# Patient Record
Sex: Female | Born: 1955
Health system: Southern US, Community
[De-identification: ages and names within clinical notes are randomized; demographics above are authoritative.]

## PROBLEM LIST (undated history)

## (undated) DIAGNOSIS — C801 Malignant (primary) neoplasm, unspecified: Secondary | ICD-10-CM

## (undated) DIAGNOSIS — M75102 Unspecified rotator cuff tear or rupture of left shoulder, not specified as traumatic: Secondary | ICD-10-CM

## (undated) DIAGNOSIS — R112 Nausea with vomiting, unspecified: Secondary | ICD-10-CM

## (undated) DIAGNOSIS — Z808 Family history of malignant neoplasm of other organs or systems: Secondary | ICD-10-CM

## (undated) DIAGNOSIS — T4145XA Adverse effect of unspecified anesthetic, initial encounter: Secondary | ICD-10-CM

## (undated) DIAGNOSIS — R011 Cardiac murmur, unspecified: Secondary | ICD-10-CM

## (undated) DIAGNOSIS — G8929 Other chronic pain: Secondary | ICD-10-CM

## (undated) DIAGNOSIS — K219 Gastro-esophageal reflux disease without esophagitis: Secondary | ICD-10-CM

## (undated) DIAGNOSIS — R42 Dizziness and giddiness: Secondary | ICD-10-CM

## (undated) DIAGNOSIS — Z8489 Family history of other specified conditions: Secondary | ICD-10-CM

## (undated) DIAGNOSIS — Z8719 Personal history of other diseases of the digestive system: Secondary | ICD-10-CM

## (undated) DIAGNOSIS — E785 Hyperlipidemia, unspecified: Secondary | ICD-10-CM

## (undated) DIAGNOSIS — J302 Other seasonal allergic rhinitis: Secondary | ICD-10-CM

## (undated) DIAGNOSIS — Z800 Family history of malignant neoplasm of digestive organs: Secondary | ICD-10-CM

## (undated) DIAGNOSIS — Z9889 Other specified postprocedural states: Secondary | ICD-10-CM

## (undated) DIAGNOSIS — I10 Essential (primary) hypertension: Secondary | ICD-10-CM

## (undated) DIAGNOSIS — M549 Dorsalgia, unspecified: Secondary | ICD-10-CM

## (undated) DIAGNOSIS — M1990 Unspecified osteoarthritis, unspecified site: Secondary | ICD-10-CM

## (undated) DIAGNOSIS — Z1379 Encounter for other screening for genetic and chromosomal anomalies: Principal | ICD-10-CM

## (undated) DIAGNOSIS — T8859XA Other complications of anesthesia, initial encounter: Secondary | ICD-10-CM

## (undated) DIAGNOSIS — D0511 Intraductal carcinoma in situ of right breast: Principal | ICD-10-CM

## (undated) DIAGNOSIS — Z8041 Family history of malignant neoplasm of ovary: Secondary | ICD-10-CM

## (undated) HISTORY — DX: Gastro-esophageal reflux disease without esophagitis: K21.9

## (undated) HISTORY — DX: Unspecified rotator cuff tear or rupture of left shoulder, not specified as traumatic: M75.102

## (undated) HISTORY — DX: Family history of malignant neoplasm of digestive organs: Z80.0

## (undated) HISTORY — DX: Intraductal carcinoma in situ of right breast: D05.11

## (undated) HISTORY — DX: Encounter for other screening for genetic and chromosomal anomalies: Z13.79

## (undated) HISTORY — DX: Other specified postprocedural states: Z98.890

## (undated) HISTORY — PX: BACK SURGERY: SHX140

## (undated) HISTORY — PX: HERNIA REPAIR: SHX51

## (undated) HISTORY — PX: MELANOMA EXCISION: SHX5266

## (undated) HISTORY — PX: HAND SURGERY: SHX662

## (undated) HISTORY — DX: Family history of malignant neoplasm of other organs or systems: Z80.8

## (undated) HISTORY — PX: EYE SURGERY: SHX253

## (undated) HISTORY — DX: Family history of malignant neoplasm of ovary: Z80.41

## (undated) HISTORY — DX: Hyperlipidemia, unspecified: E78.5

## (undated) HISTORY — PX: ABDOMINAL HYSTERECTOMY: SHX81

## (undated) HISTORY — PX: FOOT SURGERY: SHX648

## (undated) HISTORY — PX: BREAST SURGERY: SHX581

## (undated) HISTORY — PX: COLONOSCOPY: SHX174

---

## 1984-09-24 HISTORY — PX: TUBAL LIGATION: SHX77

## 1998-12-26 ENCOUNTER — Other Ambulatory Visit: Admission: RE | Admit: 1998-12-26 | Discharge: 1998-12-26 | Payer: Self-pay | Admitting: Obstetrics and Gynecology

## 1999-07-03 ENCOUNTER — Encounter: Payer: Self-pay | Admitting: Family Medicine

## 1999-07-03 ENCOUNTER — Ambulatory Visit (HOSPITAL_COMMUNITY): Admission: RE | Admit: 1999-07-03 | Discharge: 1999-07-03 | Payer: Self-pay | Admitting: Family Medicine

## 1999-12-28 ENCOUNTER — Other Ambulatory Visit: Admission: RE | Admit: 1999-12-28 | Discharge: 1999-12-28 | Payer: Self-pay | Admitting: Obstetrics and Gynecology

## 2000-05-31 ENCOUNTER — Encounter: Admission: RE | Admit: 2000-05-31 | Discharge: 2000-05-31 | Payer: Self-pay | Admitting: Obstetrics and Gynecology

## 2000-05-31 ENCOUNTER — Encounter: Payer: Self-pay | Admitting: Obstetrics and Gynecology

## 2000-12-30 ENCOUNTER — Other Ambulatory Visit: Admission: RE | Admit: 2000-12-30 | Discharge: 2000-12-30 | Payer: Self-pay | Admitting: Obstetrics and Gynecology

## 2001-06-09 ENCOUNTER — Encounter: Payer: Self-pay | Admitting: Obstetrics and Gynecology

## 2001-06-09 ENCOUNTER — Encounter: Admission: RE | Admit: 2001-06-09 | Discharge: 2001-06-09 | Payer: Self-pay | Admitting: Obstetrics and Gynecology

## 2002-01-19 ENCOUNTER — Other Ambulatory Visit: Admission: RE | Admit: 2002-01-19 | Discharge: 2002-01-19 | Payer: Self-pay | Admitting: Obstetrics and Gynecology

## 2002-06-12 ENCOUNTER — Encounter: Admission: RE | Admit: 2002-06-12 | Discharge: 2002-06-12 | Payer: Self-pay | Admitting: Obstetrics and Gynecology

## 2002-06-12 ENCOUNTER — Encounter: Payer: Self-pay | Admitting: Obstetrics and Gynecology

## 2002-11-06 ENCOUNTER — Encounter: Admission: RE | Admit: 2002-11-06 | Discharge: 2002-11-06 | Payer: Self-pay | Admitting: Obstetrics and Gynecology

## 2002-11-06 ENCOUNTER — Encounter: Payer: Self-pay | Admitting: Obstetrics and Gynecology

## 2003-06-22 ENCOUNTER — Encounter: Admission: RE | Admit: 2003-06-22 | Discharge: 2003-06-22 | Payer: Self-pay | Admitting: Obstetrics and Gynecology

## 2003-06-22 ENCOUNTER — Encounter: Payer: Self-pay | Admitting: Obstetrics and Gynecology

## 2004-06-16 ENCOUNTER — Ambulatory Visit (HOSPITAL_COMMUNITY): Admission: RE | Admit: 2004-06-16 | Discharge: 2004-06-16 | Payer: Self-pay | Admitting: Gastroenterology

## 2006-06-11 ENCOUNTER — Other Ambulatory Visit: Admission: RE | Admit: 2006-06-11 | Discharge: 2006-06-11 | Payer: Self-pay | Admitting: Obstetrics & Gynecology

## 2007-06-17 HISTORY — PX: OTHER SURGICAL HISTORY: SHX169

## 2007-07-03 ENCOUNTER — Other Ambulatory Visit: Admission: RE | Admit: 2007-07-03 | Discharge: 2007-07-03 | Payer: Self-pay | Admitting: Obstetrics & Gynecology

## 2008-07-07 ENCOUNTER — Other Ambulatory Visit: Admission: RE | Admit: 2008-07-07 | Discharge: 2008-07-07 | Payer: Self-pay | Admitting: Obstetrics & Gynecology

## 2008-07-20 ENCOUNTER — Emergency Department (HOSPITAL_BASED_OUTPATIENT_CLINIC_OR_DEPARTMENT_OTHER): Admission: EM | Admit: 2008-07-20 | Discharge: 2008-07-20 | Payer: Self-pay | Admitting: Emergency Medicine

## 2009-07-08 ENCOUNTER — Encounter: Admission: RE | Admit: 2009-07-08 | Discharge: 2009-07-08 | Payer: Self-pay | Admitting: Family Medicine

## 2010-01-04 ENCOUNTER — Encounter: Admission: RE | Admit: 2010-01-04 | Discharge: 2010-01-04 | Payer: Self-pay | Admitting: Gastroenterology

## 2010-04-24 DIAGNOSIS — C801 Malignant (primary) neoplasm, unspecified: Secondary | ICD-10-CM

## 2010-04-24 HISTORY — DX: Malignant (primary) neoplasm, unspecified: C80.1

## 2011-02-09 NOTE — Op Note (Signed)
Melissa Mcclure, Melissa Mcclure                  ACCOUNT NO.:  [id number]   MEDICAL RECORD NO.:  [id number]          PATIENT TYPE:  AMB   LOCATION:  ENDO                         FACILITY:  [facility]   PHYSICIAN:  Petra Kuba, M.D.    DATE OF BIRTH:  August 11, 1956   DATE OF PROCEDURE:  06/16/2004  DATE OF DISCHARGE:                                 OPERATIVE REPORT   PROCEDURE:  Colonoscopy.   INDICATION:  Family history of colon cancer, due for colonic screening.  Consent was signed after risks, benefits, methods, options thoroughly  discussed in the office.   MEDICINES USED:  1.  Demerol 80.  2.  Versed 8.   DESCRIPTION OF PROCEDURE:  Rectal inspection was pertinent for small  external hemorrhoids.  Digital exam was negative.  Pediatric video  adjustable colonoscope was inserted and with lots of difficulty due to a  long, looping colon which required rolling her first on her back, then on  her right side, and back to her left side, with various abdominal pressures,  we were finally able to get to the cecum which was identified by the  appendiceal orifice and the ileocecal valve.  No abnormalities were seen on  insertion, but the prep was fairly poor.  We did advance a very short ways  into the terminal ileum with quick evaluation.  TI ws normal.  Scope was  slowly withdrawn.  Lots of washing and suctioning was done, but there was  some stool adherent to the wall, right greater than left which could not be  washed off but no obvious underlying mass, lesions, or polyps were seen as  we slowly withdrawn back to the rectum.  There was some stool on the left  side which could be washed in different positions but could not be  suctioned, but no abnormalities were seen.  Once back in the rectum,  anorectal pull-through and retroflexion confirmed some small hemorrhoids.  The scope was reinserted a short ways up the left side of the colon; air was  suctioned and the scope removed.  The patient tolerated the  procedure well.  There was no obvious immediate complication.   ENDOSCOPIC DIAGNOSES:  1.  Internal/external hemorrhoids.  2.  Long, looping colon.  3.  Left greater than right but no obvious lesions plan.   PLAN:  1.  Yearly rectals and guaiacs per either Dr. Dayton Scrape or gynecology.  2.  Happy to see back p.r.n..  3.  Repeat screening in 5 years using a more aggressive prep like mag      citrate and GoLYTELY.  Might even consider a virtual colonoscopy one      time at some point if the procedure is widely available and acceptable      by insurance.      MEM/MEDQ  D:  06/16/2004  T:  06/16/2004  Job:  [id number]   cc:   Meredith Staggers, M.D.  [address]  [place]  [place] [address]  Fax: [phone]

## 2012-08-11 ENCOUNTER — Other Ambulatory Visit: Payer: Self-pay | Admitting: Neurosurgery

## 2012-08-11 DIAGNOSIS — M48061 Spinal stenosis, lumbar region without neurogenic claudication: Secondary | ICD-10-CM

## 2012-08-11 DIAGNOSIS — M5126 Other intervertebral disc displacement, lumbar region: Secondary | ICD-10-CM

## 2012-08-13 ENCOUNTER — Other Ambulatory Visit: Payer: Self-pay | Admitting: Neurosurgery

## 2012-08-13 DIAGNOSIS — M810 Age-related osteoporosis without current pathological fracture: Secondary | ICD-10-CM

## 2012-08-16 ENCOUNTER — Ambulatory Visit
Admission: RE | Admit: 2012-08-16 | Discharge: 2012-08-16 | Disposition: A | Discharge status: Home / Self Care | Payer: BC Managed Care – PPO | Source: Ambulatory Visit | Attending: Neurosurgery | Admitting: Neurosurgery

## 2012-08-16 DIAGNOSIS — M48061 Spinal stenosis, lumbar region without neurogenic claudication: Secondary | ICD-10-CM

## 2012-08-16 DIAGNOSIS — M5126 Other intervertebral disc displacement, lumbar region: Secondary | ICD-10-CM

## 2012-08-20 ENCOUNTER — Ambulatory Visit
Admission: RE | Admit: 2012-08-20 | Discharge: 2012-08-20 | Disposition: A | Discharge status: Home / Self Care | Payer: BC Managed Care – PPO | Source: Ambulatory Visit | Attending: Neurosurgery | Admitting: Neurosurgery

## 2012-08-20 DIAGNOSIS — M810 Age-related osteoporosis without current pathological fracture: Secondary | ICD-10-CM

## 2012-09-22 ENCOUNTER — Other Ambulatory Visit: Payer: Self-pay | Admitting: Neurosurgery

## 2013-04-06 ENCOUNTER — Encounter (HOSPITAL_COMMUNITY): Payer: Self-pay | Admitting: Respiratory Therapy

## 2013-04-08 ENCOUNTER — Other Ambulatory Visit: Payer: Self-pay | Admitting: Neurosurgery

## 2013-04-09 ENCOUNTER — Encounter (HOSPITAL_COMMUNITY)
Admission: RE | Admit: 2013-04-09 | Discharge: 2013-04-09 | Disposition: A | Discharge status: Home / Self Care | Payer: BC Managed Care – PPO | Source: Ambulatory Visit | Attending: Neurosurgery | Admitting: Neurosurgery

## 2013-04-09 ENCOUNTER — Encounter (HOSPITAL_COMMUNITY): Payer: Self-pay

## 2013-04-09 ENCOUNTER — Encounter (HOSPITAL_COMMUNITY)
Admission: RE | Admit: 2013-04-09 | Discharge: 2013-04-09 | Disposition: A | Discharge status: Home / Self Care | Payer: BC Managed Care – PPO | Source: Ambulatory Visit | Attending: Anesthesiology | Admitting: Anesthesiology

## 2013-04-09 DIAGNOSIS — Z01818 Encounter for other preprocedural examination: Secondary | ICD-10-CM | POA: Insufficient documentation

## 2013-04-09 DIAGNOSIS — Z01812 Encounter for preprocedural laboratory examination: Secondary | ICD-10-CM | POA: Insufficient documentation

## 2013-04-09 DIAGNOSIS — Z01811 Encounter for preprocedural respiratory examination: Secondary | ICD-10-CM | POA: Insufficient documentation

## 2013-04-09 DIAGNOSIS — Z01810 Encounter for preprocedural cardiovascular examination: Secondary | ICD-10-CM | POA: Insufficient documentation

## 2013-04-09 HISTORY — DX: Essential (primary) hypertension: I10

## 2013-04-09 HISTORY — DX: Other complications of anesthesia, initial encounter: T88.59XA

## 2013-04-09 HISTORY — DX: Personal history of other diseases of the digestive system: Z87.19

## 2013-04-09 HISTORY — DX: Gastro-esophageal reflux disease without esophagitis: K21.9

## 2013-04-09 HISTORY — DX: Malignant (primary) neoplasm, unspecified: C80.1

## 2013-04-09 HISTORY — DX: Adverse effect of unspecified anesthetic, initial encounter: T41.45XA

## 2013-04-09 HISTORY — DX: Other specified postprocedural states: Z98.890

## 2013-04-09 HISTORY — DX: Nausea with vomiting, unspecified: R11.2

## 2013-04-09 HISTORY — DX: Cardiac murmur, unspecified: R01.1

## 2013-04-09 HISTORY — DX: Dizziness and giddiness: R42

## 2013-04-09 LAB — BASIC METABOLIC PANEL
BUN: 25 mg/dL — ABNORMAL HIGH (ref 6–23)
Chloride: 98 mEq/L (ref 96–112)
GFR calc Af Amer: 72 mL/min — ABNORMAL LOW (ref >90)
Potassium: 3.6 mEq/L (ref 3.5–5.1)
Sodium: 136 mEq/L (ref 135–145)

## 2013-04-09 LAB — SURGICAL PCR SCREEN: MRSA, PCR: NEGATIVE

## 2013-04-09 LAB — ABO/RH: ABO/RH(D): O POS

## 2013-04-09 LAB — CBC
HCT: 36.3 % (ref 36.0–46.0)
Hemoglobin: 12.8 g/dL (ref 12.0–15.0)
MCHC: 35.3 g/dL (ref 30.0–36.0)
MCV: 90.3 fL (ref 78.0–100.0)
RBC: 4.02 MIL/uL (ref 3.87–5.11)
RDW: 12.2 % (ref 11.5–15.5)
WBC: 6.2 10*3/uL (ref 4.0–10.5)

## 2013-04-09 LAB — TYPE AND SCREEN: Antibody Screen: NEGATIVE

## 2013-04-09 NOTE — Pre-Procedure Instructions (Addendum)
Melissa Mcclure  04/09/2013   Your procedure is scheduled on:  04/17/13  Report to [facility] at 530 AM.  Call this number if you have problems the morning of surgery: [phone]   Remember:   Do not eat food or drink liquids after midnight.   Take these medicines the morning of surgery with A SIP OF WATER:pepcid, /protonix, pain med, progesterone             STOP aspirin, meloxicam, vit e, astragalus   Do not wear jewelry, make-up or nail polish.  Do not wear lotions, powders, or perfumes. You may wear deodorant.  Do not shave 48 hours prior to surgery. Men may shave face and neck.  Do not bring valuables to the hospital.  [facility] is not responsible                   for any belongings or valuables.  Contacts, dentures or bridgework may not be worn into surgery.  Leave suitcase in the car. After surgery it may be brought to your room.  For patients admitted to the hospital, checkout time is 11:00 AM the day of  discharge.   Patients discharged the day of surgery will not be allowed to drive  home.  Name and phone number of your driver:   Special Instructions: Shower using CHG 2 nights before surgery and the night before surgery.  If you shower the day of surgery use CHG.  Use special wash - you have one bottle of CHG for all showers.  You should use approximately 1/3 of the bottle for each shower.   Please read over the following fact sheets that you were given: Pain Booklet, Coughing and Deep Breathing, Blood Transfusion Information, MRSA Information and Surgical Site Infection Prevention

## 2013-04-09 NOTE — Progress Notes (Signed)
Office called to release orders 

## 2013-04-13 NOTE — Progress Notes (Signed)
Pt called today asking if it was ok to keep her Vivelle Dot patch on the day of surgery. Told her that she could keep it on.

## 2013-04-16 MED ORDER — CEFAZOLIN SODIUM-DEXTROSE 2-3 GM-% IV SOLR
2.0000 g | INTRAVENOUS | Status: AC
  Administered 2013-04-17 (×2): 2 g via INTRAVENOUS
  Filled 2013-04-16: qty 50

## 2013-04-16 NOTE — H&P (Signed)
Melissa Mcclure  #[id number] DOB:  1955-12-31 11/26/2012:     Melissa Mcclure comes in today for recheck and to discuss her back problems and proposed surgery.  We recommended that she undergo anterolateral lumbar interbody fusion L2-3, L3-4, L4-5 with posterior interbody fusion L5-S1 with percutaneous pedicle screw fixation for scoliosis and severe right greater than left lower extremity pain.  She says she is hurting badly and wants to get relief of her pain.  She has Grade 1 spondylolisthesis at L5-S1 due to spondylolysis and there is spondylolisthesis L4-5 and L3-4 and mild retrolisthesis of L1 and L2 with scoliotic curvature.  I went over the treatment options with a detailed discussion of the proposed surgery as well as its attendant risks and benefits.  She wishes to proceed.  This is being schedule for 04/03/2013 based on personal need to wait until July.           [name]. [name], M.D./gde   [name]  #[id number]  DOB:  October 27, 1955  09/08/2012:  Melissa Mcclure comes in today to review her lumbar MRI and radiographs and also bone density that was acceptable and within normal range.  The lumbar MRI and radiographs were reviewed which show that she has significant scoliosis affecting L2-3, L3-4, L4-5 and L5-S1 levels.  I have recommended that she undergo a right-sided anterolateral decompression and fusion approach L2-3, L3-4 and L4-5 levels with L5-S1 decompression and fusion with percutaneous pedicle screw fixation for severe scoliosis and low back and right greater than left lower extremity pain.  She has significant foraminal stenosis at the L5-S1 level with marked degeneration at this level as well.  I believe this needs to be addressed at the same time.  She wishes to go ahead with surgery.  She will be fitted for an LSO brace and surgery is to be scheduled in February.  Risks and benefits were discussed in great detail and models were reviewed.  The patient understands and wishes to proceed.          [name]. [name],  M.D./sv     NEUROSURGICAL CONSULTATION   [name]   DOB:  [birth year] #[id number]    July 28, 2012   HISTORY:     Melissa Mcclure is a 57 year old woman with back and bilateral leg pain, right greater than left.  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  She says this has been going on for years, but has gotten worse recently and is now increasingly a problem.  She saw Dr. Shelle Iron who referred her to [name], and then went for a second opinion and [name] in 2008.  She has been taking Mobic 15 mg. every other day, Hydrocodone 7.5/325 one at night as needed, Tylenol 500 mg. two as needed.  She had epidural steroid injections twice this year which did not give her any relief.    REVIEW OF SYSTEMS:   A detailed Review of Systems sheet was reviewed with the patient.  Pertinent positives include under cardiovascular - she notes high blood pressure, under musculoskeletal - she notes back pain and leg pain.  All other systems are negative; this includes Constitutional symptoms, Eyes, Ears, nose, mouth, throat, Endocrine, Respiratory, Gastrointestinal, Genitourinary, Integumentary & Breast, Neurologic, Psychiatric, Hematologic/Lymphatic, Allergic/Immunologic.    PAST MEDICAL HISTORY:      Current Medical Conditions:    She has a history of hypertension.  Prior Operations and Hospitalizations:   She has a history of Stage IV melanoma treated at [facility] in 2008.      Medications and Allergies:  Medications are Meloxicam 15 mg. one every other day, Vivelle Dot 0.0375 mg. twice weekly, Pantoprazole 40 mg. qd, Metoprolol 50 mg. qd, Progesterone 100 mg. qd, Meclizine 25 mg. qd prn, Hydrocodone 7.5/325 po q6-8h prn, Vitamin B12 1000 mcg. qd, Calcium and Magnesium vitamin qd, Alive multivitamin qd, Astragalus 500 mg. qd, Zyrtec qd, Vitamin E 400 qd, Vitamin D3 5000 IU qd, Vitamin C 1000 with rose hips qd, and low  dose Aspirin 81 mg. qd.  DRUG ALLERGIES - SULFA which causes her to break out.      Height and Weight:     She is 5'4 " tall, 134 lbs.    SOCIAL HISTORY:    She denies tobacco or drug use.  She is a rare drinker of alcoholic beverages.    DIAGNOSTIC STUDIES:   She had plain radiographs in the office which show L5-S1 spondylolisthesis and severe scoliosis of the thoracolumbar spine.  Apex of the curvature at L3-4.    MRI had been performed in January of 2013 which shows Grade I spondylolisthesis of L5 on S1 due to bilateral spondylolysis. At L4-5 there is positioning of L4 on L5.  At L3-4 there is again retrolisthesis of L3 on L4 with small to moderate right lateral and posterolateral protrusion. At L2-3 there is retrolisthesis of L2 on L3. At L1-2 there is mild retrolisthesis of L1 on L2 without stenosis or nerve root displacement.    PHYSICAL EXAMINATION:      General Appearance:   On examination today, [name] is a pleasant and cooperative woman in no acute distress.      Blood Pressure, Pulse:     Blood pressure is 140/88.  Heart rate is 72 and regular.  Respiratory rate is 18.      HEENT - normocephalic, atraumatic.  The pupils are equal, round and reactive to light.  The extraocular muscles are intact.  Sclerae - white.  Conjunctiva - pink.  Oropharynx benign.  Uvula midline.     Neck - there are no masses, meningismus, deformities, tracheal deviation, jugular vein distention or carotid bruits.  There is normal cervical range of motion.  Spurlings' test is negative without reproducible radicular pain turning the patient's head to either side.  Lhermitte's sign is not present with axial compression.      Respiratory - there is normal respiratory effort with good intercostal function.  Lungs are clear to auscultation.  There are no rales, rhonchi or wheezes.      Cardiovascular - the heart has regular rate and rhythm to auscultation.  No murmurs are appreciated.  There is no extremity  edema, cyanosis or clubbing.  There are palpable pedal pulses.      Abdomen - soft, nontender, no hepatosplenomegaly appreciated or masses.  There are active bowel sounds.  No guarding or rebound.      Musculoskeletal Examination - she has a palpable dextroconvex scoliotic curvature in the thoracic spine. She has sciatic notch discomfort on the right. She is able to stand on her heels and toes, and able to bend to touch her toes.  Straight leg raising is positive for low back pain.    NEUROLOGICAL EXAMINATION: The patient is oriented to time, person and place and has good recall of both recent and remote memory with normal attention span and concentration.  The patient  speaks with clear and fluent speech and exhibits normal language function and appropriate fund of knowledge.      Cranial Nerve Examination - pupils are equal, round and reactive to light.  Extraocular movements are full.  Visual fields are full to confrontational testing.  Facial sensation and facial movement are symmetric and intact.  Hearing is intact to finger rub.  Palate is upgoing.  Shoulder shrug is symmetric.  Tongue protrudes in the midline.      Motor Examination - motor strength is 5/5 in the bilateral deltoids, biceps, triceps, handgrips, wrist extensors, interosseous.  In the lower extremities motor strength is 5/5 in hip flexion, extension, quadriceps, hamstrings, plantar flexion, dorsiflexion and extensor hallucis longus.      Sensory Examination - normal to light touch and pinprick sensation in the upper and lower extremities.     Deep Tendon Reflexes - 2 in the biceps, triceps, and brachioradialis, 2 in the knees, 2 in the ankles.  The great toes are downgoing to plantar stimulation.      Cerebellar Examination - normal coordination in upper and lower extremities and normal rapid alternating movements.  Romberg test is negative.    IMPRESSION AND RECOMMENDATIONS: Daielle Melcher is a 57 year old woman with multilevel  lumbar stenosis and spondylolisthesis with scoliosis. I have recommended we get a new MRI of the lumbar spine and scoliosis series, and also would recommend bone density study to see if the bones are strong enough to perform reconstructive surgery.  My concern is that [name] had recommended L5-S1 decompression and fusion; however, I think that some of her pain may relate to the L3-4 level.  There is significant scoliosis. I am concerned that a more reduced surgical intervention would not necessarily relieve her radicular pain. I will make further recommendations after the new MRI has been performed.   [facility]   [name]. [name], M.D.  JDS:aft

## 2013-04-17 ENCOUNTER — Inpatient Hospital Stay (HOSPITAL_COMMUNITY): Payer: BC Managed Care – PPO | Admitting: Anesthesiology

## 2013-04-17 ENCOUNTER — Inpatient Hospital Stay (HOSPITAL_COMMUNITY): Payer: BC Managed Care – PPO

## 2013-04-17 ENCOUNTER — Encounter (HOSPITAL_COMMUNITY)
Admission: RE | Disposition: A | Discharge status: Home Health | Payer: Self-pay | Source: Ambulatory Visit | Attending: Neurosurgery

## 2013-04-17 ENCOUNTER — Encounter (HOSPITAL_COMMUNITY): Payer: Self-pay | Admitting: Anesthesiology

## 2013-04-17 ENCOUNTER — Inpatient Hospital Stay (HOSPITAL_COMMUNITY)
Admission: RE | Admit: 2013-04-17 | Discharge: 2013-04-22 | DRG: 807 | Disposition: A | Discharge status: Home Health | Payer: BC Managed Care – PPO | Source: Ambulatory Visit | Attending: Neurosurgery | Admitting: Neurosurgery

## 2013-04-17 ENCOUNTER — Encounter (HOSPITAL_COMMUNITY): Payer: Self-pay | Admitting: Surgery

## 2013-04-17 DIAGNOSIS — I10 Essential (primary) hypertension: Secondary | ICD-10-CM | POA: Diagnosis present

## 2013-04-17 DIAGNOSIS — Q762 Congenital spondylolisthesis: Secondary | ICD-10-CM

## 2013-04-17 DIAGNOSIS — Z85820 Personal history of malignant melanoma of skin: Secondary | ICD-10-CM

## 2013-04-17 DIAGNOSIS — Z7982 Long term (current) use of aspirin: Secondary | ICD-10-CM

## 2013-04-17 DIAGNOSIS — Z79899 Other long term (current) drug therapy: Secondary | ICD-10-CM

## 2013-04-17 DIAGNOSIS — M4120 Other idiopathic scoliosis, site unspecified: Principal | ICD-10-CM | POA: Diagnosis present

## 2013-04-17 HISTORY — PX: ANTERIOR LAT LUMBAR FUSION: SHX1168

## 2013-04-17 HISTORY — PX: LUMBAR PERCUTANEOUS PEDICLE SCREW 3 LEVEL: SHX5562

## 2013-04-17 SURGERY — ANTERIOR LATERAL LUMBAR FUSION 3 LEVELS
Anesthesia: General | Laterality: Right | Wound class: Clean

## 2013-04-17 MED ORDER — KCL IN DEXTROSE-NACL 20-5-0.45 MEQ/L-%-% IV SOLN
INTRAVENOUS | Status: DC
  Administered 2013-04-17 – 2013-04-19 (×2): via INTRAVENOUS
  Filled 2013-04-17 (×11): qty 1000

## 2013-04-17 MED ORDER — VITAMIN B-12 1000 MCG PO TABS
1000.0000 ug | ORAL_TABLET | Freq: Every day | ORAL | Status: DC
  Administered 2013-04-18 – 2013-04-22 (×5): 1000 ug via ORAL
  Filled 2013-04-17 (×6): qty 1

## 2013-04-17 MED ORDER — HYDROMORPHONE HCL PF 1 MG/ML IJ SOLN
0.2500 mg | INTRAMUSCULAR | Status: DC | PRN
  Administered 2013-04-17: 1 mg via INTRAVENOUS
  Administered 2013-04-17: 0.5 mg via INTRAVENOUS

## 2013-04-17 MED ORDER — BISACODYL 10 MG RE SUPP
10.0000 mg | Freq: Every day | RECTAL | Status: DC | PRN
  Administered 2013-04-20: 10 mg via RECTAL
  Filled 2013-04-17: qty 1

## 2013-04-17 MED ORDER — SODIUM CHLORIDE 0.9 % IV SOLN
INTRAVENOUS | Status: AC
  Filled 2013-04-17: qty 500

## 2013-04-17 MED ORDER — OXYCODONE-ACETAMINOPHEN 5-325 MG PO TABS
ORAL_TABLET | ORAL | Status: AC
  Filled 2013-04-17: qty 2

## 2013-04-17 MED ORDER — ONDANSETRON HCL 4 MG/2ML IJ SOLN
INTRAMUSCULAR | Status: DC | PRN
  Administered 2013-04-17: 4 mg via INTRAVENOUS

## 2013-04-17 MED ORDER — ROCURONIUM BROMIDE 100 MG/10ML IV SOLN
INTRAVENOUS | Status: DC | PRN
  Administered 2013-04-17: 15 mg via INTRAVENOUS
  Administered 2013-04-17: 10 mg via INTRAVENOUS
  Administered 2013-04-17: 30 mg via INTRAVENOUS
  Administered 2013-04-17: 20 mg via INTRAVENOUS
  Administered 2013-04-17: 35 mg via INTRAVENOUS

## 2013-04-17 MED ORDER — PANTOPRAZOLE SODIUM 40 MG IV SOLR
40.0000 mg | Freq: Every day | INTRAVENOUS | Status: DC
  Administered 2013-04-17 – 2013-04-18 (×2): 40 mg via INTRAVENOUS
  Filled 2013-04-17 (×3): qty 40

## 2013-04-17 MED ORDER — HYDROMORPHONE HCL PF 1 MG/ML IJ SOLN
INTRAMUSCULAR | Status: DC | PRN
  Administered 2013-04-17: 0.5 mg via INTRAVENOUS

## 2013-04-17 MED ORDER — HYDROCHLOROTHIAZIDE 12.5 MG PO CAPS
12.5000 mg | ORAL_CAPSULE | Freq: Every day | ORAL | Status: DC
  Administered 2013-04-17 – 2013-04-22 (×2): 12.5 mg via ORAL
  Filled 2013-04-17 (×6): qty 1

## 2013-04-17 MED ORDER — PROPOFOL 10 MG/ML IV BOLUS
INTRAVENOUS | Status: DC | PRN
  Administered 2013-04-17: 130 mg via INTRAVENOUS

## 2013-04-17 MED ORDER — GLYCOPYRROLATE 0.2 MG/ML IJ SOLN
INTRAMUSCULAR | Status: DC | PRN
  Administered 2013-04-17: 0.6 mg via INTRAVENOUS

## 2013-04-17 MED ORDER — BACITRACIN 50000 UNITS IM SOLR
INTRAMUSCULAR | Status: AC
  Filled 2013-04-17: qty 1

## 2013-04-17 MED ORDER — PHENYLEPHRINE HCL 10 MG/ML IJ SOLN
10.0000 mg | INTRAVENOUS | Status: DC | PRN
  Administered 2013-04-17: 10 ug/min via INTRAVENOUS

## 2013-04-17 MED ORDER — THROMBIN 20000 UNITS EX SOLR
CUTANEOUS | Status: DC | PRN
  Administered 2013-04-17: 11:00:00 via TOPICAL

## 2013-04-17 MED ORDER — DEXAMETHASONE SODIUM PHOSPHATE 4 MG/ML IJ SOLN
INTRAMUSCULAR | Status: DC | PRN
  Administered 2013-04-17: 4 mg via INTRAVENOUS

## 2013-04-17 MED ORDER — ONDANSETRON HCL 4 MG/2ML IJ SOLN
4.0000 mg | Freq: Once | INTRAMUSCULAR | Status: AC | PRN
  Administered 2013-04-17: 4 mg via INTRAVENOUS

## 2013-04-17 MED ORDER — DOCUSATE SODIUM 100 MG PO CAPS
100.0000 mg | ORAL_CAPSULE | Freq: Two times a day (BID) | ORAL | Status: DC
  Administered 2013-04-17 – 2013-04-22 (×10): 100 mg via ORAL
  Filled 2013-04-17 (×8): qty 1

## 2013-04-17 MED ORDER — SENNOSIDES-DOCUSATE SODIUM 8.6-50 MG PO TABS
1.0000 | ORAL_TABLET | Freq: Every evening | ORAL | Status: DC | PRN
  Administered 2013-04-18: 1 via ORAL
  Filled 2013-04-17: qty 1

## 2013-04-17 MED ORDER — ACETAMINOPHEN 650 MG RE SUPP: 650.0000 mg | RECTAL | Status: DC | PRN

## 2013-04-17 MED ORDER — MIDAZOLAM HCL 5 MG/5ML IJ SOLN
INTRAMUSCULAR | Status: DC | PRN
  Administered 2013-04-17 (×2): 1 mg via INTRAVENOUS

## 2013-04-17 MED ORDER — OLMESARTAN MEDOXOMIL-HCTZ 20-12.5 MG PO TABS: 1.0000 | ORAL_TABLET | Freq: Every day | ORAL | Status: DC

## 2013-04-17 MED ORDER — HYDROMORPHONE HCL PF 1 MG/ML IJ SOLN
0.5000 mg | INTRAMUSCULAR | Status: DC | PRN
  Administered 2013-04-22: 1 mg via INTRAVENOUS
  Filled 2013-04-17: qty 1

## 2013-04-17 MED ORDER — CALCIUM CARBONATE ANTACID 500 MG PO CHEW
3.0000 | CHEWABLE_TABLET | Freq: Every day | ORAL | Status: DC
  Administered 2013-04-17 – 2013-04-21 (×5): 600 mg via ORAL
  Filled 2013-04-17 (×6): qty 3

## 2013-04-17 MED ORDER — FLEET ENEMA 7-19 GM/118ML RE ENEM: 1.0000 | ENEMA | Freq: Once | RECTAL | Status: AC | PRN

## 2013-04-17 MED ORDER — ONDANSETRON HCL 4 MG/2ML IJ SOLN
4.0000 mg | INTRAMUSCULAR | Status: DC | PRN
  Administered 2013-04-18 – 2013-04-22 (×2): 4 mg via INTRAVENOUS
  Filled 2013-04-17 (×2): qty 2

## 2013-04-17 MED ORDER — ACETAMINOPHEN 325 MG PO TABS: 650.0000 mg | ORAL_TABLET | ORAL | Status: DC | PRN

## 2013-04-17 MED ORDER — MELOXICAM 15 MG PO TABS
15.0000 mg | ORAL_TABLET | ORAL | Status: DC
  Filled 2013-04-17 (×2): qty 1

## 2013-04-17 MED ORDER — SODIUM CHLORIDE 0.9 % IJ SOLN: 3.0000 mL | INTRAMUSCULAR | Status: DC | PRN

## 2013-04-17 MED ORDER — 0.9 % SODIUM CHLORIDE (POUR BTL) OPTIME
TOPICAL | Status: DC | PRN
  Administered 2013-04-17: 1000 mL

## 2013-04-17 MED ORDER — LIDOCAINE HCL (CARDIAC) 20 MG/ML IV SOLN
INTRAVENOUS | Status: DC | PRN
  Administered 2013-04-17: 50 mg via INTRAVENOUS

## 2013-04-17 MED ORDER — HYDROMORPHONE HCL PF 1 MG/ML IJ SOLN
INTRAMUSCULAR | Status: AC
  Filled 2013-04-17: qty 1

## 2013-04-17 MED ORDER — HYDROCODONE-ACETAMINOPHEN 10-325 MG PO TABS: 1.0000 | ORAL_TABLET | Freq: Four times a day (QID) | ORAL | Status: DC | PRN

## 2013-04-17 MED ORDER — NEOSTIGMINE METHYLSULFATE 1 MG/ML IJ SOLN
INTRAMUSCULAR | Status: DC | PRN
  Administered 2013-04-17: 4 mg via INTRAVENOUS

## 2013-04-17 MED ORDER — ONDANSETRON HCL 4 MG/2ML IJ SOLN
INTRAMUSCULAR | Status: AC
  Administered 2013-04-17: 4 mg
  Filled 2013-04-17: qty 2

## 2013-04-17 MED ORDER — LIDOCAINE-EPINEPHRINE 1 %-1:100000 IJ SOLN
INTRAMUSCULAR | Status: DC | PRN
  Administered 2013-04-17: 12 mL via INTRADERMAL
  Administered 2013-04-17: 5 mL via INTRADERMAL

## 2013-04-17 MED ORDER — SENNA 8.6 MG PO TABS
1.0000 | ORAL_TABLET | Freq: Two times a day (BID) | ORAL | Status: DC
  Administered 2013-04-17 – 2013-04-22 (×9): 8.6 mg via ORAL
  Filled 2013-04-17 (×14): qty 1

## 2013-04-17 MED ORDER — PHENOL 1.4 % MT LIQD: 1.0000 | OROMUCOSAL | Status: DC | PRN

## 2013-04-17 MED ORDER — VITAMIN D3 25 MCG (1000 UNIT) PO TABS
5000.0000 [IU] | ORAL_TABLET | Freq: Every day | ORAL | Status: DC
  Administered 2013-04-17 – 2013-04-22 (×6): 5000 [IU] via ORAL
  Filled 2013-04-17 (×6): qty 5

## 2013-04-17 MED ORDER — SODIUM CHLORIDE 0.9 % IV SOLN: 250.0000 mL | INTRAVENOUS | Status: DC

## 2013-04-17 MED ORDER — ALBUMIN HUMAN 5 % IV SOLN
INTRAVENOUS | Status: DC | PRN
  Administered 2013-04-17: 12:00:00 via INTRAVENOUS

## 2013-04-17 MED ORDER — ALUM & MAG HYDROXIDE-SIMETH 200-200-20 MG/5ML PO SUSP
30.0000 mL | Freq: Four times a day (QID) | ORAL | Status: DC | PRN
  Administered 2013-04-22: 30 mL via ORAL
  Filled 2013-04-17: qty 30

## 2013-04-17 MED ORDER — ONDANSETRON HCL 4 MG/2ML IJ SOLN
INTRAMUSCULAR | Status: AC
  Filled 2013-04-17: qty 2

## 2013-04-17 MED ORDER — VITAMIN D3 125 MCG (5000 UT) PO CAPS: 1.0000 | ORAL_CAPSULE | Freq: Every day | ORAL | Status: DC

## 2013-04-17 MED ORDER — IRBESARTAN 150 MG PO TABS
150.0000 mg | ORAL_TABLET | Freq: Every day | ORAL | Status: DC
Start: 2013-04-17 — End: 2013-04-22
  Administered 2013-04-17 – 2013-04-22 (×3): 150 mg via ORAL
  Filled 2013-04-17 (×6): qty 1

## 2013-04-17 MED ORDER — EPHEDRINE SULFATE 50 MG/ML IJ SOLN
INTRAMUSCULAR | Status: DC | PRN
  Administered 2013-04-17 (×3): 5 mg via INTRAVENOUS
  Administered 2013-04-17: 10 mg via INTRAVENOUS

## 2013-04-17 MED ORDER — PROGESTERONE MICRONIZED 100 MG PO CAPS
100.0000 mg | ORAL_CAPSULE | Freq: Every day | ORAL | Status: DC
  Administered 2013-04-17 – 2013-04-21 (×5): 100 mg via ORAL
  Filled 2013-04-17 (×6): qty 1

## 2013-04-17 MED ORDER — ESTRADIOL 0.05 MG/24HR TD PTWK
0.0500 mg | MEDICATED_PATCH | TRANSDERMAL | Status: DC
  Filled 2013-04-17: qty 1

## 2013-04-17 MED ORDER — CEFAZOLIN SODIUM-DEXTROSE 2-3 GM-% IV SOLR
INTRAVENOUS | Status: AC
  Filled 2013-04-17: qty 50

## 2013-04-17 MED ORDER — FAMOTIDINE 10 MG PO TABS
10.0000 mg | ORAL_TABLET | Freq: Two times a day (BID) | ORAL | Status: DC | PRN
  Administered 2013-04-18 – 2013-04-19 (×2): 10 mg via ORAL
  Filled 2013-04-17 (×2): qty 1

## 2013-04-17 MED ORDER — DIAZEPAM 5 MG PO TABS
5.0000 mg | ORAL_TABLET | Freq: Four times a day (QID) | ORAL | Status: DC | PRN
  Administered 2013-04-17 – 2013-04-22 (×14): 5 mg via ORAL
  Filled 2013-04-17 (×13): qty 1

## 2013-04-17 MED ORDER — VITAMIN C 500 MG PO TABS
1000.0000 mg | ORAL_TABLET | Freq: Every day | ORAL | Status: DC
  Administered 2013-04-18 – 2013-04-22 (×5): 1000 mg via ORAL
  Filled 2013-04-17 (×6): qty 2

## 2013-04-17 MED ORDER — ARTIFICIAL TEARS OP OINT
TOPICAL_OINTMENT | OPHTHALMIC | Status: DC | PRN
  Administered 2013-04-17: 1 via OPHTHALMIC

## 2013-04-17 MED ORDER — MENTHOL 3 MG MT LOZG
1.0000 | LOZENGE | OROMUCOSAL | Status: DC | PRN
  Filled 2013-04-17: qty 9

## 2013-04-17 MED ORDER — FENTANYL CITRATE 0.05 MG/ML IJ SOLN
INTRAMUSCULAR | Status: DC | PRN
  Administered 2013-04-17 (×4): 50 ug via INTRAVENOUS
  Administered 2013-04-17: 100 ug via INTRAVENOUS
  Administered 2013-04-17: 50 ug via INTRAVENOUS
  Administered 2013-04-17: 100 ug via INTRAVENOUS
  Administered 2013-04-17: 50 ug via INTRAVENOUS

## 2013-04-17 MED ORDER — ACETAMINOPHEN 500 MG PO TABS
500.0000 mg | ORAL_TABLET | Freq: Four times a day (QID) | ORAL | Status: DC | PRN
  Filled 2013-04-17: qty 1

## 2013-04-17 MED ORDER — DIAZEPAM 5 MG PO TABS
ORAL_TABLET | ORAL | Status: AC
  Filled 2013-04-17: qty 1

## 2013-04-17 MED ORDER — CEFAZOLIN SODIUM 1-5 GM-% IV SOLN
1.0000 g | Freq: Three times a day (TID) | INTRAVENOUS | Status: AC
  Administered 2013-04-17 – 2013-04-18 (×2): 1 g via INTRAVENOUS
  Filled 2013-04-17 (×3): qty 50

## 2013-04-17 MED ORDER — SODIUM CHLORIDE 0.9 % IJ SOLN
3.0000 mL | Freq: Two times a day (BID) | INTRAMUSCULAR | Status: DC
  Administered 2013-04-18 – 2013-04-22 (×7): 3 mL via INTRAVENOUS

## 2013-04-17 MED ORDER — OXYCODONE-ACETAMINOPHEN 5-325 MG PO TABS
1.0000 | ORAL_TABLET | ORAL | Status: DC | PRN
  Administered 2013-04-17 – 2013-04-22 (×25): 2 via ORAL
  Filled 2013-04-17 (×24): qty 2

## 2013-04-17 MED ORDER — CALCIUM CARBONATE ANTACID 750 MG PO CHEW: 2.0000 | CHEWABLE_TABLET | Freq: Every day | ORAL | Status: DC

## 2013-04-17 MED ORDER — PHENYLEPHRINE HCL 10 MG/ML IJ SOLN
INTRAMUSCULAR | Status: DC | PRN
  Administered 2013-04-17: 80 ug via INTRAVENOUS

## 2013-04-17 MED ORDER — HYDROCODONE-ACETAMINOPHEN 5-325 MG PO TABS: 1.0000 | ORAL_TABLET | ORAL | Status: DC | PRN

## 2013-04-17 MED ORDER — HYDROMORPHONE HCL PF 1 MG/ML IJ SOLN
INTRAMUSCULAR | Status: AC
  Administered 2013-04-17: 1 mg
  Filled 2013-04-17: qty 1

## 2013-04-17 MED ORDER — PANTOPRAZOLE SODIUM 40 MG PO TBEC: 40.0000 mg | DELAYED_RELEASE_TABLET | Freq: Every day | ORAL | Status: DC

## 2013-04-17 MED ORDER — BUPIVACAINE HCL (PF) 0.5 % IJ SOLN
INTRAMUSCULAR | Status: DC | PRN
  Administered 2013-04-17: 12 mL
  Administered 2013-04-17: 5 mL

## 2013-04-17 MED ORDER — VITAMIN E 180 MG (400 UNIT) PO CAPS
400.0000 [IU] | ORAL_CAPSULE | Freq: Every day | ORAL | Status: DC
  Filled 2013-04-17 (×6): qty 1

## 2013-04-17 MED ORDER — LACTATED RINGERS IV SOLN
INTRAVENOUS | Status: DC | PRN
  Administered 2013-04-17 (×3): via INTRAVENOUS

## 2013-04-17 MED ORDER — MECLIZINE HCL 25 MG PO TABS
25.0000 mg | ORAL_TABLET | Freq: Three times a day (TID) | ORAL | Status: DC | PRN
  Filled 2013-04-17: qty 1

## 2013-04-17 SURGICAL SUPPLY — 94 items
ADH SKN CLS APL DERMABOND .7 (GAUZE/BANDAGES/DRESSINGS) ×4
APL SKNCLS STERI-STRIP NONHPOA (GAUZE/BANDAGES/DRESSINGS) ×2
BAG DECANTER FOR FLEXI CONT (MISCELLANEOUS) ×4 IMPLANT
BENZOIN TINCTURE PRP APPL 2/3 (GAUZE/BANDAGES/DRESSINGS) ×3 IMPLANT
BLADE SURG 10 STRL SS (BLADE) ×1 IMPLANT
BLADE SURG ROTATE 9660 (MISCELLANEOUS) IMPLANT
BONE VOID FILLER STRIP 10CC (Bone Implant) ×2 IMPLANT
BUR MATCHSTICK NEURO 3.0 LAGG (BURR) ×1 IMPLANT
BUR ROUND FLUTED 5 RND (BURR) ×1 IMPLANT
CAGE 11MM (Cage) ×2 IMPLANT
CLOTH BEACON ORANGE TIMEOUT ST (SAFETY) ×6 IMPLANT
CONT SPEC 4OZ CLIKSEAL STRL BL (MISCELLANEOUS) ×5 IMPLANT
COROENT XL 10X22X45 (Orthopedic Implant) ×1 IMPLANT
COROENT XL-W 8X22X50 (Orthopedic Implant) ×1 IMPLANT
COVER BACK TABLE 24X17X13 BIG (DRAPES) IMPLANT
COVER TABLE BACK 60X90 (DRAPES) ×4 IMPLANT
DERMABOND ADVANCED (GAUZE/BANDAGES/DRESSINGS) ×2
DERMABOND ADVANCED .7 DNX12 (GAUZE/BANDAGES/DRESSINGS) ×4 IMPLANT
DRAPE C-ARM 42X72 X-RAY (DRAPES) ×6 IMPLANT
DRAPE C-ARMOR (DRAPES) ×6 IMPLANT
DRAPE LAPAROTOMY 100X72X124 (DRAPES) ×6 IMPLANT
DRAPE POUCH INSTRU U-SHP 10X18 (DRAPES) ×6 IMPLANT
DRAPE SURG 17X23 STRL (DRAPES) ×4 IMPLANT
DRESSING TELFA 8X3 (GAUZE/BANDAGES/DRESSINGS) ×3 IMPLANT
DRSG OPSITE POSTOP 3X4 (GAUZE/BANDAGES/DRESSINGS) ×1 IMPLANT
DRSG OPSITE POSTOP 4X10 (GAUZE/BANDAGES/DRESSINGS) ×1 IMPLANT
DURAPREP 26ML APPLICATOR (WOUND CARE) ×6 IMPLANT
ELECT REM PT RETURN 9FT ADLT (ELECTROSURGICAL) ×6
ELECTRODE REM PT RTRN 9FT ADLT (ELECTROSURGICAL) ×4 IMPLANT
GAUZE SPONGE 4X4 16PLY XRAY LF (GAUZE/BANDAGES/DRESSINGS) ×2 IMPLANT
GLOVE BIO SURGEON STRL SZ8 (GLOVE) ×9 IMPLANT
GLOVE BIOGEL PI IND STRL 7.5 (GLOVE) IMPLANT
GLOVE BIOGEL PI IND STRL 8 (GLOVE) ×4 IMPLANT
GLOVE BIOGEL PI IND STRL 8.5 (GLOVE) ×6 IMPLANT
GLOVE BIOGEL PI INDICATOR 7.5 (GLOVE) ×1
GLOVE BIOGEL PI INDICATOR 8 (GLOVE) ×2
GLOVE BIOGEL PI INDICATOR 8.5 (GLOVE) ×5
GLOVE ECLIPSE 8.0 STRL XLNG CF (GLOVE) ×6 IMPLANT
GLOVE EXAM NITRILE LRG STRL (GLOVE) IMPLANT
GLOVE EXAM NITRILE MD LF STRL (GLOVE) IMPLANT
GLOVE EXAM NITRILE XL STR (GLOVE) IMPLANT
GLOVE EXAM NITRILE XS STR PU (GLOVE) IMPLANT
GLOVE SURG SS PI 7.0 STRL IVOR (GLOVE) ×2 IMPLANT
GOWN BRE IMP SLV AUR LG STRL (GOWN DISPOSABLE) IMPLANT
GOWN BRE IMP SLV AUR XL STRL (GOWN DISPOSABLE) ×9 IMPLANT
GOWN STRL REIN 2XL LVL4 (GOWN DISPOSABLE) ×3 IMPLANT
INTERLOCK LORDOTIC 12X22X50 (Bone Implant) IMPLANT
KIT BASIN OR (CUSTOM PROCEDURE TRAY) ×5 IMPLANT
KIT DILATOR XLIF 5 (KITS) IMPLANT
KIT INFUSE LRG II (Orthopedic Implant) ×1 IMPLANT
KIT MAXCESS (KITS) ×1 IMPLANT
KIT NDL NVM5 EMG ELECT (KITS) IMPLANT
KIT NEEDLE NVM5 EMG ELECT (KITS) ×2 IMPLANT
KIT NEEDLE NVM5 EMG ELECTRODE (KITS) ×1
KIT POSITION SURG JACKSON T1 (MISCELLANEOUS) ×2 IMPLANT
KIT ROOM TURNOVER OR (KITS) ×5 IMPLANT
KIT XLIF (KITS) ×1
LORDOTIC 12X22X50 (Bone Implant) ×3 IMPLANT
MARKER SKIN DUAL TIP RULER LAB (MISCELLANEOUS) ×3 IMPLANT
MILL MEDIUM DISP (BLADE) ×1 IMPLANT
NDL HYPO 25X1 1.5 SAFETY (NEEDLE) ×4 IMPLANT
NEEDLE HYPO 25X1 1.5 SAFETY (NEEDLE) ×3 IMPLANT
NEEDLE TARGETING (NEEDLE) ×2 IMPLANT
NEEDLE TROCAR TARGETING (NEEDLE) ×3 IMPLANT
NS IRRIG 1000ML POUR BTL (IV SOLUTION) ×6 IMPLANT
PACK LAMINECTOMY NEURO (CUSTOM PROCEDURE TRAY) ×6 IMPLANT
PAD ARMBOARD 7.5X6 YLW CONV (MISCELLANEOUS) ×9 IMPLANT
PATTIES SURGICAL .5 X.5 (GAUZE/BANDAGES/DRESSINGS) IMPLANT
PATTIES SURGICAL .5 X1 (DISPOSABLE) IMPLANT
PATTIES SURGICAL 1X1 (DISPOSABLE) IMPLANT
ROD TI STRT ILLICO 5.5X130 (Rod) ×2 IMPLANT
SCREW CANN PA ILLICO 6.5X40 (Screw) ×2 IMPLANT
SCREW CANNULATED 40MM (Screw) ×4 IMPLANT
SCREW SACR CANN ILLICO 6.5X40 (Screw) ×2 IMPLANT
SCREW SET (Screw) ×8 IMPLANT
SLEEVE SURGEON STRL (DRAPES) ×1 IMPLANT
SPONGE GAUZE 4X4 12PLY (GAUZE/BANDAGES/DRESSINGS) ×3 IMPLANT
SPONGE LAP 4X18 X RAY DECT (DISPOSABLE) IMPLANT
SPONGE SURGIFOAM ABS GEL 100 (HEMOSTASIS) ×1 IMPLANT
STAPLER SKIN PROX WIDE 3.9 (STAPLE) ×3 IMPLANT
STRIP CLOSURE SKIN 1/2X4 (GAUZE/BANDAGES/DRESSINGS) ×3 IMPLANT
SUT VIC AB 1 CT1 18XBRD ANBCTR (SUTURE) ×6 IMPLANT
SUT VIC AB 1 CT1 8-18 (SUTURE) ×12
SUT VIC AB 2-0 CT1 18 (SUTURE) ×10 IMPLANT
SUT VIC AB 3-0 SH 8-18 (SUTURE) ×10 IMPLANT
SYR 20ML ECCENTRIC (SYRINGE) ×5 IMPLANT
SYR 3ML LL SCALE MARK (SYRINGE) ×3 IMPLANT
SYR INSULIN 1ML 31GX6 SAFETY (SYRINGE) IMPLANT
TAPE CLOTH 3X10 TAN LF (GAUZE/BANDAGES/DRESSINGS) ×9 IMPLANT
TIP TROCAR NITINOL ILLICO 18 (INSTRUMENTS) ×8 IMPLANT
TOWEL OR 17X24 6PK STRL BLUE (TOWEL DISPOSABLE) ×6 IMPLANT
TOWEL OR 17X26 10 PK STRL BLUE (TOWEL DISPOSABLE) ×6 IMPLANT
TRAY FOLEY CATH 14FRSI W/METER (CATHETERS) ×5 IMPLANT
WATER STERILE IRR 1000ML POUR (IV SOLUTION) ×5 IMPLANT

## 2013-04-17 NOTE — Op Note (Signed)
04/17/2013  2:16 PM  PATIENT:  Melissa Mcclure  57 y.o. female  PRE-OPERATIVE DIAGNOSIS:  Scoliosis, Spondylolisthesis, Lumbar spondylolysis, Lumbar radiculopathy L 23, L 34, L 45, L 5 S1  POST-OPERATIVE DIAGNOSIS:  Scoliosis, Spondylolisthesis, Lumbar spondylolysis, Lumbar radiculopathy L 23, L 34, L 45, L 5 S1  PROCEDURE:  Procedure(s) with comments: ANTERIOR LATERAL LUMBAR FUSION 3 LEVELS (Right) - Right Lumbar Two-Three Lumbar Three-Four Lumbar Four-Five  Anterolateral Fusion LUMBAR PERCUTANEOUS PEDICLE SCREW 3 LEVEL (N/A) - Decompression and Posterior Lumbar Interbody Fusion of Lumbar Five-Sacral One, Percutaneous Screws Lumbar Two through Sacral One L5 Gill with PLIF L 5 S 1 with posterolateral arthrodesis  SURGEON:  Surgeon(s) and Role:    * Maeola Harman, MD - Primary    * Barnett Abu, MD - Assisting  PHYSICIAN ASSISTANT:   ASSISTANTS: Poteat, RN   ANESTHESIA:   general  EBL:  Total I/O In: 2250 [I.V.:2000; IV Piggyback:250] Out: 615 [Urine:415; Blood:200]  BLOOD ADMINISTERED:none  DRAINS: none   LOCAL MEDICATIONS USED:  LIDOCAINE   SPECIMEN:  No Specimen  DISPOSITION OF SPECIMEN:  N/A  COUNTS:  YES  TOURNIQUET:  * No tourniquets in log *  DICTATION:DICTATION: Patient is a 57 year old with severe spondylosis stenosis and scoliosis of the lumbar spine. She has L 5 spondylolysis with L5 S1 spondylolisthesis. It was elected to take her to surgery for anterolateral decompression and posterior pedicle screw fixation with L5 Gill and PLIF.Marland Kitchen  Procedure: Patient was brought to the operating room and placed in a left lateral decubitus position on the operative table and using orthogonally projected C-arm fluoroscopy the patient was placed so that the L2-3 L3-4 and L4-5 levels were visualized in AP and lateral plane. The patient was then taped into position. The table was flexed so as to expose the L4-5 level as the patient has a high iliac crest. Skin was marked along with a  posterior finger dissection incision. His flank was then prepped and draped in usual sterile fashion and incisions were made sequentially at L4-5 L3-4 and L2-3 levels. Posterior finger dissection was made to enter the retroperitoneal space and then subsequently the probe was inserted into the psoas muscle from the right side initially at the L4-5 level. After mapping the neural elements were able to dock the probe per the midpoint of this vertebral level and without indications electrically of too close proximity to the neural tissues. Subsequently the self-retaining tractor was.after sequential dilators were utilized the shim was employed and the interspace was cleared of psoas muscle and then incised. A thorough discectomy was performed. Angled instruments were used to clear the interspace of disc material. After thorough discectomy was performed and this was performed using AP and lateral fluoroscopy a 12 lordotic by 50 x 22 mm implant was packed with BMP and NexOss with autologous blood. This was tamped into position using the slides and its position was confirmed on AP and lateral fluoroscopy. Subsequently exposure was performed at the L3-4 level and similar dissection was performed with locking of the self-retaining retractor. At this level were able to place an 8 standard by 22 x 50 mm implant packed in a similar fashion. At the L2-3 level were able to place a 10 mm standard by 45 x 22 mm implant packed in a similar fashion. Hemostasis  was assured the wounds were irrigated interrupted Vicryl sutures.  Patient was then turned into a prone position on the operating table on chest rolls and using AP and lateral fluoroscopy throughout  this portion of the procedure, pedicle screws were placed using alphatech cannulated percutaneous screws. Initially, however, a midline incision was made exposing the L 5 and S 1 level.  Subperiosteal dissection was performed.  Intraoperative X ray was used to confirm level.  An  L 5 Gill procedure was performed, followed by thorough discectomy, initially on the left and subsequently on the right with sequential shavers and thorough disc preparation.  After trial sizing, 11 mm PLIF cages were placed along with remaining BMP and 6 cc moresllized local autograft.  Posterolateral region was decorticated and packed with remaining autograft and NexOss. Subsequently pedicle screws were placed: 2 screws were placed at L2 and (5.5 x 40 mm) and 1 at L3 and one at L4 of a similar size and 2 at L5 (6.5 x 40 mm) and similarly sized screws at the S1 level.  130 mm rods were then affixed to the screw heads do a separate stab incision and locked down on the screws. All connections were then torqued and the Towers were disassembled. The wounds were irrigated and then closed with 1, 2-0 and 3-0 Vicryl stitches. Sterile occlusive dressing was placed with Dermabond and the midline incision was dressed with steri-strips.  The patient was then extubated in the operating room and taken to recovery in stable and satisfactory condition having tolerated her operation well. Counts were correct at the end of the case.   PLAN OF CARE: Admit to inpatient   PATIENT DISPOSITION:  PACU - hemodynamically stable.   Delay start of Pharmacological VTE agent (>24hrs) due to surgical blood loss or risk of bleeding: yes

## 2013-04-17 NOTE — Progress Notes (Signed)
Awake, alert, conversant.  Full strength Bilateral lower extremities.  Sore in back.  Doing well.

## 2013-04-17 NOTE — Progress Notes (Signed)
Re[port received from [facility] for patient care

## 2013-04-17 NOTE — Progress Notes (Signed)
Pt admitted to 4N10 from pacu.  Describes pain as "pressure"  Medicated in pacu.  Dsg stain marked on back.  Dermabond to abdomen is intact.  Bed alarm on.  Will continue to monitor.

## 2013-04-17 NOTE — Anesthesia Preprocedure Evaluation (Signed)
Anesthesia Evaluation  Patient identified by MRN, date of birth, ID band Patient awake    Reviewed: Allergy & Precautions, H&P , NPO status , Patient's Chart, lab work & pertinent test results  Airway Mallampati: II TM Distance: >3 FB     Dental  (+) Teeth Intact and Dental Advisory Given   Pulmonary  breath sounds clear to auscultation        Cardiovascular Rhythm:Regular Rate:Normal     Neuro/Psych    GI/Hepatic   Endo/Other    Renal/GU      Musculoskeletal   Abdominal   Peds  Hematology   Anesthesia Other Findings   Reproductive/Obstetrics                           Anesthesia Physical Anesthesia Plan  ASA: II  Anesthesia Plan: General   Post-op Pain Management:    Induction: Intravenous  Airway Management Planned: Oral ETT  Additional Equipment:   Intra-op Plan:   Post-operative Plan: Extubation in OR  Informed Consent: I have reviewed the patients History and Physical, chart, labs and discussed the procedure including the risks, benefits and alternatives for the proposed anesthesia with the patient or authorized representative who has indicated his/her understanding and acceptance.   Dental advisory given  Plan Discussed with: CRNA and Anesthesiologist  Anesthesia Plan Comments: (Htn Post-op N/V Lumbar Spondylosis  Plan GA  Kipp Brood, MD)        Anesthesia Quick Evaluation

## 2013-04-17 NOTE — Anesthesia Procedure Notes (Signed)
Procedure Name: Intubation Date/Time: 04/17/2013 7:38 AM Performed by: Gayla Medicus Pre-anesthesia Checklist: Patient identified, Timeout performed, Emergency Drugs available, Suction available and Patient being monitored Patient Re-evaluated:Patient Re-evaluated prior to inductionOxygen Delivery Method: Circle system utilized Preoxygenation: Pre-oxygenation with 100% oxygen Intubation Type: IV induction Ventilation: Mask ventilation without difficulty Laryngoscope Size: Mac and 3 Grade View: Grade I Tube type: Oral Tube size: 7.5 mm Number of attempts: 1 Airway Equipment and Method: Stylet Placement Confirmation: ETT inserted through vocal cords under direct vision,  positive ETCO2 and breath sounds checked- equal and bilateral Secured at: 21 cm Tube secured with: Tape Dental Injury: Teeth and Oropharynx as per pre-operative assessment

## 2013-04-17 NOTE — Progress Notes (Signed)
This note also relates to the following rows which could not be included: SpO2 - Cannot attach notes to unvalidated device data End Tidal CO2 (EtCO2) - Cannot attach notes to unvalidated device data   DR. Joslin at beside spoke with patient

## 2013-04-17 NOTE — Transfer of Care (Signed)
Immediate Anesthesia Transfer of Care Note  Patient: NARISSA BEAUFORT  Procedure(s) Performed: Procedure(s) with comments: ANTERIOR LATERAL LUMBAR FUSION 3 LEVELS (Right) - Right Lumbar Two-Three Lumbar Three-Four Lumbar Four-Five  Anterolateral Fusion LUMBAR PERCUTANEOUS PEDICLE SCREW 3 LEVEL (N/A) - Decompression and Posterior Lumbar Interbody Fusion of Lumbar Five-Sacral One, Percutaneous Screws Lumbar Two through Sacral One  Patient Location: PACU  Anesthesia Type:General  Level of Consciousness: awake, alert  and oriented  Airway & Oxygen Therapy: Patient Spontanous Breathing and Patient connected to nasal cannula oxygen  Post-op Assessment: Report given to PACU RN, Post -op Vital signs reviewed and stable and Patient moving all extremities X 4  Post vital signs: Reviewed and stable  Complications: No apparent anesthesia complications

## 2013-04-17 NOTE — Anesthesia Postprocedure Evaluation (Signed)
  Anesthesia Post-op Note  Patient: Melissa Mcclure  Procedure(s) Performed: Procedure(s) with comments: ANTERIOR LATERAL LUMBAR FUSION 3 LEVELS (Right) - Right Lumbar Two-Three Lumbar Three-Four Lumbar Four-Five  Anterolateral Fusion LUMBAR PERCUTANEOUS PEDICLE SCREW 3 LEVEL (N/A) - Decompression and Posterior Lumbar Interbody Fusion of Lumbar Five-Sacral One, Percutaneous Screws Lumbar Two through Sacral One  Patient Location: PACU  Anesthesia Type:General  Level of Consciousness: awake, alert  and oriented  Airway and Oxygen Therapy: Patient Spontanous Breathing and Patient connected to nasal cannula oxygen  Post-op Pain: mild  Post-op Assessment: Post-op Vital signs reviewed, Patient's Cardiovascular Status Stable, Respiratory Function Stable, Patent Airway and Pain level controlled  Post-op Vital Signs: stable  Complications: No apparent anesthesia complications 

## 2013-04-17 NOTE — Interval H&P Note (Signed)
History and Physical Interval Note:  04/17/2013 7:04 AM  Melissa Mcclure  has presented today for surgery, with the diagnosis of Scoliosis, Spondylolisthesis, Lumbar spondylosis, Lumbar radiculopathy  The various methods of treatment have been discussed with the patient and family. After consideration of risks, benefits and other options for treatment, the patient has consented to  Procedure(s) with comments: ANTERIOR LATERAL LUMBAR FUSION 3 LEVELS (Right) - Right L2-3 L3-4 L4-5 Anterolateral decompression/fusion/percutaneous pedicle screws with L5-S1 Decompression/fusion LUMBAR PERCUTANEOUS PEDICLE SCREW 3 LEVEL (N/A) as a surgical intervention .  The patient's history has been reviewed, patient examined, no change in status, stable for surgery.  I have reviewed the patient's chart and labs.  Questions were answered to the patient's satisfaction.     Creighton Longley D

## 2013-04-17 NOTE — Brief Op Note (Signed)
04/17/2013  2:16 PM  PATIENT:  Melissa Mcclure  57 y.o. female  PRE-OPERATIVE DIAGNOSIS:  Scoliosis, Spondylolisthesis, Lumbar spondylolysis, Lumbar radiculopathy L 23, L 34, L 45, L 5 S1  POST-OPERATIVE DIAGNOSIS:  Scoliosis, Spondylolisthesis, Lumbar spondylolysis, Lumbar radiculopathy L 23, L 34, L 45, L 5 S1  PROCEDURE:  Procedure(s) with comments: ANTERIOR LATERAL LUMBAR FUSION 3 LEVELS (Right) - Right Lumbar Two-Three Lumbar Three-Four Lumbar Four-Five  Anterolateral Fusion LUMBAR PERCUTANEOUS PEDICLE SCREW 3 LEVEL (N/A) - Decompression and Posterior Lumbar Interbody Fusion of Lumbar Five-Sacral One, Percutaneous Screws Lumbar Two through Sacral One L5 Gill with PLIF L 5 S 1 with posterolateral arthrodesis  SURGEON:  Surgeon(s) and Role:    * Tell Rozelle, MD - Primary    * Henry Elsner, MD - Assisting  PHYSICIAN ASSISTANT:   ASSISTANTS: Poteat, RN   ANESTHESIA:   general  EBL:  Total I/O In: 2250 [I.V.:2000; IV Piggyback:250] Out: 615 [Urine:415; Blood:200]  BLOOD ADMINISTERED:none  DRAINS: none   LOCAL MEDICATIONS USED:  LIDOCAINE   SPECIMEN:  No Specimen  DISPOSITION OF SPECIMEN:  N/A  COUNTS:  YES  TOURNIQUET:  * No tourniquets in log *  DICTATION:DICTATION: Patient is a 57-year-old with severe spondylosis stenosis and scoliosis of the lumbar spine. She has L 5 spondylolysis with L5 S1 spondylolisthesis. It was elected to take her to surgery for anterolateral decompression and posterior pedicle screw fixation with L5 Gill and PLIF..  Procedure: Patient was brought to the operating room and placed in a left lateral decubitus position on the operative table and using orthogonally projected C-arm fluoroscopy the patient was placed so that the L2-3 L3-4 and L4-5 levels were visualized in AP and lateral plane. The patient was then taped into position. The table was flexed so as to expose the L4-5 level as the patient has a high iliac crest. Skin was marked along with a  posterior finger dissection incision. His flank was then prepped and draped in usual sterile fashion and incisions were made sequentially at L4-5 L3-4 and L2-3 levels. Posterior finger dissection was made to enter the retroperitoneal space and then subsequently the probe was inserted into the psoas muscle from the right side initially at the L4-5 level. After mapping the neural elements were able to dock the probe per the midpoint of this vertebral level and without indications electrically of too close proximity to the neural tissues. Subsequently the self-retaining tractor was.after sequential dilators were utilized the shim was employed and the interspace was cleared of psoas muscle and then incised. A thorough discectomy was performed. Angled instruments were used to clear the interspace of disc material. After thorough discectomy was performed and this was performed using AP and lateral fluoroscopy a 12 lordotic by 50 x 22 mm implant was packed with BMP and NexOss with autologous blood. This was tamped into position using the slides and its position was confirmed on AP and lateral fluoroscopy. Subsequently exposure was performed at the L3-4 level and similar dissection was performed with locking of the self-retaining retractor. At this level were able to place an 8 standard by 22 x 50 mm implant packed in a similar fashion. At the L2-3 level were able to place a 10 mm standard by 45 x 22 mm implant packed in a similar fashion. Hemostasis  was assured the wounds were irrigated interrupted Vicryl sutures.  Patient was then turned into a prone position on the operating table on chest rolls and using AP and lateral fluoroscopy throughout   this portion of the procedure, pedicle screws were placed using alphatech cannulated percutaneous screws. Initially, however, a midline incision was made exposing the L 5 and S 1 level.  Subperiosteal dissection was performed.  Intraoperative X ray was used to confirm level.  An  L 5 Gill procedure was performed, followed by thorough discectomy, initially on the left and subsequently on the right with sequential shavers and thorough disc preparation.  After trial sizing, 11 mm PLIF cages were placed along with remaining BMP and 6 cc moresllized local autograft.  Posterolateral region was decorticated and packed with remaining autograft and NexOss. Subsequently pedicle screws were placed: 2 screws were placed at L2 and (5.5 x 40 mm) and 1 at L3 and one at L4 of a similar size and 2 at L5 (6.5 x 40 mm) and similarly sized screws at the S1 level.  130 mm rods were then affixed to the screw heads do a separate stab incision and locked down on the screws. All connections were then torqued and the Towers were disassembled. The wounds were irrigated and then closed with 1, 2-0 and 3-0 Vicryl stitches. Sterile occlusive dressing was placed with Dermabond and the midline incision was dressed with steri-strips.  The patient was then extubated in the operating room and taken to recovery in stable and satisfactory condition having tolerated her operation well. Counts were correct at the end of the case.   PLAN OF CARE: Admit to inpatient   PATIENT DISPOSITION:  PACU - hemodynamically stable.   Delay start of Pharmacological VTE agent (>24hrs) due to surgical blood loss or risk of bleeding: yes  

## 2013-04-17 NOTE — Preoperative (Signed)
Beta Blockers   Reason not to administer Beta Blockers:Not Applicable 

## 2013-04-17 NOTE — OR Nursing (Signed)
Neuro monitoring provided by [employer]. Needles placed by nursing staff, sites assessed pre and post insertion. Slight bleeding from the needle sites occurred after they were taken out but stopped with some pressure.

## 2013-04-17 NOTE — Anesthesia Postprocedure Evaluation (Signed)
  Anesthesia Post-op Note  Patient: Melissa Mcclure  Procedure(s) Performed: Procedure(s) with comments: ANTERIOR LATERAL LUMBAR FUSION 3 LEVELS (Right) - Right Lumbar Two-Three Lumbar Three-Four Lumbar Four-Five  Anterolateral Fusion LUMBAR PERCUTANEOUS PEDICLE SCREW 3 LEVEL (N/A) - Decompression and Posterior Lumbar Interbody Fusion of Lumbar Five-Sacral One, Percutaneous Screws Lumbar Two through Sacral One  Patient Location: PACU  Anesthesia Type:General  Level of Consciousness: awake, alert  and oriented  Airway and Oxygen Therapy: Patient Spontanous Breathing and Patient connected to nasal cannula oxygen  Post-op Pain: mild  Post-op Assessment: Post-op Vital signs reviewed, Patient's Cardiovascular Status Stable, Respiratory Function Stable, Patent Airway and Pain level controlled  Post-op Vital Signs: stable  Complications: No apparent anesthesia complications

## 2013-04-18 NOTE — Evaluation (Signed)
Physical Therapy Evaluation Patient Details Name: KENNIA VANVORST MRN: [id number] DOB: 08-Oct-1955 Today's Date: 04/18/2013 Time: 4098-1191 PT Time Calculation (min): 33 min  PT Assessment / Plan / Recommendation History of Present Illness  pt adm for anteriolateral L2-5 lumbar fusion with PLIF L5-S1  Clinical Impression  Patient is s/p anterolateral and posterior L2-S1back surgery resulting in the deficits listed below (see PT Problem List). Pt limited today by lightheadedness and nausea (she was premedicated with pain meds, valium and zofran).  Patient will benefit from skilled PT to increase their independence and safety with mobility (while adhering to their precautions) to allow discharge home with family assist.      PT Assessment  Patient needs continued PT services    Follow Up Recommendations  No PT follow up;Home health PT;Supervision for mobility/OOB    Does the patient have the potential to tolerate intense rehabilitation      Barriers to Discharge        Equipment Recommendations  None recommended by PT    Recommendations for Other Services OT consult   Frequency Min 5X/week    Precautions / Restrictions Precautions Precautions: Back Precaution Booklet Issued: Yes (comment) Required Braces or Orthoses: Spinal Brace Spinal Brace: Applied in sitting position   Pertinent Vitals/Pain Back 10/10 after pre-medication (facial expression/demeanor/ did not correlate with her reported #); patient repositioned for comfort      Mobility  Bed Mobility Bed Mobility: Rolling Left;Left Sidelying to Sit;Sitting - Scoot to [place] of Bed Rolling Left: 4: Min assist;With rail Left Sidelying to Sit: 3: Mod assist;With rails;HOB flat Sitting - Scoot to Edge of Bed: 4: Min guard Details for Bed Mobility Assistance: vc for technique to maintian back precautions; assist due to pain and lightheadedness Transfers Transfers: Sit to Stand;Stand to Sit Sit to Stand: 4: Min assist;With  upper extremity assist Stand to Sit: 4: Min assist;With upper extremity assist Details for Transfer Assistance: vc for safe use of RW Ambulation/Gait Ambulation/Gait Assistance: 4: Min assist Ambulation Distance (Feet): 4 Feet Assistive device: Rolling walker Ambulation/Gait Assistance Details: vc for safe use of RW; encouragement to continue however pt felt too lightheaded Gait Pattern: Step-through pattern;Decreased stride length    Exercises     PT Diagnosis: Difficulty walking;Acute pain  PT Problem List: Decreased activity tolerance;Decreased mobility;Decreased knowledge of use of DME;Decreased knowledge of precautions;Pain PT Treatment Interventions: DME instruction;Gait training;Stair training;Functional mobility training;Therapeutic activities;Patient/family education     PT Goals(Current goals can be found in the care plan section) Acute Rehab PT Goals Patient Stated Goal: go home without needing home therapy PT Goal Formulation: With patient Time For Goal Achievement: 04/21/13 Potential to Achieve Goals: Good  Visit Information  Last PT Received On: 04/18/13 Assistance Needed: +1 PT/OT Co-Evaluation/Treatment: Yes History of Present Illness: pt adm for anteriolateral L2-5 lumbar fusion with PLIF L5-S1       Prior Functioning  Home Living Family/patient expects to be discharged to:: Private residence Living Arrangements: Spouse/significant other Available Help at Discharge: Family;Available 24 hours/day Type of Home: House Home Access: Stairs to enter [employer] of Steps: 3 Entrance Stairs-Rails: None Home Layout: One level Home Equipment: Environmental consultant - 2 wheels Prior Function Level of Independence: Independent Communication Communication: No difficulties Dominant Hand: Right    Cognition  Cognition Arousal/Alertness: Awake/alert Behavior During Therapy: WFL for tasks assessed/performed Overall Cognitive Status: Within Functional Limits for tasks  assessed    Extremity/Trunk Assessment Upper Extremity Assessment Upper Extremity Assessment: Overall WFL for tasks assessed Lower Extremity Assessment  Lower Extremity Assessment: Overall WFL for tasks assessed (denies numbness)   Balance    End of Session PT - End of Session Equipment Utilized During Treatment: Gait belt;Back brace Activity Tolerance: Treatment limited secondary to medical complications (Comment) Patient left: in chair;with call bell/phone within reach;with family/visitor present Nurse Communication: Mobility status;Other (comment) (lightheaded with activity; likely meds; BP still low)  GP     Cloys Vera,[place] 04/18/2013, 1:12 PM Pager [phone]

## 2013-04-18 NOTE — Plan of Care (Signed)
Problem: Consults Goal: Diagnosis - Spinal Surgery Thoraco/Lumbar Spine Fusion     

## 2013-04-18 NOTE — Progress Notes (Signed)
Patient ID: Melissa Mcclure, female   DOB: 1955/10/10, 57 y.o.   MRN: [id number] BP 84/36  Pulse 77  Temp(Src) 97.8 F (36.6 C) (Oral)  Resp 17  Ht 5\' 3"  (1.6 m)  Wt 62.687 kg (138 lb 3.2 oz)  BMI 24.49 kg/m2  SpO2 100% Alert and oriented x 4 Moving all extremities well Wounds are clean, dry, and without signs of infection.  Continue mobilization.

## 2013-04-18 NOTE — Evaluation (Signed)
Occupational Therapy Evaluation Patient Details Name: Melissa Mcclure MRN: [id number] DOB: 25-Nov-1955 Today's Date: 04/18/2013 Time: 1308-6578 OT Time Calculation (min): 34 min  OT Assessment / Plan / Recommendation History of present illness pt adm for anteriolateral L2-5 lumbar fusion with PLIF L5-S1   Clinical Impression   Pt admitted with above.  Pt will continue to benefit from acute OT services to address below problem list in prep for safe return home with family.   OT Assessment  Patient needs continued OT Services    Follow Up Recommendations  Supervision/Assistance - 24 hour;No OT follow up    Barriers to Discharge      Equipment Recommendations  3 in 1 bedside comode    Recommendations for Other Services    Frequency  Min 2X/week    Precautions / Restrictions Precautions Precautions: Back Precaution Booklet Issued: Yes (comment) Required Braces or Orthoses: Spinal Brace Spinal Brace: Applied in sitting position   Pertinent Vitals/Pain See vitals    ADL  Eating/Feeding: Performed;Independent Where Assessed - Eating/Feeding: Chair Grooming: Performed;Wash/dry face;Set up Where Assessed - Grooming: Supported sitting Upper Body Bathing: Simulated;Set up Where Assessed - Upper Body Bathing: Unsupported sitting Lower Body Bathing: Simulated;Minimal assistance Where Assessed - Lower Body Bathing: Supported sit to stand Upper Body Dressing: Performed;Minimal assistance Where Assessed - Upper Body Dressing: Unsupported sitting Lower Body Dressing: Performed;Moderate assistance Where Assessed - Lower Body Dressing: Supported sit to stand Toilet Transfer: Mining engineer Method: Sit to Barista:  (bed to chair) Equipment Used: Gait belt;Back brace;Rolling walker Transfers/Ambulation Related to ADLs: Min assist with RW. Limited by pt reporting dizziness/lightheadeness. ADL Comments: Incr time due to pain and  nausea/dizziness    OT Diagnosis: Generalized weakness;Acute pain  OT Problem List: Decreased strength;Decreased activity tolerance;Impaired balance (sitting and/or standing);Decreased safety awareness;Decreased knowledge of use of DME or AE;Decreased knowledge of precautions;Pain OT Treatment Interventions: Self-care/ADL training;DME and/or AE instruction;Therapeutic activities;Patient/family education;Balance training   OT Goals(Current goals can be found in the care plan section) Acute Rehab OT Goals Patient Stated Goal: to return home OT Goal Formulation: With patient Time For Goal Achievement: 04/25/13 Potential to Achieve Goals: Good  Visit Information  Last OT Received On: 04/18/13 Assistance Needed: +1 PT/OT Co-Evaluation/Treatment: Yes History of Present Illness: pt adm for anteriolateral L2-5 lumbar fusion with PLIF L5-S1       Prior Functioning     Home Living Family/patient expects to be discharged to:: Private residence Living Arrangements: Spouse/significant other Available Help at Discharge: Family;Available 24 hours/day Type of Home: House Home Access: Stairs to enter [employer] of Steps: 3 Entrance Stairs-Rails: None Home Layout: One level Home Equipment: Environmental consultant - 2 wheels Prior Function Level of Independence: Independent Communication Communication: No difficulties Dominant Hand: Right         Vision/Perception     Cognition  Cognition Arousal/Alertness: Awake/alert Behavior During Therapy: WFL for tasks assessed/performed Overall Cognitive Status: Within Functional Limits for tasks assessed    Extremity/Trunk Assessment Upper Extremity Assessment Upper Extremity Assessment: Overall WFL for tasks assessed Lower Extremity Assessment Lower Extremity Assessment: Overall WFL for tasks assessed (denies numbness)     Mobility Bed Mobility Bed Mobility: Rolling Left;Left Sidelying to Sit;Sitting - Scoot to [place] of Bed Rolling Left:  4: Min assist;With rail Left Sidelying to Sit: 3: Mod assist;With rails;HOB flat Sitting - Scoot to Edge of Bed: 4: Min guard Details for Bed Mobility Assistance: vc for technique to maintian back precautions; assist due to pain and  lightheadedness Transfers Transfers: Sit to Stand;Stand to Sit Sit to Stand: 4: Min assist;With upper extremity assist Stand to Sit: 4: Min assist;With upper extremity assist Details for Transfer Assistance: vc for safe use of RW     Exercise     Balance     End of Session OT - End of Session Equipment Utilized During Treatment: Gait belt;Rolling walker Activity Tolerance: Patient limited by fatigue;Patient limited by pain (nausea/dizziness) Patient left: in chair;with call bell/phone within reach;with family/visitor present Nurse Communication: Mobility status  GO   04/18/2013 Cipriano Mile OTR/L Pager [phone] Office [phone]   Cipriano Mile 04/18/2013, 1:11 PM

## 2013-04-18 NOTE — Progress Notes (Signed)
Utilization Review Completed.Melissa Mcclure T7/26/2014  

## 2013-04-19 LAB — CBC
MCH: 32 pg (ref 26.0–34.0)
MCHC: 35 g/dL (ref 30.0–36.0)
RDW: 12.5 % (ref 11.5–15.5)

## 2013-04-19 MED ORDER — SODIUM CHLORIDE 0.9 % IV SOLN: 250.0000 mL | INTRAVENOUS | Status: DC

## 2013-04-19 MED ORDER — SODIUM CHLORIDE 0.9 % IV SOLN
INTRAVENOUS | Status: DC
  Administered 2013-04-20: 04:00:00 via INTRAVENOUS

## 2013-04-19 MED ORDER — PANTOPRAZOLE SODIUM 40 MG PO TBEC
40.0000 mg | DELAYED_RELEASE_TABLET | Freq: Every day | ORAL | Status: DC
  Administered 2013-04-20 – 2013-04-22 (×3): 40 mg via ORAL
  Filled 2013-04-19 (×2): qty 1

## 2013-04-19 MED ORDER — ESTRADIOL 0.0375 MG/24HR TD PTWK
0.0375 mg | MEDICATED_PATCH | TRANSDERMAL | Status: DC
  Administered 2013-04-19: 0.0375 mg via TRANSDERMAL
  Filled 2013-04-19: qty 1

## 2013-04-19 NOTE — Evaluation (Signed)
Occupational Therapy Evaluation Patient Details Name: Melissa Mcclure MRN: [id number] DOB: April 25, 1956 Today's Date: 04/19/2013 Time: 4098-1191 OT Time Calculation (min): 25 min  OT Assessment / Plan / Recommendation History of present illness pt adm for anteriolateral L2-5 lumbar fusion with PLIF L5-S1      OT Assessment    Pt is slowly making progress.  Continues to demonstrate poor activity tolerance during functional mobility with c/o feeling weak and nauseous when moving. Encouraged pt to increase mobility with RN staff (walking to bathroom rather than using 3n1 next to bed).  Will continue to follow acutely.   Follow Up Recommendations  Supervision/Assistance - 24 hour;No OT follow up    Barriers to Discharge      Equipment Recommendations  3 in 1 bedside comode    Recommendations for Other Services    Frequency  Min 2X/week    Precautions / Restrictions Precautions Precautions: Back Precaution Booklet Issued: Yes (comment) Precaution Comments: pt able to state 2/3 and recalled 3rd with cues Required Braces or Orthoses: Spinal Brace Spinal Brace: Applied in sitting position   Pertinent Vitals/Pain See vitals    ADL  Eating/Feeding: Performed;Independent Where Assessed - Eating/Feeding: Chair Upper Body Dressing: Performed;Minimal assistance Where Assessed - Upper Body Dressing: Unsupported sitting Lower Body Dressing: Performed;Supervision/safety Where Assessed - Lower Body Dressing: Unsupported sitting Toilet Transfer: Simulated;Minimal assistance Toilet Transfer Method:  (ambulating) Toilet Transfer Equipment:  (bed to chair) Equipment Used: Back brace;Gait belt;Rolling walker Transfers/Ambulation Related to ADLs: Min guard with RW for short distance around bed to chair.  Pt limited by "weakness" and states she startes feeling dizzy when walking. ADL Comments: Min assist to don back brace while sitting EOB. Pt able to cross ankles over knees to don socks but with some  incr time due to sore/stiff.  Instructed pt to use this technique for LB bathing/dressing. Also recommended pt use 3n1 as a shower chair at home as well as over toilet.    OT Diagnosis:    OT Problem List:   OT Treatment Interventions:     OT Goals(Current goals can be found in the care plan section) Acute Rehab OT Goals Patient Stated Goal: to return home OT Goal Formulation: With patient Time For Goal Achievement: 04/25/13 Potential to Achieve Goals: Good ADL Goals Pt Will Perform Grooming: with modified independence;standing Pt Will Perform Lower Body Bathing: with supervision;with adaptive equipment;sit to/from stand Pt Will Perform Lower Body Dressing: with min assist;with adaptive equipment;sit to/from stand Pt Will Transfer to Toilet: with supervision;ambulating;regular height toilet;bedside commode Pt Will Perform Toileting - Clothing Manipulation and hygiene: with supervision;sit to/from stand Pt Will Perform Tub/Shower Transfer: Shower transfer;with supervision;3 in 1;rolling walker;ambulating  Visit Information  Last OT Received On: 04/19/13 Assistance Needed: +1 History of Present Illness: pt adm for anteriolateral L2-5 lumbar fusion with PLIF L5-S1       Prior Functioning               Vision/Perception     Cognition  Cognition Arousal/Alertness: Awake/alert Behavior During Therapy: WFL for tasks assessed/performed Overall Cognitive Status: Within Functional Limits for tasks assessed    Extremity/Trunk Assessment       Mobility Bed Mobility Bed Mobility: Rolling Right;Right Sidelying to Sit;Sitting - Scoot to Edge of Bed Rolling Right: 5: Supervision;With rail Rolling Left: 5: Supervision Right Sidelying to Sit: 5: Supervision;HOB elevated;With rails Sitting - Scoot to Edge of Bed: 5: Supervision  Details for Bed Mobility Assistance: VCs for technique Transfers Transfers: Sit to Stand;Stand  to Sit Sit to Stand: 4: Min assist;From bed;With upper  extremity assist Stand to Sit: 5: Supervision;To chair/3-in-1;With upper extremity assist;With armrests Details for Transfer Assistance: Assist for power up from bed. VCs for safe hand placement.     Exercise     Balance     End of Session OT - End of Session Equipment Utilized During Treatment: Gait belt;Rolling walker Activity Tolerance: Patient limited by fatigue Patient left: in chair;with call bell/phone within reach;with family/visitor present Nurse Communication: Mobility status  GO 04/19/2013 Cipriano Mile OTR/L Pager [phone] Office [phone]      Cipriano Mile 04/19/2013, 11:31 AM

## 2013-04-19 NOTE — Progress Notes (Signed)
Physical Therapy Treatment Patient Details Name: Melissa Mcclure MRN: [id number] DOB: 04/14/1956 Today's Date: 04/19/2013 Time: 1000-1025 PT Time Calculation (min): 25 min  PT Assessment / Plan / Recommendation  History of Present Illness pt adm for anteriolateral L2-5 lumbar fusion with PLIF L5-S1   Clinical Impression Pt continues to be limited by "weakness" and nausea. She was encouraged to eat more and to get up again to chair with nursing later today. Informed goal is to do stairs tomorrow.   PT Comments     Follow Up Recommendations  No PT follow up;Home health PT;Supervision for mobility/OOB;Other (comment) (pt hopeful she will not need HHPT)     Does the patient have the potential to tolerate intense rehabilitation     Barriers to Discharge        Equipment Recommendations  None recommended by PT    Recommendations for Other Services    Frequency Min 5X/week   Progress towards PT Goals Progress towards PT goals: Progressing toward goals  Plan Current plan remains appropriate    Precautions / Restrictions Precautions Precautions: Back Precaution Booklet Issued: Yes (comment) Precaution Comments: pt able to state 2/3 and recalled 3rd with cues Required Braces or Orthoses: Spinal Brace Spinal Brace: Applied in sitting position   Pertinent Vitals/Pain 5/10 Rt side/low back; patient repositioned for comfort     Mobility  Bed Mobility Bed Mobility: Sit to Sidelying Left;Rolling Right;Rolling Left Rolling Right: 5: Supervision Rolling Left: 5: Supervision Sit to Sidelying Left: 4: Min assist;With rail Details for Bed Mobility Assistance: vc for technique to maintian back precautions; assist due to feeling weak Transfers Transfers: Sit to Stand;Stand to Sit Sit to Stand: 4: Min assist;With upper extremity assist Stand to Sit: 4: Min assist;With upper extremity assist Details for Transfer Assistance: x 2; vc for safe use of RW/hand placement; pt tends to want to pull on  RW and it tips Ambulation/Gait Ambulation/Gait Assistance: 4: Min assist Ambulation Distance (Feet): 8 Feet (x2) Assistive device: Rolling walker Ambulation/Gait Assistance Details: vc for safe use of RW; vc for alignment with toilet Gait Pattern: Step-through pattern;Decreased stride length Gait velocity: very decr Stairs: No (discussed goal to try stairs tomorrow)    Exercises     PT Diagnosis:    PT Problem List:   PT Treatment Interventions:     PT Goals (current goals can now be found in the care plan section) Acute Rehab PT Goals Patient Stated Goal: go home without needing home therapy PT Goal Formulation: With patient Time For Goal Achievement: 04/21/13 Potential to Achieve Goals: Good  Visit Information  Last PT Received On: 04/19/13 Assistance Needed: +1 PT/OT Co-Evaluation/Treatment: Yes History of Present Illness: pt adm for anteriolateral L2-5 lumbar fusion with PLIF L5-S1    Subjective Data  Subjective: reports nausea and "feeling weak" are most limiting--not the pain Patient Stated Goal: go home without needing home therapy   Cognition  Cognition Arousal/Alertness: Awake/alert Behavior During Therapy: WFL for tasks assessed/performed Overall Cognitive Status: Within Functional Limits for tasks assessed    Balance     End of Session PT - End of Session Equipment Utilized During Treatment: Gait belt;Back brace Activity Tolerance: Treatment limited secondary to medical complications (Comment) Patient left: with call bell/phone within reach;with family/visitor present;in bed   GP     Melissa Mcclure,Melissa Mcclure 04/19/2013, 10:36 AM Pager [phone]

## 2013-04-19 NOTE — Progress Notes (Signed)
Subjective: Patient reports Feels weak and faint when she gets up. Blood pressure is been on low side  Objective: Vital signs in last 24 hours: Temp:  [97.9 F (36.6 C)-98.3 F (36.8 C)] 97.9 F (36.6 C) (07/27 1045) Pulse Rate:  [70-81] 74 (07/27 1045) Resp:  [20] 20 (07/27 1045) BP: (85-98)/(40-52) 92/44 mmHg (07/27 1045) SpO2:  [98 %-100 %] 100 % (07/27 1045)  Intake/Output from previous day: 07/26 0701 - 07/27 0700 In: 603 [P.O.:600; I.V.:3] Out: 2600 [Urine:2600] Intake/Output this shift: Total I/O In: 120 [P.O.:120] Out: -   Incisions are clean and dry dressings are intact. Motor function appears intact.  Lab Results: No results found for this basename: WBC, HGB, HCT, PLT,  in the last 72 hours BMET No results found for this basename: NA, K, CL, CO2, GLUCOSE, BUN, CREATININE, CALCIUM,  in the last 72 hours  Studies/Results: Dg Lumbar Spine 2-3 Views  04/17/2013   *RADIOLOGY REPORT*  Clinical Data: Lumbar fusion L2-S1.  DG C-ARM GT 120 MIN,LUMBAR SPINE - 2-3 VIEW  Technique: Technique:  C-arm fluoroscopic images were obtained intraoperatively and submitted for postoperative interpretation. Please see the performing provider's procedural report for the fluoroscopy time utilized.  Comparison:  Lumbar MRI 08/16/2012.  Office radiographs 07/28/2012.  Findings: Spot PA and lateral fluoroscopic images of the lower lumbar spine demonstrate lumbar fusion from L2-S1.  Interbody spacers at each levels appear well positioned.  There are bilateral pedicle screws at L2, L5 and S1.  There is a right L3 and a left L4 pedicle screw.  No complications are identified.  IMPRESSION: Intraoperative views following L2-S1 fusion.  No demonstrated complication.   Original Report Authenticated By: Carey Bullocks, M.D.   Dg C-arm Gt 120 Min  04/17/2013   *RADIOLOGY REPORT*  Clinical Data: Lumbar fusion L2-S1.  DG C-ARM GT 120 MIN,LUMBAR SPINE - 2-3 VIEW  Technique: Technique:  C-arm fluoroscopic images  were obtained intraoperatively and submitted for postoperative interpretation. Please see the performing provider's procedural report for the fluoroscopy time utilized.  Comparison:  Lumbar MRI 08/16/2012.  Office radiographs 07/28/2012.  Findings: Spot PA and lateral fluoroscopic images of the lower lumbar spine demonstrate lumbar fusion from L2-S1.  Interbody spacers at each levels appear well positioned.  There are bilateral pedicle screws at L2, L5 and S1.  There is a right L3 and a left L4 pedicle screw.  No complications are identified.  IMPRESSION: Intraoperative views following L2-S1 fusion.  No demonstrated complication.   Original Report Authenticated By: Carey Bullocks, M.D.    Assessment/Plan: Possible anemia postop.  LOS: 2 days  ccontinue IV fluid. Recheck CBC.   Cormick Moss J 04/19/2013, 12:15 PM

## 2013-04-20 NOTE — Progress Notes (Signed)
Subjective: Patient reports still light headed when up, but improved  Objective: Vital signs in last 24 hours: Temp:  [97.9 F (36.6 C)-98.7 F (37.1 C)] 98.4 F (36.9 C) (07/28 0500) Pulse Rate:  [74-100] 76 (07/28 0500) Resp:  [20] 20 (07/28 0500) BP: (90-117)/(44-61) 90/50 mmHg (07/28 0500) SpO2:  [95 %-100 %] 95 % (07/28 0500)  Intake/Output from previous day: 07/27 0701 - 07/28 0700 In: 680 [P.O.:680] Out: 450 [Urine:450] Intake/Output this shift:    Physical Exam: Strength full.  Dressings CDI.  Lab Results:  Recent Labs  04/19/13 1230  WBC 9.8  HGB 9.7*  HCT 27.7*  PLT 163   BMET No results found for this basename: NA, K, CL, CO2, GLUCOSE, BUN, CREATININE, CALCIUM,  in the last 72 hours  Studies/Results: No results found.  Assessment/Plan: Continue to mobilize today with PT.  Will work on bowels.    LOS: 3 days    Dorian Heckle, MD 04/20/2013, 8:20 AM

## 2013-04-20 NOTE — Care Management Note (Unsigned)
    Page 1 of 2   04/20/2013     11:32:43 AM   CARE MANAGEMENT NOTE 04/20/2013  Patient:  Melissa Mcclure, Melissa Mcclure   Account Number:  [id number]  Date Initiated:  04/20/2013  Documentation initiated by:  Elmer Bales  Subjective/Objective Assessment:   Patient admitted for lumbar fusion with PLIF     Action/Plan:   Will follow for discharge needs.   Anticipated DC Date:  04/22/2013   Anticipated DC Plan:  HOME W HOME HEALTH SERVICES      DC Planning Services  CM consult      Choice offered to / List presented to:  C-1 Patient   DME arranged  3-N-1      DME agency  [facility]     HH arranged  HH-2 PT      [facility] agency  [facility]   Status of service:  Completed, signed off Medicare Important Message given?   (If response is "NO", the following Medicare IM given date fields will be blank) Date Medicare IM given:   Date Additional Medicare IM given:    Discharge Disposition:    Per UR Regulation:  Reviewed for med. necessity/level of care/duration of stay  If discussed at Long Length of Stay Meetings, dates discussed:    Comments:  04/20/13 1115 Elmer Bales RN, MSN, CM- Met with patient to discuss home health needs.  Pt has chosen to use [facility] for HHPT.  Debbie with [facility] was notified and accepted the referral.  Order was placed for 3N1 per therapy recommendations.

## 2013-04-20 NOTE — Progress Notes (Signed)
Occupational Therapy Treatment Patient Details Name: Melissa Mcclure MRN: [id number] DOB: 1956/01/18 Today's Date: 04/20/2013 Time: 7846-9629 OT Time Calculation (min): 17 min  OT Assessment / Plan / Recommendation  History of present illness pt adm for anteriolateral L2-5 lumbar fusion with PLIF L5-S1   Clinical Impression Pt progressing towards goals. Pt educated on cup method and setting items up on right to prevent twisting/bending. Pt able to cross LE for LB dressing with incr time. Pt continues to benefit from skilled OT in the acute setting before d/c home with HHOT and 24 hour supervision.      Follow Up Recommendations  Home health OT;Supervision/Assistance - 24 hour       Equipment Recommendations  3 in 1 bedside comode       Frequency Min 2X/week   Progress towards OT Goals Progress towards OT goals: Progressing toward goals  Plan Discharge plan needs to be updated    Precautions / Restrictions Precautions Precautions: Back Precaution Booklet Issued: Yes (comment) Precaution Comments: Pt able to recall 3/3 precautions. Required Braces or Orthoses: Spinal Brace Spinal Brace: Applied in sitting position Restrictions Weight Bearing Restrictions: No   Pertinent Vitals/Pain Pt reported 7/10 pain. Pt repositioned in bed and RN notified that Pt requests muscle relaxers.    ADL  Grooming: Wash/dry face;Teeth care;Brushing hair;Set up Where Assessed - Grooming: Supported standing Lower Body Dressing: Supervision/safety (crossing LE with incr time) Where Assessed - Lower Body Dressing: Supported sitting Tub/Shower Transfer: Min guard Tub/Shower Transfer Method: Science writer: Walk in shower Equipment Used: Back brace;Gait belt;Rolling walker Transfers/Ambulation Related to ADLs: Pt min guard for transfers with min vc's for safe hand placement. Pt min guard ambulating with RW. ADL Comments: Min guard with verbal confirmation of sequencing to don  back brace while sitting EOB. Pt able to cross ankles over knees to don socks but with some incr time due to sore/stiff.  Instructed pt to use this technique for LB bathing/dressing. Also recommended pt use 3n1 as a shower chair at home as well as over toilet.     OT Goals(current goals can now be found in the care plan section) Acute Rehab OT Goals Patient Stated Goal: to return home OT Goal Formulation: With patient Time For Goal Achievement: 04/25/13 Potential to Achieve Goals: Good ADL Goals Pt Will Perform Grooming: with modified independence;standing Pt Will Perform Lower Body Bathing: with supervision;with adaptive equipment;sit to/from stand Pt Will Perform Lower Body Dressing: with min assist;with adaptive equipment;sit to/from stand Pt Will Transfer to Toilet: with supervision;ambulating;regular height toilet;bedside commode Pt Will Perform Toileting - Clothing Manipulation and hygiene: with supervision;sit to/from stand Pt Will Perform Tub/Shower Transfer: Shower transfer;with supervision;3 in 1;rolling walker;ambulating  Visit Information  Last OT Received On: 04/20/13 Assistance Needed: +1 PT/OT Co-Evaluation/Treatment: Yes History of Present Illness: pt adm for anteriolateral L2-5 lumbar fusion with PLIF L5-S1          Cognition  Cognition Arousal/Alertness: Awake/alert Behavior During Therapy: WFL for tasks assessed/performed Overall Cognitive Status: Within Functional Limits for tasks assessed    Mobility  Bed Mobility Bed Mobility: Sit to Sidelying Left;Rolling Right;Sitting - Scoot to Edge of Bed Rolling Right: 5: Supervision;With rail Rolling Left: 5: Supervision Left Sidelying to Sit: 4: Min guard;HOB flat Sitting - Scoot to Edge of Bed: 5: Supervision Sit to Sidelying Left: 4: Min guard;With rail;HOB flat Details for Bed Mobility Assistance: vc for technique to maintian back precautions Transfers Transfers: Sit to Stand;Stand to Sit Sit to Stand: 4:  Min  guard;From bed;With upper extremity assist Stand to Sit: 5: Supervision;With upper extremity assist;To bed Details for Transfer Assistance: vc for safe use of RW/hand placement--esp stand to sit          End of Session OT - End of Session Equipment Utilized During Treatment: Gait belt;Rolling walker;Back brace Activity Tolerance: Patient limited by fatigue Patient left: in bed;with call bell/phone within reach Nurse Communication: Mobility status;Patient requests pain meds (muscle relaxers)  GO     Sherryl Manges 04/20/2013, 10:53 AM

## 2013-04-20 NOTE — Progress Notes (Signed)
I agree with the following treatment note after reviewing documentation.   Johnston, Joshoa Shawler Brynn   OTR/L Pager: [phone] Office: [phone] .   

## 2013-04-20 NOTE — Progress Notes (Signed)
Physical Therapy Treatment Patient Details Name: Melissa Mcclure MRN: [id number] DOB: Oct 09, 1955 Today's Date: 04/20/2013 Time: 4098-1191 PT Time Calculation (min): 30 min  PT Assessment / Plan / Recommendation  History of Present Illness pt adm for anteriolateral L2-5 lumbar fusion with PLIF L5-S1   Clinical Impression Pt pre-medicated 1 1/2 hrs prior to session with pain medicine only (had been also using muscle relaxer and anti-nausea meds) and was less lightheaded today. Still requiring incr encouragement to maximize her activity and discussed risks of constipation and blood clots related to inactivity. Patient needs to practice stairs next session.      PT Comments     Follow Up Recommendations  Home health PT;Supervision for mobility/OOB;Other (comment) (pt now agreeable to HHPT)     Does the patient have the potential to tolerate intense rehabilitation     Barriers to Discharge        Equipment Recommendations  None recommended by PT    Recommendations for Other Services    Frequency Min 5X/week   Progress towards PT Goals Progress towards PT goals: Progressing toward goals  Plan Discharge plan needs to be updated    Precautions / Restrictions Precautions Precautions: Back Precaution Booklet Issued: Yes (comment) Precaution Comments: pt able to state 2/3 and recalled 3rd with cues Required Braces or Orthoses: Spinal Brace Spinal Brace: Applied in sitting position   Pertinent Vitals/Pain 6-7/10 low back and abdomen; patient repositioned for comfort     Mobility  Bed Mobility Bed Mobility: Sit to Sidelying Left;Rolling Right;Rolling Left;Left Sidelying to Sit;Sitting - Scoot to [place] of Bed Rolling Right: 5: Supervision Rolling Left: 5: Supervision Left Sidelying to Sit: 4: Min guard;HOB flat Sitting - Scoot to Edge of Bed: 6: Modified independent (Device/Increase time) Sit to Sidelying Left: 4: Min guard;HOB flat Details for Bed Mobility Assistance: vc for technique  to maintian back precautions;  Transfers Transfers: Sit to Stand;Stand to Sit Sit to Stand: With upper extremity assist;5: Supervision Stand to Sit: With upper extremity assist;5: Supervision Details for Transfer Assistance: vc for safe use of RW/hand placement--esp stand to sit; Ambulation/Gait Ambulation/Gait Assistance: 4: Min guard Ambulation Distance (Feet): 110 Feet Assistive device: Rolling walker Ambulation/Gait Assistance Details: vc and tactile cues for upright posture to prevent bending Gait Pattern: Step-through pattern;Decreased stride length Gait velocity: very decr Stairs: No (discussed goal to try stairs tomorrow)    Exercises     PT Diagnosis:    PT Problem List:   PT Treatment Interventions:     PT Goals (current goals can now be found in the care plan section) Acute Rehab PT Goals Patient Stated Goal: go home  PT Goal Formulation: With patient Time For Goal Achievement: 04/21/13 Potential to Achieve Goals: Good  Visit Information  Last PT Received On: 04/20/13 Assistance Needed: +1 PT/OT Co-Evaluation/Treatment: Yes History of Present Illness: pt adm for anteriolateral L2-5 lumbar fusion with PLIF L5-S1    Subjective Data  Subjective: decided to wait and get valium after therapy to try to decr lightheadedness Patient Stated Goal: go home    Cognition  Cognition Arousal/Alertness: Awake/alert Behavior During Therapy: WFL for tasks assessed/performed Overall Cognitive Status: Within Functional Limits for tasks assessed    Balance     End of Session PT - End of Session Equipment Utilized During Treatment: Gait belt;Back brace Activity Tolerance: Patient limited by fatigue Patient left: with call bell/phone within reach;in bed   GP     Melissa Mcclure,Melissa Mcclure 04/20/2013, 10:42 AM Pager [phone]

## 2013-04-21 NOTE — Progress Notes (Addendum)
Physical Therapy Treatment Patient Details Name: Melissa Mcclure MRN: [id number] DOB: [birth year] Today's Date: 04/21/2013 Time: 2956-2130 PT Time Calculation (min): 36 min  PT Assessment / Plan / Recommendation  History of Present Illness pt adm for anteriolateral L2-5 lumbar fusion with PLIF L5-S1   Clinical Impression Pt is progressing well with mobility and was able to do stairs today with min assist.     PT Comments   Next session PT to see in AM for hopeful d/c home to practice stairs and review education with husband present.  Goals assessed and revised today based on progress (and goal due date was today).  Re-assessment due 04/28/13.    Follow Up Recommendations  Home health PT;Supervision for mobility/OOB     Does the patient have the potential to tolerate intense rehabilitation    NA  Barriers to Discharge  none      Equipment Recommendations  None recommended by PT    Recommendations for Other Services   none  Frequency Min 5X/week   Progress towards PT Goals Progress towards PT goals: Progressing toward goals  Plan Discharge plan needs to be updated    Precautions / Restrictions Precautions Precautions: Back Precaution Comments: Pt able to recall 3/3 precautions. Required Braces or Orthoses: Spinal Brace (lumbar corset) Spinal Brace: Applied in sitting position   Pertinent Vitals/Pain See vitals flow sheet.    Mobility  Bed Mobility Rolling Left: 5: Supervision Left Sidelying to Sit: 5: Supervision;HOB elevated;With rails Sitting - Scoot to Edge of Bed: 5: Supervision;With rail Details for Bed Mobility Assistance: No cues needed to correctly maintaing back precautions.  Pt should practive tommorow without railing with HOB flat.   Transfers Sit to Stand: 5: Supervision;With upper extremity assist;From bed;From toilet Stand to Sit: 5: Supervision;With upper extremity assist;With armrests;To chair/3-in-1;To toilet Details for Transfer Assistance: verbal cues for safe  hand placement.   Ambulation/Gait Ambulation/Gait Assistance: 5: Supervision Ambulation Distance (Feet): 150 Feet Assistive device: Rolling walker Ambulation/Gait Assistance Details: verbal cues for upright posture Gait Pattern: Step-through pattern;Shuffle;Trunk flexed Gait velocity: less than 1.8 ft/sec putting her at risk for recurrent falls Stairs: Yes Stairs Assistance: 4: Min assist Stairs Assistance Details (indicate cue type and reason): min assist to support trunk over weak legs both with and without railing.  Stair Management Technique: Two rails;No rails;Forwards Number of Stairs: 5 (two times)    Exercises Other Exercises Other Exercises: incintive spirometer x 5 reps with max volume     PT Goals (current goals can now be found in the care plan section) Acute Rehab PT Goals PT Goal Formulation: With patient Time For Goal Achievement: 04/28/13 Potential to Achieve Goals: Good  Visit Information  Last PT Received On: 04/21/13 Assistance Needed: +1 History of Present Illness: pt adm for anteriolateral L2-5 lumbar fusion with PLIF L5-S1    Subjective Data  Subjective: Pt reports today is not as good (pain) as yesterday and she had a hard time getting comfortable in the bed last night.     Cognition  Cognition Arousal/Alertness: Awake/alert Behavior During Therapy: WFL for tasks assessed/performed Overall Cognitive Status: Within Functional Limits for tasks assessed       End of Session PT - End of Session Equipment Utilized During Treatment: Back brace Activity Tolerance: Patient limited by fatigue;Patient limited by pain Patient left: in chair;with call bell/phone within reach Nurse Communication: Patient requests pain meds       [name] B. Laurieanne Galloway, PT, DPT [phone]   04/21/2013, 12:18 PM

## 2013-04-21 NOTE — Progress Notes (Signed)
Patient ID: Melissa Mcclure, female   DOB: Mar 27, 1956, 57 y.o.   MRN: [id number]  Conversant, family present. Frustrated with lumbar pain at present after sitting in chair >2hrs and requesting pain meds. Family apparently assisted to bed. Reports some dizziness after lying down & rolling onto side (Hx Vertigo). No dizziness when standing or walking from chair. Hopeful of d/c to home tomorrow.  Georgiann Cocker, RN, BSN

## 2013-04-21 NOTE — Progress Notes (Signed)
Patient ID: Melissa Mcclure, female   DOB: 1956/01/13, 57 y.o.   MRN: [id number]  Sitting in chair eating lunch. "I don't feel quite as strong as yesterday, but I was able to do the stairs for PT & walk back to my room" IVF resumed for BP in 90's. Will continue to monitor.  Georgiann Cocker, RN, BSN

## 2013-04-21 NOTE — Progress Notes (Signed)
Subjective: Patient reports "I'm trying to push myself." "I feel some better"  Objective: Vital signs in last 24 hours: Temp:  [97.2 F (36.2 C)-99.3 F (37.4 C)] 98.7 F (37.1 C) (07/29 0603) Pulse Rate:  [84-99] 84 (07/29 0603) Resp:  [18-20] 19 (07/29 0603) BP: (97-116)/(49-68) 103/56 mmHg (07/29 0603) SpO2:  [94 %-100 %] 98 % (07/29 0603)  Intake/Output from previous day:   Intake/Output this shift:    Alert, conversant. No c/o pain at present, only frustration with what she perceives as slow progress. Improving appetite. BM yesterday. Less lightheadedness by late yesterday when walking. Incisions all without erythema, swelling, or drainage. Drsgs intact. Good strength BLE.  Lab Results:  Recent Labs  04/19/13 1230  WBC 9.8  HGB 9.7*  HCT 27.7*  PLT 163   BMET No results found for this basename: NA, K, CL, CO2, GLUCOSE, BUN, CREATININE, CALCIUM,  in the last 72 hours  Studies/Results: No results found.  Assessment/Plan: Improving.    LOS: 4 days  Reassured pt ZO:[id number]. Continue to mobilize in LSO. Will monitor BP's.    Georgiann Cocker 04/21/2013, 7:48 AM

## 2013-04-22 MED ORDER — OXYCODONE-ACETAMINOPHEN 5-325 MG PO TABS: 1.0000 | ORAL_TABLET | ORAL | Status: DC | PRN

## 2013-04-22 MED ORDER — DIAZEPAM 5 MG PO TABS: 5.0000 mg | ORAL_TABLET | Freq: Four times a day (QID) | ORAL | Status: DC | PRN

## 2013-04-22 NOTE — Progress Notes (Signed)
Physical Therapy Treatment Patient Details Name: Melissa Mcclure MRN: [id number] DOB: July 04, 1956 Today's Date: 04/22/2013 Time: 5621-3086 PT Time Calculation (min): 31 min  PT Assessment / Plan / Recommendation  History of Present Illness pt adm for anteriolateral L2-5 lumbar fusion with PLIF L5-S1   PT Comments   Pt pleasant & willing to participate in PT session.  Moves fairly well but slowly.  Pt's husband not present for session due to he had to work today but practiced stairs again today with pt & she feels more comfortable with them & being able to instruct her husband on how to assist her.     Follow Up Recommendations  Home health PT;Supervision for mobility/OOB     Does the patient have the potential to tolerate intense rehabilitation     Barriers to Discharge        Equipment Recommendations  3in1 (PT)    Recommendations for Other Services OT consult  Frequency Min 5X/week   Progress towards PT Goals Progress towards PT goals: Progressing toward goals  Plan Current plan remains appropriate    Precautions / Restrictions Precautions Precautions: Back Precaution Comments: Pt able to recall 3/3 precautions. Required Braces or Orthoses: Spinal Brace Spinal Brace: Applied in sitting position;Lumbar corset Restrictions Weight Bearing Restrictions: No   Pertinent Vitals/Pain 6/10 back.  RN administered pain medication    Mobility  Bed Mobility Bed Mobility: Left Sidelying to Sit;Sitting - Scoot to Edge of Bed;Sit to Sidelying Left Left Sidelying to Sit: HOB flat;6: Modified independent (Device/Increase time) Sitting - Scoot to Edge of Bed: 6: Modified independent (Device/Increase time) Sit to Sidelying Left: 4: Min assist;HOB flat Details for Bed Mobility Assistance: Pt performed with proper technique.  Minor assist to lift LE's back into bed.   Transfers Transfers: Sit to Stand;Stand to Sit Sit to Stand: 5: Supervision;With upper extremity assist;With armrests;From  chair/3-in-1;From bed Stand to Sit: 5: Supervision;With upper extremity assist;With armrests;To bed;To chair/3-in-1 Details for Transfer Assistance: cues to reinforce hand placement Ambulation/Gait Ambulation/Gait Assistance: 5: Supervision Ambulation Distance (Feet): 120 Feet Assistive device: Rolling walker Ambulation/Gait Assistance Details: cues for posture & to relax UE's.  Pt c/o LE weakness as distance increased  Gait Pattern: Step-through pattern;Decreased stride length;Trunk flexed (decreased floor clearance) Gait velocity: decreased Stairs: Yes Stairs Assistance: 4: Min assist Stairs Assistance Details (indicate cue type and reason): Assist provided via HHA for support & to help step up to next step due to weak LE's.   Stair Management Technique: No rails;Step to pattern;Forwards Number of Stairs: 4 Wheelchair Mobility Wheelchair Mobility: No      PT Goals (current goals can now be found in the care plan section) Acute Rehab PT Goals Patient Stated Goal: to return home PT Goal Formulation: With patient Time For Goal Achievement: 04/28/13 Potential to Achieve Goals: Good  Visit Information  Last PT Received On: 04/22/13 Assistance Needed: +1 History of Present Illness: pt adm for anteriolateral L2-5 lumbar fusion with PLIF L5-S1    Subjective Data  Patient Stated Goal: to return home   Cognition  Cognition Arousal/Alertness: Awake/alert Behavior During Therapy: WFL for tasks assessed/performed Overall Cognitive Status: Within Functional Limits for tasks assessed    Balance     End of Session PT - End of Session Equipment Utilized During Treatment: Back brace Activity Tolerance: Patient tolerated treatment well Patient left: with call bell/phone within reach;in bed Nurse Communication: Mobility status   GP     Melissa, Mcclure 04/22/2013, 9:30 AM   Melissa Mcclure  Melissa Mcclure, Melissa Mcclure [phone] 04/22/2013

## 2013-04-22 NOTE — Progress Notes (Signed)
Pt for discharge home today with home health PT, IV D/C with dressing CDI to lower back and R flank.   D/C instructions and Rx given with verbalized understanding.  Family at bedside to assist pt with discharge. Staff brought pt downstairs via wheelchair.

## 2013-04-22 NOTE — Progress Notes (Signed)
Subjective: Patient reports "I had another rough night"  Objective: Vital signs in last 24 hours: Temp:  [98.3 F (36.8 C)-99.6 F (37.6 C)] 99.6 F (37.6 C) (07/30 0519) Pulse Rate:  [79-89] 79 (07/30 0519) Resp:  [18] 18 (07/30 0519) BP: (97-116)/(48-73) 116/63 mmHg (07/30 0519) SpO2:  [96 %-100 %] 96 % (07/30 0519)  Intake/Output from previous day: 07/29 0701 - 07/30 0700 In: 480 [P.O.:480] Out: -  Intake/Output this shift:    Sitting in chair. Conversant. Low back pain & bilateral upper thigh pain intermittent. BP normal this am; no reports of dizziness. Incisions without erythema, swelling, or drainage. Good strength BLE.  Lab Results:  Recent Labs  04/19/13 1230  WBC 9.8  HGB 9.7*  HCT 27.7*  PLT 163   BMET No results found for this basename: NA, K, CL, CO2, GLUCOSE, BUN, CREATININE, CALCIUM,  in the last 72 hours  Studies/Results: No results found.  Assessment/Plan: Improving   LOS: 5 days  Likely d/c to home this afternoon, after working with PT once more. Pt verbalizes understanding of d/c instructions & agrees to call office to schedule 3-4 week appt.    Georgiann Cocker 04/22/2013, 8:25 AM

## 2013-04-22 NOTE — Discharge Summary (Signed)
Physician Discharge Summary  Patient ID: Melissa Mcclure MRN: [id number] DOB/AGE: 26-May-1956 57 y.o.  Admit date: 04/17/2013 Discharge date: 04/22/2013  Admission Diagnoses: Scoliosis, Spondylolisthesis, Lumbar spondylolysis, Lumbar radiculopathy L 23, L 34, L 45, L 5 S1    Discharge Diagnoses: Scoliosis, Spondylolisthesis, Lumbar spondylolysis, Lumbar radiculopathy L 23, L 34, L 45, L 5 S1 s/p ANTERIOR LATERAL LUMBAR FUSION 3 LEVELS (Right) - Right Lumbar Two-Three Lumbar Three-Four Lumbar Four-Five  Anterolateral Fusion LUMBAR PERCUTANEOUS PEDICLE SCREW 3 LEVEL (N/A) - Decompression and Posterior Lumbar Interbody Fusion of Lumbar Five-Sacral One, Percutaneous Screws Lumbar Two through Sacral One L5 Gill with PLIF L 5 S 1 with posterolateral arthrodesis   Active Problems:   * No active hospital problems. *   Discharged Condition: good  Hospital Course: Melissa Mcclure was admitted for surgery with Dx scoliosis, spondylolisthesis, & radiculopathy.  Following uncomplicated surgery, she recovered well in Neuro PACU & transferred to 4N for nursing & Therapy care.  She has progressed slowly, but steadily.   Consults: None  Significant Diagnostic Studies: radiology: X-Ray: intra-operative  Treatments: surgery: ANTERIOR LATERAL LUMBAR FUSION 3 LEVELS (Right) - Right Lumbar Two-Three Lumbar Three-Four Lumbar Four-Five  Anterolateral Fusion LUMBAR PERCUTANEOUS PEDICLE SCREW 3 LEVEL (N/A) - Decompression and Posterior Lumbar Interbody Fusion of Lumbar Five-Sacral One, Percutaneous Screws Lumbar Two through Sacral One L5 Gill with PLIF L 5 S 1 with posterolateral arthrodesis    Discharge Exam: Blood pressure 116/63, pulse 79, temperature 99.6 F (37.6 C), temperature source Oral, resp. rate 18, height 5\' 3"  (1.6 m), weight 62.687 kg (138 lb 3.2 oz), SpO2 96.00%. Sitting in chair. Conversant. Low back pain & bilateral upper thigh pain intermittent. BP normal this am; no reports of dizziness. Incisions  without erythema, swelling, or drainage. Good strength BLE.    Disposition: Discharge to home. Pt verbalizes understanding of d/c instructions & agrees to call office to schedule 3-4 week f/u appt.    Future Appointments Provider Department Dept Phone   12/18/2013 2:45 PM Annamaria Boots, MD [facility] [phone]       Medication List    ASK your doctor about these medications       acetaminophen 500 MG tablet  Commonly known as:  TYLENOL  Take 500 mg by mouth every 6 (six) hours as needed for pain.     ALIVE WOMENS 50+ PO  Take 1 tablet by mouth daily.     aspirin 81 MG tablet  Take 81 mg by mouth daily.     ASTRAGALUS PO  Take 500 mg by mouth daily.     calcium carbonate 750 MG chewable tablet  Commonly known as:  TUMS EX  Chew 2 tablets by mouth at bedtime.     CALCIUM MAGNESIUM PO  Take 1 tablet by mouth daily.     cetirizine 10 MG tablet  Commonly known as:  ZYRTEC  Take 10 mg by mouth daily.     estradiol 0.0375 MG/24HR  Commonly known as:  VIVELLE-DOT  Place 1 patch onto the skin 2 (two) times a week.     famotidine 10 MG tablet  Commonly known as:  PEPCID  Take 10 mg by mouth 2 (two) times daily as needed for heartburn.     HYDROcodone-acetaminophen 10-325 MG per tablet  Commonly known as:  NORCO  Take 1 tablet by mouth every 6 (six) hours as needed for pain.     meclizine 25 MG tablet  Commonly known as:  ANTIVERT  Take 25 mg by mouth 3 (three) times daily as needed for dizziness.     meloxicam 15 MG tablet  Commonly known as:  MOBIC  Take 15 mg by mouth every other day.     olmesartan-hydrochlorothiazide 20-12.5 MG per tablet  Commonly known as:  BENICAR HCT  Take 1 tablet by mouth daily.     pantoprazole 40 MG tablet  Commonly known as:  PROTONIX  Take 40 mg by mouth daily.     progesterone 100 MG capsule  Commonly known as:  PROMETRIUM  Take 100 mg by mouth daily.     vitamin B-12 1000 MCG tablet  Commonly  known as:  CYANOCOBALAMIN  Take 1,000 mcg by mouth daily.     vitamin C with rose hips 1000 MG tablet  Take 1,000 mg by mouth daily.     Vitamin D3 5000 UNITS Caps  Take 1 capsule by mouth daily.     vitamin E 400 UNIT capsule  Generic drug:  vitamin E  Take 400 Units by mouth daily.         Signed: Georgiann Cocker 04/22/2013, 8:34 AM

## 2013-04-22 NOTE — Progress Notes (Signed)
Occupational Therapy Treatment Patient Details Name: Melissa Mcclure MRN: [id number] DOB: [birth year] Today's Date: 04/22/2013 Time: 5621-3086 OT Time Calculation (min): 27 min  OT Assessment / Plan / Recommendation  History of present illness pt adm for anteriolateral L2-5 lumbar fusion with PLIF L5-S1   OT comments  Pt progressing towards goals. Pt educated on LB dressing and bathing, and AE education as well as maintaining precautions and getting on a schedule to manage medications. Pt continues to benefit from skilled OT in the acute setting before d/c home with HHOT and 24 hour supervision/assistance   Follow Up Recommendations  Home health OT;Supervision/Assistance - 24 hour       Equipment Recommendations  3 in 1 bedside comode       Frequency Min 2X/week   Progress towards OT Goals Progress towards OT goals: Progressing toward goals  Plan Discharge plan remains appropriate    Precautions / Restrictions Precautions Precautions: Back Precaution Comments: Pt able to recall 3/3 precautions. Required Braces or Orthoses: Spinal Brace Spinal Brace: Applied in sitting position;Lumbar corset Restrictions Weight Bearing Restrictions: No   Pertinent Vitals/Pain Pt reported 6/10 pain. RN notified that Pt is due for pain medication at end of the session    ADL  Eating/Feeding: Performed;Independent Where Assessed - Eating/Feeding: Chair Lower Body Bathing: Supervision/safety Where Assessed - Lower Body Bathing: Supported sit to stand Upper Body Dressing: Supervision/safety Where Assessed - Upper Body Dressing: Unsupported sitting Lower Body Dressing: Supervision/safety (crossing LE with incr time, and AE) Where Assessed - Lower Body Dressing: Supported sitting Tub/Shower Transfer Equipment: Other (comment) (educated about using 3in1 in shower) Equipment Used: Back brace;Long-handled sponge;Reacher Transfers/Ambulation Related to ADLs: not assessed ADL Comments: Pt educated on  medicine management and staying ahead of pain. OTS suggested setting an alarm to wake up in the middle of the night to take medicine. OTS also suggested writing down a medicine schedule. Pt agreeable and verbalizes understanding. Pt then educated and practiced LB dressing/bathing. Pt able to cross LE for LB dressing, but Pt also educated on Water quality scientist. Pt able to verbalize and demonstrate proper use. Pt also educated on showering and using 3in1 and AE to wash LB.      OT Goals(current goals can now be found in the care plan section) Acute Rehab OT Goals Patient Stated Goal: to return home OT Goal Formulation: With patient Time For Goal Achievement: 04/25/13 Potential to Achieve Goals: Good ADL Goals Pt Will Perform Grooming: with modified independence;standing Pt Will Perform Lower Body Bathing: with supervision;with adaptive equipment;sit to/from stand Pt Will Perform Lower Body Dressing: with min assist;with adaptive equipment;sit to/from stand Pt Will Transfer to Toilet: with supervision;ambulating;regular height toilet;bedside commode Pt Will Perform Toileting - Clothing Manipulation and hygiene: with supervision;sit to/from stand Pt Will Perform Tub/Shower Transfer: Shower transfer;with supervision;3 in 1;rolling walker;ambulating  Visit Information  Last OT Received On: 04/22/13 Assistance Needed: +1 History of Present Illness: pt adm for anteriolateral L2-5 lumbar fusion with PLIF L5-S1          Cognition  Cognition Arousal/Alertness: Awake/alert Behavior During Therapy: WFL for tasks assessed/performed Overall Cognitive Status: Within Functional Limits for tasks assessed    Mobility  Bed Mobility Bed Mobility: Not assessed Transfers Transfers: Not assessed             GO     Sherryl Manges 04/22/2013, 8:53 AM

## 2013-04-22 NOTE — Progress Notes (Signed)
Doing well  DC home

## 2013-04-22 NOTE — Progress Notes (Signed)
I agree with the following treatment note after reviewing documentation.   Johnston, Saranya Harlin Brynn   OTR/L Pager: [phone] Office: [phone] .   

## 2013-04-24 ENCOUNTER — Encounter (HOSPITAL_COMMUNITY): Payer: Self-pay | Admitting: Neurosurgery

## 2013-06-19 ENCOUNTER — Encounter: Payer: Self-pay | Admitting: Obstetrics & Gynecology

## 2013-07-07 ENCOUNTER — Telehealth: Payer: Self-pay | Admitting: Obstetrics & Gynecology

## 2013-07-07 NOTE — Telephone Encounter (Signed)
Patient taking Vivelle .075 but is interested in generic Climara (estradiol patch) instead, if possible, to help with costs. [facility] Patient also has some questions about hormones.

## 2013-07-07 NOTE — Telephone Encounter (Signed)
Spoke with pt about switching her patch to generic estradiol to save money. She thinks it would be $12 instead of $50 through [facility]. Pt also wondering about Brisdelle she has seen advertised lately. Would that be an option for her? If so, would she need to take progesterone with it? Pt likes that it is non-hormonal, but knows it might be costly.

## 2013-07-07 NOTE — Telephone Encounter (Signed)
Spoke with pt who just wants to try the generic estradiol patch for now. Pt will look into the Brisdelle and talk about it at her Aex appt 09-14-13. [facility] 90 day supply.

## 2013-07-07 NOTE — Telephone Encounter (Signed)
Brisdell is an SSRI, not HRT.  It is Paxil.  I am fine with her trying it she wants to stop the HRT.  Please see what she wants.

## 2013-07-08 MED ORDER — ESTRADIOL 0.0375 MG/24HR TD PTWK: 1.0000 | MEDICATED_PATCH | TRANSDERMAL | Status: DC

## 2013-07-08 MED ORDER — ESTRADIOL 0.0375 MG/24HR TD PTTW: 1.0000 | MEDICATED_PATCH | TRANSDERMAL | Status: DC

## 2013-07-08 NOTE — Telephone Encounter (Addendum)
Routed to Sally 

## 2013-07-08 NOTE — Telephone Encounter (Signed)
Dr. Hyacinth Meeker can you place order for Climara [facility] if you agree?

## 2013-07-08 NOTE — Telephone Encounter (Signed)
Call to patient to confirm that she wasn't Climara due to cost even though on Vivelle dot.  Patient states she was told Vivelle dot does not have a generic but Climara does.  Call to two different pharmacies, [facility] and [address]. Generic Vivelle no longer available and no generic for Vivelle DOT.  Only generic would be for once a week Climara.  Patient would like to try this. [facility]

## 2013-07-08 NOTE — Telephone Encounter (Signed)
done

## 2013-07-08 NOTE — Addendum Note (Signed)
Addended by: Alisa Graff on: 07/08/2013 04:52 PM   Modules accepted: Orders

## 2013-07-09 ENCOUNTER — Telehealth: Payer: Self-pay | Admitting: *Deleted

## 2013-07-09 MED ORDER — ESTRADIOL 0.0375 MG/24HR TD PTWK: 1.0000 | MEDICATED_PATCH | TRANSDERMAL | Status: DC

## 2013-07-09 NOTE — Telephone Encounter (Signed)
Previous RX for Climara was only for one month supply and should be for 3 months.  [facility] and notified Lelon Mast that RX should be for 3 months, 12 patches.  They will cancel the one month RX but we will have to enter a new 3 month Rx. Patient notified that Climara has been sent and instructed this is once weekly as opposed to twice week like Vivelle.  One 3 month supply sent and to review with Dr Hyacinth Meeker at [facility] in Dec.

## 2013-08-14 ENCOUNTER — Encounter: Payer: Self-pay | Admitting: Obstetrics & Gynecology

## 2013-08-18 ENCOUNTER — Telehealth: Payer: Self-pay | Admitting: Obstetrics & Gynecology

## 2013-08-18 NOTE — Telephone Encounter (Signed)
I attempted to call patient, she was on the other line with [place] and says she is taken care of.

## 2013-08-18 NOTE — Telephone Encounter (Signed)
Patient calling re: Dr. Hyacinth Meeker cancel/reschedule for AEX. This patient wants to be seen this year. Patient states Dr. Hyacinth Meeker told her she should not go beyond a year to be seen. Patient's AEX was rescheduled to 10/12/13 with Dr. Hyacinth Meeker due to a schedule conflict.

## 2013-08-18 NOTE — Telephone Encounter (Signed)
Called and spoke with patient to let her know she was seen 09/18/13 last year. The patient has the same insurance and is aware it must be 366 days since last visit for next visit. Patient is fine to see Dr. Hyacinth Meeker 10/12/13 as she has the day off for a holiday.

## 2013-09-14 ENCOUNTER — Ambulatory Visit: Payer: Self-pay | Admitting: Obstetrics & Gynecology

## 2013-10-09 ENCOUNTER — Encounter: Payer: Self-pay | Admitting: Obstetrics & Gynecology

## 2013-10-12 ENCOUNTER — Encounter: Payer: Self-pay | Admitting: Obstetrics & Gynecology

## 2013-10-12 ENCOUNTER — Ambulatory Visit (INDEPENDENT_AMBULATORY_CARE_PROVIDER_SITE_OTHER): Payer: BC Managed Care – PPO | Admitting: Obstetrics & Gynecology

## 2013-10-12 VITALS — BP 106/62 | HR 60 | Resp 16 | Ht 63.5 in | Wt 135.4 lb

## 2013-10-12 DIAGNOSIS — Z0000 Encounter for general adult medical examination without abnormal findings: Secondary | ICD-10-CM

## 2013-10-12 DIAGNOSIS — Z85820 Personal history of malignant melanoma of skin: Secondary | ICD-10-CM

## 2013-10-12 DIAGNOSIS — I10 Essential (primary) hypertension: Secondary | ICD-10-CM | POA: Insufficient documentation

## 2013-10-12 DIAGNOSIS — C4361 Malignant melanoma of right upper limb, including shoulder: Secondary | ICD-10-CM

## 2013-10-12 DIAGNOSIS — Z01419 Encounter for gynecological examination (general) (routine) without abnormal findings: Secondary | ICD-10-CM

## 2013-10-12 DIAGNOSIS — Z124 Encounter for screening for malignant neoplasm of cervix: Secondary | ICD-10-CM

## 2013-10-12 DIAGNOSIS — K219 Gastro-esophageal reflux disease without esophagitis: Secondary | ICD-10-CM

## 2013-10-12 HISTORY — DX: Malignant melanoma of right upper limb, including shoulder: C43.61

## 2013-10-12 MED ORDER — PROGESTERONE MICRONIZED 100 MG PO CAPS: 100.0000 mg | ORAL_CAPSULE | Freq: Every day | ORAL | Status: DC

## 2013-10-12 MED ORDER — PAROXETINE HCL 10 MG PO TABS: 10.0000 mg | ORAL_TABLET | ORAL | Status: DC

## 2013-10-12 MED ORDER — ESTRADIOL 0.0375 MG/24HR TD PTWK: 0.0375 mg | MEDICATED_PATCH | TRANSDERMAL | Status: DC

## 2013-10-12 NOTE — Progress Notes (Signed)
58 y.o. G3P3 MarriedCaucasianF here for annual exam.  Doing well.  No vaginal bleeding.  Daughter with [employer]  Had colectomy 1 year ago.  Going to [facility].  She lives in [place].    Had back surgery 7/14.  Was out of work until end of December.  Feeling pretty good.    Sees dermatologist every 6 months--alternatives between Dr. Nevada Crane and [facility].    Patient's last menstrual period was 06/24/2008.          Sexually active: yes  The current method of family planning is tubal ligation.    Exercising: no  not regularly-some exercises with back surgery Smoker:  no  Health Maintenance: Pap:  08/06/11 WNL/negative HR HPV History of abnormal Pap:  no MMG:  10/03/12 normal-scheduled for 10/17/13 Colonoscopy:  2010 due this year for endo and colonoscopy, Dr. Watt Climes BMD:   08/20/12, 0.8/-0.4 TDaP:  06/10/06 Screening Labs: Dr. Alyson Ingles in 11/15, Hb today: with Dr. Alyson Ingles, Urine today: WBC-2+   reports that she has never smoked. She has never used smokeless tobacco. She reports that she does not drink alcohol or use illicit drugs.  Past Medical History  Diagnosis Date  . Complication of anesthesia   . PONV (postoperative nausea and vomiting)     "zofran works great  . Heart murmur   . Hypertension   . Vertigo   . GERD (gastroesophageal reflux disease)   . Shortness of breath     occ  . H/O hiatal hernia   . Cancer 08,11    melanoma-lft knee, lft elbow  . Acid reflux     GERD    Past Surgical History  Procedure Laterality Date  . Melanoma excision      knee 08 lft. elbow 11rt  . Shoulder surgery Right     pins  . Anterior lat lumbar fusion Right 04/17/2013    Procedure: ANTERIOR LATERAL LUMBAR FUSION 3 LEVELS;  Surgeon: Erline Levine, MD;  Location[facility];  Service: Neurosurgery;  Laterality: Right;  Right Lumbar Two-Three Lumbar Three-Four Lumbar Four-Five  Anterolateral Fusion  . Lumbar percutaneous pedicle screw 3 level N/A 04/17/2013    Procedure: LUMBAR  PERCUTANEOUS PEDICLE SCREW 3 LEVEL;  Surgeon: Erline Levine, MD;  Location: [place] [facility];  Service: Neurosurgery;  Laterality: N/A;  Decompression and Posterior Lumbar Interbody Fusion of Lumbar Five-Sacral One, Percutaneous Screws Lumbar Two through Sacral One  . Tubal ligation    . Cesarean section      x2  . Foot surgery      early 20s-bone spur removal  . Wle  06/17/07    and left groin lymph node  . Excision of melanoma      right shoulder    Current Outpatient Prescriptions  Medication Sig Dispense Refill  . acetaminophen (TYLENOL) 500 MG tablet Take 500 mg by mouth every 6 (six) hours as needed for pain.      Marland Kitchen amitriptyline (ELAVIL) 25 MG tablet Take 25 mg by mouth. Taking 50mg  daily (temporary medication for back surgery)      . Ascorbic Acid (VITAMIN C WITH ROSE HIPS) 1000 MG tablet Take 1,000 mg by mouth daily.      Marland Kitchen aspirin 81 MG tablet Take 81 mg by mouth daily.      . calcium carbonate (TUMS EX) 750 MG chewable tablet Chew 2 tablets by mouth at bedtime.      . Calcium-Magnesium-Vitamin D (CALCIUM MAGNESIUM PO) Take 1 tablet by mouth daily.      Marland Kitchen  cetirizine (ZYRTEC) 10 MG tablet Take 10 mg by mouth daily.      . Cholecalciferol (VITAMIN D3) 5000 UNITS CAPS Take 1 capsule by mouth daily.      Marland Kitchen estradiol (CLIMARA - DOSED IN MG/24 HR) 0.0375 mg/24hr patch Place 1 patch (0.0375 mg total) onto the skin once a week.  12 patch  0  . famotidine (PEPCID) 10 MG tablet Take 10 mg by mouth 2 (two) times daily as needed for heartburn.      Marland Kitchen ibuprofen (ADVIL,MOTRIN) 200 MG tablet Take 200 mg by mouth every 6 (six) hours as needed.      Marland Kitchen losartan-hydrochlorothiazide (HYZAAR) 100-12.5 MG per tablet Take 1 tablet by mouth daily.      . meclizine (ANTIVERT) 25 MG tablet Take 25 mg by mouth 3 (three) times daily as needed for dizziness.      . Multiple Vitamins-Minerals (ALIVE WOMENS 50+ PO) Take 1 tablet by mouth daily.      . NON FORMULARY Immune complete      . pantoprazole (PROTONIX) 40  MG tablet Take 40 mg by mouth daily.      . progesterone (PROMETRIUM) 100 MG capsule Take 100 mg by mouth daily.      . vitamin B-12 (CYANOCOBALAMIN) 1000 MCG tablet Take 1,000 mcg by mouth daily.      . vitamin E (VITAMIN E) 400 UNIT capsule Take 400 Units by mouth daily.      . meloxicam (MOBIC) 15 MG tablet Take 15 mg by mouth every other day.       No current facility-administered medications for this visit.    Family History  Problem Relation Age of Onset  . Diabetes Father     borderline  . Melanoma Father   . Colon cancer Mother   . Cancer Maternal Grandmother     "female"  . Hypertension Mother   . Congestive Heart Failure Father   . Heart disease Paternal Grandfather   . Osteoporosis Mother     ROS:  Pertinent items are noted in HPI.  Otherwise, a comprehensive ROS was negative.  Exam:   BP 106/62  Pulse 60  Resp 16  Ht 5' 3.5" (1.613 m)  Wt 135 lb 6.4 oz (61.417 kg)  BMI 23.61 kg/m2  LMP 06/24/2008  Weight change: -2 lbs   Height: 5' 3.5" (161.3 cm)  Ht Readings from Last 3 Encounters:  10/12/13 5' 3.5" (1.613 m)  04/17/13 5\' 3"  (1.6 m)  04/17/13 5\' 3"  (1.6 m)    General appearance: alert, cooperative and appears stated age Head: Normocephalic, without obvious abnormality, atraumatic Neck: no adenopathy, supple, symmetrical, trachea midline and thyroid normal to inspection and palpation Lungs: clear to auscultation bilaterally Breasts: normal appearance, no masses or tenderness Heart: regular rate and rhythm Abdomen: soft, non-tender; bowel sounds normal; no masses,  no organomegaly Extremities: extremities normal, atraumatic, no cyanosis or edema Skin: Skin color, texture, turgor normal. No rashes or lesions Lymph nodes: Cervical, supraclavicular, and axillary nodes normal. No abnormal inguinal nodes palpated Neurologic: Grossly normal   Pelvic: External genitalia:  no lesions              Urethra:  normal appearing urethra with no masses, tenderness  or lesions              Bartholins and Skenes: normal                 Vagina: normal appearing vagina with normal color and discharge, no  lesions              Cervix: no lesions              Pap taken: yes Bimanual Exam:  Uterus:  normal size, contour, position, consistency, mobility, non-tender              Adnexa: normal adnexa and no mass, fullness, tenderness               Rectovaginal: Confirms               Anus:  normal sphincter tone, no lesions  A:  Well Woman with normal exam PMP, on HRT History of melanoma x 2 Family hx of colon cancer  P:   Mammogram scheduled. pap smear today.  HR HPV testing 12/12. Labs with Dr. Alyson Ingles in November or December.  Will have her sign a release. Going to try Paxil 10mg  daily.  Will use for one month.  Then stop HRT.  For now, rx done for Climara 0.0375mg  weekly patch and Prometrium 100mg  daily.  rx to mail order pharmacy.   return annually or prn  An After Visit Summary was printed and given to the patient.

## 2013-10-12 NOTE — Patient Instructions (Signed)

## 2013-10-14 LAB — IPS PAP SMEAR ONLY

## 2013-10-14 NOTE — Telephone Encounter (Signed)
Routed to provider for signature, encounter closed. 

## 2013-10-28 ENCOUNTER — Telehealth: Payer: Self-pay | Admitting: Obstetrics & Gynecology

## 2013-10-28 NOTE — Telephone Encounter (Signed)
Spoke with patient. She wanted to know how long Dr. Sabra Heck wanted her to try both paxil and the patch. Advised per Dr. Ammie Ferrier notes, One month with both paxil and patch.  Patient will call back with update after she dc both patch and prometrium. States she has been on Paxil for one week now and does not feel "any different". She will call back with update.  Routing to provider for final review. Patient agreeable to disposition. Will close encounter

## 2013-10-28 NOTE — Telephone Encounter (Signed)
Patient has a question regarding her current mediation Dr. Sabra Heck recently prescribed. Patient needs help with the directions.

## 2013-12-16 ENCOUNTER — Ambulatory Visit: Payer: Self-pay | Admitting: Obstetrics & Gynecology

## 2013-12-18 ENCOUNTER — Ambulatory Visit: Payer: Self-pay | Admitting: Obstetrics & Gynecology

## 2013-12-24 ENCOUNTER — Telehealth: Payer: Self-pay | Admitting: Obstetrics & Gynecology

## 2013-12-24 MED ORDER — PAROXETINE HCL 10 MG PO TABS: ORAL_TABLET | ORAL | Status: DC

## 2013-12-24 NOTE — Telephone Encounter (Signed)
Estradiol and Progesterone sent 10/02/13 #3 mons/ 4 refills  - Patient would like a Nurse call - Routing to Triage

## 2013-12-24 NOTE — Telephone Encounter (Signed)
Spoke with patient. Patient states that she has been taking Paxil since January and that she is still having some hot flashes but it has improved. Would like to stay on Paxil. Requesting 3 month supply be sent to [facility] as she will be going out of town around the time her current refills run out. Sent in prescription for Paxil 10 mg dispense 90 with 2 refills. Advised [facility]. Patient agreeable and verbalizes understanding.  Routing to provider for final review. Patient agreeable to disposition. Will close encounter

## 2013-12-24 NOTE — Telephone Encounter (Signed)
3 month supply for her hot flashes medication [facility] she would like to talk with the nurse today

## 2014-01-01 ENCOUNTER — Telehealth: Payer: Self-pay | Admitting: Obstetrics & Gynecology

## 2014-01-01 NOTE — Telephone Encounter (Signed)
Patient would like to talk to a nurse regarding her patch. Patient says she used a applied a patch when she was not due. Please call.

## 2014-01-01 NOTE — Telephone Encounter (Signed)
Spoke with patient. Patient states that she came off estradiol patch and progesterone at the end of February and began taking paxil at that time. Has had increased hot flashes for two days that are "unbearable" and had to place estradiol patch back on yesterday morning. States that she does not want to come off of patch any more because of the side effects she was experiencing. Would like to know if Dr.Miller recommends that she continue to take paxil now that she has started back with estradiol and progesterone as she was doing this for a month before switching only to paxil. Advised would check with Dr.Miller and call about with further instructions and advice.  Dr.Miller, how would you like this patient to proceed with medication regimen?

## 2014-01-04 NOTE — Telephone Encounter (Signed)
Spoke with patient. Message from [place] given. Patient agreeable and verbalizes understanding. Patient wondering how much it would cost to switch over to Vivelle dot witht he insurance she has. Would like me to call insurance and see what co-pay would be for this medication as she has heard it is cheaper and is a smaller patch. Advised would call and call patient back as soon as possible.

## 2014-01-04 NOTE — Telephone Encounter (Signed)
OK to stop the paxil once all the hot flash symptoms are resolved.  Doesn't have to wean off, can just stop.

## 2014-01-04 NOTE — Telephone Encounter (Signed)
Spoke with patient. Advised called husbands insurance and could not get through to speak with anyone. Advised could try to get prescriptions filled at [facility] or [facility] as they may be more cost efficient for patient. Advised could call different pharmacies and see how much medication would be. Patient agreeable and verbalizes understanding.   Routing to provider for final review. Patient agreeable to disposition. Will close encounter

## 2014-01-04 NOTE — Telephone Encounter (Signed)
Patient is asking if you have received a response from [place].

## 2014-01-26 ENCOUNTER — Telehealth: Payer: Self-pay | Admitting: Obstetrics & Gynecology

## 2014-01-26 NOTE — Telephone Encounter (Signed)
Spoke with patient. Patient states that she has been experiencing increased night sweats for a couple of weeks. Patient also reports increased fatigue. Currently using Climara 0.0375mg  patch weekly and taking Prometrium 100mg  daily. Was taking Paxil but stopped around the week of 4/6 when she was no longer having symptoms. Patient would like to know what Dr.Miller recommends to help with increased night sweats. "I was doing just fine when I was taking Paxil and when I came off and now it has all just come back. I don't know if I need a different dose of medication or if I need to switch to a different patch." Patient states she was taking amitriptyline to help her sleep at night but stopped last Friday. "I am sleeping fine without taking it other than the night sweats." Advised would send a message to Dr.Miller for further instructions and give patient a call back. Patient agreeable.

## 2014-01-26 NOTE — Telephone Encounter (Signed)
Spoke with patient. Patient states that it is about time for her to refill her other prescription and will need new patches. Patient requesting that Dr.Miller send a couple weeks of new Rx for estradiol patch to [facility] and the rest to be [facility] due to cost. Patient states "I do not want to cause too much trouble. I just don't want there to be a time lapse between getting the new patches if I had them sent straight to [facility]." Advised would send message to [place] with pharmacy preferences so that prescription can be filled. Patient requesting I give her a call back once the order has been sent over. Advised I would give her a call as soon as they are placed. Patient agreeable.

## 2014-01-26 NOTE — Telephone Encounter (Signed)
Patient is having night sweats and sometimes during the day. Not sure what is going on.

## 2014-01-26 NOTE — Telephone Encounter (Signed)
OK to increase dosage to 0.05mg  patches.  Stay on the Prometrium 100mg  nightly.  I can do the rx's but does she [facility] or local pharmacy?  Thanks.

## 2014-01-27 MED ORDER — ESTRADIOL 0.05 MG/24HR TD PTWK: 0.0500 mg | MEDICATED_PATCH | TRANSDERMAL | Status: DC

## 2014-01-27 NOTE — Telephone Encounter (Signed)
Placed order for 1 month of 0.05 mg Climara to [facility]. [facility] supply [facility]. Continue with Provera qhs. Patient notified and agreeable of plan and thankful for her coordination of care.

## 2014-01-27 NOTE — Addendum Note (Signed)
Addended by: Michele Mcalpine on: 01/27/2014 09:03 AM   Modules accepted: Orders, Medications

## 2014-07-26 ENCOUNTER — Encounter: Payer: Self-pay | Admitting: Obstetrics & Gynecology

## 2014-10-25 ENCOUNTER — Encounter: Payer: Self-pay | Admitting: Obstetrics & Gynecology

## 2014-10-25 ENCOUNTER — Ambulatory Visit (INDEPENDENT_AMBULATORY_CARE_PROVIDER_SITE_OTHER): Payer: BLUE CROSS/BLUE SHIELD | Admitting: Obstetrics & Gynecology

## 2014-10-25 VITALS — BP 120/80 | HR 70 | Resp 14 | Ht 63.0 in | Wt 140.0 lb

## 2014-10-25 DIAGNOSIS — Z01419 Encounter for gynecological examination (general) (routine) without abnormal findings: Secondary | ICD-10-CM

## 2014-10-25 DIAGNOSIS — Z01812 Encounter for preprocedural laboratory examination: Secondary | ICD-10-CM

## 2014-10-25 DIAGNOSIS — Z0000 Encounter for general adult medical examination without abnormal findings: Secondary | ICD-10-CM

## 2014-10-25 DIAGNOSIS — Z124 Encounter for screening for malignant neoplasm of cervix: Secondary | ICD-10-CM

## 2014-10-25 DIAGNOSIS — R1032 Left lower quadrant pain: Secondary | ICD-10-CM

## 2014-10-25 LAB — POCT URINALYSIS DIPSTICK
PH UA: 5
UROBILINOGEN UA: NEGATIVE

## 2014-10-25 MED ORDER — PROGESTERONE MICRONIZED 100 MG PO CAPS: 100.0000 mg | ORAL_CAPSULE | Freq: Every day | ORAL | Status: DC

## 2014-10-25 MED ORDER — ESTRADIOL 0.05 MG/24HR TD PTTW: 1.0000 | MEDICATED_PATCH | TRANSDERMAL | Status: DC

## 2014-10-25 NOTE — Progress Notes (Signed)
59 y.o. G3P3 MarriedCaucasianF here for annual exam.  No vaginal bleeding.  Did blood work with Alyson Ingles 11/15.  Reports this was all normal.  Pt reports intermittent issues with a bulge in her LLQ.  States this is tender at times.  Feels like she can "push it back".  Questions whether this is a hernia.  Does not feel is present.   Patient's last menstrual period was 06/24/2008.          Sexually active: Yes.    The current method of family planning is post menopausal status.    Exercising: Yes.    walking  Smoker:  no  Health Maintenance: Pap:  10/12/13 Negative History of abnormal Pap:  no MMG:  10/22/14 neg Colonoscopy:  09/10/14 Normal f/u in 3 years because of tortuous colon and inadequate prep. BMD:   11/13, 0.8, -0.4 TDaP:  06/10/2006 Screening Labs: yes , Hb today: 12.3 , Urine today: WBC ++   reports that she has never smoked. She has never used smokeless tobacco. She reports that she does not drink alcohol or use illicit drugs.  Past Medical History  Diagnosis Date  . Complication of anesthesia   . PONV (postoperative nausea and vomiting)     "zofran works great  . Heart murmur   . Hypertension   . Vertigo   . GERD (gastroesophageal reflux disease)   . Shortness of breath     occ  . H/O hiatal hernia   . Cancer 08,11    melanoma-lft knee, lft elbow  . Acid reflux     GERD    Past Surgical History  Procedure Laterality Date  . Melanoma excision      knee 08 lft. elbow 11rt  . Shoulder surgery Right     pins  . Anterior lat lumbar fusion Right 04/17/2013    Procedure: ANTERIOR LATERAL LUMBAR FUSION 3 LEVELS;  Surgeon: Erline Levine, MD;  Location: [facility];  Service: Neurosurgery;  Laterality: Right;  Right Lumbar Two-Three Lumbar Three-Four Lumbar Four-Five  Anterolateral Fusion  . Lumbar percutaneous pedicle screw 3 level N/A 04/17/2013    Procedure: LUMBAR PERCUTANEOUS PEDICLE SCREW 3 LEVEL;  Surgeon: Erline Levine, MD;  Location: [facility];  Service:  Neurosurgery;  Laterality: N/A;  Decompression and Posterior Lumbar Interbody Fusion of Lumbar Five-Sacral One, Percutaneous Screws Lumbar Two through Sacral One  . Tubal ligation    . Cesarean section      x2  . Foot surgery      early 20s-bone spur removal  . Wle  06/17/07    and left groin lymph node  . Excision of melanoma      right shoulder    Family History  Problem Relation Age of Onset  . Diabetes Father     borderline  . Melanoma Father   . Colon cancer Mother   . Cancer Maternal Grandmother     "female"  . Hypertension Mother   . Congestive Heart Failure Father   . Heart disease Paternal Grandfather   . Osteoporosis Mother     ROS:  Pertinent items are noted in HPI.  Otherwise, a comprehensive ROS was negative.  Exam:   General appearance: alert, cooperative and appears stated age Head: Normocephalic, without obvious abnormality, atraumatic Neck: no adenopathy, supple, symmetrical, trachea midline and thyroid normal to inspection and palpation Lungs: clear to auscultation bilaterally Breasts: normal appearance, no masses or tenderness Heart: regular rate and rhythm Abdomen: soft, non-tender; bowel sounds normal; no masses,  no organomegaly, with pt doing a crunch, no mass was noted Extremities: extremities normal, atraumatic, no cyanosis or edema Skin: Skin color, texture, turgor normal. No rashes or lesions Lymph nodes: Cervical, supraclavicular, and axillary nodes normal. No abnormal inguinal nodes palpated Neurologic: Grossly normal   Pelvic: External genitalia:  no lesions              Urethra:  normal appearing urethra with no masses, tenderness or lesions              Bartholins and Skenes: normal                 Vagina: normal appearing vagina with normal color and discharge, no lesions              Cervix: no lesions              Pap taken: Yes.   Bimanual Exam:  Uterus:  normal size, contour, position, consistency, mobility, non-tender               Adnexa: normal adnexa and no mass, fullness, tenderness               Rectovaginal: Confirms               Anus:  normal sphincter tone, no lesions  Chaperone was present for exam.  A:  Well Woman with normal exam PMP, on HRT History of melanoma x 2 Family hx of colon cancer  Abdominal bulge that is not present on exam today.  Hx concerting for abdominal hernia.  When present, LLQ pain is present.  P: Mammogram scheduled. HR HPV testing 12/12.  Negative pap last year.  Pap obtained today. Labs with Dr. Alyson Ingles last fall. Release of records will be signed today. Change HRT to Vivelle dot 0.05 mg twice weekly.  #24/4RF Prometrium 100mg  daily.#90/4RF. Plan CT of abdomen/pelvis with contrast.  BMP will be done if needed. return annually or prn

## 2014-10-26 ENCOUNTER — Telehealth: Payer: Self-pay | Admitting: Emergency Medicine

## 2014-10-26 LAB — BASIC METABOLIC PANEL
BUN: 18 mg/dL (ref 6–23)
CALCIUM: 9.4 mg/dL (ref 8.4–10.5)
CO2: 28 mEq/L (ref 19–32)
Chloride: 100 mEq/L (ref 96–112)
Creat: 0.91 mg/dL (ref 0.50–1.10)
Glucose, Bld: 88 mg/dL (ref 70–99)
POTASSIUM: 3.8 meq/L (ref 3.5–5.3)
SODIUM: 140 meq/L (ref 135–145)

## 2014-10-26 LAB — HEMOGLOBIN, FINGERSTICK: HEMOGLOBIN, FINGERSTICK: 12.3 g/dL (ref 12.0–16.0)

## 2014-10-26 NOTE — Telephone Encounter (Signed)
Patient returned call and instructions given. Verbalized understanding.   Will call [facility] to discuss costs of CT.  Advised will call with results as they are available.  Routing to provider for final review. Patient agreeable to disposition. Will close encounter

## 2014-10-26 NOTE — Telephone Encounter (Signed)
Message left to return call to [place] at [phone].   Patient is scheduled for CT Abdomen/Pelvis with contrast for 11/02/14 at 1600 at [facility] at [place], [place] [address].   Patient given oral contrast and written instructions yesterday after office visit.   BMP drawn and resulted. Hx HBP.   Patient to drink one bottle of oral contrast at 2:00 and one bottle at 3:00 and liquids only from 12:00 until after appointment.

## 2014-10-28 LAB — IPS PAP TEST WITH REFLEX TO HPV

## 2014-11-02 ENCOUNTER — Ambulatory Visit
Admission: RE | Admit: 2014-11-02 | Discharge: 2014-11-02 | Disposition: A | Discharge status: Home / Self Care | Payer: BLUE CROSS/BLUE SHIELD | Source: Ambulatory Visit | Attending: Obstetrics & Gynecology | Admitting: Obstetrics & Gynecology

## 2014-11-02 ENCOUNTER — Telehealth: Payer: Self-pay | Admitting: Obstetrics & Gynecology

## 2014-11-02 DIAGNOSIS — R1032 Left lower quadrant pain: Secondary | ICD-10-CM

## 2014-11-02 MED ORDER — IOHEXOL 300 MG/ML  SOLN
100.0000 mL | Freq: Once | INTRAMUSCULAR | Status: AC | PRN
  Administered 2014-11-02: 100 mL via INTRAVENOUS

## 2014-11-02 NOTE — Telephone Encounter (Signed)
Called [facility] at [phone]: Spoke with Pharmacist, Octaviano Batty. He advised they were calling to ensure the change from Climara patch to Vivelle Dot. Advised this change is correct.  He states he will process Rx at this time.   Called patient mobile, okay to leave detailed message per designated party release form. Advised that [facility] was calling to confirm change from Climara to [facility] and that the information has been given to the pharmacist for processing. Advised to call back with any further concerns. Routing to provider for final review. Patient agreeable to disposition. Will close encounter

## 2014-11-02 NOTE — Telephone Encounter (Signed)
Patient calling stating [facility] contacted her and requested we contact them about the reference number below. Patient reports no other details were given to the patient from the pharmacy.  Reference number: [id number] [facility]  Patient is leaving to go out of town 11/09/14 and hopes to have this resolved by then.

## 2014-11-04 ENCOUNTER — Telehealth: Payer: Self-pay | Admitting: Obstetrics & Gynecology

## 2014-11-04 NOTE — Telephone Encounter (Signed)
Pt calling for results on recent ct scan. States she is going out of town on Sunday for an extended trip (daughter is having surgery out of state) and she would like to know before she leaves.  She is at work tomorrow from [employer] and can be reached at [phone] ok to leave message on this number to call back (no details) If call cell phone ok to leave detailed message  [phone]

## 2014-11-04 NOTE — Telephone Encounter (Signed)
Spoke with patient. Advised patient of message as seen below from [place]. Patient is agreeable and verbalizes understanding. Patient states that she sees the bulge nightly. "It is like when I take a shower and go to lay down I feel it and when I am up in the morning it seems like it has gone down." Patient also states that she was seen with GI in Dec 2015 for endoscope which showed hiatal hernia that she is now taking medication for and has follow up in six months. Advised patient will speak with Dr.Miller regarding bulge she is seeing and return call with further recommendations. Patient is agreeable.  Notes Recorded by Lyman Speller, MD on 11/03/2014 at 9:57 AM Please inform pt that her CT did not show an abdominal hernia so when she sees the bulge again, I would really like to see it.  The CT did show a moderate to large hiatal hernia. She probably should follow up with the GI just to discuss when treatment is appropriate. She also has a few tiny kidney stones. There was a lot of stool in the colon consistent with constipation. She should start on Colace 100mg  qd to bid.

## 2014-11-08 NOTE — Telephone Encounter (Signed)
There isn't another test I can do at this point.  CT is the best one for detecting a hernia.  I'll need to see it.

## 2014-11-09 NOTE — Telephone Encounter (Signed)
Spoke with patient. Advised of message as seen below from [place]. Patient is agreeable. Patient is currently out of town but will call when she is in town if she sees the area again to come be seen with Dr.Miller.  Routing to provider for final review. Patient agreeable to disposition. Will close encounter

## 2015-03-10 ENCOUNTER — Telehealth: Payer: Self-pay | Admitting: Obstetrics & Gynecology

## 2015-03-10 DIAGNOSIS — C4370 Malignant melanoma of unspecified lower limb, including hip: Secondary | ICD-10-CM

## 2015-03-10 HISTORY — DX: Malignant melanoma of unspecified lower limb, including hip: C43.70

## 2015-03-10 NOTE — Telephone Encounter (Signed)
She really tried to stop her HRT and was miserable.  I don't think she needs to do that right now.  That is just my opinion.    Nephrologist:  Edrick Oh.  He is at [facility] but he is so nice and a personal friend.  I don't think it is wrong to see a specialist.  It's about her kidneys and their functioning and this is important.  Thanks.

## 2015-03-10 NOTE — Telephone Encounter (Signed)
Call to patient. She has seen the PA Mora Appl at [facility]  oncology for her melanoma and this provider recommended patient to try to come off HRT and recommended Effexor to try. Patient would like to know if if this is something Dr. Sabra Heck would recommend she do. Patient states she has tried to wean off the patch prior and used Paxil/Brisdell but was unsuccessful due to hot flashes. Patient requests to Dr. Ammie Ferrier thoughts on trying to wean off again and suggestions.   Also, patient states she has been contacted from her PCP regarding kidney function and states that kidney function has declined. Patient unsure exactly what lab results were but wanted to know if Dr. Sabra Heck recommended that she see nephrology.  Advised patient that she should contact her PCP to discuss this and request referral to nephrology. Patient states her father had a bad experience at [facility] and wanted to know if Dr. Sabra Heck had any provider recommendations.

## 2015-03-10 NOTE — Telephone Encounter (Signed)
Patient has some questions regarding her use of estrogen patches and history of melanoma. Last seen 10/25/2014.

## 2015-03-11 NOTE — Telephone Encounter (Signed)
Patient returned call and message from Dr. Sabra Heck given. Patient agreeable to plan. She is offered consult with Dr. Sabra Heck to discuss HRT patient declines at this time, she will think about things and return call to schedule. She also has call in to PCP to request referral to Nephrology. Routing to provider for final review. Patient agreeable to disposition. Will close encounter.

## 2015-04-28 ENCOUNTER — Encounter (HOSPITAL_BASED_OUTPATIENT_CLINIC_OR_DEPARTMENT_OTHER): Payer: Self-pay

## 2015-04-28 ENCOUNTER — Emergency Department (HOSPITAL_BASED_OUTPATIENT_CLINIC_OR_DEPARTMENT_OTHER): Payer: Worker's Compensation

## 2015-04-28 ENCOUNTER — Emergency Department (HOSPITAL_BASED_OUTPATIENT_CLINIC_OR_DEPARTMENT_OTHER)
Admission: EM | Admit: 2015-04-28 | Discharge: 2015-04-28 | Disposition: A | Discharge status: Home / Self Care | Payer: Worker's Compensation | Attending: Emergency Medicine | Admitting: Emergency Medicine

## 2015-04-28 DIAGNOSIS — Z9889 Other specified postprocedural states: Secondary | ICD-10-CM | POA: Diagnosis not present

## 2015-04-28 DIAGNOSIS — W1839XA Other fall on same level, initial encounter: Secondary | ICD-10-CM | POA: Insufficient documentation

## 2015-04-28 DIAGNOSIS — Y998 Other external cause status: Secondary | ICD-10-CM | POA: Insufficient documentation

## 2015-04-28 DIAGNOSIS — S43014A Anterior dislocation of right humerus, initial encounter: Secondary | ICD-10-CM | POA: Insufficient documentation

## 2015-04-28 DIAGNOSIS — Z85820 Personal history of malignant melanoma of skin: Secondary | ICD-10-CM | POA: Diagnosis not present

## 2015-04-28 DIAGNOSIS — Y9301 Activity, walking, marching and hiking: Secondary | ICD-10-CM | POA: Insufficient documentation

## 2015-04-28 DIAGNOSIS — S43004A Unspecified dislocation of right shoulder joint, initial encounter: Secondary | ICD-10-CM

## 2015-04-28 DIAGNOSIS — Z7982 Long term (current) use of aspirin: Secondary | ICD-10-CM | POA: Diagnosis not present

## 2015-04-28 DIAGNOSIS — T1490XA Injury, unspecified, initial encounter: Secondary | ICD-10-CM

## 2015-04-28 DIAGNOSIS — K219 Gastro-esophageal reflux disease without esophagitis: Secondary | ICD-10-CM | POA: Diagnosis not present

## 2015-04-28 DIAGNOSIS — R011 Cardiac murmur, unspecified: Secondary | ICD-10-CM | POA: Diagnosis not present

## 2015-04-28 DIAGNOSIS — Z79899 Other long term (current) drug therapy: Secondary | ICD-10-CM | POA: Insufficient documentation

## 2015-04-28 DIAGNOSIS — S4991XA Unspecified injury of right shoulder and upper arm, initial encounter: Secondary | ICD-10-CM | POA: Diagnosis present

## 2015-04-28 DIAGNOSIS — I10 Essential (primary) hypertension: Secondary | ICD-10-CM | POA: Insufficient documentation

## 2015-04-28 DIAGNOSIS — Y9289 Other specified places as the place of occurrence of the external cause: Secondary | ICD-10-CM | POA: Diagnosis not present

## 2015-04-28 HISTORY — PX: SHOULDER SURGERY: SHX246

## 2015-04-28 MED ORDER — ONDANSETRON 4 MG PO TBDP
4.0000 mg | ORAL_TABLET | Freq: Once | ORAL | Status: AC
  Administered 2015-04-28: 4 mg via ORAL
  Filled 2015-04-28: qty 1

## 2015-04-28 MED ORDER — ONDANSETRON 4 MG PO TBDP: ORAL_TABLET | ORAL | Status: DC

## 2015-04-28 MED ORDER — OXYCODONE-ACETAMINOPHEN 5-325 MG PO TABS: 1.0000 | ORAL_TABLET | Freq: Four times a day (QID) | ORAL | Status: DC | PRN

## 2015-04-28 MED ORDER — OXYCODONE-ACETAMINOPHEN 5-325 MG PO TABS
2.0000 | ORAL_TABLET | Freq: Once | ORAL | Status: AC
Start: 2015-04-28 — End: 2015-04-28
  Administered 2015-04-28: 2 via ORAL
  Filled 2015-04-28: qty 2
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
## 2015-04-28 NOTE — Discharge Instructions (Signed)
Take Zofran as directed as needed for nausea. Take Vicodin for severe pain only. No driving or operating heavy machinery while taking vicodin. This medication may cause drowsiness. Use the sling at all times.  Shoulder Dislocation Your shoulder is made up of three bones: the collar bone (clavicle); the shoulder blade (scapula), which includes the socket (glenoid cavity); and the upper arm bone (humerus). Your shoulder joint is the place where these bones meet. Strong, fibrous tissues hold these bones together (ligaments). Muscles and strong, fibrous tissues that connect the muscles to these bones (tendons) allow your arm to move through this joint. The range of motion of your shoulder joint is more extensive than most of your other joints, and the glenoid cavity is very shallow. That is the reason that your shoulder joint is one of the most unstable joints in your body. It is far more prone to dislocation than your other joints. Shoulder dislocation is when your humerus is forced out of your shoulder joint. CAUSES Shoulder dislocation is caused by a forceful impact on your shoulder. This impact usually is from an injury, such as a sports injury or a fall. SYMPTOMS Symptoms of shoulder dislocation include:  Deformity of your shoulder.  Intense pain.  Inability to move your shoulder joint.  Numbness, weakness, or tingling around your shoulder joint (your neck or down your arm).  Bruising or swelling around your shoulder. DIAGNOSIS In order to diagnose a dislocated shoulder, your caregiver will perform a physical exam. Your caregiver also may have an X-ray exam done to see if you have any broken bones. Magnetic resonance imaging (MRI) is a procedure that sometimes is done to help your caregiver see any damage to the soft tissues around your shoulder, particularly your rotator cuff tendons. Additionally, your caregiver also may have electromyography done to measure the electrical discharges produced  in your muscles if you have signs or symptoms of nerve damage. TREATMENT A shoulder dislocation is treated by placing the humerus back in the joint (reduction). Your caregiver does this either manually (closed reduction), by moving your humerus back into the joint through manipulation, or through surgery (open reduction). When your humerus is back in place, severe pain should improve almost immediately. You also may need to have surgery if you have a weak shoulder joint or ligaments, and you have recurring shoulder dislocations, despite rehabilitation. In rare cases, surgery is necessary if your nerves or blood vessels are damaged during the dislocation. After your reduction, your arm will be placed in a shoulder immobilizer or sling to keep it from moving. Your caregiver will have you wear your shoulder immobilizer or sling for 3 days to 3 weeks, depending on how serious your dislocation is. When your shoulder immobilizer or sling is removed, your caregiver may prescribe physical therapy to help improve the range of motion in your shoulder joint. HOME CARE INSTRUCTIONS  The following measures can help to reduce pain and speed up the healing process:  Rest your injured joint. Do not move it. Avoid activities similar to the one that caused your injury.  Apply ice to your injured joint for the first day or two after your reduction or as directed by your caregiver. Applying ice helps to reduce inflammation and pain.  Put ice in a plastic bag.  Place a towel between your skin and the bag.  Leave the ice on for 15-20 minutes at a time, every 2 hours while you are awake.  Exercise your hand by squeezing a soft ball.  This helps to eliminate stiffness and swelling in your hand and wrist.  Take over-the-counter or prescription medicine for pain or discomfort as told by your caregiver. SEEK IMMEDIATE MEDICAL CARE IF:   Your shoulder immobilizer or sling becomes damaged.  Your pain becomes worse rather  than better.  You lose feeling in your arm or hand, or they become white and cold. MAKE SURE YOU:   Understand these instructions.  Will watch your condition.  Will get help right away if you are not doing well or get worse. Document Released: 06/05/2001 Document Revised: 01/25/2014 Document Reviewed: 07/01/2011 [facility] Patient Information 2015 [place], [place]. This information is not intended to replace advice given to you by your health care provider. Make sure you discuss any questions you have with your health care provider.
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
## 2015-08-12 ENCOUNTER — Other Ambulatory Visit: Payer: Self-pay | Admitting: Neurosurgery

## 2015-08-12 DIAGNOSIS — M545 Low back pain: Secondary | ICD-10-CM

## 2015-08-25 ENCOUNTER — Ambulatory Visit
Admission: RE | Admit: 2015-08-25 | Discharge: 2015-08-25 | Disposition: A | Discharge status: Home / Self Care | Payer: BLUE CROSS/BLUE SHIELD | Source: Ambulatory Visit | Attending: Neurosurgery | Admitting: Neurosurgery

## 2015-08-25 DIAGNOSIS — M545 Low back pain: Secondary | ICD-10-CM

## 2015-11-16 ENCOUNTER — Telehealth: Payer: Self-pay | Admitting: Obstetrics & Gynecology

## 2015-11-16 NOTE — Telephone Encounter (Signed)
Spoke with patient. Advised patient rx for Vivelle Dot 0.05 mg patches and Prometrium 100 mg capsules have been called in to [facility]. She is agreeable and verbalizes understanding.  Routing to provider for final review. Patient agreeable to disposition. Will close encounter.

## 2015-11-16 NOTE — Telephone Encounter (Signed)
Left message to call [place] at [phone].  Need to advised patient I have contacted [facility] as Dr.Miller sent in rx for Vivelle Dot and Prometrium to [facility] when she was seen for her aex on 10/26/2015. I have called [facility] and spoke with Hoyle Sauer who states that the prescriptions are not on file for the new year. Call was transferred to [facility]. I have called in Vivelle Dot 0.05mg  patch place 1 patch onto the skin twice per week #24 4RF and Prometrium 100 mg take 1 capsule by mouth daily #90 4RF.

## 2015-11-16 NOTE — Telephone Encounter (Signed)
Patient says she need a new refill on her progesterone and estradiol because of insurance change. [facility] id# [id number] at [phone].

## 2015-11-16 NOTE — Telephone Encounter (Signed)
Patient is returning a call to Kaitlyn. °

## 2015-12-30 ENCOUNTER — Encounter: Payer: Self-pay | Admitting: Obstetrics & Gynecology

## 2015-12-30 ENCOUNTER — Ambulatory Visit (INDEPENDENT_AMBULATORY_CARE_PROVIDER_SITE_OTHER): Payer: BLUE CROSS/BLUE SHIELD | Admitting: Obstetrics & Gynecology

## 2015-12-30 VITALS — BP 120/76 | HR 82 | Resp 18 | Ht 62.75 in | Wt 143.0 lb

## 2015-12-30 DIAGNOSIS — N858 Other specified noninflammatory disorders of uterus: Secondary | ICD-10-CM | POA: Diagnosis not present

## 2015-12-30 DIAGNOSIS — Z01419 Encounter for gynecological examination (general) (routine) without abnormal findings: Secondary | ICD-10-CM | POA: Diagnosis not present

## 2015-12-30 DIAGNOSIS — K449 Diaphragmatic hernia without obstruction or gangrene: Secondary | ICD-10-CM | POA: Diagnosis not present

## 2015-12-30 DIAGNOSIS — Z0000 Encounter for general adult medical examination without abnormal findings: Secondary | ICD-10-CM | POA: Diagnosis not present

## 2015-12-30 DIAGNOSIS — Z124 Encounter for screening for malignant neoplasm of cervix: Secondary | ICD-10-CM | POA: Diagnosis not present

## 2015-12-30 DIAGNOSIS — N950 Postmenopausal bleeding: Secondary | ICD-10-CM

## 2015-12-30 DIAGNOSIS — Z1151 Encounter for screening for human papillomavirus (HPV): Secondary | ICD-10-CM | POA: Diagnosis not present

## 2015-12-30 LAB — POCT URINALYSIS DIPSTICK
Bilirubin, UA: NEGATIVE
Blood, UA: NEGATIVE
Glucose, UA: NEGATIVE
Ketones, UA: NEGATIVE
Nitrite, UA: NEGATIVE
PROTEIN UA: NEGATIVE
Urobilinogen, UA: NEGATIVE
pH, UA: 7

## 2015-12-30 MED ORDER — PROGESTERONE MICRONIZED 100 MG PO CAPS: 100.0000 mg | ORAL_CAPSULE | Freq: Every day | ORAL | Status: DC

## 2015-12-30 MED ORDER — ESTRADIOL 0.05 MG/24HR TD PTTW: 1.0000 | MEDICATED_PATCH | TRANSDERMAL | Status: DC

## 2015-12-30 NOTE — Progress Notes (Signed)
60 y.o. G3P3 MarriedCaucasianF here for annual exam.  Reports she had an episode of vaginal bleeding in March where she feel she was having PMS and she had some pelvic cramping.  Then she had some dark bleeding for three or four days.  This was primarily with wiping.    PCP:  Dr. Alyson Ingles.  Pt reports she had an elevated creatinine.  Follow up was higher.  Pt then had an additional test which was normal.  Has appt in May.  Will plan tetanus shot then.    Patient's last menstrual period was 06/24/2008.          Sexually active: Yes.    The current method of family planning is post menopausal status.    Exercising: Yes.    Treadmill 1 mile most days  Smoker:  no  Health Maintenance: Pap:  10/25/14 Neg History of abnormal Pap:  no MMG:  10/24/15 BIRADS1:neg Colonoscopy:  09/10/14  BMD:   07/2012, normal TDaP:  05/2006 Shingles vaccine:  completed Screening Labs: PCP, Urine today: WBC=Trace   reports that she has never smoked. She has never used smokeless tobacco. She reports that she does not drink alcohol or use illicit drugs.  Past Medical History  Diagnosis Date  . Complication of anesthesia   . PONV (postoperative nausea and vomiting)     "zofran works great  . Heart murmur   . Hypertension   . Vertigo   . GERD (gastroesophageal reflux disease)   . Shortness of breath     occ  . H/O hiatal hernia   . Cancer ([place]) 08,11    melanoma-lft knee, lft elbow  . Acid reflux     GERD    Past Surgical History  Procedure Laterality Date  . Melanoma excision      knee 08 lft. elbow 11rt  . Shoulder surgery Right     pins  . Anterior lat lumbar fusion Right 04/17/2013    Procedure: ANTERIOR LATERAL LUMBAR FUSION 3 LEVELS;  Surgeon: Erline Levine, MD;  Location: [place] [facility];  Service: Neurosurgery;  Laterality: Right;  Right Lumbar Two-Three Lumbar Three-Four Lumbar Four-Five  Anterolateral Fusion  . Lumbar percutaneous pedicle screw 3 level N/A 04/17/2013    Procedure: LUMBAR PERCUTANEOUS  PEDICLE SCREW 3 LEVEL;  Surgeon: Erline Levine, MD;  Location: [place] [facility];  Service: Neurosurgery;  Laterality: N/A;  Decompression and Posterior Lumbar Interbody Fusion of Lumbar Five-Sacral One, Percutaneous Screws Lumbar Two through Sacral One  . Tubal ligation    . Cesarean section      x2  . Foot surgery      early 20s-bone spur removal  . Wle  06/17/07    and left groin lymph node  . Excision of melanoma      right shoulder  . Back surgery    . Shoulder surgery Right 04/28/2015    Current Outpatient Prescriptions  Medication Sig Dispense Refill  . acetaminophen (TYLENOL) 500 MG tablet Take 500 mg by mouth every 6 (six) hours as needed for pain.    Marland Kitchen amitriptyline (ELAVIL) 50 MG tablet Take 1 tablet by mouth at bedtime.    . Ascorbic Acid (VITAMIN C WITH ROSE HIPS) 1000 MG tablet Take 1,000 mg by mouth daily.    Marland Kitchen aspirin 81 MG tablet Take 81 mg by mouth daily.    . calcium carbonate (TUMS EX) 750 MG chewable tablet Chew 2 tablets by mouth at bedtime.    . Calcium-Magnesium-Vitamin D (CALCIUM MAGNESIUM PO)  Take 1 tablet by mouth daily.    . cetirizine (ZYRTEC) 10 MG tablet Take 10 mg by mouth daily.    . Cholecalciferol (VITAMIN D3) 5000 UNITS CAPS Take 1 capsule by mouth daily.    Marland Kitchen estradiol (VIVELLE-DOT) 0.05 MG/24HR patch Place 1 patch (0.05 mg total) onto the skin 2 (two) times a week. 24 patch 4  . Evening Primrose Oil 500 MG CAPS Take by mouth.    . famotidine (PEPCID) 10 MG tablet Take 10 mg by mouth 2 (two) times daily as needed for heartburn.    Marland Kitchen ibuprofen (ADVIL,MOTRIN) 200 MG tablet Take 200 mg by mouth every 6 (six) hours as needed.    . Lactobacillus Rhamnosus, GG, (CULTURELLE PO) Take by mouth daily.     Marland Kitchen losartan-hydrochlorothiazide (HYZAAR) 100-12.5 MG per tablet Take 1 tablet by mouth daily.    . meclizine (ANTIVERT) 25 MG tablet Take 25 mg by mouth 3 (three) times daily as needed for dizziness.    . Multiple Vitamins-Minerals (ALIVE WOMENS 50+ PO) Take 1 tablet  by mouth daily.    . NON FORMULARY Immune complete    . ondansetron (ZOFRAN ODT) 4 MG disintegrating tablet 4mg  ODT q4 hours prn nausea/vomit 10 tablet 0  . pantoprazole (PROTONIX) 40 MG tablet Take 40 mg by mouth daily.    . predniSONE (DELTASONE) 5 MG tablet Take 1 tablet by mouth daily.  0  . progesterone (PROMETRIUM) 100 MG capsule Take 1 capsule (100 mg total) by mouth daily. 90 capsule 4  . TURMERIC CURCUMIN PO Take by mouth daily.    . vitamin B-12 (CYANOCOBALAMIN) 1000 MCG tablet Take 1,000 mcg by mouth daily.    . vitamin E (VITAMIN E) 400 UNIT capsule Take 400 Units by mouth daily.    . diazepam (VALIUM) 2 MG tablet Take 1 tablet by mouth daily as needed. Reported on 12/30/2015  0  . meloxicam (MOBIC) 15 MG tablet Take 15 mg by mouth daily as needed. Reported on 12/30/2015     No current facility-administered medications for this visit.    Family History  Problem Relation Age of Onset  . Diabetes Father     borderline  . Melanoma Father   . Colon cancer Mother   . Cancer Maternal Grandmother     "female"  . Hypertension Mother   . Congestive Heart Failure Father   . Heart disease Paternal Grandfather   . Osteoporosis Mother     ROS:  Pertinent items are noted in HPI.  Otherwise, a comprehensive ROS was negative.  Exam:   BP 120/76 mmHg  Pulse 82  Resp 18  Ht 5' 2.75" (1.594 m)  Wt 143 lb (64.864 kg)  BMI 25.53 kg/m2  LMP 06/24/2008  Weight change: +3#   Height: 5' 2.75" (159.4 cm)  Ht Readings from Last 3 Encounters:  12/30/15 5' 2.75" (1.594 m)  04/28/15 5\' 4"  (1.626 m)  10/25/14 5\' 3"  (1.6 m)    General appearance: alert, cooperative and appears stated age Head: Normocephalic, without obvious abnormality, atraumatic Neck: no adenopathy, supple, symmetrical, trachea midline and thyroid normal to inspection and palpation Lungs: clear to auscultation bilaterally Breasts: normal appearance, no masses or tenderness Heart: regular rate and rhythm Abdomen: soft,  non-tender; bowel sounds normal; no masses,  no organomegaly Extremities: extremities normal, atraumatic, no cyanosis or edema Skin: Skin color, texture, turgor normal. No rashes or lesions Lymph nodes: Cervical, supraclavicular, and axillary nodes normal. No abnormal inguinal nodes palpated Neurologic: Grossly normal  Pelvic: External genitalia:  no lesions              Urethra:  normal appearing urethra with no masses, tenderness or lesions              Bartholins and Skenes: normal                 Vagina: normal appearing vagina with normal color and discharge, no lesions              Cervix: no lesions              Pap taken: Yes.   Bimanual Exam:  Uterus:  normal size, contour, position, consistency, mobility, non-tender              Adnexa: normal adnexa and no mass, fullness, tenderness               Rectovaginal: Confirms               Anus:  normal sphincter tone, no lesions  Procedure:  Speculum placed.  Cervix visualized and cleansed with betadine prep.  A single toothed tenaculum was applied to the anterior lip of the cervix.  Endometrial pipelle was advanced through the cervix into the endometrial cavity without difficulty.  Pipelle passed to 7cm.  Suction applied and pipelle removed with scant tissue sample obtained.  Two passes performed for better tissue specimen.  Tenculum removed.  No bleeding noted.  Patient tolerated procedure well.  Chaperone was present for exam.  A:  Well Woman with normal exam PMP, on HRT Episode of PMP bleeding in March History of melanoma x 2 Family hx of colon cancer  Sliding hiatal hernia.  Sees Dr. Watt Climes.  Has follow up 2018.  P: Mammogram scheduled. HR HPV testing 12/12. Pap and HR HPV testing obtained today. Endometrial biopsy pending from today Change HRT to Vivelle dot 0.05 mg twice weekly and Prometrium 100mg  daily.No RFs needed today.  Pt will start to cut 1/3 of her patch off now and if wants 0.0375mg  dosing with RF,  will let me know.  She and I discussed risks including DVT/PE/MI/stroke/breast cancer.  Pt tried to wean off completely and she was very symptomatic.  Desires to not do this again.   Plan Hep C testing with Dr. Alyson Ingles in May Will have tdap with him as well in May  return annually or prn

## 2015-12-30 NOTE — Patient Instructions (Signed)
Remember to do a tetanus update with Dr. Alyson Ingles in May.

## 2016-01-02 ENCOUNTER — Telehealth: Payer: Self-pay | Admitting: Obstetrics & Gynecology

## 2016-01-02 DIAGNOSIS — N950 Postmenopausal bleeding: Secondary | ICD-10-CM

## 2016-01-02 NOTE — Telephone Encounter (Signed)
Patient states she had a biopsy done on Friday and had a little bleeding afterwards. She was fine on Saturday and now is feeling like a cycle is starting. Patient wants to know if this is normal.

## 2016-01-02 NOTE — Telephone Encounter (Signed)
Spoke with patient. Advised of message as seen below from [place]. She would like to move forward with scheduling an ultrasound at this time. PUS scheduled for 01/05/2016 at 2 pm with 2:30 pm consult with Dr.Miller. She is agreeable to date and time. Order placed for precert.  Cc: Theresia Lo  Routing to provider for final review. Patient agreeable to disposition. Will close encounter.

## 2016-01-02 NOTE — Telephone Encounter (Signed)
I think we should plan an ultrasound to see what else is causing the bleeding.  We can wait until the biopsy comes back or go ahead and schedule the PUS.  Thanks.

## 2016-01-02 NOTE — Telephone Encounter (Signed)
Spoke with patient. Patient had an EMB on 12/30/2015 with Dr.Miller. States that on Friday 12/30/2015 she had light spotting following the biopsy. On Saturday 12/31/2015 she had no bleeding. Sunday 01/01/2016 spotting began again. Today spotting has increased. Reports the bleeding is a little more than spotting, but not the flow of a normal cycle. Blood is bright red in color. "This is around the same time last month that I had bleeding which is why I had the biopsy." Denies any pelvic pain, vaginal discharge, odor, fever, or chills. EMB results are still pending. Advised I will speak with Dr.Miller and return call with any additional recommendations. She is agreeable.

## 2016-01-03 ENCOUNTER — Telehealth: Payer: Self-pay | Admitting: Obstetrics & Gynecology

## 2016-01-03 LAB — IPS PAP TEST WITH HPV

## 2016-01-03 NOTE — Telephone Encounter (Signed)
Spoke with pt regarding benefit for ultrasound. Patient understood and agreeable. Patient ready to schedule. Patient scheduled 01/05/16 with Dr. Sabra Heck. Pt aware of arrival date and time. Pt aware of 72 hours cancellation policy with [id number] fee. No further questions. Ok to close

## 2016-01-05 ENCOUNTER — Ambulatory Visit (INDEPENDENT_AMBULATORY_CARE_PROVIDER_SITE_OTHER): Payer: BLUE CROSS/BLUE SHIELD | Admitting: Obstetrics & Gynecology

## 2016-01-05 ENCOUNTER — Ambulatory Visit (INDEPENDENT_AMBULATORY_CARE_PROVIDER_SITE_OTHER): Payer: BLUE CROSS/BLUE SHIELD

## 2016-01-05 VITALS — BP 120/76 | HR 82 | Resp 16 | Ht 62.75 in | Wt 145.0 lb

## 2016-01-05 DIAGNOSIS — Z79890 Hormone replacement therapy (postmenopausal): Secondary | ICD-10-CM | POA: Diagnosis not present

## 2016-01-05 DIAGNOSIS — N950 Postmenopausal bleeding: Secondary | ICD-10-CM | POA: Diagnosis not present

## 2016-01-05 DIAGNOSIS — D62 Acute posthemorrhagic anemia: Secondary | ICD-10-CM | POA: Diagnosis not present

## 2016-01-05 LAB — CBC
HEMATOCRIT: 35.7 % (ref 35.0–45.0)
HEMOGLOBIN: 11.9 g/dL (ref 11.7–15.5)
MCH: 30.8 pg (ref 27.0–33.0)
MCHC: 33.3 g/dL (ref 32.0–36.0)
MCV: 92.5 fL (ref 80.0–100.0)
MPV: 8.9 fL (ref 7.5–12.5)
Platelets: 346 10*3/uL (ref 140–400)
RBC: 3.86 MIL/uL (ref 3.80–5.10)
RDW: 13.7 % (ref 11.0–15.0)
WBC: 7.2 10*3/uL (ref 3.8–10.8)

## 2016-01-05 LAB — IRON: IRON: 99 ug/dL (ref 45–160)

## 2016-01-05 LAB — FERRITIN: Ferritin: 34 ng/mL (ref 20–288)

## 2016-01-05 MED ORDER — NORETHINDRONE ACETATE 5 MG PO TABS
5.0000 mg | ORAL_TABLET | Freq: Two times a day (BID) | ORAL | Status: DC
Start: 2016-01-05 — End: 2016-01-24

## 2016-01-05 MED ORDER — NORETHINDRONE ACETATE 5 MG PO TABS: 5.0000 mg | ORAL_TABLET | Freq: Two times a day (BID) | ORAL | Status: DC

## 2016-01-05 NOTE — Progress Notes (Signed)
60 y.o. G3P3 Married Caucasian female here for pelvic ultrasound due to PMP bleeding.  Patient was initially seen for this complaint on 12/30/15 and an endometrial biopsy was performed. This pathology is negative. Patient has continued to bleed. She reports the bleeding is sectioned and heavier the last 2 days. Patient is doing a little bit of lightheadedness today. She is not sure if that's due to the heat, not enough fluids, or bleeding.  Patient's last menstrual period was 06/24/2008.  Contraception: Postmenopausal status  Findings:  UTERUS: 9.3 x 5.2 x 3.7 cm with 2 small fibroids measuring 1.8 cm each EMS: 2.6 mm with obvious blood and clot within the endometrial cavity ADNEXA: Left ovary: 2.3 x 1.1 x 1.1 cm       Right ovary: 2.1 x 1.0 x 1.1 cm. Both ovaries appear atrophic CUL DE SAC: No free fluid  Discussion: Imaging reviewed with patient. Discussed with patient starting norethindrone to try and get her bleeding to stop. Patient we traveling this weekend and through the next week. Will assess for anemia and iron deficiency today as well. I'm not can have her change her hormone replacement therapy at this time. We may need to proceed with hysteroscopy and D&C if the progesterone does not control the bleeding. Patient is amenable to this and in fact would like to proceed whether or not the bleeding stops as she is quite anxious about this postmenopausal bleeding.    Procedure discussed with patient.  Recovery and pain management discussed.  Risks discussed including but not limited to bleeding, rare risk of transfusion, infection, 1% risk of uterine perforation with risks of fluid deficit causing cardiac arrythmia, cerebral swelling and/or need to stop procedure early.  Fluid emboli and rare risk of death discussed.  DVT/PE, rare risk of risk of bowel/bladder/ureteral/vascular injury.  Patient aware if pathology abnormal she may need additional treatment.  All questions answered.  Will check with  her over the weekend and give her lab values. We will reassess in the early part of next week to determine whether hysteroscopy is indeed appropriate.   Assessment:  Postmenopausal bleeding Mild anemia with fingerstick hemoglobin of 11.6 2 cm uterine fibroids 2  Plan:  Norethindrone 5 mg twice a day until her bleeding stops. Then she'll decrease to 5 mg daily. Hopefully this will control her bleeding. CBC with iron studies obtained today. Results were called to the patient. We may need to proceed with hysteroscopy if I cannot control the bleeding with her norethindrone. Patient will continue on her current HRT as well.  ~25 minutes spent with patient >50% of time was in face to face discussion of above.

## 2016-01-06 ENCOUNTER — Encounter: Payer: Self-pay | Admitting: Obstetrics & Gynecology

## 2016-01-09 ENCOUNTER — Telehealth: Payer: Self-pay | Admitting: Obstetrics & Gynecology

## 2016-01-09 NOTE — Telephone Encounter (Signed)
Patient wanted to let Dr.Miller know that she is still bleeding even though she didn't take any of the medication on yesterday because she thought she had food poisoning. She would also like to get her iron level results.

## 2016-01-09 NOTE — Telephone Encounter (Signed)
Spoke with patient. Patient was seen in the office on 01/05/2016 for PUS with Dr.Miller for evaluation of PMB. She was started on Aygestin 5 mg to take 2 tablets daily until bleeding stopped and then reduce to 1 tablet daily. States she took two tablets on 01/13/2016 and 01/14/2016. She did not take any medication yesterday as she was sick due to "food poisoning." Reports she woke up this morning and is having light brown/red bleeding again. Took Aygestin this morning. Reports she is to schedule a D&C on 01/23/2016 with Dr.Miller. States she is willing to move this up if needed. Asking about lab results from 01/05/2016. Advised CBC, Iron, and Ferritin are all normal. She is agreeable. Advised I will speak with Dr.Miller and return call with further recommendations. She is agreeable.

## 2016-01-10 NOTE — Telephone Encounter (Signed)
Routing to [place] for review and advise. I have also notified Lamont Snowball, RN about patient wanting to schedule D&C at this time.

## 2016-01-10 NOTE — Telephone Encounter (Signed)
Call to patient. Advised of instructions from Dr Sabra Heck regarding Aygestin. Once bleeding stops with BID dosing, decreased to daily dosing and continue till surgery.  Surgery instruction sheet reviewed and printed copy mailed to patient. Will plan for surgery on Tuesday 01-24-16 at [facility]. Patient is agreeable to May 2 instead of May 1.  Patient to call for any changes in bleeding prior to procedure.  Encounter closed.

## 2016-01-10 NOTE — Telephone Encounter (Signed)
Just keep aygestin and she can take twice daily until bleeding stops again.  Try not to miss any and then decrease to daily.  Planing hysteroscopy and D&C on May 1st, Monday.  Gay Filler is aware.

## 2016-01-10 NOTE — Telephone Encounter (Signed)
Patient calling to check if the nurse has talked to Dr. Sabra Heck regarding her message from yesterday.

## 2016-01-11 ENCOUNTER — Other Ambulatory Visit: Payer: Self-pay | Admitting: Obstetrics & Gynecology

## 2016-01-17 ENCOUNTER — Telehealth: Payer: Self-pay | Admitting: Obstetrics & Gynecology

## 2016-01-17 NOTE — Telephone Encounter (Signed)
Return call to patient. Left message to call back. 

## 2016-01-17 NOTE — Telephone Encounter (Signed)
Spoke with patient regarding surgery benefits. Pt agreeable.  Patient states she is taking a probiotic daily and wants to verify if she can continue taking prior to her scheduled procedure. Routing to [place] for review.

## 2016-01-17 NOTE — Telephone Encounter (Signed)
Patient returned call. States she takes Culturelle daily for digestive health and regularity. Advised she can continue this but would omit it day before and day of procedure. Patient agreeable.  Routing to provider for final review. Patient agreeable to disposition. Will close encounter.    (routing to Dr Quincy Simmonds while Dr Sabra Heck out of office.)

## 2016-01-19 NOTE — Patient Instructions (Addendum)
Your procedure is scheduled on:  Tuesday, May 2  Enter through the [employer] [facility] at: [employer] up the phone at the desk and dial [phone].  Call this number if you have problems the morning of surgery: [phone].  Remember: Do NOT eat or drink clear liquids after:  Midnight Monday, 5/1 Take these medicines the morning of surgery with a SIP OF WATER:  Protonix, Losartan/HCTZ, Zyrtec and valium if needed  Do NOT wear jewelry (body piercing), metal hair clips/bobby pins, make-up, or nail polish. Do NOT wear lotions, powders, or perfumes.  You may wear deoderant. Do NOT shave for 48 hours prior to surgery. Do NOT bring valuables to the hospital.  Have a responsible adult drive you home and stay with you for 24 hours after your procedure.  Home with husband Levada Dy cell [phone].

## 2016-01-20 ENCOUNTER — Encounter (HOSPITAL_COMMUNITY)
Admission: RE | Admit: 2016-01-20 | Discharge: 2016-01-20 | Disposition: A | Discharge status: Home / Self Care | Payer: BLUE CROSS/BLUE SHIELD | Source: Ambulatory Visit | Attending: Obstetrics & Gynecology | Admitting: Obstetrics & Gynecology

## 2016-01-20 ENCOUNTER — Telehealth: Payer: Self-pay | Admitting: *Deleted

## 2016-01-20 ENCOUNTER — Other Ambulatory Visit: Payer: Self-pay

## 2016-01-20 ENCOUNTER — Encounter (HOSPITAL_COMMUNITY): Payer: Self-pay

## 2016-01-20 DIAGNOSIS — Z01810 Encounter for preprocedural cardiovascular examination: Secondary | ICD-10-CM | POA: Insufficient documentation

## 2016-01-20 DIAGNOSIS — I10 Essential (primary) hypertension: Secondary | ICD-10-CM | POA: Diagnosis not present

## 2016-01-20 DIAGNOSIS — N950 Postmenopausal bleeding: Secondary | ICD-10-CM | POA: Diagnosis not present

## 2016-01-20 DIAGNOSIS — K219 Gastro-esophageal reflux disease without esophagitis: Secondary | ICD-10-CM | POA: Diagnosis not present

## 2016-01-20 DIAGNOSIS — Z01812 Encounter for preprocedural laboratory examination: Secondary | ICD-10-CM | POA: Insufficient documentation

## 2016-01-20 HISTORY — DX: Other seasonal allergic rhinitis: J30.2

## 2016-01-20 LAB — CBC
HCT: 32.2 % — ABNORMAL LOW (ref 36.0–46.0)
Hemoglobin: 11 g/dL — ABNORMAL LOW (ref 12.0–15.0)
MCH: 31.1 pg (ref 26.0–34.0)
MCHC: 34.2 g/dL (ref 30.0–36.0)
MCV: 91 fL (ref 78.0–100.0)
PLATELETS: 438 10*3/uL — AB (ref 150–400)
RBC: 3.54 MIL/uL — AB (ref 3.87–5.11)
RDW: 15.2 % (ref 11.5–15.5)
WBC: 7.5 10*3/uL (ref 4.0–10.5)

## 2016-01-20 LAB — BASIC METABOLIC PANEL
ANION GAP: 7 (ref 5–15)
BUN: 19 mg/dL (ref 6–20)
CALCIUM: 9.2 mg/dL (ref 8.9–10.3)
CO2: 26 mmol/L (ref 22–32)
Chloride: 105 mmol/L (ref 101–111)
Creatinine, Ser: 0.87 mg/dL (ref 0.44–1.00)
GFR calc Af Amer: >60 mL/min (ref >60)
Glucose, Bld: 92 mg/dL (ref 65–99)
POTASSIUM: 3.5 mmol/L (ref 3.5–5.1)
SODIUM: 138 mmol/L (ref 135–145)

## 2016-01-20 NOTE — Telephone Encounter (Signed)
-----   Message from Nunzio Cobbs, MD sent at 01/20/2016  4:44 PM EDT ----- Please contact patient with results and assess her bleeding.  Her hgb has decreased from the previous level of 11.9 which was 2 weeks ago. Scheduled for dilation and curettage for next week with Dr. Sabra Heck.

## 2016-01-20 NOTE — Telephone Encounter (Signed)
Call to patient. Per ROI, can leave detailed message on home and cell numbers.  Left message on cell number that decrease in blood count was noted so we are calling to check on her.  Want her to know MD on call and available during weekend if needed or can go to [facility]. If has increased bleeding, becomes lightheaded, dizzy or SOB, should go to MAU at [facility].  Keep scheduled appointment for Monday in office and call office number if needed before then. Also called home number and left similar type message referring to cell message left.  Routing to Dr Quincy Simmonds, Juluis Rainier. Encounter closed.

## 2016-01-23 ENCOUNTER — Encounter: Payer: Self-pay | Admitting: Obstetrics & Gynecology

## 2016-01-23 ENCOUNTER — Ambulatory Visit (INDEPENDENT_AMBULATORY_CARE_PROVIDER_SITE_OTHER): Payer: BLUE CROSS/BLUE SHIELD | Admitting: Obstetrics & Gynecology

## 2016-01-23 VITALS — BP 126/76 | HR 74 | Resp 16 | Ht 62.75 in | Wt 142.0 lb

## 2016-01-23 DIAGNOSIS — D62 Acute posthemorrhagic anemia: Secondary | ICD-10-CM | POA: Diagnosis not present

## 2016-01-23 DIAGNOSIS — N950 Postmenopausal bleeding: Secondary | ICD-10-CM | POA: Diagnosis not present

## 2016-01-23 DIAGNOSIS — Z79890 Hormone replacement therapy (postmenopausal): Secondary | ICD-10-CM

## 2016-01-23 MED ORDER — DIAZEPAM 5 MG PO TABS: 5.0000 mg | ORAL_TABLET | Freq: Four times a day (QID) | ORAL | Status: DC | PRN

## 2016-01-23 MED ORDER — HYDROCODONE-ACETAMINOPHEN 5-325 MG PO TABS: 1.0000 | ORAL_TABLET | Freq: Four times a day (QID) | ORAL | Status: DC | PRN

## 2016-01-23 NOTE — Progress Notes (Signed)
60 y.o. G3P3 MarriedCaucasian female here due to postmenopausal bleeding.  Pt did have endometrial biopsy on 12/30/15 which was benign.  She is on HRT but the biopsy did not reveal any endometrial tissue changes that suggests HRT is cause of bleeding.  Pt did undergo PUS 01/05/16.  Endometrium was difficult to evaluate due to blood and clot that was present within the endometrium.  As bleeding has continued, hysteroscopy with possible polyp resection, D&C is now recommended.  Procedure discussed with patient.  Recovery and pain management discussed.  Risks discussed including but not limited to bleeding, rare risk of transfusion, infection, 1% risk of uterine perforation with risks of fluid deficit causing cardiac arrythmia, cerebral swelling and/or need to stop procedure early.  Fluid emboli and rare risk of death discussed.  DVT/PE, rare risk of risk of bowel/bladder/ureteral/vascular injury.  Patient aware if pathology abnormal she may need additional treatment.  All questions answered.    Ob Hx:   Patient's last menstrual period was 06/24/2008.          Sexually active: Yes.   Birth control: Post Menopausal  Last pap: 12/30/15 Neg. HR HPV:neg Last MMG: 10/24/15 BIRADS1:neg Tobacco: No  Past Surgical History  Procedure Laterality Date  . Melanoma excision      knee 08 lft. elbow 11rt  . Shoulder surgery Right     x 2 - 1 pin  . Anterior lat lumbar fusion Right 04/17/2013    Procedure: ANTERIOR LATERAL LUMBAR FUSION 3 LEVELS;  Surgeon: Erline Levine, MD;  Location: [place] [facility];  Service: Neurosurgery;  Laterality: Right;  Right Lumbar Two-Three Lumbar Three-Four Lumbar Four-Five  Anterolateral Fusion  . Lumbar percutaneous pedicle screw 3 level N/A 04/17/2013    Procedure: LUMBAR PERCUTANEOUS PEDICLE SCREW 3 LEVEL;  Surgeon: Erline Levine, MD;  Location: [place] [facility];  Service: Neurosurgery;  Laterality: N/A;  Decompression and Posterior Lumbar Interbody Fusion of Lumbar Five-Sacral One, Percutaneous  Screws Lumbar Two through Sacral One  . Tubal ligation    . Cesarean section      x2  . Foot surgery Right     early 20s-bone spur removal  . Wle  06/17/07    and left groin lymph node  . Excision of melanoma      right shoulder  . Shoulder surgery Right 04/28/2015  . Cesarean section      x 2  . Back surgery      L2/3, 3/4, 4/5, screws and rods in back    Past Medical History  Diagnosis Date  . Complication of anesthesia   . PONV (postoperative nausea and vomiting)     "zofran works great  . Hypertension   . Vertigo   . GERD (gastroesophageal reflux disease)   . H/O hiatal hernia   . Acid reflux     GERD  . Heart murmur     no problems  . Seasonal allergies   . Cancer ([place]) 08,11    melanoma-left knee, right arm  . SVD (spontaneous vaginal delivery)     x 1    Allergies: Sulfa antibiotics and Sulfur  Current Outpatient Prescriptions  Medication Sig Dispense Refill  . acetaminophen (TYLENOL) 500 MG tablet Take 1,000 mg by mouth every 6 (six) hours as needed for mild pain.     Marland Kitchen amitriptyline (ELAVIL) 50 MG tablet Take 1 tablet by mouth at bedtime.    . Ascorbic Acid (VITAMIN C WITH ROSE HIPS) 1000 MG tablet Take 1,000 mg by mouth daily.    Marland Kitchen  aspirin 81 MG tablet Take 81 mg by mouth daily.    . calcium carbonate (TUMS EX) 750 MG chewable tablet Chew 2 tablets by mouth at bedtime.    Marland Kitchen CALCIUM-MAGNESIUM PO Take 1 tablet by mouth daily.    . cetirizine (ZYRTEC) 10 MG tablet Take 10 mg by mouth daily.    . Cholecalciferol (VITAMIN D3) 5000 UNITS CAPS Take 1 capsule by mouth daily.    . diazepam (VALIUM) 2 MG tablet Take 1 tablet by mouth daily as needed. Reported on 12/30/2015  0  . estradiol (VIVELLE-DOT) 0.05 MG/24HR patch Place 1 patch (0.05 mg total) onto the skin 2 (two) times a week. 24 patch 4  . Evening Primrose Oil 500 MG CAPS Take 1 capsule by mouth daily.     . famotidine (PEPCID) 10 MG tablet Take 10 mg by mouth daily as needed for heartburn.     Marland Kitchen ibuprofen  (ADVIL,MOTRIN) 200 MG tablet Take 400 mg by mouth every 6 (six) hours as needed for moderate pain.     . Lactobacillus Rhamnosus, GG, (CULTURELLE PO) Take 1 capsule by mouth daily.     Marland Kitchen losartan-hydrochlorothiazide (HYZAAR) 100-12.5 MG per tablet Take 1 tablet by mouth daily.    . meclizine (ANTIVERT) 25 MG tablet Take 25 mg by mouth 3 (three) times daily as needed for dizziness.    . Multiple Vitamin (MULTIVITAMIN WITH MINERALS) TABS tablet Take 1 tablet by mouth daily.    . NON FORMULARY Immune complete    . norethindrone (AYGESTIN) 5 MG tablet Take 1 tablet (5 mg total) by mouth 2 (two) times daily. 30 tablet 0  . ondansetron (ZOFRAN ODT) 4 MG disintegrating tablet 4mg  ODT q4 hours prn nausea/vomit (Patient taking differently: Take 4 mg by mouth every 4 (four) hours as needed for nausea or vomiting. 4mg  ODT q4 hours prn nausea/vomit) 10 tablet 0  . pantoprazole (PROTONIX) 40 MG tablet Take 40 mg by mouth daily.    . polyethylene glycol (MIRALAX / GLYCOLAX) packet Take 8.5 g by mouth daily as needed for mild constipation.    . predniSONE (DELTASONE) 5 MG tablet Take 1 tablet by mouth daily.  0  . progesterone (PROMETRIUM) 100 MG capsule Take 1 capsule (100 mg total) by mouth daily. 90 capsule 4  . tizanidine (ZANAFLEX) 2 MG capsule Take 4 mg by mouth every 6 (six) hours as needed for muscle spasms.    . traMADol (ULTRAM) 50 MG tablet Take 50 mg by mouth every 6 (six) hours as needed for severe pain.    . TURMERIC CURCUMIN PO Take 900 mg by mouth daily.     . vitamin B-12 (CYANOCOBALAMIN) 1000 MCG tablet Take 1,000 mcg by mouth daily.    . vitamin E (VITAMIN E) 400 UNIT capsule Take 400 Units by mouth daily.     No current facility-administered medications for this visit.    ROS: Pertinent items noted in HPI and remainder of comprehensive ROS otherwise negative.  Exam:    LMP 06/24/2008  General appearance: alert and cooperative Head: Normocephalic, without obvious abnormality,  atraumatic Neck: no adenopathy, supple, symmetrical, trachea midline and thyroid not enlarged, symmetric, no tenderness/mass/nodules Lungs: clear to auscultation bilaterally Heart: regular rate and rhythm, S1, S2 normal, no murmur, click, rub or gallop Abdomen: soft, non-tender; bowel sounds normal; no masses,  no organomegaly Extremities: extremities normal, atraumatic, no cyanosis or edema Skin: Skin color, texture, turgor normal. No rashes or lesions Lymph nodes: Cervical, supraclavicular, and axillary nodes  normal. no inguinal nodes palpated Neurologic: Grossly normal  Pelvic: External genitalia:  no lesions              Urethra: normal appearing urethra with no masses, tenderness or lesions              Bartholins and Skenes: Bartholin's, Urethra, Skene's normal                 Vagina: normal appearing vagina with normal color and discharge, no lesions              Cervix: normal appearance              Pap taken: No.        Bimanual Exam:  Uterus:  uterus is normal size, shape, consistency and nontender                                      Adnexa:    normal adnexa in size, nontender and no masses   A: PMP bleeding On HRT with negative endometrial biopsy and normal PUS in early April Mild anemia  P:  Hysteroscopy with possible polyp resection, D&C planned Rx for valium (for back spasm) and vicodin given. Medications/Vitamins reviewed.  Pt knows needs to stop any ASA product. Hysteroscopy brochure given for pre and post op instructions.

## 2016-01-24 ENCOUNTER — Encounter (HOSPITAL_COMMUNITY): Payer: Self-pay | Admitting: Emergency Medicine

## 2016-01-24 ENCOUNTER — Ambulatory Visit (HOSPITAL_COMMUNITY): Payer: BLUE CROSS/BLUE SHIELD | Admitting: Anesthesiology

## 2016-01-24 ENCOUNTER — Encounter (HOSPITAL_COMMUNITY)
Admission: RE | Disposition: A | Discharge status: Home / Self Care | Payer: Self-pay | Source: Ambulatory Visit | Attending: Obstetrics & Gynecology

## 2016-01-24 ENCOUNTER — Ambulatory Visit (HOSPITAL_COMMUNITY)
Admission: RE | Admit: 2016-01-24 | Discharge: 2016-01-24 | Disposition: A | Discharge status: Home / Self Care | Payer: BLUE CROSS/BLUE SHIELD | Source: Ambulatory Visit | Attending: Obstetrics & Gynecology | Admitting: Obstetrics & Gynecology

## 2016-01-24 DIAGNOSIS — N950 Postmenopausal bleeding: Secondary | ICD-10-CM | POA: Insufficient documentation

## 2016-01-24 DIAGNOSIS — N840 Polyp of corpus uteri: Secondary | ICD-10-CM | POA: Diagnosis not present

## 2016-01-24 DIAGNOSIS — Z7982 Long term (current) use of aspirin: Secondary | ICD-10-CM | POA: Insufficient documentation

## 2016-01-24 DIAGNOSIS — I10 Essential (primary) hypertension: Secondary | ICD-10-CM | POA: Insufficient documentation

## 2016-01-24 DIAGNOSIS — K219 Gastro-esophageal reflux disease without esophagitis: Secondary | ICD-10-CM | POA: Diagnosis not present

## 2016-01-24 DIAGNOSIS — Z79890 Hormone replacement therapy (postmenopausal): Secondary | ICD-10-CM | POA: Insufficient documentation

## 2016-01-24 HISTORY — PX: DILATATION & CURETTAGE/HYSTEROSCOPY WITH MYOSURE: SHX6511

## 2016-01-24 HISTORY — DX: Family history of other specified conditions: Z84.89

## 2016-01-24 SURGERY — DILATATION & CURETTAGE/HYSTEROSCOPY WITH MYOSURE
Anesthesia: General | Site: Vagina

## 2016-01-24 MED ORDER — ROCURONIUM BROMIDE 100 MG/10ML IV SOLN
INTRAVENOUS | Status: AC
  Filled 2016-01-24: qty 1

## 2016-01-24 MED ORDER — FENTANYL CITRATE (PF) 100 MCG/2ML IJ SOLN: 25.0000 ug | INTRAMUSCULAR | Status: DC | PRN

## 2016-01-24 MED ORDER — FENTANYL CITRATE (PF) 100 MCG/2ML IJ SOLN
INTRAMUSCULAR | Status: AC
Start: 2016-01-24 — End: 2016-01-24
  Filled 2016-01-24: qty 2

## 2016-01-24 MED ORDER — SUCCINYLCHOLINE CHLORIDE 20 MG/ML IJ SOLN
INTRAMUSCULAR | Status: AC
  Filled 2016-01-24: qty 1

## 2016-01-24 MED ORDER — FENTANYL CITRATE (PF) 100 MCG/2ML IJ SOLN
INTRAMUSCULAR | Status: DC | PRN
  Administered 2016-01-24 (×2): 50 ug via INTRAVENOUS

## 2016-01-24 MED ORDER — LACTATED RINGERS IV SOLN
INTRAVENOUS | Status: DC
  Administered 2016-01-24: 125 mL/h via INTRAVENOUS

## 2016-01-24 MED ORDER — MEPERIDINE HCL 25 MG/ML IJ SOLN: 6.2500 mg | INTRAMUSCULAR | Status: DC | PRN

## 2016-01-24 MED ORDER — LIDOCAINE HCL (CARDIAC) 20 MG/ML IV SOLN
INTRAVENOUS | Status: AC
  Filled 2016-01-24: qty 5

## 2016-01-24 MED ORDER — LIDOCAINE HCL (CARDIAC) 20 MG/ML IV SOLN
INTRAVENOUS | Status: DC | PRN
  Administered 2016-01-24: 20 mg via INTRAVENOUS
  Administered 2016-01-24: 60 mg via INTRAVENOUS

## 2016-01-24 MED ORDER — KETOROLAC TROMETHAMINE 30 MG/ML IJ SOLN: 30.0000 mg | Freq: Once | INTRAMUSCULAR | Status: DC

## 2016-01-24 MED ORDER — SODIUM CHLORIDE 0.9 % IR SOLN
Status: DC | PRN
  Administered 2016-01-24: 3000 mL

## 2016-01-24 MED ORDER — HYDROCODONE-ACETAMINOPHEN 7.5-325 MG PO TABS: 1.0000 | ORAL_TABLET | Freq: Once | ORAL | Status: DC | PRN

## 2016-01-24 MED ORDER — SILVER NITRATE-POT NITRATE 75-25 % EX MISC
CUTANEOUS | Status: AC
  Filled 2016-01-24: qty 1

## 2016-01-24 MED ORDER — ONDANSETRON HCL 4 MG/2ML IJ SOLN
INTRAMUSCULAR | Status: AC
  Filled 2016-01-24: qty 2

## 2016-01-24 MED ORDER — DEXAMETHASONE SODIUM PHOSPHATE 10 MG/ML IJ SOLN
INTRAMUSCULAR | Status: DC | PRN
  Administered 2016-01-24: 4 mg via INTRAVENOUS

## 2016-01-24 MED ORDER — MIDAZOLAM HCL 2 MG/2ML IJ SOLN
INTRAMUSCULAR | Status: DC | PRN
  Administered 2016-01-24: 1 mg via INTRAVENOUS

## 2016-01-24 MED ORDER — ONDANSETRON HCL 4 MG/2ML IJ SOLN: 4.0000 mg | Freq: Once | INTRAMUSCULAR | Status: DC | PRN

## 2016-01-24 MED ORDER — LIDOCAINE-EPINEPHRINE 1 %-1:100000 IJ SOLN
INTRAMUSCULAR | Status: DC | PRN
  Administered 2016-01-24: 10 mL

## 2016-01-24 MED ORDER — KETOROLAC TROMETHAMINE 30 MG/ML IJ SOLN
INTRAMUSCULAR | Status: AC
  Filled 2016-01-24: qty 1

## 2016-01-24 MED ORDER — SCOPOLAMINE 1 MG/3DAYS TD PT72
1.0000 | MEDICATED_PATCH | TRANSDERMAL | Status: DC
  Administered 2016-01-24: 1.5 mg via TRANSDERMAL

## 2016-01-24 MED ORDER — MIDAZOLAM HCL 2 MG/2ML IJ SOLN
INTRAMUSCULAR | Status: AC
  Filled 2016-01-24: qty 2

## 2016-01-24 MED ORDER — PROPOFOL 10 MG/ML IV BOLUS
INTRAVENOUS | Status: DC | PRN
  Administered 2016-01-24: 170 mg via INTRAVENOUS

## 2016-01-24 MED ORDER — LIDOCAINE-EPINEPHRINE 1 %-1:100000 IJ SOLN
INTRAMUSCULAR | Status: AC
  Filled 2016-01-24: qty 1

## 2016-01-24 MED ORDER — PROPOFOL 10 MG/ML IV BOLUS
INTRAVENOUS | Status: AC
  Filled 2016-01-24: qty 20

## 2016-01-24 MED ORDER — SCOPOLAMINE 1 MG/3DAYS TD PT72
MEDICATED_PATCH | TRANSDERMAL | Status: AC
  Administered 2016-01-24: 1.5 mg via TRANSDERMAL
  Filled 2016-01-24: qty 1

## 2016-01-24 MED ORDER — KETOROLAC TROMETHAMINE 30 MG/ML IJ SOLN
INTRAMUSCULAR | Status: DC | PRN
  Administered 2016-01-24: 30 mg via INTRAVENOUS

## 2016-01-24 MED ORDER — DEXAMETHASONE SODIUM PHOSPHATE 4 MG/ML IJ SOLN
INTRAMUSCULAR | Status: AC
  Filled 2016-01-24: qty 1

## 2016-01-24 SURGICAL SUPPLY — 21 items
CANISTER SUCT 3000ML (MISCELLANEOUS) ×3 IMPLANT
CATH ROBINSON RED A/P 16FR (CATHETERS) ×3 IMPLANT
CLOTH BEACON ORANGE TIMEOUT ST (SAFETY) ×3 IMPLANT
CONTAINER PREFILL 10% NBF 60ML (FORM) ×6 IMPLANT
DEVICE MYOSURE LITE (MISCELLANEOUS) ×2 IMPLANT
DEVICE MYOSURE REACH (MISCELLANEOUS) IMPLANT
DILATOR CANAL MILEX (MISCELLANEOUS) IMPLANT
ELECT REM PT RETURN 9FT ADLT (ELECTROSURGICAL)
ELECTRODE REM PT RTRN 9FT ADLT (ELECTROSURGICAL) IMPLANT
FILTER ARTHROSCOPY CONVERTOR (FILTER) ×3 IMPLANT
GLOVE BIOGEL PI IND STRL 7.0 (GLOVE) ×2 IMPLANT
GLOVE BIOGEL PI INDICATOR 7.0 (GLOVE) ×4
GLOVE ECLIPSE 6.5 STRL STRAW (GLOVE) ×6 IMPLANT
GOWN STRL REUS W/TWL LRG LVL3 (GOWN DISPOSABLE) ×6 IMPLANT
PACK VAGINAL MINOR WOMEN LF (CUSTOM PROCEDURE TRAY) ×3 IMPLANT
PAD OB MATERNITY 4.3X12.25 (PERSONAL CARE ITEMS) ×3 IMPLANT
SEAL ROD LENS SCOPE MYOSURE (ABLATOR) ×3 IMPLANT
TOWEL OR 17X24 6PK STRL BLUE (TOWEL DISPOSABLE) ×6 IMPLANT
TUBING AQUILEX INFLOW (TUBING) ×3 IMPLANT
TUBING AQUILEX OUTFLOW (TUBING) ×3 IMPLANT
WATER STERILE IRR 1000ML POUR (IV SOLUTION) ×3 IMPLANT

## 2016-01-24 NOTE — Op Note (Signed)
01/24/2016  9:27 AM  PATIENT:  Melissa Mcclure  60 y.o. female  PRE-OPERATIVE DIAGNOSIS:  PMB  POST-OPERATIVE DIAGNOSIS:  PMB, endometrial polyps  PROCEDURE:  Procedure(s): DILATATION & CURETTAGE/HYSTEROSCOPY WITH MYOSURE  SURGEON:  Reeve Turnley SUZANNE  ASSISTANTS: OR staff   ANESTHESIA:   general  ESTIMATED BLOOD LOSS: 10cc  BLOOD ADMINISTERED:none   FLUIDS: 500cc LR  UOP: 60cc   SPECIMEN:  Endometrial polyps and curettings  DISPOSITION OF SPECIMEN:  PATHOLOGY  FINDINGS: fluffy endometrium and two endometrial polyps  DESCRIPTION OF OPERATION: Patient was taken to the operating room.  She is placed in the supine position. SCDs were on her lower extremities and functioning properly. General anesthesia with an LMA was administered without difficulty.   Legs were then placed in the [place] in the low lithotomy position. The legs were lifted to the high lithotomy position and the Betadine prep was used on the inner thighs perineum and vagina x3. Patient was draped in a normal standard fashion. An in and out catheterization with a red rubber Foley catheter was performed. Approximately 60 cc of clear urine was noted. A bivalve speculum was placed the vagina. The anterior lip of the cervix was grasped with single-tooth tenaculum.  A paracervical block of 1% lidocaine mixed one-to-one with epinephrine (1:100,000 units).  10 cc was used total. The cervix is dilated up to #21 [facility] dilators. The endometrial cavity sounded to 8 cm.   A 2.9 millimeter diagnostic hysteroscope was obtained. Saline was used as a hysteroscopic fluid. The hysteroscope was advanced through the endocervical canal into the endometrial cavity. The tubal ostia were noted bilaterally. There were two anterior polyps that were noted.  Using the Myosure lite, these were resection.  The hysteroscope was removed and then using a #1 toothed curette, the entire endometrial cavity was curetted until rough gritty texture was  noted in all quadrants. The cavity was revisualized.  No additional abnormalities were noted.  The procedure was ended.  The hysteroscope was removed. The fluid deficit was 125 cc. The tenaculum was removed from the anterior lip of the cervix. The speculum was removed from the vagina. The prep was cleansed of the patient's skin. The legs are positioned back in the supine position. Sponge, lap, needle, initially counts were correct x2. Patient was taken to recovery in stable condition.  COUNTS:  YES  PLAN OF CARE: Transfer to PACU

## 2016-01-24 NOTE — Anesthesia Postprocedure Evaluation (Signed)
Anesthesia Post Note  Patient: Melissa Mcclure  Procedure(s) Performed: Procedure(s) (LRB): DILATATION & CURETTAGE/HYSTEROSCOPY WITH MYOSURE (N/A)  Patient location during evaluation: PACU Anesthesia Type: General Level of consciousness: sedated and oriented Pain management: pain level controlled Vital Signs Assessment: vitals unstable Respiratory status: spontaneous breathing Cardiovascular status: stable Postop Assessment: no signs of nausea or vomiting Anesthetic complications: no     Last Vitals:  Filed Vitals:   01/24/16 0605 01/24/16 0748  BP: 122/75 117/67  Pulse: 81 72  Temp: 36.9 C 36.7 C  Resp: 18 20    Last Pain:  Filed Vitals:   01/24/16 0755  PainSc: 3    Pain Goal: Patients Stated Pain Goal: 5 (01/24/16 0605)               Lien Lyman JR,JOHN Mateo Flow

## 2016-01-24 NOTE — Discharge Instructions (Signed)
Post-surgical Instructions, Outpatient Surgery ° °You may expect to feel dizzy, weak, and drowsy for as long as 24 hours after receiving the medicine that made you sleep (anesthetic). For the first 24 hours after your surgery:   °· Do not drive a car, ride a bicycle, participate in physical activities, or take public transportation until you are done taking narcotic pain medicines or as directed by Dr. Nguyen Butler.  °· Do not drink alcohol or take tranquilizers.  °· Do not take medicine that has not been prescribed by your physicians.  °· Do not sign important papers or make important decisions while on narcotic pain medicines.  °· Have a responsible person with you.  ° °PAIN MANAGEMENT °· Motrin 800mg.  (This is the same as 4-200mg over the counter tablets of Motrin or ibuprofen.)  You may take this every eight hours or as needed for cramping.   °· Vicodin 5/325mg.  For more severe pain, take one or two tablets every four to six hours as needed for pain control.  (Remember that narcotic pain medications increase your risk of constipation.  If this becomes a problem, you may take an over the counter stool softener like Colace 100mg up to four times a day.) ° °DO'S AND DON'T'S °· Do not take a tub bath for one week.  You may shower on the first day after your surgery °· Do not do any heavy lifting for one to two weeks.  This increases the chance of bleeding. °· Do move around as you feel able.  Stairs are fine.  You may begin to exercise again as you feel able.  Do not lift any weights for two weeks. °· Do not put anything in the vagina for two weeks--no tampons, intercourse, or douching.   ° °REGULAR MEDIATIONS/VITAMINS: °· You may restart all of your regular medications as prescribed. °· You may restart all of your vitamins as you normally take them.   ° °PLEASE CALL OR SEEK MEDICAL CARE IF: °· You have persistent nausea and vomiting.  °· You have trouble eating or drinking.  °· You have an oral temperature above 100.5.   °· You have constipation that is not helped by adjusting diet or increasing fluid intake. Pain medicines are a common cause of constipation.  °· You have heavy vaginal bleeding °· You have redness or drainage from your incision(s) or there is increasing pain or tenderness near or in the surgical site.  ° ° ° °

## 2016-01-24 NOTE — Anesthesia Procedure Notes (Signed)
Procedure Name: LMA Insertion Date/Time: 01/24/2016 7:20 AM Performed by: Tobin Chad Pre-anesthesia Checklist: Patient identified, Emergency Drugs available, Suction available, Patient being monitored and Timeout performed Patient Re-evaluated:Patient Re-evaluated prior to inductionOxygen Delivery Method: Circle system utilized and Simple face mask Preoxygenation: Pre-oxygenation with 100% oxygen Intubation Type: IV induction Ventilation: Mask ventilation without difficulty LMA Size: 4.0 Grade View: Grade II Number of attempts: 1 Placement Confirmation: positive ETCO2 and breath sounds checked- equal and bilateral Dental Injury: Teeth and Oropharynx as per pre-operative assessment

## 2016-01-24 NOTE — Anesthesia Preprocedure Evaluation (Signed)
Anesthesia Evaluation  Patient identified by MRN, date of birth, ID band Patient awake    Reviewed: Allergy & Precautions, H&P , NPO status , Patient's Chart, lab work & pertinent test results, reviewed documented beta blocker date and time   Airway Mallampati: I  TM Distance: >3 FB Neck ROM: full    Dental no notable dental hx. (+) Teeth Intact   Pulmonary neg pulmonary ROS,    Pulmonary exam normal        Cardiovascular hypertension, Normal cardiovascular exam     Neuro/Psych negative neurological ROS  negative psych ROS   GI/Hepatic Neg liver ROS, GERD  Medicated and Controlled,  Endo/Other  negative endocrine ROS  Renal/GU negative Renal ROS     Musculoskeletal   Abdominal Normal abdominal exam  (+)   Peds  Hematology negative hematology ROS (+)   Anesthesia Other Findings   Reproductive/Obstetrics negative OB ROS                             Anesthesia Physical Anesthesia Plan  ASA: II  Anesthesia Plan: General   Post-op Pain Management:    Induction: Intravenous  Airway Management Planned: LMA  Additional Equipment:   Intra-op Plan:   Post-operative Plan:   Informed Consent: I have reviewed the patients History and Physical, chart, labs and discussed the procedure including the risks, benefits and alternatives for the proposed anesthesia with the patient or authorized representative who has indicated his/her understanding and acceptance.     Plan Discussed with: CRNA and Surgeon  Anesthesia Plan Comments:         Anesthesia Quick Evaluation

## 2016-01-24 NOTE — H&P (Signed)
Melissa Mcclure is an 60 y.o. female here for hysteroscopy and D&C with possible polyp resection due to prolonged PMB.  Pt was on aygestin without improvement.  Ultrasound and endometrial biopsy was negative.  Pt has been counseled about conservative management vs hysteroscopy.  Due to concerns, she has decided to proceed surgically.  Risks and benefits have been reviewed.  Pertinent Gynecological History: Menses: post-menopausal Bleeding: post menopausal bleeding Contraception: post menopausal status DES exposure: denies Blood transfusions: none Sexually transmitted diseases: no past history Previous GYN Procedures: none  Last mammogram: normal Date: 10/24/15 Last pap: normal Date: 10/26/15   Menstrual History: Patient's last menstrual period was 06/24/2008.    Past Medical History  Diagnosis Date  . Complication of anesthesia   . PONV (postoperative nausea and vomiting)     "zofran works great  . Hypertension   . Vertigo   . GERD (gastroesophageal reflux disease)   . H/O hiatal hernia   . Acid reflux     GERD  . Heart murmur     no problems  . Seasonal allergies   . Cancer ([place]) 08,11    melanoma-left knee, right arm  . SVD (spontaneous vaginal delivery)     x 1  . Family history of adverse reaction to anesthesia     mother has PONV    Past Surgical History  Procedure Laterality Date  . Melanoma excision      knee 08 lft. elbow 11rt  . Shoulder surgery Right     x 2 - 1 pin  . Anterior lat lumbar fusion Right 04/17/2013    Procedure: ANTERIOR LATERAL LUMBAR FUSION 3 LEVELS;  Surgeon: Erline Levine, MD;  Location: [facility];  Service: Neurosurgery;  Laterality: Right;  Right Lumbar Two-Three Lumbar Three-Four Lumbar Four-Five  Anterolateral Fusion  . Lumbar percutaneous pedicle screw 3 level N/A 04/17/2013    Procedure: LUMBAR PERCUTANEOUS PEDICLE SCREW 3 LEVEL;  Surgeon: Erline Levine, MD;  Location: [facility];  Service: Neurosurgery;  Laterality: N/A;  Decompression and  Posterior Lumbar Interbody Fusion of Lumbar Five-Sacral One, Percutaneous Screws Lumbar Two through Sacral One  . Tubal ligation    . Cesarean section      x2  . Foot surgery Right     early 20s-bone spur removal  . Wle  06/17/07    and left groin lymph node  . Excision of melanoma      right shoulder  . Shoulder surgery Right 04/28/2015  . Cesarean section      x 2  . Back surgery      L2/3, 3/4, 4/5, screws and rods in back    Family History  Problem Relation Age of Onset  . Diabetes Father     borderline  . Melanoma Father   . Colon cancer Mother   . Cancer Maternal Grandmother     "female"  . Hypertension Mother   . Congestive Heart Failure Father   . Heart disease Paternal Grandfather   . Osteoporosis Mother     Social History:  reports that she has never smoked. She has never used smokeless tobacco. She reports that she drinks alcohol. She reports that she does not use illicit drugs.  Allergies:  Allergies  Allergen Reactions  . Sulfur     Other reaction(s): HIVES    Prescriptions prior to admission  Medication Sig Dispense Refill Last Dose  . acetaminophen (TYLENOL) 500 MG tablet Take 1,000 mg by mouth every 6 (six) hours as needed  for mild pain. Reported on 01/23/2016   01/23/2016 at Unknown time  . amitriptyline (ELAVIL) 50 MG tablet Take 1 tablet by mouth at bedtime. Reported on 01/23/2016   01/23/2016 at Unknown time  . aspirin 81 MG tablet Take 81 mg by mouth daily. Reported on 01/23/2016   Past Month at Unknown time  . calcium carbonate (TUMS EX) 750 MG chewable tablet Chew 2 tablets by mouth at bedtime. Reported on 01/23/2016   Past Month at Unknown time  . CALCIUM-MAGNESIUM PO Take 1 tablet by mouth daily. Reported on 01/23/2016   Not Taking  . cetirizine (ZYRTEC) 10 MG tablet Take 10 mg by mouth daily. Reported on 01/23/2016   01/24/2016 at Unknown time  . diazepam (VALIUM) 5 MG tablet Take 1 tablet (5 mg total) by mouth every 6 (six) hours as needed for anxiety. 30 tablet  0 01/24/2016 at Unknown time  . losartan-hydrochlorothiazide (HYZAAR) 100-12.5 MG per tablet Take 1 tablet by mouth daily. Reported on 01/23/2016   01/24/2016 at Unknown time  . Multiple Vitamin (MULTIVITAMIN WITH MINERALS) TABS tablet Take 1 tablet by mouth daily. Reported on 01/23/2016   Not Taking  . pantoprazole (PROTONIX) 40 MG tablet Take 40 mg by mouth daily. Reported on 01/23/2016   01/24/2016 at Unknown time  . polyethylene glycol (MIRALAX / GLYCOLAX) packet Take 8.5 g by mouth daily as needed for mild constipation. Reported on 01/23/2016   Not Taking  . tizanidine (ZANAFLEX) 2 MG capsule Take 4 mg by mouth every 6 (six) hours as needed for muscle spasms. Reported on 01/23/2016   Taking  . traMADol (ULTRAM) 50 MG tablet Take 50 mg by mouth every 6 (six) hours as needed for severe pain. Reported on 01/23/2016   01/23/2016 at Unknown time  . Ascorbic Acid (VITAMIN C WITH ROSE HIPS) 1000 MG tablet Take 1,000 mg by mouth daily. Reported on 01/23/2016   Not Taking  . Cholecalciferol (VITAMIN D3) 5000 UNITS CAPS Take 1 capsule by mouth daily. Reported on 01/23/2016   Not Taking  . diazepam (VALIUM) 2 MG tablet Take 1 tablet by mouth daily as needed. Reported on 01/23/2016  0 Not Taking  . estradiol (VIVELLE-DOT) 0.05 MG/24HR patch Place 1 patch (0.05 mg total) onto the skin 2 (two) times a week. 24 patch 4 Taking  . Evening Primrose Oil 500 MG CAPS Take 1 capsule by mouth daily. Reported on 01/23/2016   Not Taking  . famotidine (PEPCID) 10 MG tablet Take 10 mg by mouth daily as needed for heartburn. Reported on 01/23/2016   Not Taking  . HYDROcodone-acetaminophen (NORCO/VICODIN) 5-325 MG tablet Take 1-2 tablets by mouth every 6 (six) hours as needed for moderate pain or severe pain. 12 tablet 0   . ibuprofen (ADVIL,MOTRIN) 200 MG tablet Take 400 mg by mouth every 6 (six) hours as needed for moderate pain. Reported on 01/23/2016   Not Taking  . Lactobacillus Rhamnosus, GG, (CULTURELLE PO) Take 1 capsule by mouth daily. Reported on  01/23/2016   Not Taking  . meclizine (ANTIVERT) 25 MG tablet Take 25 mg by mouth 3 (three) times daily as needed for dizziness. Reported on 01/23/2016   Not Taking  . NON FORMULARY Reported on 01/23/2016   Not Taking  . norethindrone (AYGESTIN) 5 MG tablet Take 1 tablet (5 mg total) by mouth 2 (two) times daily. (Patient taking differently: Take 5 mg by mouth daily. ) 30 tablet 0 Taking  . ondansetron (ZOFRAN ODT) 4 MG disintegrating tablet 4mg   ODT q4 hours prn nausea/vomit (Patient not taking: Reported on 01/23/2016) 10 tablet 0 Not Taking  . predniSONE (DELTASONE) 5 MG tablet Take 1 tablet by mouth daily. Reported on 01/23/2016  0 Not Taking  . progesterone (PROMETRIUM) 100 MG capsule Take 1 capsule (100 mg total) by mouth daily. 90 capsule 4 Taking  . TURMERIC CURCUMIN PO Take 900 mg by mouth daily. Reported on 01/23/2016   Not Taking  . vitamin B-12 (CYANOCOBALAMIN) 1000 MCG tablet Take 1,000 mcg by mouth daily. Reported on 01/23/2016   Not Taking  . vitamin E (VITAMIN E) 400 UNIT capsule Take 400 Units by mouth daily. Reported on 01/23/2016   Not Taking    Review of Systems  All other systems reviewed and are negative.   Blood pressure 122/75, pulse 81, temperature 98.4 F (36.9 C), temperature source Oral, resp. rate 18, last menstrual period 06/24/2008, SpO2 100 %. Physical Exam  Constitutional: She is oriented to person, place, and time. She appears well-developed and well-nourished.  Cardiovascular: Normal rate and regular rhythm.   Respiratory: Effort normal and breath sounds normal.  Neurological: She is alert and oriented to person, place, and time.  Skin: Skin is warm and dry.  Psychiatric: She has a normal mood and affect.    No results found for this or any previous visit (from the past 24 hour(s)).  No results found.  Assessment/Plan: 60 yo MWF here for hysteroscopy and D&C due to prolonged PMP bleeding, on HRT.  Pt here and ready to proceed.  Hale Bogus SUZANNE 01/24/2016, 7:06  AM

## 2016-01-24 NOTE — Transfer of Care (Signed)
Immediate Anesthesia Transfer of Care Note  Patient: Melissa Mcclure  Procedure(s) Performed: Procedure(s): DILATATION & CURETTAGE/HYSTEROSCOPY WITH MYOSURE (N/A)  Patient Location: PACU  Anesthesia Type:General  Level of Consciousness: awake, alert , oriented and patient cooperative  Airway & Oxygen Therapy: Patient Spontanous Breathing and Patient connected to nasal cannula oxygen  Post-op Assessment: Report given to RN and Post -op Vital signs reviewed and stable  Post vital signs: Reviewed and stable  Last Vitals:  Filed Vitals:   01/24/16 0605  BP: 122/75  Pulse: 81  Temp: 36.9 C  Resp: 18    Last Pain:  Filed Vitals:   01/24/16 0606  PainSc: 3       Patients Stated Pain Goal: 5 ([id number])  Complications: No apparent anesthesia complications

## 2016-01-25 ENCOUNTER — Encounter (HOSPITAL_COMMUNITY): Payer: Self-pay | Admitting: Obstetrics & Gynecology

## 2016-01-29 ENCOUNTER — Telehealth: Payer: Self-pay | Admitting: Obstetrics and Gynecology

## 2016-01-29 NOTE — Telephone Encounter (Signed)
The patient called. She had a hysteroscopy, polypectomy, D&C 5 days ago. She has had only minimal spotting up until today. When she woke up and stood up, she felt a "gush", it about filled her panty liner. I reviewed her op note and pathology report. I explained to the patient that any blood that accumulated over night came out when she stood up. I reassured her that her bleeding was not excessive. I recommended she call if she is saturating a regular pad in 1-2 hours. I told her to call at any time with concerns and that I would let Dr Sabra Heck know.

## 2016-01-30 DIAGNOSIS — M419 Scoliosis, unspecified: Secondary | ICD-10-CM | POA: Diagnosis not present

## 2016-01-30 DIAGNOSIS — M545 Low back pain: Secondary | ICD-10-CM | POA: Diagnosis not present

## 2016-01-30 DIAGNOSIS — M5415 Radiculopathy, thoracolumbar region: Secondary | ICD-10-CM | POA: Diagnosis not present

## 2016-01-30 DIAGNOSIS — M4316 Spondylolisthesis, lumbar region: Secondary | ICD-10-CM | POA: Diagnosis not present

## 2016-02-01 DIAGNOSIS — Z1159 Encounter for screening for other viral diseases: Secondary | ICD-10-CM | POA: Diagnosis not present

## 2016-02-01 DIAGNOSIS — Z0000 Encounter for general adult medical examination without abnormal findings: Secondary | ICD-10-CM | POA: Diagnosis not present

## 2016-02-01 DIAGNOSIS — G4700 Insomnia, unspecified: Secondary | ICD-10-CM | POA: Diagnosis not present

## 2016-02-01 DIAGNOSIS — R42 Dizziness and giddiness: Secondary | ICD-10-CM | POA: Diagnosis not present

## 2016-02-01 DIAGNOSIS — E871 Hypo-osmolality and hyponatremia: Secondary | ICD-10-CM | POA: Diagnosis not present

## 2016-02-01 DIAGNOSIS — I10 Essential (primary) hypertension: Secondary | ICD-10-CM | POA: Diagnosis not present

## 2016-02-06 ENCOUNTER — Ambulatory Visit (INDEPENDENT_AMBULATORY_CARE_PROVIDER_SITE_OTHER): Payer: BLUE CROSS/BLUE SHIELD | Admitting: Obstetrics & Gynecology

## 2016-02-06 ENCOUNTER — Encounter: Payer: Self-pay | Admitting: Obstetrics & Gynecology

## 2016-02-06 VITALS — BP 138/78 | HR 84 | Resp 16 | Wt 140.8 lb

## 2016-02-06 DIAGNOSIS — D509 Iron deficiency anemia, unspecified: Secondary | ICD-10-CM

## 2016-02-06 LAB — CBC
HEMATOCRIT: 37.9 % (ref 35.0–45.0)
HEMOGLOBIN: 12.6 g/dL (ref 11.7–15.5)
MCH: 31.1 pg (ref 27.0–33.0)
MCHC: 33.2 g/dL (ref 32.0–36.0)
MCV: 93.6 fL (ref 80.0–100.0)
MPV: 9.3 fL (ref 7.5–12.5)
PLATELETS: 349 10*3/uL (ref 140–400)
RBC: 4.05 MIL/uL (ref 3.80–5.10)
RDW: 13.9 % (ref 11.0–15.0)
WBC: 8.7 10*3/uL (ref 3.8–10.8)

## 2016-02-06 NOTE — Progress Notes (Addendum)
Post Operative Visit  Procedure:DILATATION & CURETTAGE/HYSTEROSCOPY WITH MYOSURE Days Post-op: 13 days   Subjective: Pt reports she had heavy bleeding the Sunday after her procedure.  This stopped fairly quickly and she had some spotting for a couple of days.  She states "she's back to normal".  HRT dosing discussed.  She is on the estradiol 0.05mg  twice weekly and Prometrium 100mg  daily.  I do not think we should change this dosage.  Had blood work last week with Dr. Alyson Ingles.  She did have Hep C testing done with this blood work.  Will do tetanus update with next visit.  Had mediation about her back issues related to work on May 24th.  Is having a back MRI ordered by Dr. Vertell Limber.    Objective: LMP 06/24/2008 Vitals reviewed.  EXAM General: alert and cooperative Resp: clear to auscultation bilaterally Cardio: regular rate and rhythm, S1, S2 normal, no murmur, click, rub or gallop GI: soft, non-tender; bowel sounds normal; no masses,  no organomegaly Extremities: extremities normal, atraumatic, no cyanosis or edema Vaginal Bleeding: none  Gyn:  NAEFG, vaginal pink and moist, no vaginal bleeding, uterus non-tender  Assessment: s/p hysteroscopy, polyp resection due to PMP bleeding On HRT, will not make any changes  Plan: CBC today AEX 1 year or follow-up prn

## 2016-02-09 ENCOUNTER — Other Ambulatory Visit: Payer: Self-pay | Admitting: Neurosurgery

## 2016-02-09 DIAGNOSIS — M5415 Radiculopathy, thoracolumbar region: Secondary | ICD-10-CM

## 2016-02-09 DIAGNOSIS — M545 Low back pain: Secondary | ICD-10-CM

## 2016-02-13 ENCOUNTER — Telehealth: Payer: Self-pay | Admitting: Obstetrics & Gynecology

## 2016-02-13 NOTE — Telephone Encounter (Signed)
Patient has a "personal question" for Dr.Miller. Patient did not want to give details.

## 2016-02-13 NOTE — Telephone Encounter (Signed)
Spoke with patient. Patient states she is in need of a letter for work stating she is unable to lift anything over 10 pounds. "Dr.Miller knows what is going on. I got an email from my attorney late Friday requesting this. I am asking my other doctor for this, but I need this quick and was hoping Dr.Miller could help me." Patient had a D&C on 01/24/2016. Also states that around 02/09/2016 she began to have light spotting that is dark brown/red in color. She is wearing a panty liner and is not having to change it, but once per day. Denies any abdominal/pelvic discomfort. Denies any fever, chills, or odor to discharge. "I just want to make sure this is normal." Advised I will speak with Dr.Miller and return call with further recommendations. She is agreeable.

## 2016-02-14 NOTE — Telephone Encounter (Signed)
If the letter is needed for her back issues, I cannot write it.  I'm sorry.  She is fully released from limitations related to her hysteroscopy.

## 2016-02-15 NOTE — Telephone Encounter (Addendum)
Spoke with patient. Advised of message as seen below from [place]. She is agreeable and verbalizes understanding. Patient is asking about the spotting she is having (please see note below). Patient had a D&C on 01/24/2016. On 02/09/2016 she began to have light spotting that is dark brown/red in color. Reports she is still experiencing light spotting today. She will be going out of town this weekend and is concerned. Denies any fever, chills, or odor to discharge/blood. She is wearing a panty liner which she is changing once per day. Advise I will speak with a covering provider and return call with further recommendations. She is agreeable.  Routing to Dr.Silva for review as Dr.Miller is out of the office today.

## 2016-02-15 NOTE — Telephone Encounter (Signed)
Spoke with patient. Appointment scheduled for 02/16/2016 at 3 pm with Dr.Miller. She is agreeable to date and time.  Cc: Dr.Miller  Routing to covering provider for final review. Patient agreeable to disposition. Will close encounter.

## 2016-02-15 NOTE — Telephone Encounter (Signed)
Surgery date in the computer is listed as 01/24/16.  Please make an appointment for the patient to see Dr. Sabra Heck.   Cc- Dr. Sabra Heck

## 2016-02-16 ENCOUNTER — Encounter: Payer: Self-pay | Admitting: Obstetrics & Gynecology

## 2016-02-16 ENCOUNTER — Ambulatory Visit (INDEPENDENT_AMBULATORY_CARE_PROVIDER_SITE_OTHER): Payer: BLUE CROSS/BLUE SHIELD | Admitting: Obstetrics & Gynecology

## 2016-02-16 VITALS — BP 110/70 | HR 82 | Resp 16 | Wt 142.0 lb

## 2016-02-16 DIAGNOSIS — N840 Polyp of corpus uteri: Secondary | ICD-10-CM | POA: Diagnosis not present

## 2016-02-16 DIAGNOSIS — N950 Postmenopausal bleeding: Secondary | ICD-10-CM

## 2016-02-16 NOTE — Progress Notes (Signed)
GYNECOLOGY  VISIT   HPI: 60 y.o. G3P3 Married Caucasian female with history of PMP bleeding while on HRT.  Hysteroscopy and D&C were performed 01/24/16.  Polyp was removed.  Benign pathology was found.  Pt has spotting for several days after procedure but this stopped.  Bleeding has started again.  It is light and spotty.  Denies any cramping or clotting.  No lightheadedness.  No SOB .  No palpitations.  Pt is leaving for [place] on Sunday and is just a little anxious she is still bleeding.  GYNECOLOGIC HISTORY: Patient's last menstrual period was 06/24/2008.   Patient Active Problem List   Diagnosis Date Noted  . Hiatal hernia 12/30/2015  . Malignant melanoma of lower leg ([place]) 03/10/2015  . History of melanoma 10/12/2013  . GERD (gastroesophageal reflux disease) 10/12/2013  . Essential hypertension, benign 10/12/2013    Past Medical History  Diagnosis Date  . Complication of anesthesia   . PONV (postoperative nausea and vomiting)     "zofran works great  . Hypertension   . Vertigo   . GERD (gastroesophageal reflux disease)   . H/O hiatal hernia   . Acid reflux     GERD  . Heart murmur     no problems  . Seasonal allergies   . Cancer ([place]) 08,11    melanoma-left knee, right arm  . SVD (spontaneous vaginal delivery)     x 1  . Family history of adverse reaction to anesthesia     mother has PONV    Past Surgical History  Procedure Laterality Date  . Melanoma excision      knee 08 lft. elbow 11rt  . Shoulder surgery Right     x 2 - 1 pin  . Anterior lat lumbar fusion Right 04/17/2013    Procedure: ANTERIOR LATERAL LUMBAR FUSION 3 LEVELS;  Surgeon: Erline Levine, MD;  Location: [facility];  Service: Neurosurgery;  Laterality: Right;  Right Lumbar Two-Three Lumbar Three-Four Lumbar Four-Five  Anterolateral Fusion  . Lumbar percutaneous pedicle screw 3 level N/A 04/17/2013    Procedure: LUMBAR PERCUTANEOUS PEDICLE SCREW 3 LEVEL;  Surgeon: Erline Levine, MD;  Location: [facility];  Service: Neurosurgery;  Laterality: N/A;  Decompression and Posterior Lumbar Interbody Fusion of Lumbar Five-Sacral One, Percutaneous Screws Lumbar Two through Sacral One  . Tubal ligation    . Cesarean section      x2  . Foot surgery Right     early 20s-bone spur removal  . Wle  06/17/07    and left groin lymph node  . Excision of melanoma      right shoulder  . Shoulder surgery Right 04/28/2015  . Cesarean section      x 2  . Back surgery      L2/3, 3/4, 4/5, screws and rods in back  . Dilatation & curettage/hysteroscopy with myosure N/A 01/24/2016    Procedure: DILATATION & CURETTAGE/HYSTEROSCOPY WITH MYOSURE;  Surgeon: Megan Salon, MD;  Location: [facility];  Service: Gynecology;  Laterality: N/A;    MEDS:  Reviewed in EPIC and UTD  ALLERGIES: Sulfur  Family History  Problem Relation Age of Onset  . Diabetes Father     borderline  . Melanoma Father   . Colon cancer Mother   . Cancer Maternal Grandmother     "female"  . Hypertension Mother   . Congestive Heart Failure Father   . Heart disease Paternal Grandfather   . Osteoporosis Mother     SH:  Married, non smoker  Review of Systems  All other systems reviewed and are negative.   PHYSICAL EXAMINATION:    BP 110/70 mmHg  Pulse 82  Resp 16  Wt 142 lb (64.411 kg)  LMP 06/24/2008    General appearance: alert, cooperative and appears stated age CV:  Regular rate and rhythm Lungs:  clear to auscultation, no wheezes, rales or rhonchi, symmetric air entry Abdomen: soft, non-tender; bowel sounds normal; no masses,  no organomegaly  Pelvic: External genitalia:  no lesions              Urethra:  normal appearing urethra with no masses, tenderness or lesions              Bartholins and Skenes: normal                 Vagina: normal appearing vagina with normal color and discharge, no lesions              Cervix: no lesions, scant mucous/blood discharge at cervical os noted              Bimanual Exam:  Uterus:   normal size, contour, position, consistency, mobility, non-tender              Adnexa: no mass, fullness, tenderness              Anus:  normal sphincter tone, no lesions  Chaperone was present for exam.  Assessment: Continued PMP spotting after hysteroscopy performed 01/24/16.   Benign endometrial polyp Nontender exam today  Plan: Increase prometrium to 200mg  nightly.  She's just going to take 2-100mg  and will call when needs RF sent the express scripts.   Advised pt I have no concerns for her upcoming trip and I think this is just due to post operative healing.  No evidence of infection today.  Pt reassured.

## 2016-02-17 ENCOUNTER — Ambulatory Visit
Admission: RE | Admit: 2016-02-17 | Discharge: 2016-02-17 | Disposition: A | Discharge status: Home / Self Care | Payer: BLUE CROSS/BLUE SHIELD | Source: Ambulatory Visit | Attending: Neurosurgery | Admitting: Neurosurgery

## 2016-02-17 DIAGNOSIS — M4326 Fusion of spine, lumbar region: Secondary | ICD-10-CM | POA: Diagnosis not present

## 2016-02-17 DIAGNOSIS — M5124 Other intervertebral disc displacement, thoracic region: Secondary | ICD-10-CM | POA: Diagnosis not present

## 2016-02-17 DIAGNOSIS — M545 Low back pain: Secondary | ICD-10-CM

## 2016-02-17 DIAGNOSIS — M5415 Radiculopathy, thoracolumbar region: Secondary | ICD-10-CM

## 2016-02-17 MED ORDER — GADOBENATE DIMEGLUMINE 529 MG/ML IV SOLN
13.0000 mL | Freq: Once | INTRAVENOUS | Status: AC | PRN
  Administered 2016-02-17: 13 mL via INTRAVENOUS

## 2016-02-27 ENCOUNTER — Telehealth: Payer: Self-pay | Admitting: Obstetrics & Gynecology

## 2016-02-27 DIAGNOSIS — N950 Postmenopausal bleeding: Secondary | ICD-10-CM

## 2016-02-27 NOTE — Telephone Encounter (Signed)
Patient says she started bleeding again on Friday after being on Prometrium.

## 2016-02-27 NOTE — Telephone Encounter (Signed)
I think she needs a repeat PUS.  Thanks.

## 2016-02-27 NOTE — Telephone Encounter (Signed)
Spoke with patient. Patient had a Hysteroscopy D&C on 01/24/2016. Polyp was removed which was benign. Patient was seen on 02/16/2016 with Dr.Miller for bleeding following D&C. Dr.Miller increased her Prometrium dosage to 200 mg daily. Reports shortly after increasing her Prometrium her bleeding stopped. On Friday 02/24/2016 she reports she began having light spotting that is red/brown in color. Denies any pelvic pain or heavy bleeding. Patient is still having light spotting today. Denies missing any pills. Patient is asking what she needs to do at this time. Advised I will speak with Dr.Miller and return call with further recommendations. She is agreeable.

## 2016-02-28 NOTE — Telephone Encounter (Signed)
Spoke with patient. Advised of message as seen below from [place]. She is agreeable and verbalizes understanding. PUS scheduled for 03/01/2016 at 1:30 pm with 2 om consult with Dr.Miller. She is agreeable to date and time. Order placed for precert.  Cc: Lerry Liner  Routing to provider for final review. Patient agreeable to disposition. Will close encounter.

## 2016-03-01 ENCOUNTER — Encounter: Payer: Self-pay | Admitting: Obstetrics & Gynecology

## 2016-03-01 ENCOUNTER — Ambulatory Visit (INDEPENDENT_AMBULATORY_CARE_PROVIDER_SITE_OTHER): Payer: BLUE CROSS/BLUE SHIELD

## 2016-03-01 ENCOUNTER — Ambulatory Visit (INDEPENDENT_AMBULATORY_CARE_PROVIDER_SITE_OTHER): Payer: BLUE CROSS/BLUE SHIELD | Admitting: Obstetrics & Gynecology

## 2016-03-01 VITALS — BP 138/92 | HR 74 | Resp 16 | Wt 142.0 lb

## 2016-03-01 DIAGNOSIS — D251 Intramural leiomyoma of uterus: Secondary | ICD-10-CM | POA: Diagnosis not present

## 2016-03-01 DIAGNOSIS — N950 Postmenopausal bleeding: Secondary | ICD-10-CM | POA: Diagnosis not present

## 2016-03-01 NOTE — Progress Notes (Signed)
60 y.o. G3P3 Married Caucasian female here for pelvic ultrasound due to continued PMP bleeding after hysteroscopy with polyp resection showing benign polyp and inactive endometrium.  Pt's HRT was changed from 100mg  to 200mg  Prometrium about two weeks ago.  Pt states she is not bleeding today.  Pap 4/17 neg with neg HR HPV.  Patient's last menstrual period was 06/24/2008.  Contraception: PMP  Findings:  UTERUS: 7.4 x 5.5 x 5.1cm with two intramural fibroids measuring 20 and 65mm EMS:2.8-6.45mm, measured in multiple different locations.  Borders are ill-defined. ADNEXA: Left ovary: 2.1 x 1.2 x 0.8cm       Right ovary: 2.1 x 1.1 x 0.6cm CUL DE SAC: no free fluid  Discussion:  Findings reviewed with pt.  As she is just over a month from her procedure and her pathology was negative, I do not feel she needs additional testing at this time.  Will plan to stay on the 200mg  Prometrium daily and not change her HRT dosage.  Pt will follow up in two months and will call if has heavy bleeding.  If the spotting continues, she states she will want to discuss hysterectomy at that time and I will be open to that discussion.  Pt comfortable with plan.  Assessment:  PMP bleeding H/O polyp resection Intramural fibroids  Plan:  Keep menstrual calender and recheck in two months.  Pt knows to call with any heavy bleeding.    ~15 minutes spent with patient >50% of time was in face to face discussion of above.

## 2016-03-07 DIAGNOSIS — M533 Sacrococcygeal disorders, not elsewhere classified: Secondary | ICD-10-CM | POA: Diagnosis not present

## 2016-03-07 DIAGNOSIS — M545 Low back pain: Secondary | ICD-10-CM | POA: Diagnosis not present

## 2016-03-07 DIAGNOSIS — M4316 Spondylolisthesis, lumbar region: Secondary | ICD-10-CM | POA: Diagnosis not present

## 2016-03-07 DIAGNOSIS — Z6825 Body mass index (BMI) 25.0-25.9, adult: Secondary | ICD-10-CM | POA: Diagnosis not present

## 2016-03-20 DIAGNOSIS — C4360 Malignant melanoma of unspecified upper limb, including shoulder: Secondary | ICD-10-CM | POA: Diagnosis not present

## 2016-03-20 DIAGNOSIS — Z6825 Body mass index (BMI) 25.0-25.9, adult: Secondary | ICD-10-CM | POA: Diagnosis not present

## 2016-03-20 DIAGNOSIS — C4372 Malignant melanoma of left lower limb, including hip: Secondary | ICD-10-CM | POA: Diagnosis not present

## 2016-03-22 DIAGNOSIS — M533 Sacrococcygeal disorders, not elsewhere classified: Secondary | ICD-10-CM | POA: Diagnosis not present

## 2016-03-28 ENCOUNTER — Telehealth: Payer: Self-pay | Admitting: Obstetrics & Gynecology

## 2016-03-28 MED ORDER — PROGESTERONE MICRONIZED 200 MG PO CAPS: 200.0000 mg | ORAL_CAPSULE | Freq: Every day | ORAL | Status: DC

## 2016-03-28 NOTE — Telephone Encounter (Signed)
Medication refill request: Prometrium (pateint requesting 200mg  to take daily instead of 100 twice daily) Last AEX:  12-30-15 Next AEX: 01-07-17 Last MMG (if hormonal medication request): 10-24-15 WNL Refill authorized: please advise  Sending to JJ since SM is out of the office

## 2016-03-28 NOTE — Telephone Encounter (Signed)
Patient is asking for a refill of progesterone (PROMETRIUM) 100 MG capsule. Patient would like 200 mg instead of taking two 100mg . Confirmed pharmacy with patient.

## 2016-03-29 NOTE — Telephone Encounter (Signed)
Patient notified that prescription was sent in by Dr. Talbert Nan.  She has a follow up appointment with Dr. Sabra Heck in August.  Will follow up prn and as scheduled.

## 2016-04-16 DIAGNOSIS — M5415 Radiculopathy, thoracolumbar region: Secondary | ICD-10-CM | POA: Diagnosis not present

## 2016-04-16 DIAGNOSIS — M4316 Spondylolisthesis, lumbar region: Secondary | ICD-10-CM | POA: Diagnosis not present

## 2016-04-16 DIAGNOSIS — M545 Low back pain: Secondary | ICD-10-CM | POA: Diagnosis not present

## 2016-04-16 DIAGNOSIS — M533 Sacrococcygeal disorders, not elsewhere classified: Secondary | ICD-10-CM | POA: Diagnosis not present

## 2016-05-07 ENCOUNTER — Encounter: Payer: Self-pay | Admitting: Obstetrics & Gynecology

## 2016-05-07 ENCOUNTER — Ambulatory Visit (INDEPENDENT_AMBULATORY_CARE_PROVIDER_SITE_OTHER): Payer: BLUE CROSS/BLUE SHIELD | Admitting: Obstetrics & Gynecology

## 2016-05-07 VITALS — BP 126/70 | HR 80 | Resp 12 | Ht 63.0 in | Wt 141.4 lb

## 2016-05-07 DIAGNOSIS — D251 Intramural leiomyoma of uterus: Secondary | ICD-10-CM | POA: Diagnosis not present

## 2016-05-07 DIAGNOSIS — N950 Postmenopausal bleeding: Secondary | ICD-10-CM | POA: Diagnosis not present

## 2016-05-07 DIAGNOSIS — N926 Irregular menstruation, unspecified: Secondary | ICD-10-CM | POA: Diagnosis not present

## 2016-05-07 DIAGNOSIS — IMO0002 Reserved for concepts with insufficient information to code with codable children: Secondary | ICD-10-CM

## 2016-05-07 DIAGNOSIS — Z0000 Encounter for general adult medical examination without abnormal findings: Secondary | ICD-10-CM

## 2016-05-07 LAB — HEMOGLOBIN, FINGERSTICK: HEMOGLOBIN, FINGERSTICK: 12.8 g/dL (ref 12.0–16.0)

## 2016-05-11 ENCOUNTER — Telehealth: Payer: Self-pay | Admitting: Obstetrics & Gynecology

## 2016-05-11 DIAGNOSIS — D251 Intramural leiomyoma of uterus: Secondary | ICD-10-CM | POA: Insufficient documentation

## 2016-05-11 NOTE — Progress Notes (Signed)
GYNECOLOGY  VISIT   HPI: 60 y.o. G3P3 Married Caucasian female with persistent PMP bleeding despite having hysteroscopy with D&C and polyp resection in may of this year.  Pathology showed: FRAGMENTS OF BENIGN ENDOMETRIAL TYPE POLYP. - BACKGROUND INACTIVE ENDOMETRIUM. - BENIGN SQUAMOUS EPITHELIUM. - BENIGN SMOOTH MUSCLE. - NO HYPERPLASIA OR MALIGNANCY.  Pt had follow-up PUS done 03/01/16 without significant findings except for known fibroids.    Pt has spotted almost every day since then.  Bleeding is not heavy.  She denies pelvic pain.  She is on HRT and I have changed her progesterone dosing, as well, to see if this will resolve the problem.  Thus far, I have been unsuccessful at getting the bleeding to stop.  Pt does not want to stop her HRT.  In 2015, she attempted to stop her HRT and use Paxil instead.  She was miserable off her HRT and is now on a little higher dosage than she was in 2015.  It has taken more estrogen to control symptoms.  She feels strongly about this.  Discussed with pt, at this point, there is nothing to do but repeat the PUS and hysteroscopy vs stopping HRT (which she declines) vs hysterectomy.  As she has spotted so much this year, I am in favor of proceeding with a hysterectomy.  She and I have discussed this previously.  She has many "events" planned this fall and does have mediation about her back issues and being out of work scheduled for the beginning of September.  She needs to get through this before doing anything else.    She wants to discuss with spouse and with then make final decision.    Pt does request hb today as she is feeling a little like she did when she had mild anemia.  Hb was normal.    GYNECOLOGIC HISTORY: Patient's last menstrual period was 06/24/2008. Menopausal hormone therapy: Vivelle dot 0.05mg  patches/prometrium 200mg  daily  Patient Active Problem List   Diagnosis Date Noted  . Hiatal hernia 12/30/2015  . Malignant melanoma of lower leg  ([place]) 03/10/2015  . History of melanoma 10/12/2013  . GERD (gastroesophageal reflux disease) 10/12/2013  . Essential hypertension, benign 10/12/2013    Past Medical History:  Diagnosis Date  . Acid reflux    GERD  . Cancer ([place]) 08,11   melanoma-left knee, right arm  . Complication of anesthesia   . Family history of adverse reaction to anesthesia    mother has PONV  . GERD (gastroesophageal reflux disease)   . H/O hiatal hernia   . Heart murmur    no problems  . Hypertension   . PONV (postoperative nausea and vomiting)    "zofran works great  . Seasonal allergies   . SVD (spontaneous vaginal delivery)    x 1  . Vertigo     Past Surgical History:  Procedure Laterality Date  . ANTERIOR LAT LUMBAR FUSION Right 04/17/2013   Procedure: ANTERIOR LATERAL LUMBAR FUSION 3 LEVELS;  Surgeon: Erline Levine, MD;  Location: [facility];  Service: Neurosurgery;  Laterality: Right;  Right Lumbar Two-Three Lumbar Three-Four Lumbar Four-Five  Anterolateral Fusion  . [place]  . CESAREAN SECTION  1980  . DILATATION & CURETTAGE/HYSTEROSCOPY WITH MYOSURE N/A 01/24/2016   Procedure: DILATATION & CURETTAGE/HYSTEROSCOPY WITH MYOSURE;  Surgeon: Megan Salon, MD;  Location: [facility];  Service: Gynecology;  Laterality: N/A;  . FOOT SURGERY Right    early 20s-bone spur removal  . LUMBAR PERCUTANEOUS  PEDICLE SCREW 3 LEVEL N/A 04/17/2013   Procedure: LUMBAR PERCUTANEOUS PEDICLE SCREW 3 LEVEL;  Surgeon: Erline Levine, MD;  Location: [facility];  Service: Neurosurgery;  Laterality: N/A;  Decompression and Posterior Lumbar Interbody Fusion of Lumbar Five-Sacral One, Percutaneous Screws Lumbar Two through Sacral One  . MELANOMA EXCISION     knee 08 lft. elbow 11rt  . SHOULDER SURGERY Right 04/28/2015  . TUBAL LIGATION  1986  . WLE  06/17/07   and left groin lymph node    MEDS:  Reviewed in EPIC and UTD  ALLERGIES: Sulfur  Family History  Problem Relation Age of Onset  . Diabetes Father      borderline  . Melanoma Father   . Colon cancer Mother   . Cancer Maternal Grandmother     "female"  . Hypertension Mother   . Congestive Heart Failure Father   . Heart disease Paternal Grandfather   . Osteoporosis Mother     SH:  Married, non smoker  Review of Systems  All other systems reviewed and are negative.   PHYSICAL EXAMINATION:    BP 126/70 (BP Location: Right Arm, Patient Position: Sitting)   Pulse 80   Resp 12   Ht 5\' 3"  (1.6 m)   Wt 141 lb 6.4 oz (64.1 kg)   LMP 06/24/2008   BMI 25.05 kg/m     General appearance: alert, cooperative and appears stated age No other exam performed today  Assessment: Persistent PMP bleeding On HRT H/O small uterine fibroids Lightheaded  Plan: Pt will decide about repeat hysteroscopy vs hysterectomy.  She will need repeat PUS and I will repeat an endometrial biopsy if she decides to proceed with hysterectomy.  TLH/BSO/cystoscopy will be done if she decides to proceed with hysterectomy. No changes made today with HRT.   ~25 minutes spent with patient >50% of time was in face to face discussion of above.

## 2016-05-11 NOTE — Telephone Encounter (Signed)
Patient has questions for nurse regarding climbing stairs after surgery and dates for surgery.

## 2016-05-11 NOTE — Telephone Encounter (Signed)
Call to patient. Surgery date options discussed with patient. Tentative date of 06-26-16 and patient will call next week for final confirmation. Patient will need to move up and down approximately 14 stairs the week after surgery. Advised this will be fine to do with caution the first time or two. Should not do it multiple times a day but otherwise, this is ok. Dr Sabra Heck will review call and will call her back if additional recommendations.  Routing to provider for final review. Patient agreeable to disposition. Will close encounter.

## 2016-05-24 ENCOUNTER — Other Ambulatory Visit: Payer: Self-pay | Admitting: Obstetrics & Gynecology

## 2016-05-24 ENCOUNTER — Telehealth: Payer: Self-pay | Admitting: *Deleted

## 2016-05-24 DIAGNOSIS — N950 Postmenopausal bleeding: Secondary | ICD-10-CM

## 2016-05-24 NOTE — Telephone Encounter (Signed)
Surgery scheduled for 06-26-16 at 0730 at [facility]. Call to patient to schedule pelvic ultrasound/consult and review instructions. Left message to call back.

## 2016-05-24 NOTE — Telephone Encounter (Signed)
Patient returned Sally's call. °

## 2016-05-24 NOTE — Telephone Encounter (Signed)
Patient returned call. Confirmed surgery date and appointments scheduled. Surgery instruction sheet reviewed and printed copy mailed to patient, see copy scanned to chart. Patient instructed to take Motrin 800 mg one hour prior to endometrial biopsy appointment with food.   Routing to provider for final review. Patient agreeable to disposition. Will close encounter.

## 2016-06-06 DIAGNOSIS — M5415 Radiculopathy, thoracolumbar region: Secondary | ICD-10-CM | POA: Diagnosis not present

## 2016-06-06 DIAGNOSIS — M545 Low back pain: Secondary | ICD-10-CM | POA: Diagnosis not present

## 2016-06-06 DIAGNOSIS — M419 Scoliosis, unspecified: Secondary | ICD-10-CM | POA: Diagnosis not present

## 2016-06-06 DIAGNOSIS — M4316 Spondylolisthesis, lumbar region: Secondary | ICD-10-CM | POA: Diagnosis not present

## 2016-06-07 ENCOUNTER — Encounter: Payer: Self-pay | Admitting: Obstetrics & Gynecology

## 2016-06-07 ENCOUNTER — Ambulatory Visit (INDEPENDENT_AMBULATORY_CARE_PROVIDER_SITE_OTHER): Payer: BLUE CROSS/BLUE SHIELD

## 2016-06-07 ENCOUNTER — Ambulatory Visit (INDEPENDENT_AMBULATORY_CARE_PROVIDER_SITE_OTHER): Payer: BLUE CROSS/BLUE SHIELD | Admitting: Obstetrics & Gynecology

## 2016-06-07 VITALS — BP 108/86 | HR 80 | Resp 14 | Ht 63.0 in | Wt 142.0 lb

## 2016-06-07 DIAGNOSIS — N950 Postmenopausal bleeding: Secondary | ICD-10-CM

## 2016-06-07 DIAGNOSIS — N840 Polyp of corpus uteri: Secondary | ICD-10-CM | POA: Diagnosis not present

## 2016-06-07 DIAGNOSIS — D251 Intramural leiomyoma of uterus: Secondary | ICD-10-CM

## 2016-06-07 MED ORDER — OXYCODONE-ACETAMINOPHEN 5-325 MG PO TABS: 1.0000 | ORAL_TABLET | Freq: Four times a day (QID) | ORAL | 0 refills | Status: DC | PRN

## 2016-06-07 NOTE — Patient Instructions (Signed)
Stop your evening of primrose and Vit E 7 days before surgery.

## 2016-06-07 NOTE — Progress Notes (Signed)
60 y.o. G3P3 Married Caucasian female here for discussion of continue PMP bleeding.  She has decided that she does not want to stop her HRT.  Although, she knows if she does her bleeding would likely stop.  She had decided she wants to proceed with hysterectomy.  TLH/BSO/cystoscopy planned.  At last visit, she was not completely sure of decision.  She was aware that I wanted to repeat the PUS and endometrial biopsy as well today.  She is comfortable with this plan.  She has not spotted in a couple of weeks so she knows she could decide to continue to watch and see if the bleeding occurs again.  She is sick of bleeding and has spotted more days this calendar year than not, so she is ready to proceed.  Procedure discussed with patient.  Hospital stay, recovery and pain management all discussed.  Risks discussed including but not limited to bleeding, 1% risk of receiving a  transfusion, infection, 3-4% risk of bowel/bladder/ureteral/vascular injury discussed as well as possible need for additional surgery if injury does occur discussed.  DVT/PE and rare risk of death discussed.  My actual complications with prior surgeries discussed.  Vaginal cuff dehiscence discussed.  Hernia formation discussed.  Positioning and incision locations discussed.  Patient aware if pathology abnormal she may need additional treatment.  All questions answered.     Ob Hx:   Patient's last menstrual period was 06/24/2008.          Sexually active: Yes.   Birth control: Post Menopausal  Last pap: 12/30/15 Neg. HR HPV:neg  Last MMG: 10/24/15 BIRADS1:neg  Tobacco: No  Past Surgical History:  Procedure Laterality Date  . ANTERIOR LAT LUMBAR FUSION Right 04/17/2013   Procedure: ANTERIOR LATERAL LUMBAR FUSION 3 LEVELS;  Surgeon: Erline Levine, MD;  Location: [facility];  Service: Neurosurgery;  Laterality: Right;  Right Lumbar Two-Three Lumbar Three-Four Lumbar Four-Five  Anterolateral Fusion  . [place]  . CESAREAN  SECTION  1980  . DILATATION & CURETTAGE/HYSTEROSCOPY WITH MYOSURE N/A 01/24/2016   Procedure: DILATATION & CURETTAGE/HYSTEROSCOPY WITH MYOSURE;  Surgeon: Megan Salon, MD;  Location: [facility];  Service: Gynecology;  Laterality: N/A;  . FOOT SURGERY Right    early 20s-bone spur removal  . LUMBAR PERCUTANEOUS PEDICLE SCREW 3 LEVEL N/A 04/17/2013   Procedure: LUMBAR PERCUTANEOUS PEDICLE SCREW 3 LEVEL;  Surgeon: Erline Levine, MD;  Location: [facility];  Service: Neurosurgery;  Laterality: N/A;  Decompression and Posterior Lumbar Interbody Fusion of Lumbar Five-Sacral One, Percutaneous Screws Lumbar Two through Sacral One  . MELANOMA EXCISION     knee 08 lft. elbow 11rt  . SHOULDER SURGERY Right 04/28/2015  . TUBAL LIGATION  1986  . WLE  06/17/07   and left groin lymph node    Past Medical History:  Diagnosis Date  . Acid reflux    GERD  . Cancer ([place]) 08,11   melanoma-left knee, right arm  . Complication of anesthesia   . Family history of adverse reaction to anesthesia    mother has PONV  . GERD (gastroesophageal reflux disease)   . H/O hiatal hernia   . Heart murmur    no problems  . Hypertension   . PONV (postoperative nausea and vomiting)    "zofran works great  . Seasonal allergies   . SVD (spontaneous vaginal delivery)    x 1  . Vertigo     Allergies: Sulfur  Current Outpatient Prescriptions  Medication Sig Dispense Refill  .  acetaminophen (TYLENOL) 500 MG tablet Take 1,000 mg by mouth every 6 (six) hours as needed for mild pain. Reported on 01/23/2016    . amitriptyline (ELAVIL) 50 MG tablet Take 1 tablet by mouth at bedtime. Reported on 01/23/2016    . Ascorbic Acid (VITAMIN C WITH ROSE HIPS) 1000 MG tablet Take 1,000 mg by mouth daily. Reported on 01/23/2016    . calcium carbonate (TUMS EX) 750 MG chewable tablet Chew 2 tablets by mouth at bedtime. Reported on 01/23/2016    . CALCIUM-MAGNESIUM PO Take 1 tablet by mouth daily. Reported on 01/23/2016    . cetirizine (ZYRTEC) 10 MG  tablet Take 10 mg by mouth daily. Reported on 01/23/2016    . Cholecalciferol (VITAMIN D3) 5000 UNITS CAPS Take 1 capsule by mouth daily. Reported on 01/23/2016    . estradiol (VIVELLE-DOT) 0.05 MG/24HR patch Place 1 patch (0.05 mg total) onto the skin 2 (two) times a week. 24 patch 4  . Evening Primrose Oil 500 MG CAPS Take 1 capsule by mouth daily. Reported on 01/23/2016    . famotidine (PEPCID) 10 MG tablet Take 10 mg by mouth daily as needed for heartburn. Reported on 01/23/2016    . ibuprofen (ADVIL,MOTRIN) 200 MG tablet Take 400 mg by mouth every 6 (six) hours as needed for moderate pain. Reported on 01/23/2016    . Lactobacillus Rhamnosus, GG, (CULTURELLE PO) Take 1 capsule by mouth daily. Reported on 01/23/2016    . losartan-hydrochlorothiazide (HYZAAR) 100-12.5 MG per tablet Take 1 tablet by mouth daily. Reported on 01/23/2016    . meclizine (ANTIVERT) 25 MG tablet Take 25 mg by mouth 3 (three) times daily as needed for dizziness. Reported on 01/23/2016    . Multiple Vitamin (MULTIVITAMIN WITH MINERALS) TABS tablet Take 1 tablet by mouth daily. Reported on 01/23/2016    . ondansetron (ZOFRAN ODT) 4 MG disintegrating tablet 4mg  ODT q4 hours prn nausea/vomit 10 tablet 0  . pantoprazole (PROTONIX) 40 MG tablet Take 40 mg by mouth daily. Reported on 01/23/2016    . polyethylene glycol (MIRALAX / GLYCOLAX) packet Take 8.5 g by mouth daily as needed for mild constipation. Reported on 01/23/2016    . progesterone (PROMETRIUM) 200 MG capsule Take 1 capsule (200 mg total) by mouth daily. 90 capsule 3  . tizanidine (ZANAFLEX) 2 MG capsule Take 4 mg by mouth every 6 (six) hours as needed for muscle spasms. Reported on 01/23/2016    . traMADol (ULTRAM) 50 MG tablet Take 50 mg by mouth every 6 (six) hours as needed for severe pain. Reported on 01/23/2016    . TURMERIC CURCUMIN PO Take 900 mg by mouth daily. Reported on 01/23/2016    . vitamin B-12 (CYANOCOBALAMIN) 1000 MCG tablet Take 1,000 mcg by mouth daily. Reported on 01/23/2016     . vitamin E (VITAMIN E) 400 UNIT capsule Take 400 Units by mouth daily. Reported on 01/23/2016    . HYDROcodone-acetaminophen (NORCO/VICODIN) 5-325 MG tablet Take 1-2 tablets by mouth every 6 (six) hours as needed for moderate pain or severe pain. (Patient not taking: Reported on 06/07/2016) 12 tablet 0   No current facility-administered medications for this visit.     ROS: Pertinent items noted in HPI and remainder of comprehensive ROS otherwise negative.  Exam:    BP 108/86 (BP Location: Right Arm, Patient Position: Sitting, Cuff Size: Normal)   Pulse 80   Resp 14   Ht 5\' 3"  (1.6 m)   Wt 142 lb (64.4 kg)  LMP 06/24/2008   BMI 25.15 kg/m   General appearance: alert and cooperative Head: Normocephalic, without obvious abnormality, atraumatic Neck: no adenopathy, supple, symmetrical, trachea midline and thyroid not enlarged, symmetric, no tenderness/mass/nodules Lungs: clear to auscultation bilaterally Heart: regular rate and rhythm, S1, S2 normal, no murmur, click, rub or gallop Abdomen: soft, non-tender; bowel sounds normal; no masses,  no organomegaly Extremities: extremities normal, atraumatic, no cyanosis or edema Skin: Skin color, texture, turgor normal. No rashes or lesions Lymph nodes: Cervical, supraclavicular, and axillary nodes normal. no inguinal nodes palpated Neurologic: Grossly normal  Pelvic: External genitalia:  no lesions              Urethra: normal appearing urethra with no masses, tenderness or lesions              Bartholins and Skenes: normal                 Vagina: normal appearing vagina with normal color and discharge, no lesions              Cervix: normal appearance              Pap taken: No.        Bimanual Exam:  Uterus:  uterus is normal size, shape, consistency and nontender                                      Adnexa:    normal adnexa in size, nontender and no masses                                      Rectovaginal: Deferred                                       Anus:  normal sphincter tone, no lesions  Ultrasound reviewed with pt: Uterus 9.4 x 5.5 x 3.7cm with 1.3 x 1.0 and 1.6 x 1.2cm fibroids Endometrium:  1.42mm Left ovary:  2.7 x 1.6 x 1.64mm Right ovary 2.7 x 1.4 x 0.77mm Cul de sac:  No free fluid  Endometrial biopsy recommended.  Discussed with patient.  Verbal and written consent obtained.   Procedure:  Speculum placed.  Cervix visualized and cleansed with betadine prep.  A single toothed tenaculum was applied to the anterior lip of the cervix.  Endometrial pipelle was advanced through the cervix into the endometrial cavity without difficulty.  Pipelle passed to 7cm.  Suction applied and pipelle removed with good tissue sample obtained.  Tenculum removed.  No bleeding noted.  Patient tolerated procedure well.  A:  Persistent PMP bleeding On HRT H/O intramural fibroids H/o endometrial polyp removed earlier this year Prior hx of cesarean section x 2 Back spasms  P:  TLH/BSO/cystoscopy planned Rx for Motrin and Percocet given. Medications/Vitamins reviewed.  Pt knows needs to stop supplements and any ASA product. Pt will use pre-op valium just like before the hysteroscopy to help prevent any back spasms Hysterectomy brochure given for pre and post op instructions.  ~25 minutes spent with patient >50% of time was in face to face discussion of above.

## 2016-06-10 ENCOUNTER — Encounter: Payer: Self-pay | Admitting: Obstetrics & Gynecology

## 2016-06-11 NOTE — Patient Instructions (Signed)
Your procedure is scheduled on:  Tuesday, Oct. 3, 2017  Enter through the [employer] [facility] at:  6:00 AM  Pick up the phone at the desk and dial [phone].  Call this number if you have problems the morning of surgery: [phone].  Remember: Do NOT eat food or drink after:  Midnight Monday, Oct. 2, 2017  Take these medicines the morning of surgery with a SIP OF WATER:  Losartan, Pantoprazole  Stop taking Vitamin C, Vitamin E, and Turmeric at this time  Do NOT wear jewelry (body piercing), metal hair clips/bobby pins, make-up, or nail polish. Do NOT wear lotions, powders, or perfumes.  You may wear deodorant. Do NOT shave for 48 hours prior to surgery. Do NOT bring valuables to the hospital. Contacts, dentures, or bridgework may not be worn into surgery.  Leave suitcase in car.  After surgery it may be brought to your room.  For patients admitted to the hospital, checkout time is 11:00 AM the day of discharge.

## 2016-06-12 ENCOUNTER — Encounter (HOSPITAL_COMMUNITY): Payer: Self-pay

## 2016-06-12 ENCOUNTER — Encounter (HOSPITAL_COMMUNITY)
Admission: RE | Admit: 2016-06-12 | Discharge: 2016-06-12 | Disposition: A | Discharge status: Home / Self Care | Payer: BLUE CROSS/BLUE SHIELD | Source: Ambulatory Visit | Attending: Obstetrics & Gynecology | Admitting: Obstetrics & Gynecology

## 2016-06-12 DIAGNOSIS — Z79890 Hormone replacement therapy (postmenopausal): Secondary | ICD-10-CM | POA: Diagnosis not present

## 2016-06-12 DIAGNOSIS — Z8262 Family history of osteoporosis: Secondary | ICD-10-CM | POA: Diagnosis not present

## 2016-06-12 DIAGNOSIS — Z808 Family history of malignant neoplasm of other organs or systems: Secondary | ICD-10-CM | POA: Insufficient documentation

## 2016-06-12 DIAGNOSIS — Z01812 Encounter for preprocedural laboratory examination: Secondary | ICD-10-CM | POA: Insufficient documentation

## 2016-06-12 DIAGNOSIS — I10 Essential (primary) hypertension: Secondary | ICD-10-CM | POA: Diagnosis not present

## 2016-06-12 DIAGNOSIS — Z800 Family history of malignant neoplasm of digestive organs: Secondary | ICD-10-CM | POA: Diagnosis not present

## 2016-06-12 DIAGNOSIS — Z85820 Personal history of malignant melanoma of skin: Secondary | ICD-10-CM | POA: Diagnosis not present

## 2016-06-12 DIAGNOSIS — D259 Leiomyoma of uterus, unspecified: Secondary | ICD-10-CM | POA: Insufficient documentation

## 2016-06-12 DIAGNOSIS — K219 Gastro-esophageal reflux disease without esophagitis: Secondary | ICD-10-CM | POA: Diagnosis not present

## 2016-06-12 DIAGNOSIS — Z79899 Other long term (current) drug therapy: Secondary | ICD-10-CM | POA: Insufficient documentation

## 2016-06-12 DIAGNOSIS — Z8249 Family history of ischemic heart disease and other diseases of the circulatory system: Secondary | ICD-10-CM | POA: Diagnosis not present

## 2016-06-12 DIAGNOSIS — Z981 Arthrodesis status: Secondary | ICD-10-CM | POA: Insufficient documentation

## 2016-06-12 DIAGNOSIS — Z882 Allergy status to sulfonamides status: Secondary | ICD-10-CM | POA: Diagnosis not present

## 2016-06-12 DIAGNOSIS — N950 Postmenopausal bleeding: Secondary | ICD-10-CM | POA: Insufficient documentation

## 2016-06-12 HISTORY — DX: Other chronic pain: G89.29

## 2016-06-12 HISTORY — DX: Dorsalgia, unspecified: M54.9

## 2016-06-12 LAB — CBC
HCT: 35.7 % — ABNORMAL LOW (ref 36.0–46.0)
HEMOGLOBIN: 12.3 g/dL (ref 12.0–15.0)
MCH: 31.1 pg (ref 26.0–34.0)
MCHC: 34.5 g/dL (ref 30.0–36.0)
MCV: 90.4 fL (ref 78.0–100.0)
Platelets: 283 10*3/uL (ref 150–400)
RBC: 3.95 MIL/uL (ref 3.87–5.11)
RDW: 14.2 % (ref 11.5–15.5)
WBC: 8.9 10*3/uL (ref 4.0–10.5)

## 2016-06-12 LAB — BASIC METABOLIC PANEL
ANION GAP: 6 (ref 5–15)
BUN: 25 mg/dL — ABNORMAL HIGH (ref 6–20)
CALCIUM: 9.2 mg/dL (ref 8.9–10.3)
CO2: 28 mmol/L (ref 22–32)
Chloride: 100 mmol/L — ABNORMAL LOW (ref 101–111)
Creatinine, Ser: 0.85 mg/dL (ref 0.44–1.00)
Glucose, Bld: 87 mg/dL (ref 65–99)
Potassium: 3.7 mmol/L (ref 3.5–5.1)
SODIUM: 134 mmol/L — AB (ref 135–145)

## 2016-06-23 ENCOUNTER — Emergency Department (HOSPITAL_COMMUNITY)
Admission: EM | Admit: 2016-06-23 | Discharge: 2016-06-23 | Disposition: A | Discharge status: Home / Self Care | Payer: BLUE CROSS/BLUE SHIELD | Attending: Emergency Medicine | Admitting: Emergency Medicine

## 2016-06-23 ENCOUNTER — Encounter (HOSPITAL_COMMUNITY): Payer: Self-pay

## 2016-06-23 DIAGNOSIS — I10 Essential (primary) hypertension: Secondary | ICD-10-CM | POA: Diagnosis not present

## 2016-06-23 DIAGNOSIS — Y9389 Activity, other specified: Secondary | ICD-10-CM | POA: Insufficient documentation

## 2016-06-23 DIAGNOSIS — S161XXA Strain of muscle, fascia and tendon at neck level, initial encounter: Secondary | ICD-10-CM | POA: Diagnosis not present

## 2016-06-23 DIAGNOSIS — Y999 Unspecified external cause status: Secondary | ICD-10-CM | POA: Diagnosis not present

## 2016-06-23 DIAGNOSIS — Y92410 Unspecified street and highway as the place of occurrence of the external cause: Secondary | ICD-10-CM | POA: Insufficient documentation

## 2016-06-23 DIAGNOSIS — S199XXA Unspecified injury of neck, initial encounter: Secondary | ICD-10-CM | POA: Diagnosis present

## 2016-06-23 DIAGNOSIS — Z79899 Other long term (current) drug therapy: Secondary | ICD-10-CM | POA: Insufficient documentation

## 2016-06-23 MED ORDER — NAPROXEN 500 MG PO TABS: 500.0000 mg | ORAL_TABLET | Freq: Two times a day (BID) | ORAL | 0 refills | Status: DC

## 2016-06-23 NOTE — Discharge Instructions (Signed)
Take the tramadol and tizanidine you have at home as needed for pain and muscle spasms. You may take naproxen in addition to or instead of your tramadol. Please follow up with Dr. Noemi Chapel. You might also want to call Dr. Vertell Limber to see if he wants to see you for follow up.
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
## 2016-06-25 MED ORDER — ENOXAPARIN SODIUM 40 MG/0.4ML ~~LOC~~ SOLN
40.0000 mg | SUBCUTANEOUS | Status: DC
  Filled 2016-06-25: qty 0.4

## 2016-06-25 MED ORDER — ENOXAPARIN SODIUM 40 MG/0.4ML ~~LOC~~ SOLN
40.0000 mg | SUBCUTANEOUS | Status: AC
  Administered 2016-06-26: 40 mg via SUBCUTANEOUS
  Filled 2016-06-25: qty 0.4

## 2016-06-25 MED ORDER — DEXTROSE 5 % IV SOLN
2.0000 g | INTRAVENOUS | Status: AC
  Administered 2016-06-26: 2 g via INTRAVENOUS
  Filled 2016-06-25: qty 2

## 2016-06-26 ENCOUNTER — Ambulatory Visit (HOSPITAL_COMMUNITY): Payer: BLUE CROSS/BLUE SHIELD | Admitting: Anesthesiology

## 2016-06-26 ENCOUNTER — Encounter (HOSPITAL_COMMUNITY)
Admission: RE | Disposition: A | Discharge status: Home / Self Care | Payer: Self-pay | Source: Ambulatory Visit | Attending: Obstetrics & Gynecology

## 2016-06-26 ENCOUNTER — Ambulatory Visit (HOSPITAL_COMMUNITY)
Admission: RE | Admit: 2016-06-26 | Discharge: 2016-06-27 | Disposition: A | Discharge status: Home / Self Care | Payer: BLUE CROSS/BLUE SHIELD | Source: Ambulatory Visit | Attending: Obstetrics & Gynecology | Admitting: Obstetrics & Gynecology

## 2016-06-26 ENCOUNTER — Encounter (HOSPITAL_COMMUNITY): Payer: Self-pay | Admitting: *Deleted

## 2016-06-26 DIAGNOSIS — Z79890 Hormone replacement therapy (postmenopausal): Secondary | ICD-10-CM | POA: Diagnosis not present

## 2016-06-26 DIAGNOSIS — N840 Polyp of corpus uteri: Secondary | ICD-10-CM | POA: Diagnosis not present

## 2016-06-26 DIAGNOSIS — N950 Postmenopausal bleeding: Secondary | ICD-10-CM | POA: Diagnosis not present

## 2016-06-26 DIAGNOSIS — K219 Gastro-esophageal reflux disease without esophagitis: Secondary | ICD-10-CM | POA: Diagnosis not present

## 2016-06-26 DIAGNOSIS — D259 Leiomyoma of uterus, unspecified: Secondary | ICD-10-CM | POA: Diagnosis not present

## 2016-06-26 DIAGNOSIS — I10 Essential (primary) hypertension: Secondary | ICD-10-CM | POA: Insufficient documentation

## 2016-06-26 DIAGNOSIS — N800 Endometriosis of uterus: Secondary | ICD-10-CM | POA: Diagnosis not present

## 2016-06-26 DIAGNOSIS — D252 Subserosal leiomyoma of uterus: Secondary | ICD-10-CM | POA: Insufficient documentation

## 2016-06-26 DIAGNOSIS — D251 Intramural leiomyoma of uterus: Secondary | ICD-10-CM | POA: Diagnosis not present

## 2016-06-26 DIAGNOSIS — Z85820 Personal history of malignant melanoma of skin: Secondary | ICD-10-CM | POA: Diagnosis not present

## 2016-06-26 HISTORY — PX: SALPINGOOPHORECTOMY: SHX82

## 2016-06-26 HISTORY — PX: CYSTOSCOPY: SHX5120

## 2016-06-26 HISTORY — PX: LAPAROSCOPIC HYSTERECTOMY: SHX1926

## 2016-06-26 SURGERY — HYSTERECTOMY, TOTAL, LAPAROSCOPIC
Anesthesia: General | Site: Bladder

## 2016-06-26 MED ORDER — MIDAZOLAM HCL 2 MG/2ML IJ SOLN
INTRAMUSCULAR | Status: DC | PRN
  Administered 2016-06-26: 1 mg via INTRAVENOUS

## 2016-06-26 MED ORDER — MEPERIDINE HCL 25 MG/ML IJ SOLN: 6.2500 mg | INTRAMUSCULAR | Status: DC | PRN

## 2016-06-26 MED ORDER — ALUM & MAG HYDROXIDE-SIMETH 200-200-20 MG/5ML PO SUSP: 30.0000 mL | ORAL | Status: DC | PRN

## 2016-06-26 MED ORDER — KETOROLAC TROMETHAMINE 30 MG/ML IJ SOLN: 30.0000 mg | Freq: Four times a day (QID) | INTRAMUSCULAR | Status: DC

## 2016-06-26 MED ORDER — BUPIVACAINE HCL (PF) 0.25 % IJ SOLN
INTRAMUSCULAR | Status: AC
  Filled 2016-06-26: qty 30

## 2016-06-26 MED ORDER — PROPOFOL 10 MG/ML IV BOLUS
INTRAVENOUS | Status: AC
  Filled 2016-06-26: qty 20

## 2016-06-26 MED ORDER — STERILE WATER FOR IRRIGATION IR SOLN
Status: DC | PRN
  Administered 2016-06-26: 1000 mL via INTRAVESICAL

## 2016-06-26 MED ORDER — DEXAMETHASONE SODIUM PHOSPHATE 4 MG/ML IJ SOLN
INTRAMUSCULAR | Status: AC
  Filled 2016-06-26: qty 1

## 2016-06-26 MED ORDER — MORPHINE SULFATE (PF) 4 MG/ML IV SOLN
1.0000 mg | INTRAVENOUS | Status: DC | PRN
  Administered 2016-06-26: 1 mg via INTRAVENOUS
  Filled 2016-06-26: qty 1

## 2016-06-26 MED ORDER — OXYCODONE-ACETAMINOPHEN 5-325 MG PO TABS
1.0000 | ORAL_TABLET | ORAL | Status: DC | PRN
  Administered 2016-06-26 (×2): 1 via ORAL
  Filled 2016-06-26 (×2): qty 1
  Filled 2016-06-26 (×2): qty 2

## 2016-06-26 MED ORDER — FENTANYL CITRATE (PF) 100 MCG/2ML IJ SOLN
INTRAMUSCULAR | Status: DC | PRN
  Administered 2016-06-26: 50 ug via INTRAVENOUS
  Administered 2016-06-26 (×2): 25 ug via INTRAVENOUS
  Administered 2016-06-26: 12.5 ug via INTRAVENOUS
  Administered 2016-06-26 (×2): 50 ug via INTRAVENOUS

## 2016-06-26 MED ORDER — TIZANIDINE HCL 2 MG PO CAPS
4.0000 mg | ORAL_CAPSULE | Freq: Every evening | ORAL | Status: DC | PRN
  Filled 2016-06-26: qty 2

## 2016-06-26 MED ORDER — LIDOCAINE HCL (CARDIAC) 20 MG/ML IV SOLN
INTRAVENOUS | Status: AC
  Filled 2016-06-26: qty 5

## 2016-06-26 MED ORDER — SCOPOLAMINE 1 MG/3DAYS TD PT72
MEDICATED_PATCH | TRANSDERMAL | Status: AC
  Filled 2016-06-26: qty 1

## 2016-06-26 MED ORDER — FENTANYL CITRATE (PF) 250 MCG/5ML IJ SOLN
INTRAMUSCULAR | Status: AC
  Filled 2016-06-26: qty 5

## 2016-06-26 MED ORDER — MIDAZOLAM HCL 2 MG/2ML IJ SOLN
INTRAMUSCULAR | Status: AC
  Filled 2016-06-26: qty 2

## 2016-06-26 MED ORDER — LOSARTAN POTASSIUM-HCTZ 100-12.5 MG PO TABS: 1.0000 | ORAL_TABLET | Freq: Every day | ORAL | Status: DC

## 2016-06-26 MED ORDER — LACTATED RINGERS IV SOLN
INTRAVENOUS | Status: DC
  Administered 2016-06-26 (×2): via INTRAVENOUS

## 2016-06-26 MED ORDER — MECLIZINE HCL 25 MG PO TABS
25.0000 mg | ORAL_TABLET | Freq: Three times a day (TID) | ORAL | Status: DC | PRN
  Filled 2016-06-26: qty 1

## 2016-06-26 MED ORDER — SIMETHICONE 80 MG PO CHEW
80.0000 mg | CHEWABLE_TABLET | Freq: Four times a day (QID) | ORAL | Status: DC | PRN
  Administered 2016-06-26: 80 mg via ORAL
  Filled 2016-06-26: qty 1

## 2016-06-26 MED ORDER — PROPOFOL 10 MG/ML IV BOLUS
INTRAVENOUS | Status: DC | PRN
  Administered 2016-06-26: 180 mg via INTRAVENOUS

## 2016-06-26 MED ORDER — DEXAMETHASONE SODIUM PHOSPHATE 10 MG/ML IJ SOLN
INTRAMUSCULAR | Status: DC | PRN
  Administered 2016-06-26: 4 mg via INTRAVENOUS

## 2016-06-26 MED ORDER — DIAZEPAM 5 MG PO TABS: 5.0000 mg | ORAL_TABLET | Freq: Three times a day (TID) | ORAL | Status: DC | PRN

## 2016-06-26 MED ORDER — SCOPOLAMINE 1 MG/3DAYS TD PT72
1.0000 | MEDICATED_PATCH | Freq: Once | TRANSDERMAL | Status: DC
  Administered 2016-06-26: 1.5 mg via TRANSDERMAL

## 2016-06-26 MED ORDER — KETOROLAC TROMETHAMINE 30 MG/ML IJ SOLN
INTRAMUSCULAR | Status: AC
  Filled 2016-06-26: qty 1

## 2016-06-26 MED ORDER — ACETAMINOPHEN 325 MG PO TABS: 650.0000 mg | ORAL_TABLET | ORAL | Status: DC | PRN

## 2016-06-26 MED ORDER — FAMOTIDINE 20 MG PO TABS
20.0000 mg | ORAL_TABLET | Freq: Two times a day (BID) | ORAL | Status: DC
  Administered 2016-06-26: 20 mg via ORAL
  Filled 2016-06-26: qty 1

## 2016-06-26 MED ORDER — SUGAMMADEX SODIUM 200 MG/2ML IV SOLN
INTRAVENOUS | Status: DC | PRN
  Administered 2016-06-26: 126.2 mg via INTRAVENOUS

## 2016-06-26 MED ORDER — KETOROLAC TROMETHAMINE 30 MG/ML IJ SOLN
INTRAMUSCULAR | Status: DC | PRN
  Administered 2016-06-26: 30 mg via INTRAVENOUS

## 2016-06-26 MED ORDER — ROCURONIUM BROMIDE 100 MG/10ML IV SOLN
INTRAVENOUS | Status: DC | PRN
  Administered 2016-06-26 (×2): 10 mg via INTRAVENOUS
  Administered 2016-06-26: 40 mg via INTRAVENOUS

## 2016-06-26 MED ORDER — SUGAMMADEX SODIUM 200 MG/2ML IV SOLN
INTRAVENOUS | Status: AC
  Filled 2016-06-26: qty 2

## 2016-06-26 MED ORDER — SODIUM CHLORIDE 0.9 % IV SOLN
INTRAVENOUS | Status: DC | PRN
  Administered 2016-06-26: 50 mL

## 2016-06-26 MED ORDER — HYDROMORPHONE HCL 1 MG/ML IJ SOLN
INTRAMUSCULAR | Status: AC
  Filled 2016-06-26: qty 1

## 2016-06-26 MED ORDER — KETOROLAC TROMETHAMINE 30 MG/ML IJ SOLN
30.0000 mg | Freq: Four times a day (QID) | INTRAMUSCULAR | Status: DC
  Administered 2016-06-26 – 2016-06-27 (×2): 30 mg via INTRAVENOUS
  Filled 2016-06-26 (×2): qty 1

## 2016-06-26 MED ORDER — ONDANSETRON HCL 4 MG/2ML IJ SOLN: 4.0000 mg | Freq: Once | INTRAMUSCULAR | Status: DC | PRN

## 2016-06-26 MED ORDER — ONDANSETRON HCL 4 MG/2ML IJ SOLN
INTRAMUSCULAR | Status: AC
  Filled 2016-06-26: qty 2

## 2016-06-26 MED ORDER — AMITRIPTYLINE HCL 50 MG PO TABS
50.0000 mg | ORAL_TABLET | Freq: Every day | ORAL | Status: DC
  Administered 2016-06-26: 50 mg via ORAL
  Filled 2016-06-26: qty 1

## 2016-06-26 MED ORDER — MENTHOL 3 MG MT LOZG: 1.0000 | LOZENGE | OROMUCOSAL | Status: DC | PRN

## 2016-06-26 MED ORDER — ROCURONIUM BROMIDE 100 MG/10ML IV SOLN
INTRAVENOUS | Status: AC
  Filled 2016-06-26: qty 1

## 2016-06-26 MED ORDER — FAMOTIDINE IN NACL 20-0.9 MG/50ML-% IV SOLN
20.0000 mg | Freq: Two times a day (BID) | INTRAVENOUS | Status: DC
  Administered 2016-06-26: 20 mg via INTRAVENOUS
  Filled 2016-06-26 (×2): qty 50

## 2016-06-26 MED ORDER — OXYCODONE HCL 5 MG PO TABS: 5.0000 mg | ORAL_TABLET | Freq: Once | ORAL | Status: DC | PRN

## 2016-06-26 MED ORDER — ONDANSETRON HCL 4 MG/2ML IJ SOLN
INTRAMUSCULAR | Status: DC | PRN
  Administered 2016-06-26: 4 mg via INTRAVENOUS

## 2016-06-26 MED ORDER — BUPIVACAINE HCL (PF) 0.25 % IJ SOLN
INTRAMUSCULAR | Status: DC | PRN
  Administered 2016-06-26: 15 mL

## 2016-06-26 MED ORDER — HYDROCHLOROTHIAZIDE 12.5 MG PO CAPS
12.5000 mg | ORAL_CAPSULE | Freq: Every day | ORAL | Status: DC
  Filled 2016-06-26: qty 1

## 2016-06-26 MED ORDER — SODIUM CHLORIDE 0.9 % IJ SOLN
INTRAMUSCULAR | Status: AC
  Filled 2016-06-26: qty 50

## 2016-06-26 MED ORDER — LOSARTAN POTASSIUM 50 MG PO TABS
100.0000 mg | ORAL_TABLET | Freq: Every day | ORAL | Status: DC
  Filled 2016-06-26: qty 2

## 2016-06-26 MED ORDER — HYDROMORPHONE HCL 1 MG/ML IJ SOLN
0.2500 mg | INTRAMUSCULAR | Status: DC | PRN
  Administered 2016-06-26: 0.5 mg via INTRAVENOUS

## 2016-06-26 MED ORDER — LIDOCAINE HCL (CARDIAC) 20 MG/ML IV SOLN
INTRAVENOUS | Status: DC | PRN
  Administered 2016-06-26: 30 mg via INTRAVENOUS
  Administered 2016-06-26: 70 mg via INTRAVENOUS

## 2016-06-26 MED ORDER — DEXTROSE-NACL 5-0.45 % IV SOLN
INTRAVENOUS | Status: DC
  Administered 2016-06-26: 13:00:00 via INTRAVENOUS

## 2016-06-26 MED ORDER — OXYCODONE HCL 5 MG/5ML PO SOLN: 5.0000 mg | Freq: Once | ORAL | Status: DC | PRN

## 2016-06-26 MED ORDER — ROPIVACAINE HCL 5 MG/ML IJ SOLN
INTRAMUSCULAR | Status: AC
  Filled 2016-06-26: qty 30

## 2016-06-26 SURGICAL SUPPLY — 47 items
APL SRG 38 LTWT LNG FL B (MISCELLANEOUS)
APPLICATOR ARISTA FLEXITIP XL (MISCELLANEOUS) IMPLANT
CABLE HIGH FREQUENCY MONO STRZ (ELECTRODE) IMPLANT
CLOTH BEACON ORANGE TIMEOUT ST (SAFETY) ×4 IMPLANT
COVER BACK TABLE 60X90IN (DRAPES) ×4 IMPLANT
COVER LIGHT HANDLE  1/PK (MISCELLANEOUS) ×4
COVER LIGHT HANDLE 1/PK (MISCELLANEOUS) ×2 IMPLANT
DURAPREP 26ML APPLICATOR (WOUND CARE) ×4 IMPLANT
GLOVE BIOGEL PI IND STRL 7.0 (GLOVE) ×10 IMPLANT
GLOVE BIOGEL PI INDICATOR 7.0 (GLOVE) ×10
GLOVE ECLIPSE 6.5 STRL STRAW (GLOVE) ×12 IMPLANT
GOWN STRL REUS W/TWL LRG LVL3 (GOWN DISPOSABLE) ×16 IMPLANT
HEMOSTAT ARISTA ABSORB 3G PWDR (MISCELLANEOUS) IMPLANT
LIGASURE VESSEL 5MM BLUNT TIP (ELECTROSURGICAL) ×3 IMPLANT
NEEDLE INSUFFLATION 120MM (ENDOMECHANICALS) ×4 IMPLANT
NS IRRIG 1000ML POUR BTL (IV SOLUTION) ×2 IMPLANT
OCCLUDER COLPOPNEUMO (BALLOONS) ×4 IMPLANT
PACK LAPAROSCOPY BASIN (CUSTOM PROCEDURE TRAY) ×4 IMPLANT
PACK TRENDGUARD 450 HYBRID PRO (MISCELLANEOUS) IMPLANT
PACK TRENDGUARD 600 HYBRD PROC (MISCELLANEOUS) IMPLANT
POUCH LAPAROSCOPIC INSTRUMENT (MISCELLANEOUS) ×4 IMPLANT
PROTECTOR NERVE ULNAR (MISCELLANEOUS) ×8 IMPLANT
SCISSORS LAP 5X35 DISP (ENDOMECHANICALS) ×2 IMPLANT
SET CYSTO W/LG BORE CLAMP LF (SET/KITS/TRAYS/PACK) ×2 IMPLANT
SET IRRIG TUBING LAPAROSCOPIC (IRRIGATION / IRRIGATOR) ×4 IMPLANT
SET TRI-LUMEN FLTR TB AIRSEAL (TUBING) ×4 IMPLANT
SHEARS HARMONIC ACE PLUS 36CM (ENDOMECHANICALS) ×2 IMPLANT
SLEEVE ADV FIXATION 5X100MM (TROCAR) ×4 IMPLANT
SOLUTION ELECTROLUBE (MISCELLANEOUS) ×4 IMPLANT
SUT VIC AB 0 CT1 27 (SUTURE) ×8
SUT VIC AB 0 CT1 27XBRD ANBCTR (SUTURE) ×4 IMPLANT
SUT VICRYL 0 UR6 27IN ABS (SUTURE) IMPLANT
SUT VICRYL 4-0 PS2 18IN ABS (SUTURE) ×4 IMPLANT
SUT VLOC 180 0 9IN  GS21 (SUTURE) ×2
SUT VLOC 180 0 9IN GS21 (SUTURE) ×2 IMPLANT
SYR 50ML LL SCALE MARK (SYRINGE) ×8 IMPLANT
TIP UTERINE 5.1X6CM LAV DISP (MISCELLANEOUS) IMPLANT
TIP UTERINE 6.7X10CM GRN DISP (MISCELLANEOUS) IMPLANT
TIP UTERINE 6.7X6CM WHT DISP (MISCELLANEOUS) IMPLANT
TIP UTERINE 6.7X8CM BLUE DISP (MISCELLANEOUS) ×2 IMPLANT
TOWEL OR 17X24 6PK STRL BLUE (TOWEL DISPOSABLE) ×8 IMPLANT
TRENDGUARD 450 HYBRID PRO PACK (MISCELLANEOUS) ×4
TRENDGUARD 600 HYBRID PROC PK (MISCELLANEOUS)
TROCAR ADV FIXATION 5X100MM (TROCAR) ×4 IMPLANT
TROCAR PORT AIRSEAL 8X120 (TROCAR) ×4 IMPLANT
TROCAR XCEL NON-BLD 5MMX100MML (ENDOMECHANICALS) ×4 IMPLANT
WARMER LAPAROSCOPE (MISCELLANEOUS) ×4 IMPLANT

## 2016-06-26 NOTE — Op Note (Signed)
06/26/2016  9:49 AM  PATIENT:  Melissa Mcclure  60 y.o. female  PRE-OPERATIVE DIAGNOSIS:  Recurrent post Menopausal Bleeding, endometrial polyp, uterine fibroids, on HRT, h/o prior cesarean section x 2  POST-OPERATIVE DIAGNOSIS: same, small anterior abdominal wall hernia at left edge of prior cesarean section  PROCEDURE:  Procedure(s): HYSTERECTOMY TOTAL LAPAROSCOPIC BILATERAL SALPINGO OOPHORECTOMY CYSTOSCOPY  SURGEON:  Alvis Edgell SUZANNE  ASSISTANTS: Sumner Boast   ANESTHESIA:   general  ESTIMATED BLOOD LOSS: 40cc  BLOOD ADMINISTERED:none   FLUIDS: 1600cc LR  UOP: 550cc clear UOP  SPECIMEN:  Uterus, cervix, bilateral fallopian tubes and ovaries  DISPOSITION OF SPECIMEN:  PATHOLOGY  FINDINGS: normal appearing pelvic.  Fallopian tubes are s/p tubal ligation.  Small incisional hernia at left edge of prior cesarean section.  DESCRIPTION OF OPERATION: Patient is taken to the operating room. She is placed in the supine position. She is a running IV in place. Informed consent was present on the chart. SCDs on her lower extremities and functioning properly.  Pt was positioned awake due to back spasms with arms tucked by her sides and legs in the high and low lithotomy positions due to back spasms.  This was to ensure she was comfortable during the procedure.  Then general endotracheal anesthesia was administered by the anesthesia staff without difficulty.   Dura prep was then used to prep the abdomen and Betadine was used to prep the inner thighs, perineum and vagina. Once 3 minutes had past the patient was draped in a normal standard fashion. The legs were lifted to the high lithotomy position. The cervix was visualized by placing a heavy weighted speculum in the posterior aspect of the vagina and using a curved Deaver retractor to the retract anteriorly. The anterior lip of the cervix was grasped with single-tooth tenaculum.  The cervix sounded to 8 cm. Pratt dilators were used to  dilate the cervix up to a #21. A RUMI uterine manipulator was obtained. A #8 disposable tip was placed on the RUMI manipulator as well as a medium, silver KOH ring. This was passed through the cervix and the bulb of the disposable tip was inflated with 6cc of normal saline. There was a good fit of the KOH ring around the cervix. The tenaculum was removed. There is also good manipulation of the uterus. The speculum and retractor were removed as well. A Foley catheter was placed to straight drain. The concentrated urine was noted. Legs were lowered to the low lithotomy position and attention was turned the abdomen.  The umbilicus was everted.  A Veress needle was obtained. Syringe of sterile saline was placed on a open Veress needle.  This was passed into the umbilicus until just when the fluid started to drip.  Then low flow CO2 gas was attached the needle and the pneumoperitoneum was achieved without difficulty. Once 3L of gas was in the abdomen the Veress needle was removed and a 5 millimeter non-bladed Optiview trocar and port were passed directly to the abdomen. The laparoscope was then used to confirm intraperitoneal placement. Findgins are as noted above.  Locations for RLQ and LLQ ports were noted by transillumination of the abdominal wall.  0.25% marcaine was used to anesthetize the skin.  85mm skin incisions were made and then 66mm bladed ports were placed.  Finally a midline incision was made about 4 cm above the pubic symphysis.  The skin was incised about 1cm and a non-bladed #8 airseal port was placed with direct visualization of the  laparoscope.    Ureters were identifies.  Attention was turned to theright side. With uterus on stretch the right IP ligament was serially clamped, cauterized and cut using the Ligasure device.  Then the right round ligament was serially clamped cauterized and incised. The anterior and posterior peritoneum of the inferior leaf of the broad ligament were opened. The  beginning of the baldder flap was created.  The bladder was taken down below the level of the KOH ring. The right uterine artery skeletonized and then just superior to the KOH ring this vessel was serially clamped, cauterized, and incised.  Attention was turned the left side.  The uterus was placed on stretch to the opposite side.  Then the left IP ligament was serially clamped, cauterized and incised.  Next the left round ligament was serially clamped cauterized and incised. The anterior posterior peritoneum of the inferiorly for the broad ligament were opened. The anterior peritoneum was carried across to the dissection on the left side. The remainder of the bladder flap was created using the Harmonic scalpel and blunt dissection. The bladder was well below the level of the KOH ring. The left uterine artery skeletonized. Then the left uterine artery, above the level of the KOH ring, was serially clamped cauterized and incised. The uterus was devascularized at this point.  The colpotomy was performed a starting in the midline and using a harmonic scalpel with the inferior edge of the open blade  This was carried around a circumferential fashion until the vaginal mucosa was completely incised in the specimen was freed.  The specimen was then delivered to the vagina.  A vaginal occlusive device was used to maintain the pneumoperitoneum.  Instruments were changed with a needle driver and Kobra graspers.  Using a 9 inch V. lock suture, the cuff was closed by incorporating the anterior and posterior vaginal mucosa in each stitch. This was carried across all the way to the left corner and a running fashion. Two stitches were brought back towards the midline and the suture was cut flush with the vagina. The needle was brought out the pelvis. The pelvis was irrigated. All pedicles were inspected. No bleeding was noted.  Pressure was relieved to ensure adequate anesthesia.  At this point the laparoscopic procedure was  completed. The pneumoperitoneum was relieved after removal of the two lateral ports.  Then the air seal was stopped removing the CO2 from the abdomen.  Several deep breaths were given before removal of the largest port, the suprapubic port, and the umbilical port.  The suprapubic port was closed at the fascial layer with a figure of eight suture of #0 Vicryl.  Suture was tied and excellent closure of the fascia was noted.  The patient was taken out of Trendelenburg positioning.    The skin was then closed with subcuticular stitches of 3-0 Vicryl. The skin was cleansed Dermabond was applied.   Attention was then turned the vagina and the cuff was inspected. No bleeding was noted. The anterior posterior vaginal mucosa was incorporated in each stitch. The Foley catheter was removed.  Cystoscopy was performed.  No sutures or bladder injuries were noted.  Ureters were noted with normal urine jets from each one seen.  Foley was left out after the cystoscopic fluid was drained.  Sponge, lap, needle, initially counts were correct x2. Patient tolerated the procedure very well. She was awakened from anesthesia, extubated and taken to recovery in stable condition.    COUNTS:  YES  PLAN OF  CARE: Transfer to PACU

## 2016-06-26 NOTE — Addendum Note (Signed)
Addendum  created 06/26/16 1826 by Asher Muir, CRNA   Sign clinical note

## 2016-06-26 NOTE — H&P (Signed)
Melissa Mcclure is an 60 y.o. female  G3P3 MWF here for total laparoscopic hysterectomy due to recurrent PMP bleeding in pt on HRT.  She underwent hysteroscopy with polyp resection in May and the bleeding continued.  Attempts at changing her progesterone have been unsuccessful.  Pt has undergone repeat ultrasound and biopsy and no sginificant findings were noted except for fibroids, which are not new.  Pt has decided to proceed with definitive surgery and desires removal of tubes and ovaries as well.  Risks, benefits, alternatives have been reviewed.  Pt here and ready to proceed.  Pertinent Gynecological History: Menses: post-menopausal Bleeding: post menopausal bleeding Contraception: PMP DES exposure: denies Blood transfusions: none Sexually transmitted diseases: no past history Previous GYN Procedures: hysteroscopy  Last mammogram: normal Date: 1/17 Last pap: normal Date: 4/17 OB History: G3, P3   Menstrual History: Patient's last menstrual period was 06/24/2008.    Past Medical History:  Diagnosis Date  . Acid reflux    GERD  . Cancer ([place]) 08,11   melanoma-left knee, right arm  . Chronic back pain   . Complication of anesthesia   . Family history of adverse reaction to anesthesia    mother has PONV  . GERD (gastroesophageal reflux disease)   . H/O hiatal hernia   . Heart murmur    no problems  . Hypertension   . PONV (postoperative nausea and vomiting)    "zofran works great  . Seasonal allergies   . SVD (spontaneous vaginal delivery)    x 1  . Vertigo     Past Surgical History:  Procedure Laterality Date  . ANTERIOR LAT LUMBAR FUSION Right 04/17/2013   Procedure: ANTERIOR LATERAL LUMBAR FUSION 3 LEVELS;  Surgeon: Erline Levine, MD;  Location: [facility];  Service: Neurosurgery;  Laterality: Right;  Right Lumbar Two-Three Lumbar Three-Four Lumbar Four-Five  Anterolateral Fusion  . [place]  . CESAREAN SECTION  1980  . COLONOSCOPY    . DILATATION &  CURETTAGE/HYSTEROSCOPY WITH MYOSURE N/A 01/24/2016   Procedure: DILATATION & CURETTAGE/HYSTEROSCOPY WITH MYOSURE;  Surgeon: Megan Salon, MD;  Location: [facility];  Service: Gynecology;  Laterality: N/A;  . FOOT SURGERY Right    early 20s-bone spur removal  . LUMBAR PERCUTANEOUS PEDICLE SCREW 3 LEVEL N/A 04/17/2013   Procedure: LUMBAR PERCUTANEOUS PEDICLE SCREW 3 LEVEL;  Surgeon: Erline Levine, MD;  Location: [facility];  Service: Neurosurgery;  Laterality: N/A;  Decompression and Posterior Lumbar Interbody Fusion of Lumbar Five-Sacral One, Percutaneous Screws Lumbar Two through Sacral One  . MELANOMA EXCISION     knee 08 lft. elbow 11rt  . SHOULDER SURGERY Right 04/28/2015  . TUBAL LIGATION  1986  . WLE  06/17/07   and left groin lymph node    Family History  Problem Relation Age of Onset  . Diabetes Father     borderline  . Melanoma Father   . Congestive Heart Failure Father   . Colon cancer Mother   . Hypertension Mother   . Osteoporosis Mother   . Cancer Maternal Grandmother     "female"  . Heart disease Paternal Grandfather     Social History:  reports that she has never smoked. She has never used smokeless tobacco. She reports that she drinks alcohol. She reports that she does not use drugs.  Allergies:  Allergies  Allergen Reactions  . Sulfur Hives    Prescriptions Prior to Admission  Medication Sig Dispense Refill Last Dose  . acetaminophen (TYLENOL)  500 MG tablet Take 1,000 mg by mouth every 6 (six) hours as needed for mild pain. Reported on 01/23/2016   06/25/2016 at Unknown time  . amitriptyline (ELAVIL) 50 MG tablet Take 1 tablet by mouth at bedtime. Reported on 01/23/2016   06/25/2016 at Unknown time  . Ascorbic Acid (VITAMIN C WITH ROSE HIPS) 1000 MG tablet Take 1,000 mg by mouth daily. Reported on 01/23/2016   Past Month at Unknown time  . calcium carbonate (TUMS EX) 750 MG chewable tablet Chew 2 tablets by mouth at bedtime. Reported on 01/23/2016   Past Month at Unknown time   . CALCIUM-MAGNESIUM PO Take 1 tablet by mouth daily. Reported on 01/23/2016   Past Week at Unknown time  . cetirizine (ZYRTEC) 10 MG tablet Take 10 mg by mouth daily. Reported on 01/23/2016   06/25/2016 at Unknown time  . Cholecalciferol (VITAMIN D3) 5000 UNITS CAPS Take 1 capsule by mouth daily. Reported on 01/23/2016   Past Month at Unknown time  . diazepam (VALIUM) 5 MG tablet Take 5 mg by mouth every 12 (twelve) hours as needed for anxiety.   06/26/2016 at Unknown time  . Echin-Gldnseal-Gnsng-RsHp-Zn-C (V-R IMMUNE SUPPORT COMPLEX PO) Take 1 tablet by mouth daily.   06/25/2016 at Unknown time  . estradiol (VIVELLE-DOT) 0.05 MG/24HR patch Place 1 patch (0.05 mg total) onto the skin 2 (two) times a week. 24 patch 4 06/25/2016 at Unknown time  . EVENING PRIMROSE OIL PO Take 1,300 mg by mouth daily.   Past Month at Unknown time  . ibuprofen (ADVIL,MOTRIN) 200 MG tablet Take 400 mg by mouth every 6 (six) hours as needed for moderate pain. Reported on 01/23/2016   Past Month at Unknown time  . Lactobacillus Rhamnosus, GG, (CULTURELLE PO) Take 1 capsule by mouth daily.   06/25/2016 at Unknown time  . losartan-hydrochlorothiazide (HYZAAR) 100-12.5 MG per tablet Take 1 tablet by mouth daily. Reported on 01/23/2016   06/26/2016 at Unknown time  . Multiple Vitamin (MULTIVITAMIN WITH MINERALS) TABS tablet Take 1 tablet by mouth daily. Reported on 01/23/2016   06/25/2016 at Unknown time  . ondansetron (ZOFRAN ODT) 4 MG disintegrating tablet 4mg  ODT q4 hours prn nausea/vomit 10 tablet 0 Taking  . pantoprazole (PROTONIX) 40 MG tablet Take 40 mg by mouth daily. Reported on 01/23/2016   06/26/2016 at Unknown time  . progesterone (PROMETRIUM) 200 MG capsule Take 1 capsule (200 mg total) by mouth daily. (Patient taking differently: Take 200 mg by mouth at bedtime. ) 90 capsule 3 06/25/2016 at Unknown time  . tizanidine (ZANAFLEX) 2 MG capsule Take 4 mg by mouth at bedtime as needed for muscle spasms. Reported on 01/23/2016   06/25/2016 at  Unknown time  . traMADol (ULTRAM) 50 MG tablet Take 50 mg by mouth every 6 (six) hours as needed for severe pain. Reported on 01/23/2016   06/25/2016 at Unknown time  . TURMERIC CURCUMIN PO Take 900 mg by mouth daily. Reported on 01/23/2016   Past Month at Unknown time  . vitamin B-12 (CYANOCOBALAMIN) 1000 MCG tablet Take 1,000 mcg by mouth daily. Reported on 01/23/2016   Past Month at Unknown time  . vitamin E (VITAMIN E) 400 UNIT capsule Take 400 Units by mouth daily. Reported on 01/23/2016   Past Month at Unknown time  . famotidine (PEPCID) 10 MG tablet Take 10 mg by mouth daily as needed for heartburn. Reported on 01/23/2016   More than a month at Unknown time  . HYDROcodone-acetaminophen (NORCO/VICODIN) 5-325 MG  tablet Take 1-2 tablets by mouth every 6 (six) hours as needed for moderate pain or severe pain. (Patient not taking: Reported on 06/12/2016) 12 tablet 0 Not Taking at Unknown time  . meclizine (ANTIVERT) 25 MG tablet Take 25 mg by mouth 3 (three) times daily as needed for dizziness. Reported on 01/23/2016   More than a month at Unknown time  . naproxen (NAPROSYN) 500 MG tablet Take 1 tablet (500 mg total) by mouth 2 (two) times daily. 30 tablet 0 06/23/2016  . oxyCODONE-acetaminophen (PERCOCET/ROXICET) 5-325 MG tablet Take 1-2 tablets by mouth every 6 (six) hours as needed for severe pain. (Patient not taking: Reported on 06/12/2016) 30 tablet 0 Not Taking at Unknown time  . polyethylene glycol (MIRALAX / GLYCOLAX) packet Take 8.5 g by mouth daily as needed for mild constipation. Reported on 01/23/2016   More than a month at Unknown time    Review of Systems  All other systems reviewed and are negative.   Blood pressure 114/87, pulse 69, temperature 97.7 F (36.5 C), temperature source Oral, resp. rate 16, last menstrual period 06/24/2008, SpO2 98 %. Physical Exam  Constitutional: She is oriented to person, place, and time. She appears well-developed and well-nourished.  Cardiovascular: Normal rate  and regular rhythm.   Respiratory: Effort normal and breath sounds normal.  Neurological: She is alert and oriented to person, place, and time.  Skin: Skin is warm and dry.  Psychiatric: She has a normal mood and affect.    No results found for this or any previous visit (from the past 24 hour(s)).  No results found.  Assessment/Plan: 60 yo G3P3 MWF here for TLH/BSO due to recurrent PMP bleeding and uterine fibroids.  H/O polyp resection 5/17.  All questions answered.  Pt here and ready to proceed.  Hale Bogus SUZANNE 06/26/2016, 6:57 AM

## 2016-06-26 NOTE — Anesthesia Procedure Notes (Signed)
Performed by: Lareta Bruneau C       

## 2016-06-26 NOTE — Transfer of Care (Signed)
Immediate Anesthesia Transfer of Care Note  Patient: Melissa Mcclure  Procedure(s) Performed: Procedure(s): HYSTERECTOMY TOTAL LAPAROSCOPIC (Bilateral) CYSTOSCOPY (N/A) SALPINGO OOPHORECTOMY (Bilateral)  Patient Location: PACU  Anesthesia Type:General  Level of Consciousness: sedated and patient cooperative  Airway & Oxygen Therapy: Patient Spontanous Breathing and Patient connected to nasal cannula oxygen  Post-op Assessment: Report given to RN and Post -op Vital signs reviewed and stable  Post vital signs: Reviewed and stable  Last Vitals:  Vitals:   06/26/16 0619  BP: 114/87  Pulse: 69  Resp: 16  Temp: 36.5 C    Last Pain:  Vitals:   06/26/16 0619  TempSrc: Oral      Patients Stated Pain Goal: 3 ([id number])  Complications: No apparent anesthesia complications

## 2016-06-26 NOTE — Anesthesia Procedure Notes (Signed)
Procedure Name: Intubation Date/Time: 06/26/2016 7:31 AM Performed by: Tobin Chad Pre-anesthesia Checklist: Patient identified, Emergency Drugs available, Suction available, Timeout performed and Patient being monitored Patient Re-evaluated:Patient Re-evaluated prior to inductionOxygen Delivery Method: Circle system utilized and Simple face mask Preoxygenation: Pre-oxygenation with 100% oxygen Intubation Type: IV induction Ventilation: Mask ventilation without difficulty Laryngoscope Size: Mac and 3 Grade View: Grade II Tube type: Oral Tube size: 7.0 mm Number of attempts: 1 Airway Equipment and Method: Stylet Placement Confirmation: ETT inserted through vocal cords under direct vision,  positive ETCO2 and breath sounds checked- equal and bilateral Secured at: 21 cm Tube secured with: Tape Dental Injury: Teeth and Oropharynx as per pre-operative assessment

## 2016-06-26 NOTE — Anesthesia Postprocedure Evaluation (Signed)
Anesthesia Post Note  Patient: Melissa Mcclure  Procedure(s) Performed: Procedure(s) (LRB): HYSTERECTOMY TOTAL LAPAROSCOPIC (Bilateral) CYSTOSCOPY (N/A) SALPINGO OOPHORECTOMY (Bilateral)  Patient location during evaluation: PACU Anesthesia Type: General Level of consciousness: awake and alert Pain management: pain level controlled Vital Signs Assessment: post-procedure vital signs reviewed and stable Respiratory status: spontaneous breathing, nonlabored ventilation, respiratory function stable and patient connected to nasal cannula oxygen Cardiovascular status: blood pressure returned to baseline and stable Postop Assessment: no signs of nausea or vomiting Anesthetic complications: no     Last Vitals:  Vitals:   06/26/16 1115 06/26/16 1138  BP: 121/75 122/70  Pulse: 78 79  Resp: 16 18  Temp: 36.9 C 36.4 C    Last Pain:  Vitals:   06/26/16 1138  TempSrc:   PainSc: 1    Pain Goal: Patients Stated Pain Goal: 3 (06/26/16 [id number])               Maloni Musleh A

## 2016-06-26 NOTE — Anesthesia Preprocedure Evaluation (Signed)
Anesthesia Evaluation  Patient identified by MRN, date of birth, ID band Patient awake    Reviewed: Allergy & Precautions, NPO status , Patient's Chart, lab work & pertinent test results  History of Anesthesia Complications (+) PONV  Airway Mallampati: I  TM Distance: >3 FB Neck ROM: Full    Dental  (+) Teeth Intact, Dental Advisory Given   Pulmonary    breath sounds clear to auscultation       Cardiovascular hypertension, Pt. on medications  Rhythm:Regular Rate:Normal     Neuro/Psych    GI/Hepatic GERD  Medicated and Controlled,  Endo/Other    Renal/GU      Musculoskeletal   Abdominal   Peds  Hematology   Anesthesia Other Findings   Reproductive/Obstetrics                             Anesthesia Physical Anesthesia Plan  ASA: II  Anesthesia Plan: General   Post-op Pain Management:    Induction: Intravenous  Airway Management Planned: Oral ETT  Additional Equipment:   Intra-op Plan:   Post-operative Plan: Extubation in OR  Informed Consent: I have reviewed the patients History and Physical, chart, labs and discussed the procedure including the risks, benefits and alternatives for the proposed anesthesia with the patient or authorized representative who has indicated his/her understanding and acceptance.   Dental advisory given  Plan Discussed with: CRNA, Anesthesiologist and Surgeon  Anesthesia Plan Comments:         Anesthesia Quick Evaluation

## 2016-06-26 NOTE — Anesthesia Postprocedure Evaluation (Signed)
Anesthesia Post Note  Patient: DENNELL SATTERWHITE  Procedure(s) Performed: Procedure(s) (LRB): HYSTERECTOMY TOTAL LAPAROSCOPIC (Bilateral) CYSTOSCOPY (N/A) SALPINGO OOPHORECTOMY (Bilateral)  Patient location during evaluation: [facility] Anesthesia Type: General Level of consciousness: awake Pain management: satisfactory to patient Vital Signs Assessment: post-procedure vital signs reviewed and stable Respiratory status: spontaneous breathing Cardiovascular status: stable Anesthetic complications: no     Last Vitals:  Vitals:   06/26/16 1251 06/26/16 1641  BP: 124/65 134/69  Pulse: 74 69  Resp: 17 17  Temp: 36.7 C 37 C    Last Pain:  Vitals:   06/26/16 1641  TempSrc: Oral  PainSc:    Pain Goal: Patients Stated Pain Goal: 0 (06/26/16 1435)               Casimer Lanius

## 2016-06-27 ENCOUNTER — Encounter (HOSPITAL_COMMUNITY): Payer: Self-pay | Admitting: Obstetrics & Gynecology

## 2016-06-27 DIAGNOSIS — N840 Polyp of corpus uteri: Secondary | ICD-10-CM | POA: Diagnosis not present

## 2016-06-27 DIAGNOSIS — N950 Postmenopausal bleeding: Secondary | ICD-10-CM | POA: Diagnosis not present

## 2016-06-27 DIAGNOSIS — Z79890 Hormone replacement therapy (postmenopausal): Secondary | ICD-10-CM | POA: Diagnosis not present

## 2016-06-27 DIAGNOSIS — N800 Endometriosis of uterus: Secondary | ICD-10-CM | POA: Diagnosis not present

## 2016-06-27 DIAGNOSIS — D252 Subserosal leiomyoma of uterus: Secondary | ICD-10-CM | POA: Diagnosis not present

## 2016-06-27 DIAGNOSIS — I10 Essential (primary) hypertension: Secondary | ICD-10-CM | POA: Diagnosis not present

## 2016-06-27 DIAGNOSIS — K219 Gastro-esophageal reflux disease without esophagitis: Secondary | ICD-10-CM | POA: Diagnosis not present

## 2016-06-27 DIAGNOSIS — Z85820 Personal history of malignant melanoma of skin: Secondary | ICD-10-CM | POA: Diagnosis not present

## 2016-06-27 DIAGNOSIS — D251 Intramural leiomyoma of uterus: Secondary | ICD-10-CM | POA: Diagnosis not present

## 2016-06-27 LAB — CBC
HCT: 30.6 % — ABNORMAL LOW (ref 36.0–46.0)
HEMOGLOBIN: 10.6 g/dL — AB (ref 12.0–15.0)
MCH: 31.2 pg (ref 26.0–34.0)
MCHC: 34.6 g/dL (ref 30.0–36.0)
MCV: 90 fL (ref 78.0–100.0)
Platelets: 249 10*3/uL (ref 150–400)
RBC: 3.4 MIL/uL — AB (ref 3.87–5.11)
RDW: 14.3 % (ref 11.5–15.5)
WBC: 9.5 10*3/uL (ref 4.0–10.5)

## 2016-06-27 LAB — BASIC METABOLIC PANEL
ANION GAP: 6 (ref 5–15)
BUN: 24 mg/dL — ABNORMAL HIGH (ref 6–20)
CALCIUM: 8.5 mg/dL — AB (ref 8.9–10.3)
CHLORIDE: 103 mmol/L (ref 101–111)
CO2: 28 mmol/L (ref 22–32)
Creatinine, Ser: 0.89 mg/dL (ref 0.44–1.00)
GFR calc non Af Amer: >60 mL/min (ref >60)
GLUCOSE: 112 mg/dL — AB (ref 65–99)
Potassium: 3.6 mmol/L (ref 3.5–5.1)
Sodium: 137 mmol/L (ref 135–145)

## 2016-06-27 MED ORDER — IBUPROFEN 200 MG PO TABS: 800.0000 mg | ORAL_TABLET | Freq: Four times a day (QID) | ORAL | 0 refills | Status: DC | PRN

## 2016-06-27 NOTE — Discharge Summary (Signed)
Physician Discharge Summary  Patient ID: Melissa Mcclure MRN: [id number] DOB/AGE: [birth year] 62 y.o.  Admit date: 06/26/2016 Discharge date: 06/27/2016  Admission Diagnoses:  Recurrent postmenopausal bleeding, Uterine fibroids, endometrial polyp  Discharge Diagnoses:  Active Problems:   Postmenopausal bleeding   Discharged Condition: good  Hospital Course: Patient admitted through same day surgery.  She was taken to OR where TLH/BSO/cystoscopy were performed.  Surgical findings included mild bladder flap adhesions from prior two Cesarean sections.  Surgery was uneventful.  EBL 40cc.  Foley catheter was removed before leaving OR.  Patient transferred to PACU where she was stable and then to 3rd floor for the remainder of her hospitalization.  During her post-op recovery, her vitals and stable and she was AF.  In evening of POD#0, she was able to transition to oral pain medications and regular diet.  She was able to ambulate and she had good pain control.  She was also able to void on her own.  Patient seen both in the evening of POD#0 and AM of POD#1.  In the AM of POD#1, she was without complaint.  Post op hb was 10.6, decreased from 12.3, pre-operatively.  At this point, patient was voiding, walking, having excellent pain control, had no nausea, and minimal vaginal bleeding.  She was ready for D/C.  Consults: None  Significant Diagnostic Studies: labs: post op hb 10.6  Treatments: surgery: TLH/BSO/cystoscopy  Discharge Exam: Blood pressure (!) 91/55, pulse 65, temperature 98.5 F (36.9 C), temperature source Oral, resp. rate 18, last menstrual period 06/24/2008, SpO2 100 %. General appearance: alert and cooperative Resp: clear to auscultation bilaterally Cardio: regular rate and rhythm, S1, S2 normal, no murmur, click, rub or gallop GI: soft, ND, mod distension, normal BS throughout Extremities: extremities normal, atraumatic, no cyanosis or edema Incision/Wound:c/d/i  Disposition:  01-Home or Self Care     Medication List    STOP taking these medications   HYDROcodone-acetaminophen 5-325 MG tablet Commonly known as:  NORCO/VICODIN   progesterone 200 MG capsule Commonly known as:  PROMETRIUM   traMADol 50 MG tablet Commonly known as:  ULTRAM     TAKE these medications   acetaminophen 500 MG tablet Commonly known as:  TYLENOL Take 1,000 mg by mouth every 6 (six) hours as needed for mild pain. Reported on 01/23/2016   amitriptyline 50 MG tablet Commonly known as:  ELAVIL Take 1 tablet by mouth at bedtime. Reported on 01/23/2016   calcium carbonate 750 MG chewable tablet Commonly known as:  TUMS EX Chew 2 tablets by mouth at bedtime. Reported on 01/23/2016   CALCIUM-MAGNESIUM PO Take 1 tablet by mouth daily. Reported on 01/23/2016   cetirizine 10 MG tablet Commonly known as:  ZYRTEC Take 10 mg by mouth daily. Reported on 01/23/2016   CULTURELLE PO Take 1 capsule by mouth daily.   diazepam 5 MG tablet Commonly known as:  VALIUM Take 5 mg by mouth every 12 (twelve) hours as needed for anxiety.   estradiol 0.05 MG/24HR patch Commonly known as:  VIVELLE-DOT Place 1 patch (0.05 mg total) onto the skin 2 (two) times a week. Notes to patient:  Put on a patch today, Wednesday.  Change it on Sunday and then Thursday.  Then you will be back on your normal Mon/Thurs schedule.   EVENING PRIMROSE OIL PO Take 1,300 mg by mouth daily.   famotidine 10 MG tablet Commonly known as:  PEPCID Take 10 mg by mouth daily as needed for heartburn. Reported on 01/23/2016  ibuprofen 200 MG tablet Commonly known as:  ADVIL,MOTRIN Take 4 tablets (800 mg total) by mouth every 6 (six) hours as needed for moderate pain. Reported on 01/23/2016 What changed:  how much to take   losartan-hydrochlorothiazide 100-12.5 MG tablet Commonly known as:  HYZAAR Take 1 tablet by mouth daily. Reported on 01/23/2016   meclizine 25 MG tablet Commonly known as:  ANTIVERT Take 25 mg by mouth 3  (three) times daily as needed for dizziness. Reported on 01/23/2016   multivitamin with minerals Tabs tablet Take 1 tablet by mouth daily. Reported on 01/23/2016   naproxen 500 MG tablet Commonly known as:  NAPROSYN Take 1 tablet (500 mg total) by mouth 2 (two) times daily.   ondansetron 4 MG disintegrating tablet Commonly known as:  ZOFRAN ODT 4mg  ODT q4 hours prn nausea/vomit   oxyCODONE-acetaminophen 5-325 MG tablet Commonly known as:  PERCOCET/ROXICET Take 1-2 tablets by mouth every 6 (six) hours as needed for severe pain.   pantoprazole 40 MG tablet Commonly known as:  PROTONIX Take 40 mg by mouth daily. Reported on 01/23/2016   polyethylene glycol packet Commonly known as:  MIRALAX / GLYCOLAX Take 8.5 g by mouth daily as needed for mild constipation. Reported on 01/23/2016   tizanidine 2 MG capsule Commonly known as:  ZANAFLEX Take 4 mg by mouth at bedtime as needed for muscle spasms. Reported on 01/23/2016   TURMERIC CURCUMIN PO Take 900 mg by mouth daily. Reported on 01/23/2016   V-R IMMUNE SUPPORT COMPLEX PO Take 1 tablet by mouth daily.   vitamin B-12 1000 MCG tablet Commonly known as:  CYANOCOBALAMIN Take 1,000 mcg by mouth daily. Reported on 01/23/2016   vitamin C with rose hips 1000 MG tablet Take 1,000 mg by mouth daily. Reported on 01/23/2016   Vitamin D3 5000 units Caps Take 1 capsule by mouth daily. Reported on 01/23/2016   vitamin E 400 UNIT capsule Generic drug:  vitamin E Take 400 Units by mouth daily. Reported on 01/23/2016      Follow-up Information    Doy Taaffe, Satira Anis, MD Follow up on 07/02/2016.   Specialty:  Gynecology Why:  appt time 2:30pm Contact information: [address] [place] [address] [phone]           Signed: Lyman Speller 06/27/2016, 8:03 AM

## 2016-06-27 NOTE — Discharge Instructions (Signed)
Post Op Hysterectomy Instructions Please read the instructions below. Refer to these instructions for the next few weeks. These instructions provide you with general information on caring for yourself after surgery. Your caregiver may also give you specific instructions. While your treatment has been planned according to the most current medical practices available, unavoidable problems sometimes happen. If you have any problems or questions after you leave, please call your caregiver.  HOME CARE INSTRUCTIONS Healing will take time. You will have discomfort, tenderness, swelling and bruising at the operative site for a couple of weeks. This is normal and will get better as time goes on.   Only take over-the-counter or prescription medicines for pain, discomfort or fever as directed by your caregiver.   Do not take aspirin. It can cause bleeding.   Do not drive when taking pain medication.   Follow your caregivers advice regarding diet, exercise, lifting, driving and general activities.   Resume your usual diet as directed and allowed.   Get plenty of rest and sleep.   Do not douche, use tampons, or have sexual intercourse until your caregiver gives you permission. .   Take your temperature if you feel hot or flushed.   You may shower today when you get home.  No tub bath for one week.    Do not drink alcohol until you are not taking any narcotic pain medications.   Try to have someone home with you for a week or two to help with the household activities.   Be careful over the next two to three weeks with any activities at home that involve lifting, pushing, or pulling.  Listen to your body--if something feels uncomfortable to do, then don't do it.  Make sure you and your family understands everything about your operation and recovery.   Walking up stairs is fine.  Do not sign any legal documents until you feel normal again.   Do remove the patch behind your ear on Thursday.  Keep  all your follow-up appointments as recommended by your caregiver.   PLEASE CALL THE OFFICE IF:  There is swelling, redness or increasing pain in the wound area.   Pus is coming from the wound.   You notice a bad smell from the wound or surgical dressing.   You have pain, redness and swelling from the intravenous site.   The wound is breaking open (the edges are not staying together).    You develop pain or bleeding when you urinate.   You develop abnormal vaginal discharge.   You have any type of abnormal reaction or develop an allergy to your medication.   You need stronger pain medication for your pain   SEEK IMMEDIATE MEDICAL CARE:  You develop a temperature of 100.5 or higher.   You develop abdominal pain.   You develop chest pain.   You develop shortness of breath.   You pass out.   You develop pain, swelling or redness of your leg.   You develop heavy vaginal bleeding with or without blood clots.   MEDICATIONS:  Restart your regular medications BUT wait one week before restarting all vitamins and mineral supplements  Use Motrin 800mg  every 8 hours for the next several days.  This will help you use less Percocet.  Use the Percocet 5/325 1-2 tabs every 4-6 hours as needed for pain.  DO NOT TAKE ULTRAM WITH THE PERCOCET.  You may use an over the counter stool softener like Colace or Dulcolax to help with starting  a bowel movement.  Start the day after you go home.  Warm liquids, fluids, and ambulation help too.  If you have not had a bowel movement in four days, you need to call the office.

## 2016-06-27 NOTE — Progress Notes (Signed)
Patient discharged to home.  BP upon discharge was 103/67.  All questions answered, patient given instructions for future appointments. Pain scale zero.

## 2016-06-27 NOTE — Progress Notes (Signed)
1 Day Post-Op Procedure(s) (LRB): HYSTERECTOMY TOTAL LAPAROSCOPIC (Bilateral) CYSTOSCOPY (N/A) SALPINGO OOPHORECTOMY (Bilateral)  Subjective: Patient reports good pain control and no nausea.  She has passed flatus.  She is ordering breakfast.  Having no trouble voiding and walking in the halls.  Ready to go home.  Didn't sleep well.  Objective: I have reviewed patient's vital signs, intake and output, medications and labs. Vitals:   06/27/16 0200 06/27/16 0602  BP: (!) 96/48 (!) 91/55  Pulse: 73 65  Resp: 17 18  Temp: 98.6 F (37 C) 98.5 F (36.9 C)   Post op hb: 10.6  General: alert and cooperative Resp: clear to auscultation bilaterally Cardio: regular rate and rhythm, S1, S2 normal, no murmur, click, rub or gallop GI: normal findings: soft, non tender, mod distension, normal BS and incision: clean, dry and intact Extremities: extremities normal, atraumatic, no cyanosis or edema Vaginal Bleeding: none  Assessment: s/p Procedure(s): HYSTERECTOMY TOTAL LAPAROSCOPIC (Bilateral) CYSTOSCOPY (N/A) SALPINGO OOPHORECTOMY (Bilateral): progressing well  Plan: Discharge home  LOS: 0 days    Hale Bogus SUZANNE 06/27/2016, 7:53 AM

## 2016-07-02 ENCOUNTER — Ambulatory Visit (INDEPENDENT_AMBULATORY_CARE_PROVIDER_SITE_OTHER): Payer: BLUE CROSS/BLUE SHIELD | Admitting: Obstetrics & Gynecology

## 2016-07-02 ENCOUNTER — Encounter: Payer: Self-pay | Admitting: Obstetrics & Gynecology

## 2016-07-02 VITALS — BP 120/78 | HR 80 | Resp 16 | Ht 63.0 in | Wt 140.0 lb

## 2016-07-02 DIAGNOSIS — Z9889 Other specified postprocedural states: Secondary | ICD-10-CM

## 2016-07-02 NOTE — Progress Notes (Signed)
Post Operative Visit  Procedure:HYSTERECTOMY TOTAL LAPAROSCOPIC (Bilateral Abdomen); CYSTOSCOPY (N/A Bladder); SALPINGO OOPHORECTOMY (Bilateral Abdomen)  Days Post-op: 6 days  Subjective: Doing well.  Having regular bowel movements.  Emptying bladder without issues.  No vaginal bleeding.  Energy is ok.  She reports she took the oxycodone for a few nights and then tylenol.  Hasn't any narcotic for three days.    Mediation has been decided for Oct 25th.  This is in regards to her back.  She has not had any spasms since surgery.  Really, she is pleased about this.    Objective: BP 120/78 (BP Location: Right Arm, Patient Position: Sitting, Cuff Size: Normal)   Pulse 80   Resp 16   Ht 5\' 3"  (1.6 m)   Wt 140 lb (63.5 kg)   LMP 06/24/2008   BMI 24.80 kg/m   EXAM General: alert and cooperative Resp: clear to auscultation bilaterally Cardio: regular rate and rhythm, S1, S2 normal, no murmur, click, rub or gallop GI: soft, non-tender; bowel sounds normal; no masses,  no organomegaly and incision: clean, dry and intact Extremities: extremities normal, atraumatic, no cyanosis or edema Vaginal Bleeding: none  Assessment: s/p TLH/BSO/cystoscopy  Plan: Recheck 3 weeks

## 2016-07-03 ENCOUNTER — Ambulatory Visit: Payer: BLUE CROSS/BLUE SHIELD | Admitting: Obstetrics & Gynecology

## 2016-07-04 ENCOUNTER — Telehealth: Payer: Self-pay | Admitting: Obstetrics & Gynecology

## 2016-07-04 NOTE — Telephone Encounter (Signed)
Spoke with patient. Patient had a TLH, cystoscopy, salpingo oophorectomy on 06/26/2016. Patient was seen on 07/02/2016 for a post-op appointment with Dr.Miller and reports doing well at that visit. On 07/03/2016 she began to have a red itchy rash just above her umbilicus. Reports this is right above the glue for her incision. Reports her incisions are healing well. Denies any redness, swelling, or exudate. States she is not having irritation or itching where the glue is. Itching has increased over the last 24 hours. Patient applied alcohol to the area as she is at her [place] out of town and does not have any other products to use. Advised alcohol will dry the area out and likely increase itching. Advised I will review with covering provider as Dr.Miller is out of the office today and return call. Patient is agreeable.  Routing to [place] for review and advise.

## 2016-07-04 NOTE — Telephone Encounter (Signed)
Patient had a hysterectomy 06/26/16. She called and said, "I sat down awhile ago and noticed some blood on my shirt. I found I am bleeding out of my belly button."

## 2016-07-04 NOTE — Telephone Encounter (Signed)
Spoke with patient. Advised I have reviewed her symptoms with Dr.Jertson. Dr.Jertson foes not feel this is related to her surgical incision or glue due to location of the rash. Advised Dr.Jertson recommends that she apply OTC hydrocortisone cream to the area as needed. Advised she may also take OTC oral benadryl for itching if needed. Advised if her symptoms persist she will need to seek further evaluation. Patient is agreeable.  Cc; Dr.Miller  Routing to provider for final review. Patient agreeable to disposition. Will close encounter.

## 2016-07-04 NOTE — Telephone Encounter (Signed)
Patient had surgery on 06/26/16 and is having itching around her belly button. She thinks it may be from the glue that was used.

## 2016-07-05 ENCOUNTER — Ambulatory Visit (INDEPENDENT_AMBULATORY_CARE_PROVIDER_SITE_OTHER): Payer: BLUE CROSS/BLUE SHIELD | Admitting: Obstetrics and Gynecology

## 2016-07-05 ENCOUNTER — Encounter: Payer: Self-pay | Admitting: Obstetrics and Gynecology

## 2016-07-05 VITALS — BP 118/70 | HR 84 | Resp 14 | Wt 141.0 lb

## 2016-07-05 DIAGNOSIS — L0390 Cellulitis, unspecified: Secondary | ICD-10-CM

## 2016-07-05 MED ORDER — NYSTATIN 100000 UNIT/GM EX CREA: 1.0000 "application " | TOPICAL_CREAM | Freq: Two times a day (BID) | CUTANEOUS | 0 refills | Status: DC

## 2016-07-05 MED ORDER — DOXYCYCLINE HYCLATE 100 MG PO CAPS: 100.0000 mg | ORAL_CAPSULE | Freq: Two times a day (BID) | ORAL | 0 refills | Status: DC

## 2016-07-05 MED ORDER — AMOXICILLIN 500 MG PO CAPS: 500.0000 mg | ORAL_CAPSULE | Freq: Two times a day (BID) | ORAL | 0 refills | Status: DC

## 2016-07-05 NOTE — Progress Notes (Signed)
GYNECOLOGY  VISIT   HPI: 60 y.o.   Married  Caucasian  female   G3P3 with Patient's last menstrual period was 06/24/2008.   Here 10 days s/p TLH c/o bleeding from umbilicus incision. She has had some drainage from her umbilical incision starting yesterday, the leakage is small. She has noticed some erythema around her umbilicus, itchy, started to extend under her umbilicus last night. No fevers, no chills. Otherwise feels okay. Some slight lower back discomfort.   The area of erythema is extremely itchy.  Normal bowel and bladder function, no abdominal pain.  GYNECOLOGIC HISTORY: Patient's last menstrual period was 06/24/2008. Contraception:hysterectomy  Menopausal hormone therapy: none         OB History    Gravida Para Term Preterm AB Living   3 3       3    SAB TAB Ectopic Multiple Live Births                     Patient Active Problem List   Diagnosis Date Noted  . Postmenopausal bleeding 06/26/2016  . Hiatal hernia 12/30/2015  . Malignant melanoma of lower leg ([place]) 03/10/2015  . Melanoma of right upper arm ([place]) 10/12/2013  . GERD (gastroesophageal reflux disease) 10/12/2013  . Essential hypertension, benign 10/12/2013    Past Medical History:  Diagnosis Date  . Acid reflux    GERD  . Cancer ([place]) 08,11   melanoma-left knee, right arm  . Chronic back pain   . Complication of anesthesia   . Family history of adverse reaction to anesthesia    mother has PONV  . GERD (gastroesophageal reflux disease)   . H/O hiatal hernia   . Heart murmur    no problems  . Hypertension   . PONV (postoperative nausea and vomiting)    "zofran works great  . Seasonal allergies   . SVD (spontaneous vaginal delivery)    x 1  . Vertigo     Past Surgical History:  Procedure Laterality Date  . ANTERIOR LAT LUMBAR FUSION Right 04/17/2013   Procedure: ANTERIOR LATERAL LUMBAR FUSION 3 LEVELS;  Surgeon: Erline Levine, MD;  Location: [facility];  Service: Neurosurgery;  Laterality:  Right;  Right Lumbar Two-Three Lumbar Three-Four Lumbar Four-Five  Anterolateral Fusion  . [place]  . CESAREAN SECTION  1980  . COLONOSCOPY    . CYSTOSCOPY N/A 06/26/2016   Procedure: CYSTOSCOPY;  Surgeon: Megan Salon, MD;  Location: [facility];  Service: Gynecology;  Laterality: N/A;  . DILATATION & CURETTAGE/HYSTEROSCOPY WITH MYOSURE N/A 01/24/2016   Procedure: DILATATION & CURETTAGE/HYSTEROSCOPY WITH MYOSURE;  Surgeon: Megan Salon, MD;  Location: [facility];  Service: Gynecology;  Laterality: N/A;  . FOOT SURGERY Right    early 20s-bone spur removal  . LAPAROSCOPIC HYSTERECTOMY Bilateral 06/26/2016   Procedure: HYSTERECTOMY TOTAL LAPAROSCOPIC;  Surgeon: Megan Salon, MD;  Location: [facility];  Service: Gynecology;  Laterality: Bilateral;  . LUMBAR PERCUTANEOUS PEDICLE SCREW 3 LEVEL N/A 04/17/2013   Procedure: LUMBAR PERCUTANEOUS PEDICLE SCREW 3 LEVEL;  Surgeon: Erline Levine, MD;  Location: [facility];  Service: Neurosurgery;  Laterality: N/A;  Decompression and Posterior Lumbar Interbody Fusion of Lumbar Five-Sacral One, Percutaneous Screws Lumbar Two through Sacral One  . MELANOMA EXCISION     knee 08 lft. elbow 11rt  . SALPINGOOPHORECTOMY Bilateral 06/26/2016   Procedure: SALPINGO OOPHORECTOMY;  Surgeon: Megan Salon, MD;  Location: [facility];  Service: Gynecology;  Laterality: Bilateral;  .  SHOULDER SURGERY Right 04/28/2015  . TUBAL LIGATION  1986  . WLE  06/17/07   and left groin lymph node    Current Outpatient Prescriptions  Medication Sig Dispense Refill  . acetaminophen (TYLENOL) 500 MG tablet Take 1,000 mg by mouth every 6 (six) hours as needed for mild pain. Reported on 01/23/2016    . amitriptyline (ELAVIL) 50 MG tablet Take 1 tablet by mouth at bedtime. Reported on 01/23/2016    . Ascorbic Acid (VITAMIN C WITH ROSE HIPS) 1000 MG tablet Take 1,000 mg by mouth daily. Reported on 01/23/2016    . calcium carbonate (TUMS EX) 750 MG chewable tablet Chew 2 tablets by mouth at bedtime.  Reported on 01/23/2016    . CALCIUM-MAGNESIUM PO Take 1 tablet by mouth daily. Reported on 01/23/2016    . cetirizine (ZYRTEC) 10 MG tablet Take 10 mg by mouth daily. Reported on 01/23/2016    . Cholecalciferol (VITAMIN D3) 5000 UNITS CAPS Take 1 capsule by mouth daily. Reported on 01/23/2016    . diazepam (VALIUM) 5 MG tablet Take 5 mg by mouth every 12 (twelve) hours as needed for anxiety.    . Echin-Gldnseal-Gnsng-RsHp-Zn-C (V-R IMMUNE SUPPORT COMPLEX PO) Take 1 tablet by mouth daily.    Marland Kitchen estradiol (VIVELLE-DOT) 0.05 MG/24HR patch Place 1 patch (0.05 mg total) onto the skin 2 (two) times a week. 24 patch 4  . EVENING PRIMROSE OIL PO Take 1,300 mg by mouth daily.    . famotidine (PEPCID) 10 MG tablet Take 10 mg by mouth daily as needed for heartburn. Reported on 01/23/2016    . ibuprofen (ADVIL,MOTRIN) 200 MG tablet Take 4 tablets (800 mg total) by mouth every 6 (six) hours as needed for moderate pain. Reported on 01/23/2016 30 tablet 0  . Lactobacillus Rhamnosus, GG, (CULTURELLE PO) Take 1 capsule by mouth daily.    Marland Kitchen losartan-hydrochlorothiazide (HYZAAR) 100-12.5 MG per tablet Take 1 tablet by mouth daily. Reported on 01/23/2016    . meclizine (ANTIVERT) 25 MG tablet Take 25 mg by mouth 3 (three) times daily as needed for dizziness. Reported on 01/23/2016    . Multiple Vitamin (MULTIVITAMIN WITH MINERALS) TABS tablet Take 1 tablet by mouth daily. Reported on 01/23/2016    . naproxen (NAPROSYN) 500 MG tablet Take 1 tablet (500 mg total) by mouth 2 (two) times daily. 30 tablet 0  . ondansetron (ZOFRAN ODT) 4 MG disintegrating tablet 4mg  ODT q4 hours prn nausea/vomit 10 tablet 0  . oxyCODONE-acetaminophen (PERCOCET/ROXICET) 5-325 MG tablet Take 1-2 tablets by mouth every 6 (six) hours as needed for severe pain. 30 tablet 0  . pantoprazole (PROTONIX) 40 MG tablet Take 40 mg by mouth daily. Reported on 01/23/2016    . polyethylene glycol (MIRALAX / GLYCOLAX) packet Take 8.5 g by mouth daily as needed for mild  constipation. Reported on 01/23/2016    . tizanidine (ZANAFLEX) 2 MG capsule Take 4 mg by mouth at bedtime as needed for muscle spasms. Reported on 01/23/2016    . TURMERIC CURCUMIN PO Take 900 mg by mouth daily. Reported on 01/23/2016    . vitamin B-12 (CYANOCOBALAMIN) 1000 MCG tablet Take 1,000 mcg by mouth daily. Reported on 01/23/2016    . vitamin E (VITAMIN E) 400 UNIT capsule Take 400 Units by mouth daily. Reported on 01/23/2016     No current facility-administered medications for this visit.      ALLERGIES: Sulfur  Family History  Problem Relation Age of Onset  . Diabetes Father  borderline  . Melanoma Father   . Congestive Heart Failure Father   . Colon cancer Mother   . Hypertension Mother   . Osteoporosis Mother   . Cancer Maternal Grandmother     "female"  . Heart disease Paternal Grandfather     Social History   Social History  . Marital status: Married    Spouse name: N/A  . Number of children: N/A  . Years of education: N/A   Occupational History  . Not on file.   Social History Main Topics  . Smoking status: Never Smoker  . Smokeless tobacco: Never Used  . Alcohol use 0.0 oz/week     Comment: ocassional wine  . Drug use: No  . Sexual activity: Yes    Partners: Male    Birth control/ protection: Surgical, Post-menopausal     Comment: BTL   Other Topics Concern  . Not on file   Social History Narrative  . No narrative on file    Review of Systems  Constitutional: Negative.   HENT: Negative.   Eyes: Negative.   Respiratory: Negative.   Cardiovascular: Negative.   Gastrointestinal: Negative.   Genitourinary:       Umbilicus bleeding and itching   Musculoskeletal: Negative.   Skin: Negative.   Neurological: Negative.   Endo/Heme/Allergies: Negative.   Psychiatric/Behavioral: Negative.     PHYSICAL EXAMINATION:    BP 118/70 (BP Location: Right Arm, Patient Position: Sitting, Cuff Size: Normal)   Pulse 84   Resp 14   Wt 141 lb (64 kg)   LMP  06/24/2008   BMI 24.98 kg/m     General appearance: alert, cooperative and appears stated age Abdomen: soft, non-tender; bowel sounds normal; no masses,  no organomegaly Umbilical incision: erythema surrounding her umbilicus, slightly warm to touch, above her umbilicus the erythema is slightly patchy (like candida). The area of erythema was outlined. Slight induration above the umbilicus  Only. The dermabond was removed from her umbilicus and the incision was probed and opened easily with a sterile cotton swab with saline on it. No drainage noted. Fascia intact.  Other incisions without erythema, healing well.     ASSESSMENT 10 days s/p TLH presents with cellulitis around her umbilical incision. Dermaond removed, incision easily opened with a cotton swab, no drainage noted  Area of erythema marked  PLAN Will treat with amoxicillin and doxycyline Nystatin for possible yeast (extremely itchy, the superior portion looks slightly yeasty) F/U tomorrow with Dr Quincy Simmonds Call with fevers, malaise or any other concerns   An After Visit Summary was printed and given to the patient.  CC: Dr Sabra Heck and Dr Quincy Simmonds

## 2016-07-05 NOTE — Telephone Encounter (Signed)
Patient had a TLH, cystoscopy and salpingo oophorectomy on 06/26/2016. Spoke with patient. Patient states that yesterday afternoon she noticed some blood on her dress. Upon assessment she found that she was having bleeding from her umbilicus. Reports blood tinged drainage has persisted through the night and into this morning. When she took a shower this morning her umbilicus burned. Umbilicus is very itchy, red, and swollen. Reports redness has now extended close to her incision. Area is tender to the touch and with movement. Denies any fevers or chills. Patient has been applying hydrocortisone cream as needed to the area to reduce itching as recommended yesterday (please see additional telephone encounter dated 07/04/2016). Advised she will need to be seen in the office for further evaluation. Patient is agreeable. Aware Dr.Miller is out of the office today. Appointment scheduled for today 07/05/2016 at 1:45 pm with Dr.Jertson. She is agreeable to date and time and to see another provider.  Cc: Dr.Miller  Routing to covering provider for final review. Patient agreeable to disposition. Will close encounter.

## 2016-07-05 NOTE — Patient Instructions (Signed)

## 2016-07-06 ENCOUNTER — Encounter: Payer: Self-pay | Admitting: Obstetrics and Gynecology

## 2016-07-06 ENCOUNTER — Ambulatory Visit (INDEPENDENT_AMBULATORY_CARE_PROVIDER_SITE_OTHER): Payer: BLUE CROSS/BLUE SHIELD | Admitting: Obstetrics and Gynecology

## 2016-07-06 VITALS — BP 110/74 | HR 72 | Temp 98.4°F | Ht 63.0 in | Wt 142.0 lb

## 2016-07-06 DIAGNOSIS — L0390 Cellulitis, unspecified: Secondary | ICD-10-CM | POA: Diagnosis not present

## 2016-07-06 NOTE — Progress Notes (Signed)
Patient ID: Melissa Mcclure, female   DOB: 10/16/1955, 60 y.o.   MRN: [id number]  . GYNECOLOGY  VISIT   HPI: 60 y.o.   Married  Caucasian  female   G3P3 with Patient's last menstrual period was 06/24/2008.   here for wound cellulitis on abdomen following laparoscopic TLH for recurrent postmenopausal bleeding.   Started having supraumbilical skin itching 2 days ago. Developed redness and the skin and drainage.   Seen yesterday by Dr. Talbert Nan and started on doxycycline and amoxicillin last night and this morning.  Also given cream for Nystatin cream and used it once.   Still having itching. Feels like the itching in the right lower lower abdomen.   No known fevers. ? Chills.  Fels sluggish yesterday.  Eating well.  Having BMs.  Urinating well.  No nausea or vomiting.  Took Tylenol this am.  Has not taken Tramadol for several nights.    GYNECOLOGIC HISTORY: Patient's last menstrual period was 06/24/2008. Contraception:  postmenopausal Menopausal hormone therapy:  Vivelle-Dot Last mammogram:  10/24/15, Density Category C, Bi-Rads 1:  Negative Last pap smear:   12/30/15, Negative        OB History    Gravida Para Term Preterm AB Living   3 3       3    SAB TAB Ectopic Multiple Live Births                     Patient Active Problem List   Diagnosis Date Noted  . Postmenopausal bleeding 06/26/2016  . Hiatal hernia 12/30/2015  . Malignant melanoma of lower leg ([place]) 03/10/2015  . Melanoma of right upper arm ([place]) 10/12/2013  . GERD (gastroesophageal reflux disease) 10/12/2013  . Essential hypertension, benign 10/12/2013    Past Medical History:  Diagnosis Date  . Acid reflux    GERD  . Cancer ([place]) 08,11   melanoma-left knee, right arm  . Chronic back pain   . Complication of anesthesia   . Family history of adverse reaction to anesthesia    mother has PONV  . GERD (gastroesophageal reflux disease)   . H/O hiatal hernia   . Heart murmur    no problems  .  Hypertension   . PONV (postoperative nausea and vomiting)    "zofran works great  . Seasonal allergies   . SVD (spontaneous vaginal delivery)    x 1  . Vertigo     Past Surgical History:  Procedure Laterality Date  . ABDOMINAL HYSTERECTOMY    . ANTERIOR LAT LUMBAR FUSION Right 04/17/2013   Procedure: ANTERIOR LATERAL LUMBAR FUSION 3 LEVELS;  Surgeon: Erline Levine, MD;  Location: [facility];  Service: Neurosurgery;  Laterality: Right;  Right Lumbar Two-Three Lumbar Three-Four Lumbar Four-Five  Anterolateral Fusion  . [place]  . CESAREAN SECTION  1980  . COLONOSCOPY    . CYSTOSCOPY N/A 06/26/2016   Procedure: CYSTOSCOPY;  Surgeon: Megan Salon, MD;  Location: [facility];  Service: Gynecology;  Laterality: N/A;  . DILATATION & CURETTAGE/HYSTEROSCOPY WITH MYOSURE N/A 01/24/2016   Procedure: DILATATION & CURETTAGE/HYSTEROSCOPY WITH MYOSURE;  Surgeon: Megan Salon, MD;  Location: [facility];  Service: Gynecology;  Laterality: N/A;  . FOOT SURGERY Right    early 20s-bone spur removal  . LAPAROSCOPIC HYSTERECTOMY Bilateral 06/26/2016   Procedure: HYSTERECTOMY TOTAL LAPAROSCOPIC;  Surgeon: Megan Salon, MD;  Location: [facility];  Service: Gynecology;  Laterality: Bilateral;  . LUMBAR PERCUTANEOUS PEDICLE SCREW 3 LEVEL N/A  04/17/2013   Procedure: LUMBAR PERCUTANEOUS PEDICLE SCREW 3 LEVEL;  Surgeon: Erline Levine, MD;  Location: [facility];  Service: Neurosurgery;  Laterality: N/A;  Decompression and Posterior Lumbar Interbody Fusion of Lumbar Five-Sacral One, Percutaneous Screws Lumbar Two through Sacral One  . MELANOMA EXCISION     knee 08 lft. elbow 11rt  . SALPINGOOPHORECTOMY Bilateral 06/26/2016   Procedure: SALPINGO OOPHORECTOMY;  Surgeon: Megan Salon, MD;  Location: [facility];  Service: Gynecology;  Laterality: Bilateral;  . SHOULDER SURGERY Right 04/28/2015  . TUBAL LIGATION  1986  . WLE  06/17/07   and left groin lymph node    Current Outpatient Prescriptions  Medication Sig  Dispense Refill  . acetaminophen (TYLENOL) 500 MG tablet Take 1,000 mg by mouth every 6 (six) hours as needed for mild pain. Reported on 01/23/2016    . amitriptyline (ELAVIL) 50 MG tablet Take 1 tablet by mouth at bedtime. Reported on 01/23/2016    . amoxicillin (AMOXIL) 500 MG capsule Take 1 capsule (500 mg total) by mouth 2 (two) times daily. One capsule TID for 7 Days 14 capsule 0  . Ascorbic Acid (VITAMIN C WITH ROSE HIPS) 1000 MG tablet Take 1,000 mg by mouth daily. Reported on 01/23/2016    . calcium carbonate (TUMS EX) 750 MG chewable tablet Chew 2 tablets by mouth at bedtime. Reported on 01/23/2016    . CALCIUM-MAGNESIUM PO Take 1 tablet by mouth daily. Reported on 01/23/2016    . cetirizine (ZYRTEC) 10 MG tablet Take 10 mg by mouth daily. Reported on 01/23/2016    . Cholecalciferol (VITAMIN D3) 5000 UNITS CAPS Take 1 capsule by mouth daily. Reported on 01/23/2016    . diazepam (VALIUM) 5 MG tablet Take 5 mg by mouth every 12 (twelve) hours as needed for anxiety.    Marland Kitchen doxycycline (VIBRAMYCIN) 100 MG capsule Take 1 capsule (100 mg total) by mouth 2 (two) times daily. Take BID for 14 days.  Take with food as can cause GI distress. 14 capsule 0  . Echin-Gldnseal-Gnsng-RsHp-Zn-C (V-R IMMUNE SUPPORT COMPLEX PO) Take 1 tablet by mouth daily.    Marland Kitchen estradiol (VIVELLE-DOT) 0.05 MG/24HR patch Place 1 patch (0.05 mg total) onto the skin 2 (two) times a week. 24 patch 4  . EVENING PRIMROSE OIL PO Take 1,300 mg by mouth daily.    . famotidine (PEPCID) 10 MG tablet Take 10 mg by mouth daily as needed for heartburn. Reported on 01/23/2016    . ibuprofen (ADVIL,MOTRIN) 200 MG tablet Take 4 tablets (800 mg total) by mouth every 6 (six) hours as needed for moderate pain. Reported on 01/23/2016 30 tablet 0  . Lactobacillus Rhamnosus, GG, (CULTURELLE PO) Take 1 capsule by mouth daily.    Marland Kitchen losartan-hydrochlorothiazide (HYZAAR) 100-12.5 MG per tablet Take 1 tablet by mouth daily. Reported on 01/23/2016    . meclizine (ANTIVERT)  25 MG tablet Take 25 mg by mouth 3 (three) times daily as needed for dizziness. Reported on 01/23/2016    . Multiple Vitamin (MULTIVITAMIN WITH MINERALS) TABS tablet Take 1 tablet by mouth daily. Reported on 01/23/2016    . naproxen (NAPROSYN) 500 MG tablet Take 1 tablet (500 mg total) by mouth 2 (two) times daily. 30 tablet 0  . nystatin cream (MYCOSTATIN) Apply 1 application topically 2 (two) times daily. Apply to affected area BID for up to 7 days. 30 g 0  . ondansetron (ZOFRAN ODT) 4 MG disintegrating tablet 4mg  ODT q4 hours prn nausea/vomit 10 tablet 0  .  oxyCODONE-acetaminophen (PERCOCET/ROXICET) 5-325 MG tablet Take 1-2 tablets by mouth every 6 (six) hours as needed for severe pain. 30 tablet 0  . pantoprazole (PROTONIX) 40 MG tablet Take 40 mg by mouth daily. Reported on 01/23/2016    . polyethylene glycol (MIRALAX / GLYCOLAX) packet Take 8.5 g by mouth daily as needed for mild constipation. Reported on 01/23/2016    . tizanidine (ZANAFLEX) 2 MG capsule Take 4 mg by mouth at bedtime as needed for muscle spasms. Reported on 01/23/2016    . TURMERIC CURCUMIN PO Take 900 mg by mouth daily. Reported on 01/23/2016    . vitamin B-12 (CYANOCOBALAMIN) 1000 MCG tablet Take 1,000 mcg by mouth daily. Reported on 01/23/2016    . vitamin E (VITAMIN E) 400 UNIT capsule Take 400 Units by mouth daily. Reported on 01/23/2016     No current facility-administered medications for this visit.      ALLERGIES: Sulfur  Family History  Problem Relation Age of Onset  . Diabetes Father     borderline  . Melanoma Father   . Congestive Heart Failure Father   . Colon cancer Mother   . Hypertension Mother   . Osteoporosis Mother   . Cancer Maternal Grandmother     "female"  . Heart disease Paternal Grandfather     Social History   Social History  . Marital status: Married    Spouse name: N/A  . Number of children: N/A  . Years of education: N/A   Occupational History  . Not on file.   Social History Main Topics   . Smoking status: Never Smoker  . Smokeless tobacco: Never Used  . Alcohol use 0.0 oz/week     Comment: ocassional wine  . Drug use: No  . Sexual activity: Yes    Partners: Male    Birth control/ protection: Surgical, Post-menopausal     Comment: BTL   Other Topics Concern  . Not on file   Social History Narrative  . No narrative on file    ROS:  Pertinent items are noted in HPI.  PHYSICAL EXAMINATION:    BP 110/74 (BP Location: Right Arm, Patient Position: Sitting, Cuff Size: Normal)   Pulse 72   Temp 98.4 F (36.9 C) (Oral) Comment: took Tylenol at 8:45am  Ht 5\' 3"  (1.6 m)   Wt 142 lb (64.4 kg)   LMP 06/24/2008   BMI 25.15 kg/m     General appearance: alert, cooperative and appears stated age.  Looks good.   Abdomen: soft, non-tender, no masses,  no organomegaly.  Umbilical dressing removed.  Pen line noted and erythema contained within this area.  Umbilicus without Dermabond. Firmness 1.5 cm subtle area along superior umbilicus, nontender.  Incision with minimal apot of blood, not pus. Cleansed with H2O2, surrounding area treated with Nystatin cream, and redressed with Sterile gauze and paper tape.   Remaining incisions intact with Dermabond.  Subtle redness from outline of tape from dressing.   Chaperone was present for exam.  ASSESSMENT  Cellulitis.   PLAN  Continue Doxycycline and Amoxicillin.  Ok for Nystatin but not incision. Wound culture was not performed. Follow up for recheck in 3 days with Dr. Sabra Heck.   Call for fever, nausea and vomiting, increased swelling or pain in umbilical incision area.   An After Visit Summary was printed and given to the patient.  ___15___ minutes face to face time of which over 50% was spent in counseling.

## 2016-07-09 ENCOUNTER — Ambulatory Visit (INDEPENDENT_AMBULATORY_CARE_PROVIDER_SITE_OTHER): Payer: BLUE CROSS/BLUE SHIELD | Admitting: Obstetrics & Gynecology

## 2016-07-09 ENCOUNTER — Telehealth: Payer: Self-pay | Admitting: *Deleted

## 2016-07-09 ENCOUNTER — Encounter: Payer: Self-pay | Admitting: Obstetrics & Gynecology

## 2016-07-09 VITALS — BP 118/82 | HR 86 | Resp 12 | Ht 63.0 in | Wt 143.6 lb

## 2016-07-09 DIAGNOSIS — Z9889 Other specified postprocedural states: Secondary | ICD-10-CM

## 2016-07-09 NOTE — Telephone Encounter (Signed)
Call to patient. Per Dr. Sabra Heck, patient is to change the gauze bandage until drainage stops. Patient is okay to use just a band aid if she prefers. Patient verbalized understanding and appreciative of phone call.   Routing to provider for final review. Patient agreeable to disposition. Will close encounter.

## 2016-07-11 NOTE — Progress Notes (Signed)
Post Operative Visit  Procedure: TLH/BSO/cystoscopy on 10/3 Days Post-op: 9   Subjective: Pt seen end of last week due to skin cellulitis.  She was started on antibiotics.  Reports no fevers and that skin itching and discomfort have fully resolved.  Denies pain.  Taking nothing for pain.  Reports back is doing pretty well.  Will get back into home from renting it for furniture market on Wednesday.  So glad about that.  Denies vaginal bleeding or discharge.  Objective: BP 118/82 (BP Location: Right Arm, Patient Position: Sitting, Cuff Size: Normal)   Pulse 86   Resp 12   Ht 5\' 3"  (1.6 m)   Wt 143 lb 9.6 oz (65.1 kg)   LMP 06/24/2008   BMI 25.44 kg/m   EXAM General: alert , NAD Resp: clear to auscultation bilaterally Cardio: regular rate and rhythm, S1, S2 normal, no murmur, click, rub or gallop GI: soft, non-tender; bowel sounds normal; no masses,  no organomegaly and incision: clean, dry, intact and no surrounding erythema noted Extremities: extremities normal, atraumatic, no cyanosis or edema Vaginal Bleeding: none  Assessment: s/p TLH/BSO/cystoscopy with skin cellulitis, much improved  Plan: Recheck 2 weeks Pt will finish antibiotics and knows to call with any change in symptoms.

## 2016-07-24 ENCOUNTER — Encounter: Payer: Self-pay | Admitting: Obstetrics & Gynecology

## 2016-07-24 ENCOUNTER — Ambulatory Visit (INDEPENDENT_AMBULATORY_CARE_PROVIDER_SITE_OTHER): Payer: BLUE CROSS/BLUE SHIELD | Admitting: Obstetrics & Gynecology

## 2016-07-24 VITALS — BP 118/70 | HR 84 | Resp 12 | Ht 63.0 in | Wt 143.8 lb

## 2016-07-24 DIAGNOSIS — Z9889 Other specified postprocedural states: Secondary | ICD-10-CM

## 2016-07-24 NOTE — Progress Notes (Signed)
Post Operative Visit  Procedure:HYSTERECTOMY TOTAL LAPAROSCOPIC, CYSTOSCOPY, SALPINGO OOPHORECTOMY (Bilateral Abdomen)  Days Post-op: 28 days   Subjective: Doing well.  Mediation from back issues is finally completed.  Denies vaginal bleeding except for a little spotting yesterday.  Hasn't had any today.  Denies pain (except for back issues).  BMs are normal.  Bladder function is normal.    Objective: BP 118/70 (BP Location: Right Arm, Patient Position: Sitting, Cuff Size: Normal)   Pulse 84   Resp 12   Ht 5\' 3"  (1.6 m)   Wt 143 lb 12.8 oz (65.2 kg)   LMP 06/24/2008   BMI 25.47 kg/m   EXAM General: alert and cooperative Resp: clear to auscultation bilaterally Cardio: regular rate and rhythm GI: soft, non-tender; bowel sounds normal; no masses,  no organomegaly and incision: clean, dry and intact Extremities: extremities normal, atraumatic, no cyanosis or edema Vaginal Bleeding: none  Gyn:    Assessment: s/p TLH, BSO, cystoscopy due to recurrent PMP bleeding.  Final pathology showed fibroids and adenomyosis  Plan: Plan to follow up for AEX as scheduled

## 2016-08-07 DIAGNOSIS — Z23 Encounter for immunization: Secondary | ICD-10-CM | POA: Diagnosis not present

## 2016-08-07 DIAGNOSIS — R42 Dizziness and giddiness: Secondary | ICD-10-CM | POA: Diagnosis not present

## 2016-08-07 DIAGNOSIS — G4700 Insomnia, unspecified: Secondary | ICD-10-CM | POA: Diagnosis not present

## 2016-08-07 DIAGNOSIS — I10 Essential (primary) hypertension: Secondary | ICD-10-CM | POA: Diagnosis not present

## 2016-08-09 DIAGNOSIS — H40033 Anatomical narrow angle, bilateral: Secondary | ICD-10-CM | POA: Diagnosis not present

## 2016-08-09 DIAGNOSIS — H40011 Open angle with borderline findings, low risk, right eye: Secondary | ICD-10-CM | POA: Diagnosis not present

## 2016-08-09 DIAGNOSIS — H2513 Age-related nuclear cataract, bilateral: Secondary | ICD-10-CM | POA: Diagnosis not present

## 2016-08-09 DIAGNOSIS — H40031 Anatomical narrow angle, right eye: Secondary | ICD-10-CM | POA: Diagnosis not present

## 2016-08-09 DIAGNOSIS — H40013 Open angle with borderline findings, low risk, bilateral: Secondary | ICD-10-CM | POA: Diagnosis not present

## 2016-08-09 DIAGNOSIS — H25013 Cortical age-related cataract, bilateral: Secondary | ICD-10-CM | POA: Diagnosis not present

## 2016-08-09 DIAGNOSIS — H40032 Anatomical narrow angle, left eye: Secondary | ICD-10-CM | POA: Diagnosis not present

## 2016-08-09 DIAGNOSIS — H40012 Open angle with borderline findings, low risk, left eye: Secondary | ICD-10-CM | POA: Diagnosis not present

## 2016-08-28 DIAGNOSIS — L814 Other melanin hyperpigmentation: Secondary | ICD-10-CM | POA: Diagnosis not present

## 2016-08-28 DIAGNOSIS — Z08 Encounter for follow-up examination after completed treatment for malignant neoplasm: Secondary | ICD-10-CM | POA: Diagnosis not present

## 2016-08-28 DIAGNOSIS — Z85820 Personal history of malignant melanoma of skin: Secondary | ICD-10-CM | POA: Diagnosis not present

## 2016-08-28 DIAGNOSIS — Z1283 Encounter for screening for malignant neoplasm of skin: Secondary | ICD-10-CM | POA: Diagnosis not present

## 2016-08-28 DIAGNOSIS — D485 Neoplasm of uncertain behavior of skin: Secondary | ICD-10-CM | POA: Diagnosis not present

## 2016-08-28 DIAGNOSIS — D224 Melanocytic nevi of scalp and neck: Secondary | ICD-10-CM | POA: Diagnosis not present

## 2016-08-28 DIAGNOSIS — D225 Melanocytic nevi of trunk: Secondary | ICD-10-CM | POA: Diagnosis not present

## 2016-08-28 DIAGNOSIS — L818 Other specified disorders of pigmentation: Secondary | ICD-10-CM | POA: Diagnosis not present

## 2016-09-03 DIAGNOSIS — K210 Gastro-esophageal reflux disease with esophagitis: Secondary | ICD-10-CM | POA: Diagnosis not present

## 2016-09-03 DIAGNOSIS — R198 Other specified symptoms and signs involving the digestive system and abdomen: Secondary | ICD-10-CM | POA: Diagnosis not present

## 2016-09-03 DIAGNOSIS — Z800 Family history of malignant neoplasm of digestive organs: Secondary | ICD-10-CM | POA: Diagnosis not present

## 2016-09-03 DIAGNOSIS — K5900 Constipation, unspecified: Secondary | ICD-10-CM | POA: Diagnosis not present

## 2016-09-04 ENCOUNTER — Encounter: Payer: Self-pay | Admitting: Obstetrics & Gynecology

## 2016-09-04 DIAGNOSIS — M7581 Other shoulder lesions, right shoulder: Secondary | ICD-10-CM | POA: Diagnosis not present

## 2016-10-25 DIAGNOSIS — Z1231 Encounter for screening mammogram for malignant neoplasm of breast: Secondary | ICD-10-CM | POA: Diagnosis not present

## 2016-11-01 ENCOUNTER — Encounter: Payer: Self-pay | Admitting: Obstetrics & Gynecology

## 2016-11-05 DIAGNOSIS — Z6825 Body mass index (BMI) 25.0-25.9, adult: Secondary | ICD-10-CM | POA: Diagnosis not present

## 2016-11-05 DIAGNOSIS — M4316 Spondylolisthesis, lumbar region: Secondary | ICD-10-CM | POA: Diagnosis not present

## 2016-11-05 DIAGNOSIS — M545 Low back pain: Secondary | ICD-10-CM | POA: Diagnosis not present

## 2016-11-05 DIAGNOSIS — M419 Scoliosis, unspecified: Secondary | ICD-10-CM | POA: Diagnosis not present

## 2016-11-06 DIAGNOSIS — H40012 Open angle with borderline findings, low risk, left eye: Secondary | ICD-10-CM | POA: Diagnosis not present

## 2016-11-06 DIAGNOSIS — H40033 Anatomical narrow angle, bilateral: Secondary | ICD-10-CM | POA: Diagnosis not present

## 2016-11-06 DIAGNOSIS — H04123 Dry eye syndrome of bilateral lacrimal glands: Secondary | ICD-10-CM | POA: Diagnosis not present

## 2016-11-06 DIAGNOSIS — H40011 Open angle with borderline findings, low risk, right eye: Secondary | ICD-10-CM | POA: Diagnosis not present

## 2016-11-06 DIAGNOSIS — H40013 Open angle with borderline findings, low risk, bilateral: Secondary | ICD-10-CM | POA: Diagnosis not present

## 2016-11-06 DIAGNOSIS — H40031 Anatomical narrow angle, right eye: Secondary | ICD-10-CM | POA: Diagnosis not present

## 2016-11-06 DIAGNOSIS — H40032 Anatomical narrow angle, left eye: Secondary | ICD-10-CM | POA: Diagnosis not present

## 2016-11-28 DIAGNOSIS — Z0289 Encounter for other administrative examinations: Secondary | ICD-10-CM

## 2016-11-29 DIAGNOSIS — Z0289 Encounter for other administrative examinations: Secondary | ICD-10-CM

## 2017-01-07 ENCOUNTER — Ambulatory Visit: Payer: BLUE CROSS/BLUE SHIELD | Admitting: Obstetrics & Gynecology

## 2017-01-15 DIAGNOSIS — L818 Other specified disorders of pigmentation: Secondary | ICD-10-CM | POA: Diagnosis not present

## 2017-01-17 NOTE — Progress Notes (Addendum)
61 y.o. G3P3 MarriedCaucasianF here for annual exam.  Doing well.  Has applied for disability now.  She did finish her settlement.  Back does still bother her but she is satisfied with the outcome.    Denies vaginal bleeding.    Patient's last menstrual period was 06/24/2008.          Sexually active: Yes.    The current method of family planning is post menopausal status, hysterectomy   Exercising: No.  The patient does not participate in regular exercise at present. Smoker:  no  Health Maintenance: Pap:  12/30/15 Neg. HR HPV:neg   10/25/14 Neg  History of abnormal Pap:  no MMG:  10/25/16 BIRADS1:Neg  Colonoscopy:  08/2014 f/u 3 years  BMD:   08/20/12 Normal  TDaP: up to date with PCP Pneumonia vaccine(s):  Done with PCP Zostavax:   Done  Hep C testing: Done with PCP Screening Labs: PCP   reports that she has never smoked. She has never used smokeless tobacco. She reports that she drinks alcohol. She reports that she does not use drugs.  Past Medical History:  Diagnosis Date  . Acid reflux    GERD  . Cancer ([place]) 08,11   melanoma-left knee, right arm  . Chronic back pain   . Complication of anesthesia   . Family history of adverse reaction to anesthesia    mother has PONV  . GERD (gastroesophageal reflux disease)   . H/O hiatal hernia   . Heart murmur    no problems  . Hypertension   . PONV (postoperative nausea and vomiting)    "zofran works great  . Seasonal allergies   . SVD (spontaneous vaginal delivery)    x 1  . Vertigo     Past Surgical History:  Procedure Laterality Date  . ABDOMINAL HYSTERECTOMY    . ANTERIOR LAT LUMBAR FUSION Right 04/17/2013   Procedure: ANTERIOR LATERAL LUMBAR FUSION 3 LEVELS;  Surgeon: Erline Levine, MD;  Location: [place] [facility];  Service: Neurosurgery;  Laterality: Right;  Right Lumbar Two-Three Lumbar Three-Four Lumbar Four-Five  Anterolateral Fusion  . [place]  . CESAREAN SECTION  1980  . COLONOSCOPY    . CYSTOSCOPY N/A  06/26/2016   Procedure: CYSTOSCOPY;  Surgeon: Megan Salon, MD;  Location: [place] [facility];  Service: Gynecology;  Laterality: N/A;  . DILATATION & CURETTAGE/HYSTEROSCOPY WITH MYOSURE N/A 01/24/2016   Procedure: DILATATION & CURETTAGE/HYSTEROSCOPY WITH MYOSURE;  Surgeon: Megan Salon, MD;  Location: [facility] [facility];  Service: Gynecology;  Laterality: N/A;  . FOOT SURGERY Right    early 20s-bone spur removal  . LAPAROSCOPIC HYSTERECTOMY Bilateral 06/26/2016   Procedure: HYSTERECTOMY TOTAL LAPAROSCOPIC;  Surgeon: Megan Salon, MD;  Location: [facility];  Service: Gynecology;  Laterality: Bilateral;  . LUMBAR PERCUTANEOUS PEDICLE SCREW 3 LEVEL N/A 04/17/2013   Procedure: LUMBAR PERCUTANEOUS PEDICLE SCREW 3 LEVEL;  Surgeon: Erline Levine, MD;  Location: [place] [facility];  Service: Neurosurgery;  Laterality: N/A;  Decompression and Posterior Lumbar Interbody Fusion of Lumbar Five-Sacral One, Percutaneous Screws Lumbar Two through Sacral One  . MELANOMA EXCISION     knee 08 lft. elbow 11rt  . SALPINGOOPHORECTOMY Bilateral 06/26/2016   Procedure: SALPINGO OOPHORECTOMY;  Surgeon: Megan Salon, MD;  Location: [facility] [facility];  Service: Gynecology;  Laterality: Bilateral;  . SHOULDER SURGERY Right 04/28/2015  . TUBAL LIGATION  1986  . WLE  06/17/07   and left groin lymph node    Current Outpatient Prescriptions  Medication Sig  Dispense Refill  . acetaminophen (TYLENOL) 500 MG tablet Take 1,000 mg by mouth every 6 (six) hours as needed for mild pain. Reported on 01/23/2016    . amitriptyline (ELAVIL) 50 MG tablet Take 1 tablet by mouth at bedtime. Reported on 01/23/2016    . Ascorbic Acid (VITAMIN C WITH ROSE HIPS) 1000 MG tablet Take 1,000 mg by mouth daily. Reported on 01/23/2016    . cetirizine (ZYRTEC) 10 MG tablet Take 10 mg by mouth daily. Reported on 01/23/2016    . Echin-Gldnseal-Gnsng-RsHp-Zn-C (V-R IMMUNE SUPPORT COMPLEX PO) Take 1 tablet by mouth daily.    Marland Kitchen estradiol (VIVELLE-DOT) 0.05 MG/24HR patch Place 1 patch (0.05 mg total) onto  the skin 2 (two) times a week. 24 patch 4  . famotidine (PEPCID) 10 MG tablet Take 10 mg by mouth daily as needed for heartburn. Reported on 01/23/2016    . ibuprofen (ADVIL,MOTRIN) 200 MG tablet Take 4 tablets (800 mg total) by mouth every 6 (six) hours as needed for moderate pain. Reported on 01/23/2016 30 tablet 0  . Lactobacillus Rhamnosus, GG, (CULTURELLE PO) Take 1 capsule by mouth daily.    Marland Kitchen losartan-hydrochlorothiazide (HYZAAR) 100-12.5 MG per tablet Take 1 tablet by mouth daily. Reported on 01/23/2016    . meclizine (ANTIVERT) 25 MG tablet Take 25 mg by mouth 3 (three) times daily as needed for dizziness. Reported on 01/23/2016    . Multiple Vitamin (MULTIVITAMIN WITH MINERALS) TABS tablet Take 1 tablet by mouth daily. Reported on 01/23/2016    . naproxen (NAPROSYN) 500 MG tablet Take 1 tablet (500 mg total) by mouth 2 (two) times daily. 30 tablet 0  . ondansetron (ZOFRAN ODT) 4 MG disintegrating tablet 4mg  ODT q4 hours prn nausea/vomit 10 tablet 0  . pantoprazole (PROTONIX) 40 MG tablet Take 40 mg by mouth daily. Reported on 01/23/2016    . polyethylene glycol (MIRALAX / GLYCOLAX) packet Take 8.5 g by mouth daily as needed for mild constipation. Reported on 01/23/2016    . tizanidine (ZANAFLEX) 2 MG capsule Take 4 mg by mouth at bedtime as needed for muscle spasms. Reported on 01/23/2016    . traMADol (ULTRAM) 50 MG tablet Take 1 tablet by mouth as needed.     No current facility-administered medications for this visit.     Family History  Problem Relation Age of Onset  . Diabetes Father     borderline  . Melanoma Father   . Congestive Heart Failure Father   . Colon cancer Mother   . Hypertension Mother   . Osteoporosis Mother   . Cancer Maternal Grandmother     "female"  . Heart disease Paternal Grandfather     ROS:  Pertinent items are noted in HPI.  Otherwise, a comprehensive ROS was negative.  Exam:   BP 122/76 (BP Location: Right Arm, Patient Position: Sitting, Cuff Size: Normal)    Pulse 98   Resp 14   Ht 5' 2.5" (1.588 m)   Wt 144 lb (65.3 kg)   LMP 06/24/2008   BMI 25.92 kg/m   Weight change: +1#  Height: 5' 2.5" (158.8 cm)  Ht Readings from Last 3 Encounters:  01/18/17 5' 2.5" (1.588 m)  07/24/16 5\' 3"  (1.6 m)  07/09/16 5\' 3"  (1.6 m)   General appearance: alert, cooperative and appears stated age Head: Normocephalic, without obvious abnormality, atraumatic Neck: no adenopathy, supple, symmetrical, trachea midline and thyroid normal to inspection and palpation Lungs: clear to auscultation bilaterally Breasts: normal appearance, no masses or  tenderness Heart: regular rate and rhythm Abdomen: soft, non-tender; bowel sounds normal; no masses,  no organomegaly Extremities: extremities normal, atraumatic, no cyanosis or edema Skin: Skin color, texture, turgor normal. No rashes or lesions Lymph nodes: Cervical, supraclavicular, and axillary nodes normal. No abnormal inguinal nodes palpated Neurologic: Grossly normal   Pelvic: External genitalia:  no lesions              Urethra:  normal appearing urethra with no masses, tenderness or lesions              Bartholins and Skenes: normal                 Vagina: normal appearing vagina with normal color and discharge, small area of granulation tissue at vaginal cuff              Cervix: absent              Pap taken: No. Bimanual Exam:  Uterus:  uterus absent              Adnexa: no mass, fullness, tenderness               Rectovaginal: Confirms               Anus:  normal sphincter tone, no lesions  Chaperone was present for exam.  A:  Well Woman with normal exam PMP, no HRT Hysterectomy due to persistent PMP bleeding Vaginal cuff granulation tissue  P:   Mammogram guidelines discussed Will plan BMD with MMG next year pap smear not indicated Decrease HRT to 0.0375mg  patches twice weekly.  #24/4RF Being seen regularly with dermatologist, locally and at [facility] work with Dr. Alyson Ingles Rx for estrace vaginal  cream.  1 gm 1-2 times weekly.  #42.5gm Return annually or prn

## 2017-01-18 ENCOUNTER — Encounter: Payer: Self-pay | Admitting: Obstetrics & Gynecology

## 2017-01-18 ENCOUNTER — Ambulatory Visit (INDEPENDENT_AMBULATORY_CARE_PROVIDER_SITE_OTHER): Payer: BLUE CROSS/BLUE SHIELD | Admitting: Obstetrics & Gynecology

## 2017-01-18 VITALS — BP 122/76 | HR 98 | Resp 14 | Ht 62.5 in | Wt 144.0 lb

## 2017-01-18 DIAGNOSIS — Z01419 Encounter for gynecological examination (general) (routine) without abnormal findings: Secondary | ICD-10-CM

## 2017-01-18 MED ORDER — ESTRADIOL 0.0375 MG/24HR TD PTTW: 1.0000 | MEDICATED_PATCH | TRANSDERMAL | 4 refills | Status: DC

## 2017-01-18 MED ORDER — ESTRADIOL 0.1 MG/GM VA CREA: TOPICAL_CREAM | VAGINAL | 0 refills | Status: DC

## 2017-01-18 NOTE — Addendum Note (Signed)
Addended by: Megan Salon on: 01/18/2017 02:48 PM   Modules accepted: Orders

## 2017-01-18 NOTE — Patient Instructions (Addendum)
Plan bone density with your mammogram next year.    Double check about your last tetanus shot

## 2017-02-04 DIAGNOSIS — G4700 Insomnia, unspecified: Secondary | ICD-10-CM | POA: Diagnosis not present

## 2017-02-04 DIAGNOSIS — N390 Urinary tract infection, site not specified: Secondary | ICD-10-CM | POA: Diagnosis not present

## 2017-02-04 DIAGNOSIS — Z0000 Encounter for general adult medical examination without abnormal findings: Secondary | ICD-10-CM | POA: Diagnosis not present

## 2017-02-04 DIAGNOSIS — R42 Dizziness and giddiness: Secondary | ICD-10-CM | POA: Diagnosis not present

## 2017-02-04 DIAGNOSIS — I10 Essential (primary) hypertension: Secondary | ICD-10-CM | POA: Diagnosis not present

## 2017-02-26 DIAGNOSIS — Z08 Encounter for follow-up examination after completed treatment for malignant neoplasm: Secondary | ICD-10-CM | POA: Diagnosis not present

## 2017-02-26 DIAGNOSIS — Z85820 Personal history of malignant melanoma of skin: Secondary | ICD-10-CM | POA: Diagnosis not present

## 2017-02-26 DIAGNOSIS — Z6824 Body mass index (BMI) 24.0-24.9, adult: Secondary | ICD-10-CM | POA: Diagnosis not present

## 2017-02-26 DIAGNOSIS — C4372 Malignant melanoma of left lower limb, including hip: Secondary | ICD-10-CM | POA: Diagnosis not present

## 2017-04-19 ENCOUNTER — Ambulatory Visit: Payer: BLUE CROSS/BLUE SHIELD | Admitting: Obstetrics & Gynecology

## 2017-04-30 ENCOUNTER — Encounter: Payer: Self-pay | Admitting: Obstetrics & Gynecology

## 2017-04-30 ENCOUNTER — Ambulatory Visit (INDEPENDENT_AMBULATORY_CARE_PROVIDER_SITE_OTHER): Payer: BLUE CROSS/BLUE SHIELD | Admitting: Obstetrics & Gynecology

## 2017-04-30 VITALS — BP 112/80 | HR 80 | Resp 14 | Ht 62.5 in | Wt 142.0 lb

## 2017-04-30 DIAGNOSIS — N898 Other specified noninflammatory disorders of vagina: Secondary | ICD-10-CM | POA: Diagnosis not present

## 2017-04-30 NOTE — Progress Notes (Signed)
GYNECOLOGY  VISIT   HPI: 61 y.o. G3P3 Married Caucasian female here for follow-up.  On Vivelle dot 0.0375mg  patches twice weekly.  Not having a many hot flashes.  Has tolerated this really well.  Did use vaginal estrogen.  Denies further vaginal bleeding.  No issues with intercourse.  Released from [facility] dermatology--so happy about this.  GYNECOLOGIC HISTORY: Patient's last menstrual period was 06/24/2008. Contraception: hysterectomy Menopausal hormone therapy: Vivelle dot 0.375mg  patches twice weekly.  Patient Active Problem List   Diagnosis Date Noted  . Hiatal hernia 12/30/2015  . Malignant melanoma of lower leg ([place]) 03/10/2015  . Melanoma of right upper arm ([place]) 10/12/2013  . GERD (gastroesophageal reflux disease) 10/12/2013  . Essential hypertension, benign 10/12/2013    Past Medical History:  Diagnosis Date  . Acid reflux    GERD  . Cancer ([place]) 08,11   melanoma-left knee, right arm  . Chronic back pain   . Complication of anesthesia   . Family history of adverse reaction to anesthesia    mother has PONV  . GERD (gastroesophageal reflux disease)   . H/O hiatal hernia   . Heart murmur    no problems  . Hypertension   . PONV (postoperative nausea and vomiting)    "zofran works great  . Seasonal allergies   . SVD (spontaneous vaginal delivery)    x 1  . Vertigo     Past Surgical History:  Procedure Laterality Date  . ABDOMINAL HYSTERECTOMY    . ANTERIOR LAT LUMBAR FUSION Right 04/17/2013   Procedure: ANTERIOR LATERAL LUMBAR FUSION 3 LEVELS;  Surgeon: Erline Levine, MD;  Location: [place] [facility];  Service: Neurosurgery;  Laterality: Right;  Right Lumbar Two-Three Lumbar Three-Four Lumbar Four-Five  Anterolateral Fusion  . [place]  . CESAREAN SECTION  1980  . COLONOSCOPY    . CYSTOSCOPY N/A 06/26/2016   Procedure: CYSTOSCOPY;  Surgeon: Megan Salon, MD;  Location: [facility];  Service: Gynecology;  Laterality: N/A;  . DILATATION & CURETTAGE/HYSTEROSCOPY  WITH MYOSURE N/A 01/24/2016   Procedure: DILATATION & CURETTAGE/HYSTEROSCOPY WITH MYOSURE;  Surgeon: Megan Salon, MD;  Location: [facility];  Service: Gynecology;  Laterality: N/A;  . FOOT SURGERY Right    early 20s-bone spur removal  . LAPAROSCOPIC HYSTERECTOMY Bilateral 06/26/2016   Procedure: HYSTERECTOMY TOTAL LAPAROSCOPIC;  Surgeon: Megan Salon, MD;  Location: [facility];  Service: Gynecology;  Laterality: Bilateral;  . LUMBAR PERCUTANEOUS PEDICLE SCREW 3 LEVEL N/A 04/17/2013   Procedure: LUMBAR PERCUTANEOUS PEDICLE SCREW 3 LEVEL;  Surgeon: Erline Levine, MD;  Location: [place] [facility];  Service: Neurosurgery;  Laterality: N/A;  Decompression and Posterior Lumbar Interbody Fusion of Lumbar Five-Sacral One, Percutaneous Screws Lumbar Two through Sacral One  . MELANOMA EXCISION     knee 08 lft. elbow 11rt  . SALPINGOOPHORECTOMY Bilateral 06/26/2016   Procedure: SALPINGO OOPHORECTOMY;  Surgeon: Megan Salon, MD;  Location: [place] [facility];  Service: Gynecology;  Laterality: Bilateral;  . SHOULDER SURGERY Right 04/28/2015  . TUBAL LIGATION  1986  . WLE  06/17/07   and left groin lymph node    MEDS:   Current Outpatient Prescriptions on File Prior to Visit  Medication Sig Dispense Refill  . acetaminophen (TYLENOL) 500 MG tablet Take 1,000 mg by mouth every 6 (six) hours as needed for mild pain. Reported on 01/23/2016    . amitriptyline (ELAVIL) 50 MG tablet Take 1 tablet by mouth at bedtime. Reported on 01/23/2016    . Ascorbic Acid (VITAMIN  C WITH ROSE HIPS) 1000 MG tablet Take 1,000 mg by mouth daily. Reported on 01/23/2016    . cetirizine (ZYRTEC) 10 MG tablet Take 10 mg by mouth daily. Reported on 01/23/2016    . Echin-Gldnseal-Gnsng-RsHp-Zn-C (V-R IMMUNE SUPPORT COMPLEX PO) Take 1 tablet by mouth daily.    Marland Kitchen estradiol (VIVELLE-DOT) 0.0375 MG/24HR Place 1 patch onto the skin 2 (two) times a week. 24 patch 4  . famotidine (PEPCID) 10 MG tablet Take 10 mg by mouth daily as needed for heartburn. Reported on 01/23/2016     . ibuprofen (ADVIL,MOTRIN) 200 MG tablet Take 4 tablets (800 mg total) by mouth every 6 (six) hours as needed for moderate pain. Reported on 01/23/2016 30 tablet 0  . Lactobacillus Rhamnosus, GG, (CULTURELLE PO) Take 1 capsule by mouth daily.    Marland Kitchen losartan-hydrochlorothiazide (HYZAAR) 100-12.5 MG per tablet Take 1 tablet by mouth daily. Reported on 01/23/2016    . meclizine (ANTIVERT) 25 MG tablet Take 25 mg by mouth 3 (three) times daily as needed for dizziness. Reported on 01/23/2016    . Multiple Vitamin (MULTIVITAMIN WITH MINERALS) TABS tablet Take 1 tablet by mouth daily. Reported on 01/23/2016    . naproxen (NAPROSYN) 500 MG tablet Take 1 tablet (500 mg total) by mouth 2 (two) times daily. 30 tablet 0  . ondansetron (ZOFRAN ODT) 4 MG disintegrating tablet 4mg  ODT q4 hours prn nausea/vomit 10 tablet 0  . pantoprazole (PROTONIX) 40 MG tablet Take 40 mg by mouth daily. Reported on 01/23/2016    . polyethylene glycol (MIRALAX / GLYCOLAX) packet Take 8.5 g by mouth daily as needed for mild constipation. Reported on 01/23/2016    . tizanidine (ZANAFLEX) 2 MG capsule Take 4 mg by mouth at bedtime as needed for muscle spasms. Reported on 01/23/2016    . traMADol (ULTRAM) 50 MG tablet Take 1 tablet by mouth as needed.     No current facility-administered medications on file prior to visit.      ALLERGIES: Sulfur  Family History  Problem Relation Age of Onset  . Diabetes Father        borderline  . Melanoma Father   . Congestive Heart Failure Father   . Colon cancer Mother   . Hypertension Mother   . Osteoporosis Mother   . Cancer Maternal Grandmother        "female"  . Heart disease Paternal Grandfather     SH:  Married, non smoker  Review of Systems  All other systems reviewed and are negative.   PHYSICAL EXAMINATION:    BP 112/80 (BP Location: Right Arm, Patient Position: Sitting, Cuff Size: Normal)   Pulse 80   Resp 14   Ht 5' 2.5" (1.588 m)   Wt 142 lb (64.4 kg)   LMP 06/24/2008    BMI 25.56 kg/m     General appearance: alert, cooperative and appears stated age Abdomen: soft, non-tender; bowel sounds normal; no masses,  no organomegaly  Pelvic: External genitalia:  no lesions              Urethra:  normal appearing urethra with no masses, tenderness or lesions              Bartholins and Skenes: normal                 Vagina: normal appearing vagina with normal color and discharge, two pin-point areas of granuation tissue.              Cervix:  absent              Bimanual Exam:  Uterus:  uterus absent              Adnexa: no mass, fullness, tenderness              Anus:  no lesions  Chaperone was present for exam.  Assessment: Two pinpoint areas of granulation tissue, much smaller than prior visit  Plan: Will recheck at [facility].  Continue vaginal estrogen cream 1-2 times weekly.  Rx for vaginal estrogen cream, up to twice weekly.  ~15 minutes spent with patient >50% of time was in face to face discussion of above.  Pt has many questions which were all aaddressed.

## 2017-05-01 DIAGNOSIS — M4316 Spondylolisthesis, lumbar region: Secondary | ICD-10-CM | POA: Diagnosis not present

## 2017-05-01 DIAGNOSIS — M5415 Radiculopathy, thoracolumbar region: Secondary | ICD-10-CM | POA: Diagnosis not present

## 2017-05-01 DIAGNOSIS — M545 Low back pain: Secondary | ICD-10-CM | POA: Diagnosis not present

## 2017-05-01 DIAGNOSIS — Z6825 Body mass index (BMI) 25.0-25.9, adult: Secondary | ICD-10-CM | POA: Diagnosis not present

## 2017-05-06 DIAGNOSIS — M4316 Spondylolisthesis, lumbar region: Secondary | ICD-10-CM | POA: Diagnosis not present

## 2017-05-06 DIAGNOSIS — M5415 Radiculopathy, thoracolumbar region: Secondary | ICD-10-CM | POA: Diagnosis not present

## 2017-06-03 DIAGNOSIS — M419 Scoliosis, unspecified: Secondary | ICD-10-CM | POA: Diagnosis not present

## 2017-06-03 DIAGNOSIS — M545 Low back pain: Secondary | ICD-10-CM | POA: Diagnosis not present

## 2017-06-03 DIAGNOSIS — M5415 Radiculopathy, thoracolumbar region: Secondary | ICD-10-CM | POA: Diagnosis not present

## 2017-06-03 DIAGNOSIS — M4316 Spondylolisthesis, lumbar region: Secondary | ICD-10-CM | POA: Diagnosis not present

## 2017-08-12 DIAGNOSIS — K5900 Constipation, unspecified: Secondary | ICD-10-CM | POA: Diagnosis not present

## 2017-08-12 DIAGNOSIS — Z800 Family history of malignant neoplasm of digestive organs: Secondary | ICD-10-CM | POA: Diagnosis not present

## 2017-08-13 ENCOUNTER — Other Ambulatory Visit: Payer: Self-pay | Admitting: Gastroenterology

## 2017-08-14 DIAGNOSIS — H40013 Open angle with borderline findings, low risk, bilateral: Secondary | ICD-10-CM | POA: Diagnosis not present

## 2017-08-14 DIAGNOSIS — H25013 Cortical age-related cataract, bilateral: Secondary | ICD-10-CM | POA: Diagnosis not present

## 2017-08-14 DIAGNOSIS — H40033 Anatomical narrow angle, bilateral: Secondary | ICD-10-CM | POA: Diagnosis not present

## 2017-08-14 DIAGNOSIS — H2513 Age-related nuclear cataract, bilateral: Secondary | ICD-10-CM | POA: Diagnosis not present

## 2017-08-30 DIAGNOSIS — Z85820 Personal history of malignant melanoma of skin: Secondary | ICD-10-CM | POA: Diagnosis not present

## 2017-08-30 DIAGNOSIS — Z1283 Encounter for screening for malignant neoplasm of skin: Secondary | ICD-10-CM | POA: Diagnosis not present

## 2017-08-30 DIAGNOSIS — X32XXXD Exposure to sunlight, subsequent encounter: Secondary | ICD-10-CM | POA: Diagnosis not present

## 2017-08-30 DIAGNOSIS — L570 Actinic keratosis: Secondary | ICD-10-CM | POA: Diagnosis not present

## 2017-08-30 DIAGNOSIS — Z08 Encounter for follow-up examination after completed treatment for malignant neoplasm: Secondary | ICD-10-CM | POA: Diagnosis not present

## 2017-09-04 ENCOUNTER — Other Ambulatory Visit: Payer: Self-pay | Admitting: Gastroenterology

## 2017-09-04 DIAGNOSIS — Z800 Family history of malignant neoplasm of digestive organs: Secondary | ICD-10-CM

## 2017-10-16 DIAGNOSIS — M65311 Trigger thumb, right thumb: Secondary | ICD-10-CM | POA: Diagnosis not present

## 2017-10-16 DIAGNOSIS — M1811 Unilateral primary osteoarthritis of first carpometacarpal joint, right hand: Secondary | ICD-10-CM | POA: Diagnosis not present

## 2017-10-16 DIAGNOSIS — M79644 Pain in right finger(s): Secondary | ICD-10-CM | POA: Diagnosis not present

## 2017-10-21 DIAGNOSIS — M545 Low back pain: Secondary | ICD-10-CM | POA: Diagnosis not present

## 2017-10-21 DIAGNOSIS — M4316 Spondylolisthesis, lumbar region: Secondary | ICD-10-CM | POA: Diagnosis not present

## 2017-10-21 DIAGNOSIS — M5415 Radiculopathy, thoracolumbar region: Secondary | ICD-10-CM | POA: Diagnosis not present

## 2017-10-21 DIAGNOSIS — M419 Scoliosis, unspecified: Secondary | ICD-10-CM | POA: Diagnosis not present

## 2017-10-25 DIAGNOSIS — D0511 Intraductal carcinoma in situ of right breast: Secondary | ICD-10-CM

## 2017-10-25 HISTORY — DX: Intraductal carcinoma in situ of right breast: D05.11

## 2017-10-30 DIAGNOSIS — Z1231 Encounter for screening mammogram for malignant neoplasm of breast: Secondary | ICD-10-CM | POA: Diagnosis not present

## 2017-10-30 DIAGNOSIS — Z780 Asymptomatic menopausal state: Secondary | ICD-10-CM | POA: Diagnosis not present

## 2017-11-07 DIAGNOSIS — R921 Mammographic calcification found on diagnostic imaging of breast: Secondary | ICD-10-CM | POA: Diagnosis not present

## 2017-11-13 DIAGNOSIS — M65311 Trigger thumb, right thumb: Secondary | ICD-10-CM | POA: Diagnosis not present

## 2017-11-14 ENCOUNTER — Telehealth: Payer: Self-pay | Admitting: Obstetrics & Gynecology

## 2017-11-14 NOTE — Telephone Encounter (Signed)
1. Patient called for recent bone density results from [facility].   2. She reported she is also having a biopsy on her right breast on 11/19/17 at [place].

## 2017-11-14 NOTE — Telephone Encounter (Signed)
Spoke with patient. Patient states she is scheduled for right breast biopsy on 11/19/17 at [facility]. Patient wants to make sure Dr. Sabra Heck is aware. Patient is concerned about upcoming biopsy, is aware Dr. Sabra Heck will be notified of results. Patient asking if she could come in to discuss biopsy results and stopping estrogen with Dr. Sabra Heck, if needed?   Advised patient we can schedule a consult with Dr. Sabra Heck, just return call to office to schedule. Advised [facility] will assist with guidance after biopsy if additional recommendations are provided.   Patient request BMD results dated 10/30/17. Advised will obtain copy of results and return call once reviewed by Dr. Sabra Heck. Patient thankful for return call and verbalizes understanding.    Call placed to [facility], spoke with [facility] in Medical records. Requested copy of BMD dated 10/30/17 be faxed to [facility] at [phone].

## 2017-11-14 NOTE — Telephone Encounter (Signed)
Left message to call Delon Revelo at [phone].  

## 2017-11-15 NOTE — Telephone Encounter (Signed)
BMD results to Dr. Sabra Heck for review.

## 2017-11-15 NOTE — Telephone Encounter (Signed)
Spoke with patient. Results and message given as seen below from [place]. Patient verbalizes understanding. Encounter closed.

## 2017-11-15 NOTE — Telephone Encounter (Signed)
Left message to call Janaisa Birkland at [phone].  

## 2017-11-15 NOTE — Telephone Encounter (Signed)
Please let her know her bone density was normal.  This is great.  Repeat in 4-5 years.  I'm happy to review her breast biopsy results with her after they are done.  Whatever works for her will be fine with me.

## 2017-11-17 DIAGNOSIS — J069 Acute upper respiratory infection, unspecified: Secondary | ICD-10-CM | POA: Diagnosis not present

## 2017-11-18 ENCOUNTER — Encounter: Payer: Self-pay | Admitting: Obstetrics & Gynecology

## 2017-11-19 ENCOUNTER — Other Ambulatory Visit: Payer: Self-pay | Admitting: Radiology

## 2017-11-19 DIAGNOSIS — D0511 Intraductal carcinoma in situ of right breast: Secondary | ICD-10-CM | POA: Diagnosis not present

## 2017-11-19 DIAGNOSIS — R921 Mammographic calcification found on diagnostic imaging of breast: Secondary | ICD-10-CM | POA: Diagnosis not present

## 2017-11-20 ENCOUNTER — Telehealth: Payer: Self-pay | Admitting: Obstetrics & Gynecology

## 2017-11-20 NOTE — Telephone Encounter (Signed)
Spoke with patient. Advised of message as seen below from [place]. Patient verbalizes understanding. Appointment scheduled with Dr.Miller on 11/21/2017 at 2:30 pm. Patient is agreeable to date and time.  Routing to provider for final review. Patient agreeable to disposition. Will close encounter.

## 2017-11-20 NOTE — Telephone Encounter (Signed)
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  So, she does now need to stop her HRT.  May need OV.  Ok to schedule if she desires.

## 2017-11-20 NOTE — Telephone Encounter (Signed)
Call to [facility]. Left message with medical records for release of biopsy results once finalized for Dr.Miller's review. Provided fax number and number for return call. Routing to [place] as Juluis Rainier.

## 2017-11-20 NOTE — Telephone Encounter (Signed)
Patient was seen yesterday at [facility] for a biopsy. Patient said 'I learned that I have an early form of breast cancer". Patient is asking for a call when Dr.Miller receives the result.

## 2017-11-21 ENCOUNTER — Telehealth: Payer: Self-pay | Admitting: Hematology

## 2017-11-21 ENCOUNTER — Ambulatory Visit: Payer: BLUE CROSS/BLUE SHIELD | Admitting: Obstetrics & Gynecology

## 2017-11-21 ENCOUNTER — Encounter: Payer: Self-pay | Admitting: Obstetrics & Gynecology

## 2017-11-21 VITALS — BP 118/82 | HR 92 | Resp 16 | Ht 62.5 in | Wt 119.0 lb

## 2017-11-21 DIAGNOSIS — D0511 Intraductal carcinoma in situ of right breast: Secondary | ICD-10-CM

## 2017-11-21 DIAGNOSIS — K4090 Unilateral inguinal hernia, without obstruction or gangrene, not specified as recurrent: Secondary | ICD-10-CM

## 2017-11-21 MED ORDER — GABAPENTIN 100 MG PO CAPS: 100.0000 mg | ORAL_CAPSULE | Freq: Every day | ORAL | 1 refills | Status: DC

## 2017-11-21 NOTE — Progress Notes (Signed)
GYNECOLOGY  VISIT  CC:   Discuss recent breast pathology  HPI: 61 y.o. G3P3 Married Caucasian female here to discuss recent breast pathology showing DCIS.  She is scheduled for by the cancer center where she will see oncology, general surgery, radiation oncology next Wednesday.  She does not know the provider she will be seeing.  She like me to check o on this if possible.  She knows she needs to stop her estrogen patch today.  She is anxious about hot flashes.  Alternative treatment regimens were reviewed.  She is can start gabapentin 100 mg nightly at this point.  She was on this in the past for back pain but a much higher dose did have some side effects.  Questions about treatment answered to the best of my ability.  My one piece of input is that I do think she is genetic testing.  She has personal history of melanoma and now DCIS of the breast.  States she will specifically asked about this at her appointment next Wednesday.  Also has questions about possible LLQ hernia.  Has noticed a bulge that is present with prolonged standing or with lifting anything of any size.  Does sometimes cause aches in the area.  GYNECOLOGIC HISTORY: Patient's last menstrual period was 06/24/2008. Contraception: post menopausal  Menopausal hormone therapy: vivelle dot  Patient Active Problem List   Diagnosis Date Noted  . Hiatal hernia 12/30/2015  . Malignant melanoma of lower leg ([place]) 03/10/2015  . Melanoma of right upper arm ([place]) 10/12/2013  . GERD (gastroesophageal reflux disease) 10/12/2013  . Essential hypertension, benign 10/12/2013    Past Medical History:  Diagnosis Date  . Acid reflux    GERD  . Cancer ([place]) 08,11   melanoma-left knee, right arm  . Chronic back pain   . Complication of anesthesia   . Ductal carcinoma in situ (DCIS) of right breast 10/2017  . Family history of adverse reaction to anesthesia    mother has PONV  . GERD (gastroesophageal reflux disease)   . H/O hiatal hernia    . Heart murmur    no problems  . Hypertension   . PONV (postoperative nausea and vomiting)    "zofran works great  . Seasonal allergies   . SVD (spontaneous vaginal delivery)    x 1  . Vertigo     Past Surgical History:  Procedure Laterality Date  . ABDOMINAL HYSTERECTOMY    . ANTERIOR LAT LUMBAR FUSION Right 04/17/2013   Procedure: ANTERIOR LATERAL LUMBAR FUSION 3 LEVELS;  Surgeon: Erline Levine, MD;  Location: [facility];  Service: Neurosurgery;  Laterality: Right;  Right Lumbar Two-Three Lumbar Three-Four Lumbar Four-Five  Anterolateral Fusion  . [place]  . CESAREAN SECTION  1980  . COLONOSCOPY    . CYSTOSCOPY N/A 06/26/2016   Procedure: CYSTOSCOPY;  Surgeon: Megan Salon, MD;  Location: [facility];  Service: Gynecology;  Laterality: N/A;  . DILATATION & CURETTAGE/HYSTEROSCOPY WITH MYOSURE N/A 01/24/2016   Procedure: DILATATION & CURETTAGE/HYSTEROSCOPY WITH MYOSURE;  Surgeon: Megan Salon, MD;  Location: [facility];  Service: Gynecology;  Laterality: N/A;  . FOOT SURGERY Right    early 20s-bone spur removal  . LAPAROSCOPIC HYSTERECTOMY Bilateral 06/26/2016   Procedure: HYSTERECTOMY TOTAL LAPAROSCOPIC;  Surgeon: Megan Salon, MD;  Location: [facility];  Service: Gynecology;  Laterality: Bilateral;  . LUMBAR PERCUTANEOUS PEDICLE SCREW 3 LEVEL N/A 04/17/2013   Procedure: LUMBAR PERCUTANEOUS PEDICLE SCREW 3 LEVEL;  Surgeon: Erline Levine,  MD;  Location: [facility];  Service: Neurosurgery;  Laterality: N/A;  Decompression and Posterior Lumbar Interbody Fusion of Lumbar Five-Sacral One, Percutaneous Screws Lumbar Two through Sacral One  . MELANOMA EXCISION     knee 08 lft. elbow 11rt  . SALPINGOOPHORECTOMY Bilateral 06/26/2016   Procedure: SALPINGO OOPHORECTOMY;  Surgeon: Megan Salon, MD;  Location: [facility];  Service: Gynecology;  Laterality: Bilateral;  . SHOULDER SURGERY Right 04/28/2015  . TUBAL LIGATION  1986  . WLE  06/17/07   and left groin lymph node    MEDS:   Current  Outpatient Medications on File Prior to Visit  Medication Sig Dispense Refill  . acetaminophen (TYLENOL) 500 MG tablet Take 1,000 mg by mouth every 6 (six) hours as needed for mild pain. Reported on 01/23/2016    . amitriptyline (ELAVIL) 50 MG tablet Take 1 tablet by mouth at bedtime. Reported on 01/23/2016    . Ascorbic Acid (VITAMIN C WITH ROSE HIPS) 1000 MG tablet Take 1,000 mg by mouth daily. Reported on 01/23/2016    . Calcium 500-100 MG-UNIT CHEW Chew by mouth daily.    . cetirizine (ZYRTEC) 10 MG tablet Take 10 mg by mouth daily. Reported on 01/23/2016    . Echin-Gldnseal-Gnsng-RsHp-Zn-C (V-R IMMUNE SUPPORT COMPLEX PO) Take 1 tablet by mouth daily.    . famotidine (PEPCID) 10 MG tablet Take 10 mg by mouth daily as needed for heartburn. Reported on 01/23/2016    . FIBER COMPLETE PO Take by mouth daily.    Marland Kitchen ibuprofen (ADVIL,MOTRIN) 200 MG tablet Take 4 tablets (800 mg total) by mouth every 6 (six) hours as needed for moderate pain. Reported on 01/23/2016 30 tablet 0  . Lactobacillus Rhamnosus, GG, (CULTURELLE PO) Take 1 capsule by mouth daily.    Marland Kitchen losartan-hydrochlorothiazide (HYZAAR) 100-12.5 MG per tablet Take 1 tablet by mouth daily. Reported on 01/23/2016    . meclizine (ANTIVERT) 25 MG tablet Take 25 mg by mouth 3 (three) times daily as needed for dizziness. Reported on 01/23/2016    . Multiple Vitamin (MULTIVITAMIN WITH MINERALS) TABS tablet Take 1 tablet by mouth daily. Reported on 01/23/2016    . naproxen (NAPROSYN) 500 MG tablet Take 1 tablet (500 mg total) by mouth 2 (two) times daily. 30 tablet 0  . ondansetron (ZOFRAN ODT) 4 MG disintegrating tablet 4mg  ODT q4 hours prn nausea/vomit 10 tablet 0  . pantoprazole (PROTONIX) 40 MG tablet Take 40 mg by mouth daily. Reported on 01/23/2016    . polyethylene glycol (MIRALAX / GLYCOLAX) packet Take 8.5 g by mouth daily as needed for mild constipation. Reported on 01/23/2016    . tizanidine (ZANAFLEX) 2 MG capsule Take 4 mg by mouth at bedtime as needed for  muscle spasms. Reported on 01/23/2016    . traMADol (ULTRAM) 50 MG tablet Take 1 tablet by mouth as needed.     No current facility-administered medications on file prior to visit.     ALLERGIES: Sulfur  Family History  Problem Relation Age of Onset  . Diabetes Father        borderline  . Melanoma Father   . Congestive Heart Failure Father   . Colon cancer Mother   . Hypertension Mother   . Osteoporosis Mother   . Cancer Maternal Grandmother        "female"  . Heart disease Paternal Grandfather     SH: Married, non-smoker  Review of Systems  All other systems reviewed and are negative.   PHYSICAL EXAMINATION:  BP 118/82 (BP Location: Right Arm, Patient Position: Sitting, Cuff Size: Normal)   Pulse 92   Resp 16   Ht 5' 2.5" (1.588 m)   Wt 119 lb (54 kg)   LMP 06/24/2008   BMI 21.42 kg/m     General appearance: alert, cooperative and appears stated age Abdomen: soft, non-tender; bowel sounds normal; LLQ mass noted with fullness with Valsalva maneuver noted--c/w hernia,  no organomegaly  Pelvic: External genitalia:  no lesions              Urethra:  normal appearing urethra with no masses, tenderness or lesions              Bartholins and Skenes: normal                 Vagina: normal appearing vagina with normal color and discharge, no lesions              Cervix: no lesions              Bimanual Exam:  Uterus:  normal size, contour, position, consistency, mobility, non-tender              Adnexa: no mass, fullness, tenderness              Rectovaginal: No..  Confirms.              Anus:  normal sphincter tone, no lesions  Chaperone was present for exam.  Assessment: Recent DCIS diagnosis H/o melanoma RLQ hernia Has been on HRT, off now.  Plan: Will find out about her providers so she is aware who she will be seeing. Have advised to ask specifically about genetic testing Advised to discuss hernia with general surgeon that she will know how to proceed with  scheduling Gabapentin 100mg  nightly.   ~20 minutes spent with patient >50% of time was in face to face discussion of above.

## 2017-11-21 NOTE — Telephone Encounter (Signed)
Spoke with patient to confirm afternoon [facility] appointment for 3/6, [facility] patient no packet sent

## 2017-11-21 NOTE — Patient Instructions (Signed)
Do you need genetic testing??

## 2017-11-22 ENCOUNTER — Other Ambulatory Visit: Payer: Self-pay | Admitting: *Deleted

## 2017-11-22 DIAGNOSIS — D0511 Intraductal carcinoma in situ of right breast: Secondary | ICD-10-CM | POA: Insufficient documentation

## 2017-11-27 ENCOUNTER — Encounter: Payer: Self-pay | Admitting: Hematology

## 2017-11-27 ENCOUNTER — Other Ambulatory Visit: Payer: Self-pay | Admitting: General Surgery

## 2017-11-27 ENCOUNTER — Encounter: Payer: Self-pay | Admitting: General Practice

## 2017-11-27 ENCOUNTER — Ambulatory Visit: Payer: BLUE CROSS/BLUE SHIELD | Admitting: Physical Therapy

## 2017-11-27 ENCOUNTER — Ambulatory Visit
Admission: RE | Admit: 2017-11-27 | Discharge: 2017-11-27 | Disposition: A | Discharge status: Home / Self Care | Payer: BLUE CROSS/BLUE SHIELD | Source: Ambulatory Visit | Attending: Radiation Oncology | Admitting: Radiation Oncology

## 2017-11-27 ENCOUNTER — Inpatient Hospital Stay: Payer: BLUE CROSS/BLUE SHIELD | Attending: Hematology | Admitting: Hematology

## 2017-11-27 ENCOUNTER — Inpatient Hospital Stay: Payer: BLUE CROSS/BLUE SHIELD

## 2017-11-27 VITALS — BP 156/93 | HR 81 | Temp 98.0°F | Resp 18 | Ht 62.5 in | Wt 116.5 lb

## 2017-11-27 DIAGNOSIS — D0511 Intraductal carcinoma in situ of right breast: Secondary | ICD-10-CM | POA: Insufficient documentation

## 2017-11-27 DIAGNOSIS — Z800 Family history of malignant neoplasm of digestive organs: Secondary | ICD-10-CM | POA: Diagnosis not present

## 2017-11-27 DIAGNOSIS — Z90710 Acquired absence of both cervix and uterus: Secondary | ICD-10-CM | POA: Diagnosis not present

## 2017-11-27 DIAGNOSIS — M549 Dorsalgia, unspecified: Secondary | ICD-10-CM | POA: Diagnosis not present

## 2017-11-27 DIAGNOSIS — I10 Essential (primary) hypertension: Secondary | ICD-10-CM | POA: Insufficient documentation

## 2017-11-27 DIAGNOSIS — C50311 Malignant neoplasm of lower-inner quadrant of right female breast: Secondary | ICD-10-CM

## 2017-11-27 DIAGNOSIS — Z85820 Personal history of malignant melanoma of skin: Secondary | ICD-10-CM | POA: Diagnosis not present

## 2017-11-27 DIAGNOSIS — Z170 Estrogen receptor positive status [ER+]: Secondary | ICD-10-CM | POA: Insufficient documentation

## 2017-11-27 DIAGNOSIS — G8929 Other chronic pain: Secondary | ICD-10-CM

## 2017-11-27 LAB — CMP (CANCER CENTER ONLY)
ALT: 20 U/L (ref 0–55)
ANION GAP: 10 (ref 3–11)
AST: 20 U/L (ref 5–34)
Albumin: 4 g/dL (ref 3.5–5.0)
Alkaline Phosphatase: 76 U/L (ref 40–150)
BUN: 22 mg/dL (ref 7–26)
CHLORIDE: 101 mmol/L (ref 98–109)
CO2: 28 mmol/L (ref 22–29)
Calcium: 10 mg/dL (ref 8.4–10.4)
Creatinine: 0.85 mg/dL (ref 0.60–1.10)
GFR, Estimated: >60 mL/min (ref >60)
Glucose, Bld: 75 mg/dL (ref 70–140)
POTASSIUM: 3.8 mmol/L (ref 3.5–5.1)
SODIUM: 139 mmol/L (ref 136–145)
Total Bilirubin: 0.4 mg/dL (ref 0.2–1.2)
Total Protein: 7.3 g/dL (ref 6.4–8.3)

## 2017-11-27 LAB — CBC WITH DIFFERENTIAL (CANCER CENTER ONLY)
Basophils Absolute: 0 10*3/uL (ref 0.0–0.1)
Basophils Relative: 1 %
EOS ABS: 0 10*3/uL (ref 0.0–0.5)
Eosinophils Relative: 1 %
HEMATOCRIT: 37.3 % (ref 34.8–46.6)
HEMOGLOBIN: 12.7 g/dL (ref 11.6–15.9)
LYMPHS ABS: 1.4 10*3/uL (ref 0.9–3.3)
LYMPHS PCT: 20 %
MCH: 31.5 pg (ref 25.1–34.0)
MCHC: 34 g/dL (ref 31.5–36.0)
MCV: 92.9 fL (ref 79.5–101.0)
MONOS PCT: 5 %
Monocytes Absolute: 0.4 10*3/uL (ref 0.1–0.9)
NEUTROS ABS: 5 10*3/uL (ref 1.5–6.5)
NEUTROS PCT: 73 %
Platelet Count: 330 10*3/uL (ref 145–400)
RBC: 4.02 MIL/uL (ref 3.70–5.45)
RDW: 12.4 % (ref 11.2–14.5)
WBC: 6.8 10*3/uL (ref 3.9–10.3)

## 2017-11-27 NOTE — Progress Notes (Signed)
[facility]  [phone] [phone] [place] Note   Patient Care Team: Maury Dus, MD as PCP - General (Family Medicine)   Date of Service:  11/27/2017  CHIEF COMPLAINTS/PURPOSE OF CONSULTATION:  Ductal carcinoma in situ (DCIS) of right breast  Oncology History   Cancer Staging Ductal carcinoma in situ (DCIS) of right breast Staging form: Breast, AJCC 8th Edition - Clinical stage from 11/19/2017: Stage 0 (cTis (DCIS), cN0, cM0, ER: Positive, PR: Positive, HER2: Not assessed ) - Signed by Truitt Merle, MD on 11/26/2017       Ductal carcinoma in situ (DCIS) of right breast   11/07/2017 Mammogram     Diagnostic Mammogram Right breast 11/07/17 IMPRESSION:  The results of the exam were reviewed with the patient.  The 3 cm are of grouped branching linear calcifications in the right breast lower inner quadrant middle depth are at moderate concern but not classic for malignancy.  A Stereotactic biopsy is recommended.      11/19/2017 Initial Biopsy    Diagnosis 11/19/17 Breast, right, needle core biopsy - DUCTAL CARCINOMA IN SITU WITH CALCIFICATIONS. - SEE COMMENT.       11/19/2017 Receptors her2    Estrogen Receptor: 90%, POSITIVE, STRONG STAINING INTENSITY Progesterone Receptor: 25%, POSITIVE, STRONG STAINING INTENSITY       11/19/2017 Initial Diagnosis    Ductal carcinoma in situ (DCIS) of right breast        HISTORY OF PRESENTING ILLNESS: 11/27/17 Melissa Mcclure 62 y.o. female is a here because of newly diagnosed right breast cancer. The patient presents to breast clinic today accompanied by her husband and daughter.  Her mass was found by screening mammogram which she gets yearly. She had only one abnormal mammogram due to dense tissue breast. She notes her biopsy went well and has some bruising at site.    Today the patient notes she has trigger finger in her right thumb and pain in her back from previous surgery. She is overall  able to be active and be independent.  She lives with her husband and has 3 children.    In the past the patient was diagnosed with hiatal hernia, acid reflux and heart murmur. She also has an abdominal hernia. She does not plan to do repair soon. She has HTN which is well controlled on medication. She has had orthopedic surgeries on her back and shoulder. Her maternal grandmother had ovarian cancer, her mother had colon cancer.  She had hysterectomy after menopause started due to continued bleeding. She started menopause in her late 40s and started hormonal replacements due to significant symptoms. She was on a estrogen mist then progressed to the Estradial patch. She reduced dose of patch after hysterectomy.      GYN HISTORY  Menarchal: 12-13 LMP: Late 61s Contraceptive: HRT: Yes, Estradial patch 0.0358m  ended 11/20/17 G3P3: first child at 89yo    MEDICAL HISTORY:  Past Medical History:  Diagnosis Date  . Acid reflux    GERD  . [place]   melanoma-left knee, right arm  . Chronic back pain   . Complication of anesthesia   . Ductal carcinoma in situ (DCIS) of right breast 10/2017  . Family history of adverse reaction to anesthesia    mother has PONV  . GERD (gastroesophageal reflux disease)   . H/O hiatal hernia   . Heart murmur    no problems  . Hypertension   . PONV (postoperative nausea and  vomiting)    "zofran works great  . Seasonal allergies   . SVD (spontaneous vaginal delivery)    x 1  . Vertigo     SURGICAL HISTORY: Past Surgical History:  Procedure Laterality Date  . ABDOMINAL HYSTERECTOMY    . ANTERIOR LAT LUMBAR FUSION Right 04/17/2013   Procedure: ANTERIOR LATERAL LUMBAR FUSION 3 LEVELS;  Surgeon: Erline Levine, MD;  Location: [facility];  Service: Neurosurgery;  Laterality: Right;  Right Lumbar Two-Three Lumbar Three-Four Lumbar Four-Five  Anterolateral Fusion  . [place]  . CESAREAN SECTION  1980  . COLONOSCOPY    . CYSTOSCOPY  N/A 06/26/2016   Procedure: CYSTOSCOPY;  Surgeon: Megan Salon, MD;  Location: [facility];  Service: Gynecology;  Laterality: N/A;  . DILATATION & CURETTAGE/HYSTEROSCOPY WITH MYOSURE N/A 01/24/2016   Procedure: DILATATION & CURETTAGE/HYSTEROSCOPY WITH MYOSURE;  Surgeon: Megan Salon, MD;  Location: [facility];  Service: Gynecology;  Laterality: N/A;  . FOOT SURGERY Right    early 20s-bone spur removal  . LAPAROSCOPIC HYSTERECTOMY Bilateral 06/26/2016   Procedure: HYSTERECTOMY TOTAL LAPAROSCOPIC;  Surgeon: Megan Salon, MD;  Location: [facility];  Service: Gynecology;  Laterality: Bilateral;  . LUMBAR PERCUTANEOUS PEDICLE SCREW 3 LEVEL N/A 04/17/2013   Procedure: LUMBAR PERCUTANEOUS PEDICLE SCREW 3 LEVEL;  Surgeon: Erline Levine, MD;  Location: [facility];  Service: Neurosurgery;  Laterality: N/A;  Decompression and Posterior Lumbar Interbody Fusion of Lumbar Five-Sacral One, Percutaneous Screws Lumbar Two through Sacral One  . MELANOMA EXCISION     knee 08 lft. elbow 11rt  . SALPINGOOPHORECTOMY Bilateral 06/26/2016   Procedure: SALPINGO OOPHORECTOMY;  Surgeon: Megan Salon, MD;  Location: [facility];  Service: Gynecology;  Laterality: Bilateral;  . SHOULDER SURGERY Right 04/28/2015  . TUBAL LIGATION  1986  . WLE  06/17/07   and left groin lymph node    SOCIAL HISTORY: Social History   Socioeconomic History  . Marital status: Married    Spouse name: Not on file  . Number of children: Not on file  . Years of education: Not on file  . Highest education level: Not on file  Social Needs  . Financial resource strain: Not on file  . Food insecurity - worry: Not on file  . Food insecurity - inability: Not on file  . Transportation needs - medical: Not on file  . Transportation needs - non-medical: Not on file  Occupational History  . Not on file  Tobacco Use  . Smoking status: Never Smoker  . Smokeless tobacco: Never Used  Substance and Sexual Activity  . Alcohol use: Yes    Alcohol/week: 0.0 oz     Comment: ocassional wine  . Drug use: No  . Sexual activity: Yes    Partners: Male    Birth control/protection: Surgical, Post-menopausal    Comment: BTL  Other Topics Concern  . Not on file  Social History Narrative  . Not on file    FAMILY HISTORY: Family History  Problem Relation Age of Onset  . Diabetes Father        borderline  . Melanoma Father   . Congestive Heart Failure Father   . Colon cancer Mother   . Hypertension Mother   . Osteoporosis Mother   . Cancer Maternal Grandmother        "female"  . Ovarian cancer Maternal Grandmother   . Heart disease Paternal Grandfather     ALLERGIES:  is allergic to sulfur.  MEDICATIONS:  Current  Outpatient Medications  Medication Sig Dispense Refill  . acetaminophen (TYLENOL) 500 MG tablet Take 1,000 mg by mouth every 6 (six) hours as needed for mild pain. Reported on 01/23/2016    . amitriptyline (ELAVIL) 50 MG tablet Take 1 tablet by mouth at bedtime. Reported on 01/23/2016    . Ascorbic Acid (VITAMIN C WITH ROSE HIPS) 1000 MG tablet Take 1,000 mg by mouth daily. Reported on 01/23/2016    . Calcium 500-100 MG-UNIT CHEW Chew by mouth daily.    . cetirizine (ZYRTEC) 10 MG tablet Take 10 mg by mouth daily. Reported on 01/23/2016    . Echin-Gldnseal-Gnsng-RsHp-Zn-C (V-R IMMUNE SUPPORT COMPLEX PO) Take 1 tablet by mouth daily.    . famotidine (PEPCID) 10 MG tablet Take 10 mg by mouth daily as needed for heartburn. Reported on 01/23/2016    . FIBER COMPLETE PO Take by mouth daily.    Marland Kitchen gabapentin (NEURONTIN) 100 MG capsule Take 1 capsule (100 mg total) by mouth at bedtime. Can increase to two capsules at night. 60 capsule 1  . ibuprofen (ADVIL,MOTRIN) 200 MG tablet Take 4 tablets (800 mg total) by mouth every 6 (six) hours as needed for moderate pain. Reported on 01/23/2016 30 tablet 0  . Lactobacillus Rhamnosus, GG, (CULTURELLE PO) Take 1 capsule by mouth daily.    Marland Kitchen losartan-hydrochlorothiazide (HYZAAR) 100-12.5 MG per tablet Take 1 tablet  by mouth daily. Reported on 01/23/2016    . meclizine (ANTIVERT) 25 MG tablet Take 25 mg by mouth 3 (three) times daily as needed for dizziness. Reported on 01/23/2016    . Multiple Vitamin (MULTIVITAMIN WITH MINERALS) TABS tablet Take 1 tablet by mouth daily. Reported on 01/23/2016    . naproxen (NAPROSYN) 500 MG tablet Take 1 tablet (500 mg total) by mouth 2 (two) times daily. 30 tablet 0  . ondansetron (ZOFRAN ODT) 4 MG disintegrating tablet '4mg'$  ODT q4 hours prn nausea/vomit 10 tablet 0  . pantoprazole (PROTONIX) 40 MG tablet Take 40 mg by mouth daily. Reported on 01/23/2016    . polyethylene glycol (MIRALAX / GLYCOLAX) packet Take 8.5 g by mouth daily as needed for mild constipation. Reported on 01/23/2016    . tizanidine (ZANAFLEX) 2 MG capsule Take 4 mg by mouth at bedtime as needed for muscle spasms. Reported on 01/23/2016    . traMADol (ULTRAM) 50 MG tablet Take 1 tablet by mouth as needed.     No current facility-administered medications for this visit.    REVIEW OF SYSTEMS:   Constitutional: Denies fevers, chills or abnormal night sweats Eyes: Denies blurriness of vision, double vision or watery eyes Ears, nose, mouth, throat, and face: Denies mucositis or sore throat Respiratory: Denies cough, dyspnea or wheezes Cardiovascular: Denies palpitation, chest discomfort or lower extremity swelling Gastrointestinal:  Denies nausea, heartburn or change in bowel habits Skin: Denies abnormal skin rashes MSK: (+) trigger finger of right thumb (+) chronic back pain Lymphatics: Denies new lymphadenopathy or easy bruising Neurological:Denies numbness, tingling or new weaknesses Behavioral/Psych: Mood is stable, no new changes  All other systems were reviewed with the patient and are negative.  PHYSICAL EXAMINATION: ECOG PERFORMANCE STATUS: 0 - Asymptomatic  Vitals:   11/27/17 1240  BP: (!) 156/93  Pulse: 81  Resp: 18  Temp: 98 F (36.7 C)  SpO2: 100%   Filed Weights   11/27/17 1240  Weight:  116 lb 8 oz (52.8 kg)    GENERAL:alert, no distress and comfortable SKIN: skin color, texture, turgor are normal, no rashes  or significant lesions EYES: normal, conjunctiva are pink and non-injected, sclera clear OROPHARYNX:no exudate, no erythema and lips, buccal mucosa, and tongue normal  NECK: supple, thyroid normal size, non-tender, without nodularity LYMPH:  no palpable lymphadenopathy in the cervical, axillary or inguinal LUNGS: clear to auscultation and percussion with normal breathing effort HEART: regular rate & rhythm and no murmurs and no lower extremity edema ABDOMEN:abdomen soft, non-tender and normal bowel sounds Musculoskeletal:no cyanosis of digits and no clubbing  PSYCH: alert & oriented x 3 with fluent speech NEURO: no focal motor/sensory deficits BREAST: ecchymosis at biopsy site (+) multiple small lumps in upper outer quadrant of right breast, likely benign breast tissue, non-tender  LABORATORY DATA:  I have reviewed the data as listed CBC Latest Ref Rng & Units 11/27/2017 06/27/2016 06/12/2016  WBC 3.9 - 10.3 K/uL 6.8 9.5 8.9  Hemoglobin 12.0 - 15.0 g/dL - 10.6(L) 12.3  Hematocrit 34.8 - 46.6 % 37.3 30.6(L) 35.7(L)  Platelets 145 - 400 K/uL 330 249 283    CMP Latest Ref Rng & Units 11/27/2017 06/27/2016 06/12/2016  Glucose 70 - 140 mg/dL 75 112(H) 87  BUN 7 - 26 mg/dL 22 24(H) 25(H)  Creatinine 0.60 - 1.10 mg/dL 0.85 0.89 0.85  Sodium 136 - 145 mmol/L 139 137 134(L)  Potassium 3.5 - 5.1 mmol/L 3.8 3.6 3.7  Chloride 98 - 109 mmol/L 101 103 100(L)  CO2 22 - 29 mmol/L _0 Calcium 8.4 - 10.4 mg/dL 10.0 8.5(L) 9.2  Total Protein 6.4 - 8.3 g/dL 7.3 - -  Total Bilirubin 0.2 - 1.2 mg/dL 0.4 - -  Alkaline Phos 40 - 150 U/L 76 - -  AST 5 - 34 U/L 20 - -  ALT 0 - 55 U/L 20 - -    PATHOLOGY  Diagnosis 11/19/17 Breast, right, needle core biopsy - DUCTAL CARCINOMA IN SITU WITH CALCIFICATIONS. - SEE COMMENT. Microscopic Comment The carcinoma appears intermediate  grade. Estrogen receptor and progesterone receptor studies will be performed and the results reported separately The results were called to [facility] on 11/20/2017. (JBK:kh 11/20/17). PROGNOSTIC INDICATORS Results: IMMUNOHISTOCHEMICAL AND MORPHOMETRIC ANALYSIS PERFORMED MANUALLY Estrogen Receptor: 90%, POSITIVE, STRONG STAINING INTENSITY Progesterone Receptor: 25%, POSITIVE, STRONG STAINING INTENSITY   RADIOGRAPHIC STUDIES: I have personally reviewed the radiological images as listed and agreed with the findings in the report. No results found.   Diagnostic Mammogram Right breast 11/07/17 IMPRESSION:  The results of the exam were reviewed with the patient.  The 3 cm are of grouped branching linear calcifications in the right breast lower inner quadrant middle depth are at moderate concern but not classic for malignancy.  A Stereotactic biopsy is recommended.    Bone Density Scan 10/30/17  NORMAL Lowest site measured: Left Femoral Neck  T-Score: -0.4  ASSESSMENT & PLAN:  Melissa Mcclure is a 62 y.o. Caucasian female with a history of  GERD, H/o Melanoma of the left knee and right arm, Chronic back pain, heart murmur, HTN, and vertigo.   1. Ductal carcinoma in situ (DCIS) of right breast, ER/PR Positive, Grade 2 --We discussed her image findings and the biopsy results in great details. She has a 3 cm mass-non invasive cancer.  -We discussed that standard care for DCIS is surgical resection, which will cure her disease.  However most woman with DCIS would not progress to invasive cancer. -Given her intermediate grade disease and small mass, she meets the criteria for the COMETclinical trial, which will randomized patient to surgery  versus observation.  We offered her the opportunity to participate the COMET study. She has declined clinical trail and chose to proceed with standard treatment. -She is a candidate for breast conservation surgery. She has been seen by breast surgeon Dr.  Dalbert Batman, who recommends lumpectomy. -Her DCIS will be cured by complete surgical resection. Any form of adjuvant therapy is preventive.  -This includes adjuvant radiation to reduce local recurrence. She was also seen by radiation oncologist Dr. Lisbeth Renshaw today.   -Giving the strong ER and PR expression in her postmenopausal status, I discussed the option of adjuvant endocrine therapy with Tamoxifen, Raloxifene or Anastrozole for a total of 5 years, which decrease her risk of future breast cancer by ~40%. I provided her with reading material on these medications. Potential benefits and side effects were discussed with patient and she is interested. -We also discussed that biopsy may have sampling limitation, we will review her surgical path, to see if she has any invasive carcinoma components. -We also discussed the breast cancer surveillance after her surgery. She will continue annual screening mammogram, self exam, and a routine office visit with lab and exam with [place]. -Labs reviewed with pt today and they are WNL -F/u after radiation   2. Genetic Counseling  -Given her moderate family history of cancer she is eligible for genetic testing. She is agreeable. -Will send genetic referral today  -I explained environmental elements and health problems that can increase risk of breast cancer to pt and her family.   3. HTN, history of skin melanoma -Follow-up with PCP  PLAN:  -Send Genetic Referral  -She will likely have lumpectomy soon -I'll see her after her breast radiation, or sooner if surgical pathology reveals invasive cancer.   No orders of the defined types were placed in this encounter.   All questions were answered. The patient knows to call the clinic with any problems, questions or concerns. I spent 40 minutes counseling the patient face to face. The total time spent in the appointment was 55 minutes and more than 50% was on counseling.     Truitt Merle, MD 11/27/2017 5:56 PM  This  document serves as a record of services personally performed by Truitt Merle, MD. It was created on her behalf by Joslyn Devon, a trained medical scribe. The creation of this record is based on the scribe's personal observations and the provider's statements to them.    I have reviewed the above documentation for accuracy and completeness, and I agree with the above.

## 2017-11-27 NOTE — Progress Notes (Signed)
Nutrition Assessment  Reason for Assessment:  Pt seen in Breast Clinic  ASSESSMENT:  62 year old female with new diagnosis of breast cancer. Past medical history reviewed.  Patient reports normal appetite.  Medications:  reviewed  Labs: reviewed  Anthropometrics:   Height: 62.5 inches Weight: 116 lb 8 oz BMI: 20   NUTRITION DIAGNOSIS: Food and nutrition related knowledge deficit related to new diagnosis of breast cancer as evidenced by no prior need for nutrition related information.  INTERVENTION:   Discussed and provided packet of information regarding nutritional tips for breast cancer patients.  Questions answered.  Teachback method used.  Contact information provided and patient knows to contact me with questions/concerns.    MONITORING, EVALUATION, and GOAL: Pt will consume a healthy plant based diet to maintain lean body mass throughout treatment.   Royelle Hinchman B. Zenia Resides, [place], [place] Registered Dietitian [phone] (pager)

## 2017-11-27 NOTE — Progress Notes (Signed)
Radiation Oncology         [phone] ________________________________  Name: Melissa Mcclure        MRN: [id number]  Date of Service: 11/27/2017 DOB: 1956/02/01  TO:IZTIW, Herbie [place], MD  Fanny Skates, MD     REFERRING PHYSICIAN: Fanny Skates, MD   DIAGNOSIS: The encounter diagnosis was Ductal carcinoma in situ (DCIS) of right breast.   HISTORY OF PRESENT ILLNESS: Melissa Mcclure is a 62 y.o. female seen in the multidisciplinary breast clinic for a new diagnosis of right breast cancer. The patient was noted to have screening detected calcifications in the right breast on mammography. Diagnostic imaging revealed a 3 cm span of calcifications, and her axilla was negative for adenopathy by ultrasound. A Tomo guided biopsy on 10/30/17 revealed an intermediate grade, ER/PR positive DCIS. She comes today to discuss options of treatment for her cancer.   PREVIOUS RADIATION THERAPY: No   PAST MEDICAL HISTORY:  Past Medical History:  Diagnosis Date  . Acid reflux    GERD  . Cancer ([place]) 08,11   melanoma-left knee, right arm  . Chronic back pain   . Complication of anesthesia   . Ductal carcinoma in situ (DCIS) of right breast 10/2017  . Family history of adverse reaction to anesthesia    mother has PONV  . GERD (gastroesophageal reflux disease)   . H/O hiatal hernia   . Heart murmur    no problems  . Hypertension   . PONV (postoperative nausea and vomiting)    "zofran works great  . Seasonal allergies   . SVD (spontaneous vaginal delivery)    x 1  . Vertigo        PAST SURGICAL HISTORY: Past Surgical History:  Procedure Laterality Date  . ABDOMINAL HYSTERECTOMY    . ANTERIOR LAT LUMBAR FUSION Right 04/17/2013   Procedure: ANTERIOR LATERAL LUMBAR FUSION 3 LEVELS;  Surgeon: Erline Levine, MD;  Location: [facility];  Service: Neurosurgery;  Laterality: Right;  Right Lumbar Two-Three Lumbar Three-Four Lumbar Four-Five  Anterolateral Fusion  . [place]  . CESAREAN  SECTION  1980  . COLONOSCOPY    . CYSTOSCOPY N/A 06/26/2016   Procedure: CYSTOSCOPY;  Surgeon: Megan Salon, MD;  Location: [facility];  Service: Gynecology;  Laterality: N/A;  . DILATATION & CURETTAGE/HYSTEROSCOPY WITH MYOSURE N/A 01/24/2016   Procedure: DILATATION & CURETTAGE/HYSTEROSCOPY WITH MYOSURE;  Surgeon: Megan Salon, MD;  Location: [facility];  Service: Gynecology;  Laterality: N/A;  . FOOT SURGERY Right    early 20s-bone spur removal  . LAPAROSCOPIC HYSTERECTOMY Bilateral 06/26/2016   Procedure: HYSTERECTOMY TOTAL LAPAROSCOPIC;  Surgeon: Megan Salon, MD;  Location: [facility];  Service: Gynecology;  Laterality: Bilateral;  . LUMBAR PERCUTANEOUS PEDICLE SCREW 3 LEVEL N/A 04/17/2013   Procedure: LUMBAR PERCUTANEOUS PEDICLE SCREW 3 LEVEL;  Surgeon: Erline Levine, MD;  Location: [facility];  Service: Neurosurgery;  Laterality: N/A;  Decompression and Posterior Lumbar Interbody Fusion of Lumbar Five-Sacral One, Percutaneous Screws Lumbar Two through Sacral One  . MELANOMA EXCISION     knee 08 lft. elbow 11rt  . SALPINGOOPHORECTOMY Bilateral 06/26/2016   Procedure: SALPINGO OOPHORECTOMY;  Surgeon: Megan Salon, MD;  Location: [facility];  Service: Gynecology;  Laterality: Bilateral;  . SHOULDER SURGERY Right 04/28/2015  . TUBAL LIGATION  1986  . WLE  06/17/07   and left groin lymph node     FAMILY HISTORY:  Family History  Problem Relation Age of Onset  .  Diabetes Father        borderline  . Melanoma Father   . Congestive Heart Failure Father   . Colon cancer Mother   . Hypertension Mother   . Osteoporosis Mother   . Cancer Maternal Grandmother        "female"  . Ovarian cancer Maternal Grandmother   . Heart disease Paternal Grandfather      SOCIAL HISTORY:  reports that  has never smoked. she has never used smokeless tobacco. She reports that she drinks alcohol. She reports that she does not use drugs. The patient is married and lives in [place]. She is retired.    ALLERGIES:  Sulfur   MEDICATIONS:  Current Outpatient Medications  Medication Sig Dispense Refill  . acetaminophen (TYLENOL) 500 MG tablet Take 1,000 mg by mouth every 6 (six) hours as needed for mild pain. Reported on 01/23/2016    . amitriptyline (ELAVIL) 50 MG tablet Take 1 tablet by mouth at bedtime. Reported on 01/23/2016    . Ascorbic Acid (VITAMIN C WITH ROSE HIPS) 1000 MG tablet Take 1,000 mg by mouth daily. Reported on 01/23/2016    . Calcium 500-100 MG-UNIT CHEW Chew by mouth daily.    . cetirizine (ZYRTEC) 10 MG tablet Take 10 mg by mouth daily. Reported on 01/23/2016    . Echin-Gldnseal-Gnsng-RsHp-Zn-C (V-R IMMUNE SUPPORT COMPLEX PO) Take 1 tablet by mouth daily.    . famotidine (PEPCID) 10 MG tablet Take 10 mg by mouth daily as needed for heartburn. Reported on 01/23/2016    . FIBER COMPLETE PO Take by mouth daily.    Marland Kitchen gabapentin (NEURONTIN) 100 MG capsule Take 1 capsule (100 mg total) by mouth at bedtime. Can increase to two capsules at night. 60 capsule 1  . ibuprofen (ADVIL,MOTRIN) 200 MG tablet Take 4 tablets (800 mg total) by mouth every 6 (six) hours as needed for moderate pain. Reported on 01/23/2016 30 tablet 0  . Lactobacillus Rhamnosus, GG, (CULTURELLE PO) Take 1 capsule by mouth daily.    Marland Kitchen losartan-hydrochlorothiazide (HYZAAR) 100-12.5 MG per tablet Take 1 tablet by mouth daily. Reported on 01/23/2016    . meclizine (ANTIVERT) 25 MG tablet Take 25 mg by mouth 3 (three) times daily as needed for dizziness. Reported on 01/23/2016    . Multiple Vitamin (MULTIVITAMIN WITH MINERALS) TABS tablet Take 1 tablet by mouth daily. Reported on 01/23/2016    . naproxen (NAPROSYN) 500 MG tablet Take 1 tablet (500 mg total) by mouth 2 (two) times daily. 30 tablet 0  . ondansetron (ZOFRAN ODT) 4 MG disintegrating tablet 4mg  ODT q4 hours prn nausea/vomit 10 tablet 0  . pantoprazole (PROTONIX) 40 MG tablet Take 40 mg by mouth daily. Reported on 01/23/2016    . polyethylene glycol (MIRALAX / GLYCOLAX) packet Take 8.5 g  by mouth daily as needed for mild constipation. Reported on 01/23/2016    . tizanidine (ZANAFLEX) 2 MG capsule Take 4 mg by mouth at bedtime as needed for muscle spasms. Reported on 01/23/2016    . traMADol (ULTRAM) 50 MG tablet Take 1 tablet by mouth as needed.     No current facility-administered medications for this encounter.      REVIEW OF SYSTEMS: On review of systems, the patient reports that she is doing well overall. She denies any chest pain, shortness of breath, cough, fevers, chills, night sweats, unintended weight changes. She denies any bowel or bladder disturbances, and denies abdominal pain, nausea or vomiting. She denies any new musculoskeletal or joint  aches or pains. A complete review of systems is obtained and is otherwise negative.     PHYSICAL EXAM:  Wt Readings from Last 3 Encounters:  11/27/17 116 lb 8 oz (52.8 kg)  11/21/17 119 lb (54 kg)  04/30/17 142 lb (64.4 kg)   Temp Readings from Last 3 Encounters:  11/27/17 98 F (36.7 C) (Oral)  07/06/16 98.4 F (36.9 C) (Oral)  06/27/16 98.5 F (36.9 C) (Oral)   BP Readings from Last 3 Encounters:  11/27/17 (!) 156/93  11/21/17 118/82  04/30/17 112/80   Pulse Readings from Last 3 Encounters:  11/27/17 81  11/21/17 92  04/30/17 80     In general this is a well appearing caucasian female in no acute distress. She is alert and oriented x4 and appropriate throughout the examination. HEENT reveals that the patient is normocephalic, atraumatic. EOMs are intact. PERRLA. Skin is intact without any evidence of gross lesions. Cardiovascular exam reveals a regular rate and rhythm, no clicks rubs or murmurs are auscultated. Chest is clear to auscultation bilaterally. Lymphatic assessment is performed and does not reveal any adenopathy in the cervical, supraclavicular, axillary, or inguinal chains. Bilateral breast exam is performed and reveals dense tissue and fullness deep to the biopsy site on the right breast with  ecchymosis. No mass is noted of the left breast, and no nipple bleeding or discharge is noted of either breast. Abdomen has active bowel sounds in all quadrants and is intact. The abdomen is soft, non tender, non distended. Lower extremities are negative for pretibial pitting edema, deep calf tenderness, cyanosis or clubbing.   ECOG = 0  0 - Asymptomatic (Fully active, able to carry on all predisease activities without restriction)  1 - Symptomatic but completely ambulatory (Restricted in physically strenuous activity but ambulatory and able to carry out work of a light or sedentary nature. For example, light housework, office work)  2 - Symptomatic, <50% in bed during the day (Ambulatory and capable of all self care but unable to carry out any work activities. Up and about more than 50% of waking hours)  3 - Symptomatic, >50% in bed, but not bedbound (Capable of only limited self-care, confined to bed or chair 50% or more of waking hours)  4 - Bedbound (Completely disabled. Cannot carry on any self-care. Totally confined to bed or chair)  5 - Death   Eustace Pen MM, Creech RH, Tormey DC, et al. [phone]). "Toxicity and response criteria of the [facility]". [place] Oncol. 5 (6): 649-55    LABORATORY DATA:  Lab Results  Component Value Date   WBC 6.8 11/27/2017   HGB 10.6 (L) 06/27/2016   HCT 37.3 11/27/2017   MCV 92.9 11/27/2017   PLT 330 11/27/2017   Lab Results  Component Value Date   NA 139 11/27/2017   K 3.8 11/27/2017   CL 101 11/27/2017   CO2 28 11/27/2017   Lab Results  Component Value Date   ALT 20 11/27/2017   AST 20 11/27/2017   ALKPHOS 76 11/27/2017   BILITOT 0.4 11/27/2017      RADIOGRAPHY: No results found.     IMPRESSION/PLAN: 1. Intermediate grade, ER/PR positive DCIS of the right breast. Dr. Lisbeth Renshaw discusses the pathology findings and reviews the nature of non invasive breast disease. The consensus from the breast conference includes  either COMET Trial versus breast conservation with lumpectomy, followed by external radiotherapy to the breast followed by antiestrogen therapy. The patient is leaning towards  treatment. We discussed the risks, benefits, short, and long term effects of radiotherapy, and the patient is interested in proceeding. Dr. Lisbeth Renshaw discusses the delivery and logistics of radiotherapy and anticipates a course of 4 weeks. We will see her back about 2 weeks after surgery to move forward with the simulation and planning process and anticipate starting radiotherapy about 4 weeks after surgery.  2. Possible genetic predisposition to malignancy. The patient is a candidate for genetic testing and will be referred for testing.  The above documentation reflects my direct findings during this shared patient visit. Please see the separate note by Dr. Lisbeth Renshaw on this date for the remainder of the patient's plan of care.    Carola Rhine, PAC

## 2017-11-27 NOTE — Progress Notes (Signed)
[facility] Psychosocial Distress Screening Melissa Mcclure presented to Breast Multidisciplinary Clinic, reviewing distress screen per protocol.  The patient scored a 5 on the Psychosocial Distress Thermometer which indicates moderate distress.   ONCBCN DISTRESS SCREENING 11/27/2017  Screening Type Initial Screening  Distress experienced in past week (1-10) 5  Referral to support programs Yes   Per RN, Melissa Reinecke' distress has decreased since attending [facility], meeting team, and learning scope of dx/tx plan; pt reports no other concerns at this time.  Follow up needed: No. Melissa Mcclure has full packet of [place] team/programming resources and is aware of ongoing team availability, but please also page if immediate needs arise or circumstances change. Thank you.   [place], [place], [facility] Pager [phone] Voicemail [phone]

## 2017-11-28 ENCOUNTER — Other Ambulatory Visit: Payer: Self-pay | Admitting: General Surgery

## 2017-11-28 DIAGNOSIS — C50311 Malignant neoplasm of lower-inner quadrant of right female breast: Secondary | ICD-10-CM

## 2017-12-03 ENCOUNTER — Other Ambulatory Visit: Payer: Self-pay | Admitting: *Deleted

## 2017-12-03 DIAGNOSIS — D0511 Intraductal carcinoma in situ of right breast: Secondary | ICD-10-CM

## 2017-12-04 ENCOUNTER — Encounter: Payer: Self-pay | Admitting: Radiation Oncology

## 2017-12-05 ENCOUNTER — Telehealth: Payer: Self-pay | Admitting: *Deleted

## 2017-12-05 NOTE — Telephone Encounter (Signed)
Spoke to pt concerning Melissa Mcclure from 11/27/17. Denies questions or concerns regarding dx or treatment care plan. Encourage pt to call with need. Received verbal understanding.

## 2017-12-12 ENCOUNTER — Inpatient Hospital Stay: Payer: BLUE CROSS/BLUE SHIELD

## 2017-12-12 ENCOUNTER — Encounter: Payer: Self-pay | Admitting: Genetic Counselor

## 2017-12-12 ENCOUNTER — Inpatient Hospital Stay (HOSPITAL_BASED_OUTPATIENT_CLINIC_OR_DEPARTMENT_OTHER): Payer: BLUE CROSS/BLUE SHIELD | Admitting: Genetic Counselor

## 2017-12-12 DIAGNOSIS — Z800 Family history of malignant neoplasm of digestive organs: Secondary | ICD-10-CM | POA: Insufficient documentation

## 2017-12-12 DIAGNOSIS — Z8041 Family history of malignant neoplasm of ovary: Secondary | ICD-10-CM | POA: Diagnosis not present

## 2017-12-12 DIAGNOSIS — Z808 Family history of malignant neoplasm of other organs or systems: Secondary | ICD-10-CM

## 2017-12-12 DIAGNOSIS — Z1379 Encounter for other screening for genetic and chromosomal anomalies: Secondary | ICD-10-CM | POA: Diagnosis not present

## 2017-12-12 NOTE — Progress Notes (Signed)
[facility]      Initial Visit   Patient Name: Melissa Mcclure Patient DOB: 12-Mar-1956 Patient Age: 62 y.o. Encounter Date: 12/12/2017  Referring Provider: Truitt Merle, MD  Primary Care Provider: Maury Dus, MD  Reason for Visit: Evaluate for hereditary susceptibility to cancer    Assessment and Plan:  . Melissa Mcclure' history is not highly suggestive of a hereditary predisposition to cancer, but given the combination of her own breast cancer and melanoma as well as paucity of women in her father's family, a genetics evaluation is indicated.   . Testing is recommended to determine whether she has a pathogenic mutation that will impact her screening and risk-reduction for cancer. A negative result will be reassuring.  . Melissa Mcclure wished to pursue genetic testing and a blood sample will be sent to [facility] for analysis. [facility]'s STAT breast panel was requested as it will impact surgical decisions. Results should be available in about 7-12 days. The 9 genes on this panel are ATM, BRCA1, BRCA2, CDH1, CHEK2, PALB2, PTEN, STK11, TP53. Once this test is complete, analysis of additional genes on a larger hereditary cancer panel will proceed. She will be called after each result is obtained.   Dr. Burr Medico was available for questions concerning this case. Total time spent by me in face-to-face counseling was approximately 30 minutes.   _____________________________________________________________________   History of Present Illness: Melissa Mcclure, a 62 y.o. female, is being seen at the [facility] due to a personal and family history of cancer. She presents to clinic today to discuss the possibility of a hereditary predisposition to cancer and discuss whether genetic testing is warranted.  Melissa Mcclure was recently diagnosed with breast cancer at the age of 5. She indicated that she may be using results of genetic testing to decide which surgery to  have, but is already scheduled for a lumpectomy on 12/25/17. Results are likely to be available by then.   She also has a history of melanoma on her left knee at age 25 and her right upper arm at age 1.  Oncology History   Cancer Staging Ductal carcinoma in situ (DCIS) of right breast Staging form: Breast, AJCC 8th Edition - Clinical stage from 11/19/2017: Stage 0 (cTis (DCIS), cN0, cM0, ER: Positive, PR: Positive, HER2: Not assessed ) - Signed by Truitt Merle, MD on 11/26/2017       Ductal carcinoma in situ (DCIS) of right breast   11/07/2017 Mammogram     Diagnostic Mammogram Right breast 11/07/17 IMPRESSION:  The results of the exam were reviewed with the patient.  The 3 cm are of grouped branching linear calcifications in the right breast lower inner quadrant middle depth are at moderate concern but not classic for malignancy.  A Stereotactic biopsy is recommended.      11/19/2017 Initial Biopsy    Diagnosis 11/19/17 Breast, right, needle core biopsy - DUCTAL CARCINOMA IN SITU WITH CALCIFICATIONS. - SEE COMMENT.       11/19/2017 Receptors her2    Estrogen Receptor: 90%, POSITIVE, STRONG STAINING INTENSITY Progesterone Receptor: 25%, POSITIVE, STRONG STAINING INTENSITY       11/19/2017 Initial Diagnosis    Ductal carcinoma in situ (DCIS) of right breast       Past Medical History:  Diagnosis Date  . Acid reflux    GERD  . Cancer ([place]) 08,11   melanoma-left knee, right arm  . Chronic back pain   .  Complication of anesthesia   . Ductal carcinoma in situ (DCIS) of right breast 10/2017  . Family history of adverse reaction to anesthesia    mother has PONV  . Family history of colon cancer   . Family history of melanoma   . Family history of ovarian cancer   . GERD (gastroesophageal reflux disease)   . H/O hiatal hernia   . Heart murmur    no problems  . Hypertension   . PONV (postoperative nausea and vomiting)    "zofran works great  . Seasonal allergies   . SVD  (spontaneous vaginal delivery)    x 1  . Vertigo     Past Surgical History:  Procedure Laterality Date  . ABDOMINAL HYSTERECTOMY    . ANTERIOR LAT LUMBAR FUSION Right 04/17/2013   Procedure: ANTERIOR LATERAL LUMBAR FUSION 3 LEVELS;  Surgeon: Erline Levine, MD;  Location: [facility];  Service: Neurosurgery;  Laterality: Right;  Right Lumbar Two-Three Lumbar Three-Four Lumbar Four-Five  Anterolateral Fusion  . [place]  . CESAREAN SECTION  1980  . COLONOSCOPY    . CYSTOSCOPY N/A 06/26/2016   Procedure: CYSTOSCOPY;  Surgeon: Megan Salon, MD;  Location: [facility];  Service: Gynecology;  Laterality: N/A;  . DILATATION & CURETTAGE/HYSTEROSCOPY WITH MYOSURE N/A 01/24/2016   Procedure: DILATATION & CURETTAGE/HYSTEROSCOPY WITH MYOSURE;  Surgeon: Megan Salon, MD;  Location: [facility];  Service: Gynecology;  Laterality: N/A;  . FOOT SURGERY Right    early 20s-bone spur removal  . LAPAROSCOPIC HYSTERECTOMY Bilateral 06/26/2016   Procedure: HYSTERECTOMY TOTAL LAPAROSCOPIC;  Surgeon: Megan Salon, MD;  Location: [facility];  Service: Gynecology;  Laterality: Bilateral;  . LUMBAR PERCUTANEOUS PEDICLE SCREW 3 LEVEL N/A 04/17/2013   Procedure: LUMBAR PERCUTANEOUS PEDICLE SCREW 3 LEVEL;  Surgeon: Erline Levine, MD;  Location: [facility];  Service: Neurosurgery;  Laterality: N/A;  Decompression and Posterior Lumbar Interbody Fusion of Lumbar Five-Sacral One, Percutaneous Screws Lumbar Two through Sacral One  . MELANOMA EXCISION     knee 08 lft. elbow 11rt  . SALPINGOOPHORECTOMY Bilateral 06/26/2016   Procedure: SALPINGO OOPHORECTOMY;  Surgeon: Megan Salon, MD;  Location: [facility];  Service: Gynecology;  Laterality: Bilateral;  . SHOULDER SURGERY Right 04/28/2015  . TUBAL LIGATION  1986  . WLE  06/17/07   and left groin lymph node    Social History   Socioeconomic History  . Marital status: Married    Spouse name: Not on file  . Number of children: Not on file  . Years of education: Not on file    . Highest education level: Not on file  Occupational History  . Not on file  Social Needs  . Financial resource strain: Not on file  . Food insecurity:    Worry: Not on file    Inability: Not on file  . Transportation needs:    Medical: Not on file    Non-medical: Not on file  Tobacco Use  . Smoking status: Never Smoker  . Smokeless tobacco: Never Used  Substance and Sexual Activity  . Alcohol use: Yes    Alcohol/week: 0.0 oz    Comment: ocassional wine  . Drug use: No  . Sexual activity: Yes    Partners: Male    Birth control/protection: Surgical, Post-menopausal    Comment: BTL  Lifestyle  . Physical activity:    Days per week: Not on file    Minutes per session: Not on file  . Stress: Not on  file  Relationships  . Social connections:    Talks on phone: Not on file    Gets together: Not on file    Attends religious service: Not on file    Active member of club or organization: Not on file    Attends meetings of clubs or organizations: Not on file    Relationship status: Not on file  Other Topics Concern  . Not on file  Social History Narrative  . Not on file     Family History:  During the visit, a 4-generation pedigree was obtained. Family tree will be scanned in the Media tab in Epic  Significant diagnoses include the following:  Family History  Problem Relation Age of Onset  . Diabetes Father        borderline  . Melanoma Father        dx 35s/60s; deceased 27s  . Congestive Heart Failure Father   . Colon cancer Mother        dx 68s; deceased 67  . Hypertension Mother   . Osteoporosis Mother   . Ovarian cancer Maternal Grandmother        deceased 38s  . Heart disease Paternal Grandfather   . Melanoma Daughter        on leg; dx 48s    Additionally, Melissa Mcclure has 2 daughters and a son. She has one sister (age 9) who has 2 sons. Her mother had only one brother. Her father had no siblings.  Melissa Mcclure ancestry is Caucasian - NOS. There is no  known Jewish ancestry and no consanguinity.  Discussion: We reviewed the characteristics, features and inheritance patterns of hereditary cancer syndromes. We discussed her risk of harboring a mutation in the context of her personal and family history. We discussed that her small family and paucity of women on her father's side make risk assessment challenging. We discussed the process of genetic testing, insurance coverage and implications of results: positive, negative and variant of unknown significance (VUS).    Melissa Mcclure questions were answered to her satisfaction today and she is welcome to call with any additional questions or concerns. Thank you for the referral and allowing [place] to share in the care of your patient.    Steele Berg, MS, [place] Certified Genetic Counselor phone: [phone] [email or web address]

## 2017-12-18 ENCOUNTER — Other Ambulatory Visit: Payer: Self-pay

## 2017-12-18 ENCOUNTER — Encounter (HOSPITAL_BASED_OUTPATIENT_CLINIC_OR_DEPARTMENT_OTHER): Payer: Self-pay | Admitting: *Deleted

## 2017-12-19 ENCOUNTER — Encounter (HOSPITAL_BASED_OUTPATIENT_CLINIC_OR_DEPARTMENT_OTHER)
Admission: RE | Admit: 2017-12-19 | Discharge: 2017-12-19 | Disposition: A | Discharge status: Home / Self Care | Payer: BLUE CROSS/BLUE SHIELD | Source: Ambulatory Visit | Attending: General Surgery | Admitting: General Surgery

## 2017-12-19 ENCOUNTER — Ambulatory Visit: Payer: Self-pay | Admitting: Genetic Counselor

## 2017-12-19 DIAGNOSIS — Z1379 Encounter for other screening for genetic and chromosomal anomalies: Secondary | ICD-10-CM | POA: Insufficient documentation

## 2017-12-19 NOTE — Progress Notes (Signed)
[facility]            Result Disclosure        Patient Name: Melissa Mcclure Patient DOB: 06/07/1956 Patient Age: 62 y.o. Encounter Date: 12/19/2017  Referring Provider: Truitt Merle, MD  Reason for Call: Discuss results of genetic testing- 1st of 2 results   This is a brief note to document preliminary genetic test results.  Ms. Cordell was called today to discuss the first of her genetic test results. Please see the Genetics note from her visit on 12/12/2017. Due to time contraints and needing actionable results for medical management, [facility]'s STAT Breast panel was ordered first.  Preliminary Test Results: Genetic testing involved analysis of 9 genes: ATM, BRCA1, BRCA2, CDH1, CHEK2, PALB2, PTEN, STK11 and TP53 genes. Testing was normal and did not reveal a mutation in these genes. Testing is in process for the remaining genes on the Multi-Cancer gene panel.   Once results are obtained, Ms. Berninger will be called again. She does not need to wait to proceed with surgery.  Steele Berg, MS, [place] Certified Genetic Counselor phone: [phone]

## 2017-12-19 NOTE — Progress Notes (Addendum)
Ensure pre surgery drink given with instructions to complete by 0445, pt verbalized understanding.

## 2017-12-23 ENCOUNTER — Ambulatory Visit: Payer: Self-pay | Admitting: Genetic Counselor

## 2017-12-23 ENCOUNTER — Encounter: Payer: Self-pay | Admitting: Genetic Counselor

## 2017-12-23 DIAGNOSIS — Z0000 Encounter for general adult medical examination without abnormal findings: Secondary | ICD-10-CM | POA: Insufficient documentation

## 2017-12-23 DIAGNOSIS — Z5181 Encounter for therapeutic drug level monitoring: Secondary | ICD-10-CM | POA: Insufficient documentation

## 2017-12-23 DIAGNOSIS — Z1379 Encounter for other screening for genetic and chromosomal anomalies: Secondary | ICD-10-CM

## 2017-12-23 HISTORY — DX: Encounter for other screening for genetic and chromosomal anomalies: Z13.79

## 2017-12-23 NOTE — H&P (Signed)
Melissa Mcclure Location: [facility] Patient #: [id number] DOB: 1956-05-29 Undefined / Language: Cleophus Molt / Race: White Female        History of Present Illness       The patient is a 62 year old female who presents with breast cancer. This is a 62 year old female, referred by Dr. Marcelo Baldy at [facility] mammography for evaluation of ductal carcinoma in situ right breast, lower inner quadrant. She is seen in the [facility] today by Dr. Burr Medico, Dr. Lisbeth Renshaw, and me. Dr. Maury Dus is her PCP. Dr. Janeann Merl is her gynecologist.       She's had no prior breast problems. Gets annual screening. Recent mammography shows a 3 cm area of calcifications without mass in the right breast, 4 o'clock position, middle depth. Ultrasound of the axilla is negative. Image guided biopsy shows intermediate grade DCIS, ER 90%, PR 25%.       Past history significant for hypertension. Melanoma left leg 2008 requiring wide local excision, skin grafting, lymphoscintigram and groin dissection. She subsequently had a very superficial melanoma of the right arm. Hiatal hernia. TAH and BSO. Lumbar laminectomy and fusion. Family history reveals mother died of old age. She was treated for colon cancer and hypertension. Father deceased had end-stage renal disease ,diabetes and heart failure. It was thought that the maternal grandmother may have had some type of gynecologic cancer but was never operated on. There is no family history of breast cancer. Social history reveals she is married with 3 children. Daughter and husband are with her throughout the encounter today. She took early retirement. Denies tobacco. Drinks alcohol occasionally.      We had a very long talk. Almost 1 hour. We talked about options for treatment. We talked about lumpectomy, radiation therapy, mastectomy with or without reconstruction. We talked about the COMET research trial for treatment with tamoxifen alone without surgery. At  this point in time she thinks she wants to proceed with standard therapy which would be right breast lumpectomy and adjuvant radiation therapy and adjuvant antiestrogen therapy. I discussed the indications, details, techniques and numerous risk of right breast lumpectomy with RSL with her and her family. She is aware of the risk of bleeding, infection, cosmetic deformity, reoperation for positive margins, nerve damage with chronic pain, and other unforeseen problems. She understands these issues well. All her questions are answered. She agrees with this plan. She knows that she can change her mind and reconsider the common trial if she chooses.  She has been referred for genetic counseling  My office will call her to set up the surgery in the near future   Past Surgical History  Breast Biopsy  Right. Cesarean Section - Multiple  Foot Surgery  Right. Hysterectomy (not due to cancer) - Complete  Shoulder Surgery  Right. Spinal Surgery - Lower Back   Diagnostic Studies History  Colonoscopy  1-5 years ago Mammogram  within last year Pap Smear  1-5 years ago  Medication History  Medications Reconciled  Social History Alcohol use  Occasional alcohol use. Caffeine use  Coffee. No drug use  Tobacco use  Never smoker.  Family History  Alcohol Abuse  Father. Arthritis  Family Members In General. Cerebrovascular Accident  Family Members In General. Cervical Cancer  Family Members In [place]  Mother. Diabetes Mellitus  Father. Heart Disease  Family Members In General. Hypertension  Mother. Ischemic Bowel Disease  Mother. Kidney Disease  Father. Melanoma  Daughter, Father. Ovarian Cancer  Family Members In General.  Pregnancy / Birth History  Age at menarche  47 years. Age of menopause  78-50 Contraceptive History  Oral contraceptives. Gravida  3 Maternal age  24-20 Para  3  Other Problems  Arthritis  Back Pain   Gastroesophageal Reflux Disease  General anesthesia - complications  Heart murmur  Hemorrhoids  High blood pressure  Inguinal Hernia  Melanoma  Oophorectomy  Bilateral. Ventral Hernia Repair     Review of Systems  Skin Present- Dryness. Not Present- Change in Wart/Mole, Hives, Jaundice, New Lesions, Non-Healing Wounds, Rash and Ulcer. Gastrointestinal Present- Excessive gas and Indigestion. Not Present- Abdominal Pain, Bloating, Bloody Stool, Change in Bowel Habits, Chronic diarrhea, Constipation, Difficulty Swallowing, Gets full quickly at meals, Hemorrhoids, Nausea, Rectal Pain and Vomiting. Musculoskeletal Present- Back Pain. Not Present- Joint Pain, Joint Stiffness, Muscle Pain, Muscle Weakness and Swelling of Extremities. Hematology Present- Easy Bruising. Not Present- Blood Thinners, Excessive bleeding, Gland problems, HIV and Persistent Infections.   Physical Exam  General Mental Status-Alert. General Appearance-Consistent with stated age. Hydration-Well hydrated. Voice-Normal.  Head and Neck Head-normocephalic, atraumatic with no lesions or palpable masses. Trachea-midline. Thyroid Gland Characteristics - normal size and consistency.  Eye Eyeball - Bilateral-Extraocular movements intact. Sclera/Conjunctiva - Bilateral-No scleral icterus.  Chest and Lung Exam Chest and lung exam reveals -quiet, even and easy respiratory effort with no use of accessory muscles and on auscultation, normal breath sounds, no adventitious sounds and normal vocal resonance. Inspection Chest Wall - Normal. Back - normal.  Breast Note: Breast moderately large. 36D. Some ecchymoses right breast medially but no palpable mass in either breast. No other skin changes. No axillary adenopathy.   Cardiovascular Cardiovascular examination reveals -normal heart sounds, regular rate and rhythm with no murmurs and normal pedal pulses  bilaterally.  Abdomen Inspection Inspection of the abdomen reveals - No Hernias. Skin - Scar - no surgical scars. Palpation/Percussion Palpation and Percussion of the abdomen reveal - Soft, Non Tender, No Rebound tenderness, No Rigidity (guarding) and No hepatosplenomegaly. Auscultation Auscultation of the abdomen reveals - Bowel sounds normal.  Neurologic Neurologic evaluation reveals -alert and oriented x 3 with no impairment of recent or remote memory. Mental Status-Normal.  Musculoskeletal Normal Exam - Left-Upper Extremity Strength Normal and Lower Extremity Strength Normal. Normal Exam - Right-Upper Extremity Strength Normal and Lower Extremity Strength Normal.  Lymphatic Head & Neck  General Head & Neck Lymphatics: Bilateral - Description - Normal. Axillary  General Axillary Region: Bilateral - Description - Normal. Tenderness - Non Tender. Femoral & Inguinal  Generalized Femoral & Inguinal Lymphatics: Bilateral - Description - Normal. Tenderness - Non Tender.    Assessment & Plan PRIMARY CANCER OF LOWER-INNER QUADRANT OF RIGHT FEMALE BREAST (C50.311).   Your recent imaging studies and biopsies show a 3 cm area of calcifications in the right breast, lower inner quadrant, 4 o'clock position Ultrasound of your axilla is negative Biopsy of the right breast calcifications showed ductal carcinoma in situ, intermediate grade, estrogen receptor and progesterone receptor positive.  We have had a long discussion about your options. We have talked about lumpectomy, radiation therapy, antiestrogen therapy We have talked about the COMET research trial in which you are treated with antiestrogen therapy alone and followed without surgery At this point you state that you are strongly leaning toward surgery, and so is your daughter and husband. We will tentatively schedule you for right breast lumpectomy with radioactive seed localization If you change your mind we can  always cancel that surgery  We have discussed the indications, techniques, and risk of the surgery in detail  Following surgery you will probably receive whole breast radiation therapy to be followed by antiestrogen therapy.  You have been referred for genetic counseling    HISTORY OF MALIGNANT MELANOMA (Z85.820) HYPERTENSION, ESSENTIAL (I10) HISTORY OF TOTAL ABDOMINAL HYSTERECTOMY AND BILATERAL SALPINGO-OOPHORECTOMY (Z90.710) HISTORY OF BACK SURGERY (Z98.890) FAMILY HISTORY OF COLON CANCER (Z80.0)    Cornel Werber M. Dalbert Batman, M.D., [facility] General and Minimally invasive Surgery Breast and Colorectal Surgery Office:   [phone] Pager:   [phone]

## 2017-12-23 NOTE — Progress Notes (Signed)
[facility]       Genetic Test Results    Patient Name: Melissa Mcclure Patient DOB: [birth year] Patient Age: 62 y.o. Encounter Date: 12/23/2017  Referring Provider: Truitt Merle, MD  Primary Care Provider: Maury Dus, MD   Melissa Mcclure was called today to discuss genetic test results. Please see the Genetics note from her visit on 12/12/2017 for a detailed discussion of her personal and family history.  Genetic Testing: At the time of Melissa Mcclure' visit, she decided to pursue genetic testing of 9 genes that may be used to help guide treatment decisions. Once that test was completed, additional genes on a larger panel were analyzed. Testing which included sequencing and deletion/duplication analysis. Testing did not reveal a pathogenic mutation in any of the genes analyzed. A copy of the genetic test report will be scanned into Epic under the Media tab.  The genes analyzed were the 83 genes on [facility]'s Multi-Cancer panel (ALK, APC, ATM, AXIN2, BAP1, BARD1, BLM, BMPR1A, BRCA1, BRCA2, BRIP1, CASR, CDC73, CDH1, CDK4, CDKN1B, CDKN1C, CDKN2A, CEBPA, CHEK2, CTNNA1, DICER1, DIS3L2, EGFR, EPCAM, FH, FLCN, GATA2, GPC3, GREM1, HOXB13, HRAS, KIT, MAX, MEN1, MET, MITF, MLH1, MSH2, MSH3, MSH6, MUTYH, NBN, NF1, NF2, NTHL1, PALB2, PDGFRA, PHOX2B, PMS2, POLD1, POLE, POT1, PRKAR1A, PTCH1, PTEN, RAD50, RAD51C, RAD51D, RB1, RECQL4, RET, RUNX1, SDHA, SDHAF2, SDHB, SDHC, SDHD, SMAD4, SMARCA4, SMARCB1, SMARCE1, STK11, SUFU, TERC, TERT, TMEM127, TP53, TSC1, TSC2, VHL, WRN, WT1).  Since the current test is not perfect, it is possible that there may be a gene mutation that current testing cannot detect, but that chance is small. It is possible that a different genetic factor, which has not yet been discovered or is not on this panel, is responsible for the cancer diagnoses in the family. Again, the likelihood of this is low. No additional testing is recommended at this time for Ms.  Mcclure.  Cancer Screening: These results suggest that Melissa Mcclure's cancer was most likely not due to an inherited predisposition. Most cancers happen by chance and this test, along with details of her family history, suggests that her cancer falls into this category. She is recommended to follow the cancer screening guidelines provided by her physician.   Family Members: Family members are at some increased risk of developing cancer, over the general population risk, simply due to the family history. Family members are recommended to speak with their own providers about appropriate cancer screenings.  Any relative who had cancer at a young age or had a particularly rare cancer may also wish to pursue genetic testing. Genetic counselors can be located in other cities, by visiting the website of the [employer] (ArtistMovie.se) and Field seismologist for a Dietitian by zip code.   Lastly, cancer genetics is a rapidly advancing field and it is possible that new genetic tests will be appropriate for Melissa Mcclure in the future. We encourage her to remain in contact with [place] on an annual basis so we can update her personal and family histories, and let her know of advances in cancer genetics that may benefit the family. Our contact number was provided. Melissa Mcclure is welcome to call anytime with additional questions.     Steele Berg, MS, [place] Certified Genetic Counselor phone: [phone]

## 2017-12-24 DIAGNOSIS — D0591 Unspecified type of carcinoma in situ of right breast: Secondary | ICD-10-CM | POA: Diagnosis not present

## 2017-12-24 DIAGNOSIS — C50311 Malignant neoplasm of lower-inner quadrant of right female breast: Secondary | ICD-10-CM | POA: Diagnosis not present

## 2017-12-25 ENCOUNTER — Encounter (HOSPITAL_BASED_OUTPATIENT_CLINIC_OR_DEPARTMENT_OTHER)
Admission: RE | Disposition: A | Discharge status: Home / Self Care | Payer: Self-pay | Source: Ambulatory Visit | Attending: General Surgery

## 2017-12-25 ENCOUNTER — Ambulatory Visit (HOSPITAL_BASED_OUTPATIENT_CLINIC_OR_DEPARTMENT_OTHER)
Admission: RE | Admit: 2017-12-25 | Discharge: 2017-12-25 | Disposition: A | Discharge status: Home / Self Care | Payer: BLUE CROSS/BLUE SHIELD | Source: Ambulatory Visit | Attending: General Surgery | Admitting: General Surgery

## 2017-12-25 ENCOUNTER — Ambulatory Visit (HOSPITAL_BASED_OUTPATIENT_CLINIC_OR_DEPARTMENT_OTHER): Payer: BLUE CROSS/BLUE SHIELD | Admitting: Anesthesiology

## 2017-12-25 ENCOUNTER — Encounter (HOSPITAL_BASED_OUTPATIENT_CLINIC_OR_DEPARTMENT_OTHER): Payer: Self-pay | Admitting: Anesthesiology

## 2017-12-25 DIAGNOSIS — R011 Cardiac murmur, unspecified: Secondary | ICD-10-CM | POA: Insufficient documentation

## 2017-12-25 DIAGNOSIS — K219 Gastro-esophageal reflux disease without esophagitis: Secondary | ICD-10-CM | POA: Diagnosis not present

## 2017-12-25 DIAGNOSIS — I10 Essential (primary) hypertension: Secondary | ICD-10-CM | POA: Diagnosis not present

## 2017-12-25 DIAGNOSIS — Z882 Allergy status to sulfonamides status: Secondary | ICD-10-CM | POA: Diagnosis not present

## 2017-12-25 DIAGNOSIS — D0511 Intraductal carcinoma in situ of right breast: Secondary | ICD-10-CM | POA: Diagnosis present

## 2017-12-25 DIAGNOSIS — Z79899 Other long term (current) drug therapy: Secondary | ICD-10-CM | POA: Insufficient documentation

## 2017-12-25 DIAGNOSIS — Z85820 Personal history of malignant melanoma of skin: Secondary | ICD-10-CM | POA: Insufficient documentation

## 2017-12-25 DIAGNOSIS — M1990 Unspecified osteoarthritis, unspecified site: Secondary | ICD-10-CM | POA: Insufficient documentation

## 2017-12-25 DIAGNOSIS — C50311 Malignant neoplasm of lower-inner quadrant of right female breast: Secondary | ICD-10-CM | POA: Diagnosis not present

## 2017-12-25 HISTORY — PX: BREAST LUMPECTOMY WITH RADIOACTIVE SEED LOCALIZATION: SHX6424

## 2017-12-25 SURGERY — BREAST LUMPECTOMY WITH RADIOACTIVE SEED LOCALIZATION
Anesthesia: General | Site: Breast | Laterality: Right

## 2017-12-25 MED ORDER — HYDROMORPHONE HCL 1 MG/ML IJ SOLN
INTRAMUSCULAR | Status: AC
  Filled 2017-12-25: qty 0.5

## 2017-12-25 MED ORDER — LACTATED RINGERS IV SOLN: INTRAVENOUS | Status: DC

## 2017-12-25 MED ORDER — MEPERIDINE HCL 25 MG/ML IJ SOLN: 6.2500 mg | INTRAMUSCULAR | Status: DC | PRN

## 2017-12-25 MED ORDER — DEXAMETHASONE SODIUM PHOSPHATE 4 MG/ML IJ SOLN
INTRAMUSCULAR | Status: DC | PRN
  Administered 2017-12-25: 10 mg via INTRAVENOUS

## 2017-12-25 MED ORDER — PROPOFOL 10 MG/ML IV BOLUS
INTRAVENOUS | Status: AC
  Filled 2017-12-25: qty 20

## 2017-12-25 MED ORDER — BUPIVACAINE-EPINEPHRINE (PF) 0.5% -1:200000 IJ SOLN
INTRAMUSCULAR | Status: DC | PRN
  Administered 2017-12-25: 10 mL

## 2017-12-25 MED ORDER — MIDAZOLAM HCL 2 MG/2ML IJ SOLN: 1.0000 mg | INTRAMUSCULAR | Status: DC | PRN

## 2017-12-25 MED ORDER — ACETAMINOPHEN 500 MG PO TABS
ORAL_TABLET | ORAL | Status: AC
  Filled 2017-12-25: qty 2

## 2017-12-25 MED ORDER — HYDROCODONE-ACETAMINOPHEN 5-325 MG PO TABS: 1.0000 | ORAL_TABLET | Freq: Four times a day (QID) | ORAL | 0 refills | Status: DC | PRN

## 2017-12-25 MED ORDER — ONDANSETRON HCL 4 MG/2ML IJ SOLN
INTRAMUSCULAR | Status: AC
  Filled 2017-12-25: qty 2

## 2017-12-25 MED ORDER — CHLORHEXIDINE GLUCONATE CLOTH 2 % EX PADS: 6.0000 | MEDICATED_PAD | Freq: Once | CUTANEOUS | Status: DC

## 2017-12-25 MED ORDER — LIDOCAINE HCL (CARDIAC) 20 MG/ML IV SOLN
INTRAVENOUS | Status: AC
  Filled 2017-12-25: qty 5

## 2017-12-25 MED ORDER — PROMETHAZINE HCL 25 MG/ML IJ SOLN: 6.2500 mg | INTRAMUSCULAR | Status: DC | PRN

## 2017-12-25 MED ORDER — BUPIVACAINE-EPINEPHRINE (PF) 0.5% -1:200000 IJ SOLN
INTRAMUSCULAR | Status: AC
  Filled 2017-12-25: qty 30

## 2017-12-25 MED ORDER — GABAPENTIN 300 MG PO CAPS: 300.0000 mg | ORAL_CAPSULE | ORAL | Status: DC

## 2017-12-25 MED ORDER — MIDAZOLAM HCL 2 MG/2ML IJ SOLN
INTRAMUSCULAR | Status: AC
  Filled 2017-12-25: qty 2

## 2017-12-25 MED ORDER — ACETAMINOPHEN 500 MG PO TABS: 1000.0000 mg | ORAL_TABLET | ORAL | Status: DC

## 2017-12-25 MED ORDER — ACETAMINOPHEN 500 MG PO TABS
1000.0000 mg | ORAL_TABLET | ORAL | Status: AC
  Administered 2017-12-25: 1000 mg via ORAL

## 2017-12-25 MED ORDER — ONDANSETRON HCL 4 MG/2ML IJ SOLN
INTRAMUSCULAR | Status: DC | PRN
  Administered 2017-12-25: 4 mg via INTRAVENOUS

## 2017-12-25 MED ORDER — FENTANYL CITRATE (PF) 100 MCG/2ML IJ SOLN
INTRAMUSCULAR | Status: AC
  Filled 2017-12-25: qty 2

## 2017-12-25 MED ORDER — CEFAZOLIN SODIUM-DEXTROSE 2-4 GM/100ML-% IV SOLN
INTRAVENOUS | Status: AC
  Filled 2017-12-25: qty 100

## 2017-12-25 MED ORDER — PROPOFOL 10 MG/ML IV BOLUS
INTRAVENOUS | Status: DC | PRN
  Administered 2017-12-25: 150 mg via INTRAVENOUS

## 2017-12-25 MED ORDER — LIDOCAINE HCL (CARDIAC) 20 MG/ML IV SOLN
INTRAVENOUS | Status: DC | PRN
  Administered 2017-12-25: 30 mg via INTRAVENOUS

## 2017-12-25 MED ORDER — SODIUM CHLORIDE 0.9% FLUSH: 3.0000 mL | INTRAVENOUS | Status: DC | PRN

## 2017-12-25 MED ORDER — SCOPOLAMINE 1 MG/3DAYS TD PT72: 1.0000 | MEDICATED_PATCH | Freq: Once | TRANSDERMAL | Status: DC | PRN

## 2017-12-25 MED ORDER — OXYCODONE HCL 5 MG PO TABS: 5.0000 mg | ORAL_TABLET | ORAL | Status: DC | PRN

## 2017-12-25 MED ORDER — LACTATED RINGERS IV SOLN
INTRAVENOUS | Status: DC
  Administered 2017-12-25 (×2): via INTRAVENOUS

## 2017-12-25 MED ORDER — FENTANYL CITRATE (PF) 100 MCG/2ML IJ SOLN
INTRAMUSCULAR | Status: DC | PRN
  Administered 2017-12-25: 100 ug via INTRAVENOUS
  Administered 2017-12-25: 25 ug via INTRAVENOUS

## 2017-12-25 MED ORDER — CELECOXIB 200 MG PO CAPS
200.0000 mg | ORAL_CAPSULE | ORAL | Status: AC
  Administered 2017-12-25: 200 mg via ORAL

## 2017-12-25 MED ORDER — FENTANYL CITRATE (PF) 100 MCG/2ML IJ SOLN: 50.0000 ug | INTRAMUSCULAR | Status: DC | PRN

## 2017-12-25 MED ORDER — OXYCODONE HCL 5 MG/5ML PO SOLN: 5.0000 mg | Freq: Once | ORAL | Status: AC | PRN

## 2017-12-25 MED ORDER — CELECOXIB 200 MG PO CAPS
ORAL_CAPSULE | ORAL | Status: AC
Start: 2017-12-25 — End: ?
  Filled 2017-12-25: qty 1

## 2017-12-25 MED ORDER — OXYCODONE HCL 5 MG PO TABS
5.0000 mg | ORAL_TABLET | Freq: Once | ORAL | Status: AC | PRN
  Administered 2017-12-25: 5 mg via ORAL

## 2017-12-25 MED ORDER — CELECOXIB 200 MG PO CAPS: 200.0000 mg | ORAL_CAPSULE | ORAL | Status: DC

## 2017-12-25 MED ORDER — SODIUM CHLORIDE 0.9 % IV SOLN
250.0000 mL | INTRAVENOUS | Status: DC | PRN
Start: 2017-12-25 — End: 2017-12-25

## 2017-12-25 MED ORDER — MIDAZOLAM HCL 5 MG/5ML IJ SOLN
INTRAMUSCULAR | Status: DC | PRN
  Administered 2017-12-25: 2 mg via INTRAVENOUS

## 2017-12-25 MED ORDER — SCOPOLAMINE 1 MG/3DAYS TD PT72
MEDICATED_PATCH | TRANSDERMAL | Status: DC | PRN
  Administered 2017-12-25: 1 via TRANSDERMAL

## 2017-12-25 MED ORDER — CEFAZOLIN SODIUM-DEXTROSE 2-4 GM/100ML-% IV SOLN
2.0000 g | INTRAVENOUS | Status: AC
  Administered 2017-12-25: 2 g via INTRAVENOUS

## 2017-12-25 MED ORDER — GABAPENTIN 300 MG PO CAPS
ORAL_CAPSULE | ORAL | Status: AC
Start: 2017-12-25 — End: ?
  Filled 2017-12-25: qty 1

## 2017-12-25 MED ORDER — OXYCODONE HCL 5 MG PO TABS
ORAL_TABLET | ORAL | Status: AC
  Filled 2017-12-25: qty 1

## 2017-12-25 MED ORDER — SODIUM CHLORIDE 0.9% FLUSH: 3.0000 mL | Freq: Two times a day (BID) | INTRAVENOUS | Status: DC

## 2017-12-25 MED ORDER — CEFAZOLIN SODIUM-DEXTROSE 2-4 GM/100ML-% IV SOLN: 2.0000 g | INTRAVENOUS | Status: DC

## 2017-12-25 MED ORDER — ACETAMINOPHEN 650 MG RE SUPP: 650.0000 mg | RECTAL | Status: DC | PRN

## 2017-12-25 MED ORDER — 0.9 % SODIUM CHLORIDE (POUR BTL) OPTIME
TOPICAL | Status: DC | PRN
  Administered 2017-12-25: 1000 mL

## 2017-12-25 MED ORDER — DEXAMETHASONE SODIUM PHOSPHATE 10 MG/ML IJ SOLN
INTRAMUSCULAR | Status: AC
Start: 2017-12-25 — End: ?
  Filled 2017-12-25: qty 1

## 2017-12-25 MED ORDER — SCOPOLAMINE 1 MG/3DAYS TD PT72
MEDICATED_PATCH | TRANSDERMAL | Status: AC
  Filled 2017-12-25: qty 1

## 2017-12-25 MED ORDER — HYDROMORPHONE HCL 1 MG/ML IJ SOLN
0.2500 mg | INTRAMUSCULAR | Status: DC | PRN
  Administered 2017-12-25: 0.5 mg via INTRAVENOUS

## 2017-12-25 MED ORDER — FENTANYL CITRATE (PF) 100 MCG/2ML IJ SOLN: 25.0000 ug | INTRAMUSCULAR | Status: DC | PRN

## 2017-12-25 MED ORDER — GABAPENTIN 300 MG PO CAPS
300.0000 mg | ORAL_CAPSULE | ORAL | Status: AC
  Administered 2017-12-25: 300 mg via ORAL

## 2017-12-25 MED ORDER — ACETAMINOPHEN 325 MG PO TABS: 650.0000 mg | ORAL_TABLET | ORAL | Status: DC | PRN

## 2017-12-25 SURGICAL SUPPLY — 47 items
ADH SKN CLS APL DERMABOND .7 (GAUZE/BANDAGES/DRESSINGS) ×1
APPLIER CLIP 9.375 MED OPEN (MISCELLANEOUS) ×3
APR CLP MED 9.3 20 MLT OPN (MISCELLANEOUS) ×1
BINDER BREAST MEDIUM (GAUZE/BANDAGES/DRESSINGS) ×2 IMPLANT
BLADE HEX COATED 2.75 (ELECTRODE) ×3 IMPLANT
BLADE SURG 15 STRL LF DISP TIS (BLADE) ×1 IMPLANT
BLADE SURG 15 STRL SS (BLADE) ×3
CANISTER SUCT 1200ML W/VALVE (MISCELLANEOUS) ×3 IMPLANT
CHLORAPREP W/TINT 26ML (MISCELLANEOUS) ×3 IMPLANT
CLIP APPLIE 9.375 MED OPEN (MISCELLANEOUS) IMPLANT
COVER BACK TABLE 60X90IN (DRAPES) ×3 IMPLANT
COVER MAYO STAND STRL (DRAPES) ×3 IMPLANT
COVER PROBE W GEL 5X96 (DRAPES) ×3 IMPLANT
DERMABOND ADVANCED (GAUZE/BANDAGES/DRESSINGS) ×2
DERMABOND ADVANCED .7 DNX12 (GAUZE/BANDAGES/DRESSINGS) ×1 IMPLANT
DEVICE DUBIN W/COMP PLATE 8390 (MISCELLANEOUS) ×3 IMPLANT
DRAPE LAPAROSCOPIC ABDOMINAL (DRAPES) ×3 IMPLANT
DRAPE UTILITY XL STRL (DRAPES) ×3 IMPLANT
DRSG PAD ABDOMINAL 8X10 ST (GAUZE/BANDAGES/DRESSINGS) ×2 IMPLANT
ELECT REM PT RETURN 9FT ADLT (ELECTROSURGICAL) ×3
ELECTRODE REM PT RTRN 9FT ADLT (ELECTROSURGICAL) ×1 IMPLANT
GAUZE SPONGE 4X4 12PLY STRL LF (GAUZE/BANDAGES/DRESSINGS) ×2 IMPLANT
GLOVE EUDERMIC 7 POWDERFREE (GLOVE) ×3 IMPLANT
GOWN STRL REUS W/ TWL LRG LVL3 (GOWN DISPOSABLE) ×1 IMPLANT
GOWN STRL REUS W/ TWL XL LVL3 (GOWN DISPOSABLE) ×1 IMPLANT
GOWN STRL REUS W/TWL LRG LVL3 (GOWN DISPOSABLE) ×3
GOWN STRL REUS W/TWL XL LVL3 (GOWN DISPOSABLE) ×3
KIT MARKER MARGIN INK (KITS) ×3 IMPLANT
NDL HYPO 25X1 1.5 SAFETY (NEEDLE) ×1 IMPLANT
NEEDLE HYPO 25X1 1.5 SAFETY (NEEDLE) ×3 IMPLANT
NS IRRIG 1000ML POUR BTL (IV SOLUTION) ×3 IMPLANT
PACK BASIN DAY SURGERY FS (CUSTOM PROCEDURE TRAY) ×3 IMPLANT
PENCIL BUTTON HOLSTER BLD 10FT (ELECTRODE) ×3 IMPLANT
SLEEVE SCD COMPRESS KNEE MED (MISCELLANEOUS) ×3 IMPLANT
SPONGE LAP 4X18 X RAY DECT (DISPOSABLE) ×3 IMPLANT
SUT MNCRL AB 4-0 PS2 18 (SUTURE) ×3 IMPLANT
SUT SILK 2 0 SH (SUTURE) ×3 IMPLANT
SUT VIC AB 2-0 CT1 27 (SUTURE)
SUT VIC AB 2-0 CT1 TAPERPNT 27 (SUTURE) IMPLANT
SUT VIC AB 3-0 SH 27 (SUTURE)
SUT VIC AB 3-0 SH 27X BRD (SUTURE) IMPLANT
SUT VICRYL 3-0 CR8 SH (SUTURE) ×3 IMPLANT
SYR 10ML LL (SYRINGE) ×3 IMPLANT
TOWEL OR 17X24 6PK STRL BLUE (TOWEL DISPOSABLE) ×3 IMPLANT
TUBE CONNECTING 20'X1/4 (TUBING) ×1
TUBE CONNECTING 20X1/4 (TUBING) ×2 IMPLANT
YANKAUER SUCT BULB TIP NO VENT (SUCTIONS) ×3 IMPLANT

## 2017-12-25 NOTE — Transfer of Care (Signed)
Immediate Anesthesia Transfer of Care Note  Patient: Melissa Mcclure  Procedure(s) Performed: RIGHT BREAST LUMPECTOMY WITH RADIOACTIVE SEED LOCALIZATION (Right Breast)  Patient Location: PACU  Anesthesia Type:General  Level of Consciousness: awake and patient cooperative  Airway & Oxygen Therapy: Patient Spontanous Breathing and Patient connected to face mask oxygen  Post-op Assessment: Report given to RN and Post -op Vital signs reviewed and stable  Post vital signs: Reviewed and stable  Last Vitals:  Vitals Value Taken Time  BP 125/77 12/25/2017  9:25 AM  Temp    Pulse 74 12/25/2017  9:26 AM  Resp 10 12/25/2017  9:26 AM  SpO2 100 % 12/25/2017  9:26 AM  Vitals shown include unvalidated device data.  Last Pain:  Vitals:   12/25/17 0708  TempSrc: Oral  PainSc: 0-No pain         Complications: No apparent anesthesia complications

## 2017-12-25 NOTE — Anesthesia Postprocedure Evaluation (Signed)
Anesthesia Post Note  Patient: Melissa Mcclure  Procedure(s) Performed: RIGHT BREAST LUMPECTOMY WITH RADIOACTIVE SEED LOCALIZATION (Right Breast)     Patient location during evaluation: PACU Anesthesia Type: General Level of consciousness: awake and alert Pain management: pain level controlled Vital Signs Assessment: post-procedure vital signs reviewed and stable Respiratory status: spontaneous breathing, nonlabored ventilation, respiratory function stable and patient connected to nasal cannula oxygen Cardiovascular status: blood pressure returned to baseline and stable Postop Assessment: no apparent nausea or vomiting Anesthetic complications: no    Last Vitals:  Vitals:   12/25/17 1000 12/25/17 1032  BP: 123/76 130/83  Pulse: 61 62  Resp: 16 18  Temp:  36.4 C  SpO2: 98% 97%    Last Pain:  Vitals:   12/25/17 1032  TempSrc:   PainSc: [place]

## 2017-12-25 NOTE — Anesthesia Preprocedure Evaluation (Addendum)
Anesthesia Evaluation  Patient identified by MRN, date of birth, ID band Patient awake    Reviewed: Allergy & Precautions, NPO status , Patient's Chart, lab work & pertinent test results  History of Anesthesia Complications (+) PONV and history of anesthetic complications  Airway Mallampati: I  TM Distance: >3 FB Neck ROM: Full    Dental  (+) Teeth Intact, Dental Advisory Given   Pulmonary neg pulmonary ROS,    breath sounds clear to auscultation       Cardiovascular hypertension, Pt. on medications  Rhythm:Regular Rate:Normal     Neuro/Psych  Neuromuscular disease negative psych ROS   GI/Hepatic Neg liver ROS, hiatal hernia, GERD  Medicated,  Endo/Other  negative endocrine ROS  Renal/GU negative Renal ROS     Musculoskeletal   Abdominal Normal abdominal exam  (+)   Peds  Hematology negative hematology ROS (+)   Anesthesia Other Findings   Reproductive/Obstetrics                            Anesthesia Physical Anesthesia Plan  ASA: II  Anesthesia Plan: General   Post-op Pain Management:    Induction: Intravenous  PONV Risk Score and Plan: 4 or greater and Ondansetron, Dexamethasone, Midazolam and Scopolamine patch - Pre-op  Airway Management Planned: LMA  Additional Equipment: None  Intra-op Plan:   Post-operative Plan: Extubation in OR  Informed Consent: I have reviewed the patients History and Physical, chart, labs and discussed the procedure including the risks, benefits and alternatives for the proposed anesthesia with the patient or authorized representative who has indicated his/her understanding and acceptance.   Dental advisory given  Plan Discussed with: CRNA  Anesthesia Plan Comments:        Anesthesia Quick Evaluation

## 2017-12-25 NOTE — Discharge Instructions (Signed)
Oxycodone 5mg  given at 1030. Next dose due at 4:30pm  [employer] Phone Number [phone]  BREAST BIOPSY/ PARTIAL MASTECTOMY: POST OP INSTRUCTIONS  Always review your discharge instruction sheet given to you by the facility where your surgery was performed.  IF YOU HAVE DISABILITY OR FAMILY LEAVE FORMS, YOU MUST BRING THEM TO THE OFFICE FOR PROCESSING.  DO NOT GIVE THEM TO YOUR DOCTOR.  1. A prescription for pain medication may be given to you upon discharge.  Take your pain medication as prescribed, if needed.  If narcotic pain medicine is not needed, then you may take acetaminophen (Tylenol) or ibuprofen (Advil) as needed. 2. Take your usually prescribed medications unless otherwise directed 3. If you need a refill on your pain medication, please contact your pharmacy.  They will contact our office to request authorization.  Prescriptions will not be filled after 5pm or on week-ends. 4. You should eat very light the first 24 hours after surgery, such as soup, crackers, pudding, etc.  Resume your normal diet the day after surgery. 5. Most patients will experience some swelling and bruising in the breast.  Ice packs and a good support bra will help.  Swelling and bruising can take several days to resolve.  6. It is common to experience some constipation if taking pain medication after surgery.  Increasing fluid intake and taking a stool softener will usually help or prevent this problem from occurring.  A mild laxative (Milk of Magnesia or Miralax) should be taken according to package directions if there are no bowel movements after 48 hours. 7. Unless discharge instructions indicate otherwise, you may remove your bandages 24-48 hours after surgery, and you may shower at that time.  You may have steri-strips (small skin tapes) in place directly over the incision.  These strips should be left on the skin for 7-10 days.  If your surgeon used skin glue on the incision, you may  shower in 24 hours.  The glue will flake off over the next 2-3 weeks.  Any sutures or staples will be removed at the office during your follow-up visit. 8. ACTIVITIES:  You may resume regular daily activities (gradually increasing) beginning the next day.  Wearing a good support bra or sports bra minimizes pain and swelling.  You may have sexual intercourse when it is comfortable. a. You may drive when you no longer are taking prescription pain medication, you can comfortably wear a seatbelt, and you can safely maneuver your car and apply brakes. b. RETURN TO WORK:  ______________________________________________________________________________________ 9. You should see your doctor in the office for a follow-up appointment approximately two weeks after your surgery.  Your doctors nurse will typically make your follow-up appointment when she calls you with your pathology report.  Expect your pathology report 2-3 business days after your surgery.  You may call to check if you do not hear from [place] after three days. 10. OTHER INSTRUCTIONS: _______________________________________________________________________________________________ _____________________________________________________________________________________________________________________________________ _____________________________________________________________________________________________________________________________________ _____________________________________________________________________________________________________________________________________  WHEN TO CALL YOUR DOCTOR: 1. Fever over 101.0 2. Nausea and/or vomiting. 3. Extreme swelling or bruising. 4. Continued bleeding from incision. 5. Increased pain, redness, or drainage from the incision.  The clinic staff is available to answer your questions during regular business hours.  Please dont hesitate to call and ask to speak to one of the nurses for clinical concerns.   If you have a medical emergency, go to the nearest emergency room or call 911.  A surgeon from [facility] is always on call  at the hospital.  For further questions, please visit [email or web address]           Post Anesthesia Home Care Instructions  Activity: Get plenty of rest for the remainder of the day. A responsible individual must stay with you for 24 hours following the procedure.  For the next 24 hours, DO NOT: -Drive a car -Paediatric nurse -Drink alcoholic beverages -Take any medication unless instructed by your physician -Make any legal decisions or sign important papers.  Meals: Start with liquid foods such as gelatin or soup. Progress to regular foods as tolerated. Avoid greasy, spicy, heavy foods. If nausea and/or vomiting occur, drink only clear liquids until the nausea and/or vomiting subsides. Call your physician if vomiting continues.  Special Instructions/Symptoms: Your throat may feel dry or sore from the anesthesia or the breathing tube placed in your throat during surgery. If this causes discomfort, gargle with warm salt water. The discomfort should disappear within 24 hours.  If you had a scopolamine patch placed behind your ear for the management of post- operative nausea and/or vomiting:  1. The medication in the patch is effective for 72 hours, after which it should be removed.  Wrap patch in a tissue and discard in the trash. Wash hands thoroughly with soap and water. 2. You may remove the patch earlier than 72 hours if you experience unpleasant side effects which may include dry mouth, dizziness or visual disturbances. 3. Avoid touching the patch. Wash your hands with soap and water after contact with the patch.

## 2017-12-25 NOTE — Op Note (Addendum)
Patient Name:           Melissa Mcclure   Date of Surgery:        12/25/2017  Pre op Diagnosis:      Ductal carcinoma in situ right breast, lower inner quadrant  Post op Diagnosis:   same  Procedure:                 Right breast lumpectomy with radioactive seed localization  Surgeon:                     Edsel Petrin. Dalbert Batman, M.D., FACS  Assistant:                      OR staff  Operative Indications:  This is a 62 year old female, referred by Dr. Marcelo Baldy at [facility] mammography for evaluation of ductal carcinoma in situ right breast, lower inner quadrant. She was seen in the [facility]  by Dr. Burr Medico, Dr. Lisbeth Renshaw, and me. Dr. Maury Dus is her PCP. Dr. Janeann Merl is her gynecologist.       She's had no prior breast problems. Gets annual screening. Recent mammography shows a 3 cm area of calcifications without mass in the right breast, 4 o'clock position, middle depth. Ultrasound of the axilla is negative. Image guided biopsy shows intermediate grade DCIS, ER 90%, PR 25%.      We had a very long talk.We talked about lumpectomy, radiation therapy, mastectomy with or without reconstruction. We talked about the COMET research trial for treatment with tamoxifen alone without surgery. At this point in time she thinks she wants to proceed with standard therapy which would be right breast lumpectomy and adjuvant radiation therapy and adjuvant antiestrogen therapy. She agrees with this plan.      Operative Findings:       Her breast skin and breast tissue were atrophic and very loose.  To perform the lumpectomy in the lower inner quadrant of the tissues were rather thin so I dissected the skin off of the breast tissue before proceeding with the lumpectomy.  Therefore the anterior margin is the scan.  The lumpectomy specimen itself was probably 5 cm in diameter.  The specimen mammogram looked good showing the marker clip and the radioactive seed in the center of the specimen.  Procedure in Detail:           Following the induction of general LMA anesthesia the patient's right breast was prepped and draped in a sterile fashion.  Surgical timeout was performed.  Intravenous antibiotics were given.  0.5% Marcaine with epinephrine was used as a local infiltration anesthetic.     The breast tissues were atrophic and thin.  A transverse skin crease incision was made in the lower inner quadrant.  Lumpectomy was performed using the neoprobe and electrocautery.  The specimen was removed and marked with silk sutures and a 6 color ink kit to orient the pathologist.  Specimen mammogram looked good as mentioned above.  The specimen was marked and sent to the lab where the seed was retrieved.  Hemostasis was excellent.  The wound was irrigated.  5 metal marker clips were placed in the walls of the lumpectomy.  The lumpectomy cavity was closed in several layers with interrupted sutures of 3-0 Vicryl and the skin closed with a running subcuticular 4-0 Monocryl and Dermabond.     Dressed with 4 x 4's ABDs and a breast binder.  The patient tolerated the procedure well and  was taken to PACU in stable condition.  EBL 10 cc.  Counts correct.  Complications none.     Addendum: I logged to the [name] website and reviewed her prescription medication history     Toren Tucholski M. Dalbert Batman, M.D., FACS General and Minimally Invasive Surgery Breast and Colorectal Surgery  12/25/2017 9:31 AM

## 2017-12-25 NOTE — Interval H&P Note (Signed)
History and Physical Interval Note:  12/25/2017 7:58 AM  Melissa Mcclure  has presented today for surgery, with the diagnosis of RIGHT BREAST DCIS  The various methods of treatment have been discussed with the patient and family. After consideration of risks, benefits and other options for treatment, the patient has consented to  Procedure(s): RIGHT BREAST LUMPECTOMY WITH RADIOACTIVE SEED LOCALIZATION (Right) as a surgical intervention .  The patient's history has been reviewed, patient examined, no change in status, stable for surgery.  I have reviewed the patient's chart and labs.  Questions were answered to the patient's satisfaction.     Adin Hector

## 2017-12-25 NOTE — Anesthesia Procedure Notes (Signed)
Procedure Name: LMA Insertion Date/Time: 12/25/2017 8:41 AM Performed by: Marrianne Mood, CRNA Pre-anesthesia Checklist: Patient identified, Emergency Drugs available, Suction available, Patient being monitored and Timeout performed Patient Re-evaluated:Patient Re-evaluated prior to induction Oxygen Delivery Method: Circle system utilized Preoxygenation: Pre-oxygenation with 100% oxygen Induction Type: IV induction Ventilation: Mask ventilation without difficulty LMA: LMA inserted LMA Size: 4.0 Number of attempts: 1 Airway Equipment and Method: Bite block Placement Confirmation: positive ETCO2 Tube secured with: Tape Dental Injury: Teeth and Oropharynx as per pre-operative assessment

## 2017-12-26 ENCOUNTER — Encounter (HOSPITAL_BASED_OUTPATIENT_CLINIC_OR_DEPARTMENT_OTHER): Payer: Self-pay | Admitting: General Surgery

## 2018-01-08 NOTE — Progress Notes (Signed)
Location of Breast Cancer:Ductal carcinoma in situ (DCIS) of right breast.   Histology per Pathology Report:  11-19-17 Diagnosis 11/19/17 Breast, right, needle core biopsy - DUCTAL CARCINOMA IN SITU WITH CALCIFICATIONS. - SEE COMMENT.   Receptors her2 Receptor Status: ER(90% -), PR (25% -), Her2-neu (), Ki-()  Did patient present with symptoms (if so, please note symptoms) or was this found on screening mammography?: screening detected calcifications in the right breast on mammography    Past/Anticipated interventions by surgeon, if any:  Diagnosis 12-25-17 Dr. Fanny Skates Breast, lumpectomy, Right w/seed - DUCTAL CARCINOMA IN SITU WITH CALCIFICATIONS, LOW GRADE, SPANNING 1.3 CM. - THE SURGICAL RESECTION MARGINS ARE NEGATIVE FOR CARCINOMA. - SEE ONCOLOGY TABLE BELOW.  Receptor Status: ER(90% -), PR (25% -), Her2-neu (), Ki-()  Past/Anticipated interventions by medical oncology, if any: Dr. Burr Medico  Chemotherapy No  adjuvant endocrine therapy with Tamoxifen, Raloxifene or Anastrozole for a total of 5 years, 12-12-17 Genetic Testing negative  Adjuvant radiation    Lymphedema issues, if any:   Rom to right arm, Yes Skin to right breast    follow up appointment with Dr. Fanny Skates (she saw him Monday 01/13/18)  Pain issues, if any:  She denies.   SAFETY ISSUES:  Prior radiation? No  Pacemaker/ICD? No  Possible current pregnancy? : No  Is the patient on methotrexate? No   Menarche [age]  G3 P3,  BC, LMP 79's Menopause 40's HRT yes in her 84's  estrogen mist then progressed to the Estradial patch    Current Complaints / other details: Colon cancer mother, ovarian cancer maternal grandmother  Melanoma father  BP (!) 145/88   Pulse 71   Temp 98.4 F (36.9 C)   Resp 18   Ht _0  (1.575 m)   Wt 119 lb 12.8 oz (54.3 kg)   LMP 06/24/2008   SpO2 100% Comment: room air  BMI 21.91 kg/m    Wt Readings from Last 3 Encounters:  01/16/18 119 lb 12.8 oz (54.3  kg)  12/25/17 116 lb (52.6 kg)  11/27/17 116 lb 8 oz (52.8 kg)      Georgena Spurling, RN 01/08/2018,4:58 PM

## 2018-01-13 ENCOUNTER — Telehealth: Payer: Self-pay | Admitting: *Deleted

## 2018-01-13 NOTE — Telephone Encounter (Signed)
Faxed ROI to [employer], attn: Joselito Darrold Span; release [id number]

## 2018-01-14 ENCOUNTER — Ambulatory Visit: Payer: BLUE CROSS/BLUE SHIELD | Admitting: Radiation Oncology

## 2018-01-14 ENCOUNTER — Ambulatory Visit: Payer: BLUE CROSS/BLUE SHIELD

## 2018-01-16 ENCOUNTER — Ambulatory Visit
Admission: RE | Admit: 2018-01-16 | Discharge: 2018-01-16 | Disposition: A | Discharge status: Home / Self Care | Payer: BLUE CROSS/BLUE SHIELD | Source: Ambulatory Visit | Attending: Radiation Oncology | Admitting: Radiation Oncology

## 2018-01-16 ENCOUNTER — Encounter: Payer: Self-pay | Admitting: Radiation Oncology

## 2018-01-16 ENCOUNTER — Other Ambulatory Visit: Payer: Self-pay

## 2018-01-16 VITALS — BP 145/88 | HR 71 | Temp 98.4°F | Resp 18 | Ht 62.0 in | Wt 119.8 lb

## 2018-01-16 DIAGNOSIS — Z800 Family history of malignant neoplasm of digestive organs: Secondary | ICD-10-CM | POA: Insufficient documentation

## 2018-01-16 DIAGNOSIS — K449 Diaphragmatic hernia without obstruction or gangrene: Secondary | ICD-10-CM | POA: Diagnosis not present

## 2018-01-16 DIAGNOSIS — G8929 Other chronic pain: Secondary | ICD-10-CM | POA: Insufficient documentation

## 2018-01-16 DIAGNOSIS — Z981 Arthrodesis status: Secondary | ICD-10-CM | POA: Diagnosis not present

## 2018-01-16 DIAGNOSIS — D0511 Intraductal carcinoma in situ of right breast: Secondary | ICD-10-CM | POA: Insufficient documentation

## 2018-01-16 DIAGNOSIS — Z79899 Other long term (current) drug therapy: Secondary | ICD-10-CM | POA: Insufficient documentation

## 2018-01-16 DIAGNOSIS — Z170 Estrogen receptor positive status [ER+]: Secondary | ICD-10-CM | POA: Diagnosis not present

## 2018-01-16 DIAGNOSIS — Z9889 Other specified postprocedural states: Secondary | ICD-10-CM | POA: Diagnosis not present

## 2018-01-16 DIAGNOSIS — I10 Essential (primary) hypertension: Secondary | ICD-10-CM | POA: Diagnosis not present

## 2018-01-16 DIAGNOSIS — K219 Gastro-esophageal reflux disease without esophagitis: Secondary | ICD-10-CM | POA: Diagnosis not present

## 2018-01-16 DIAGNOSIS — Z85820 Personal history of malignant melanoma of skin: Secondary | ICD-10-CM | POA: Diagnosis not present

## 2018-01-16 DIAGNOSIS — M549 Dorsalgia, unspecified: Secondary | ICD-10-CM | POA: Diagnosis not present

## 2018-01-16 DIAGNOSIS — Z90710 Acquired absence of both cervix and uterus: Secondary | ICD-10-CM | POA: Insufficient documentation

## 2018-01-16 NOTE — Progress Notes (Signed)
Radiation Oncology         [phone] ________________________________  Name: Melissa Mcclure        MRN: [id number]  Date of Service: 01/16/2018 DOB: 06-21-1956  YY:TKPTW, Herbie Baltimore, MD  Truitt Merle, MD     REFERRING PHYSICIAN: Truitt Merle, MD   DIAGNOSIS: The encounter diagnosis was Ductal carcinoma in situ (DCIS) of right breast.   HISTORY OF PRESENT ILLNESS: Melissa Mcclure is a 62 y.o. female originally seen in the multidisciplinary breast clinic for a new diagnosis of right breast cancer. The patient was noted to have screening detected calcifications in the right breast on mammography. Diagnostic imaging revealed a 3 cm span of calcifications, and her axilla was negative for adenopathy by ultrasound. A Tomo guided biopsy on 10/30/17 revealed an intermediate grade, ER/PR positive DCIS.  She underwent lumpectomy on 12/25/17 which revealed a low grade 1.3 cm area of DCIS with calcifications. She had clear margins by 2 mm in all dimensions. She comes today to discuss options of adjuvant radiotherapy.  PREVIOUS RADIATION THERAPY: No   PAST MEDICAL HISTORY:  Past Medical History:  Diagnosis Date  . Acid reflux    GERD  . Cancer ([place]) 08,11   melanoma-left knee, right arm  . Chronic back pain   . Complication of anesthesia   . Ductal carcinoma in situ (DCIS) of right breast 10/2017  . Family history of adverse reaction to anesthesia    mother has PONV  . Family history of colon cancer   . Family history of melanoma   . Family history of ovarian cancer   . Genetic testing 12/23/2017   STAT Breast panel with reflex to Multi-Cancer panel (83 genes) @ [facility] - No pathogenic mutations detected  . GERD (gastroesophageal reflux disease)   . H/O hiatal hernia   . Heart murmur    no problems  . Hypertension   . PONV (postoperative nausea and vomiting)    "zofran works great  . Seasonal allergies   . SVD (spontaneous vaginal delivery)    x 1  . Vertigo        PAST SURGICAL HISTORY: Past  Surgical History:  Procedure Laterality Date  . ABDOMINAL HYSTERECTOMY    . ANTERIOR LAT LUMBAR FUSION Right 04/17/2013   Procedure: ANTERIOR LATERAL LUMBAR FUSION 3 LEVELS;  Surgeon: Erline Levine, MD;  Location: [place] [facility];  Service: Neurosurgery;  Laterality: Right;  Right Lumbar Two-Three Lumbar Three-Four Lumbar Four-Five  Anterolateral Fusion  . BREAST LUMPECTOMY WITH RADIOACTIVE SEED LOCALIZATION Right 12/25/2017   Procedure: RIGHT BREAST LUMPECTOMY WITH RADIOACTIVE SEED LOCALIZATION;  Surgeon: Fanny Skates, MD;  Location: [employer];  Service: General;  Laterality: Right;  . [place]  . CESAREAN SECTION  1980  . COLONOSCOPY    . CYSTOSCOPY N/A 06/26/2016   Procedure: CYSTOSCOPY;  Surgeon: Megan Salon, MD;  Location: [facility];  Service: Gynecology;  Laterality: N/A;  . DILATATION & CURETTAGE/HYSTEROSCOPY WITH MYOSURE N/A 01/24/2016   Procedure: DILATATION & CURETTAGE/HYSTEROSCOPY WITH MYOSURE;  Surgeon: Megan Salon, MD;  Location: [facility];  Service: Gynecology;  Laterality: N/A;  . FOOT SURGERY Right    early 20s-bone spur removal  . LAPAROSCOPIC HYSTERECTOMY Bilateral 06/26/2016   Procedure: HYSTERECTOMY TOTAL LAPAROSCOPIC;  Surgeon: Megan Salon, MD;  Location: [facility];  Service: Gynecology;  Laterality: Bilateral;  . LUMBAR PERCUTANEOUS PEDICLE SCREW 3 LEVEL N/A 04/17/2013   Procedure: LUMBAR PERCUTANEOUS PEDICLE SCREW 3 LEVEL;  Surgeon: Erline Levine,  MD;  Location: [place] [facility];  Service: Neurosurgery;  Laterality: N/A;  Decompression and Posterior Lumbar Interbody Fusion of Lumbar Five-Sacral One, Percutaneous Screws Lumbar Two through Sacral One  . MELANOMA EXCISION     knee 08 lft. elbow 11rt  . SALPINGOOPHORECTOMY Bilateral 06/26/2016   Procedure: SALPINGO OOPHORECTOMY;  Surgeon: Megan Salon, MD;  Location: [facility];  Service: Gynecology;  Laterality: Bilateral;  . SHOULDER SURGERY Right 04/28/2015  . TUBAL LIGATION  1986  . WLE  06/17/07   and left  groin lymph node     FAMILY HISTORY:  Family History  Problem Relation Age of Onset  . Diabetes Father        borderline  . Melanoma Father        dx 71s/60s; deceased 35s  . Congestive Heart Failure Father   . Colon cancer Mother        dx 4s; deceased 2  . Hypertension Mother   . Osteoporosis Mother   . Ovarian cancer Maternal Grandmother        deceased 46s  . Heart disease Paternal Grandfather   . Melanoma Daughter        on leg; dx 49s     SOCIAL HISTORY:  reports that she has never smoked. She has never used smokeless tobacco. She reports that she drinks alcohol. She reports that she does not use drugs. The patient is married and lives in [place]. She is retired.    ALLERGIES: Sulfur   MEDICATIONS:  Current Outpatient Medications  Medication Sig Dispense Refill  . acetaminophen (TYLENOL) 500 MG tablet Take 1,000 mg by mouth every 6 (six) hours as needed for mild pain. Reported on 01/23/2016    . amitriptyline (ELAVIL) 50 MG tablet Take 1 tablet by mouth at bedtime. Reported on 01/23/2016    . Ascorbic Acid (VITAMIN C WITH ROSE HIPS) 1000 MG tablet Take 1,000 mg by mouth daily. Reported on 01/23/2016    . Calcium 500-100 MG-UNIT CHEW Chew by mouth daily.    . cetirizine (ZYRTEC) 10 MG tablet Take 10 mg by mouth daily. Reported on 01/23/2016    . famotidine (PEPCID) 10 MG tablet Take 10 mg by mouth daily as needed for heartburn. Reported on 01/23/2016    . FIBER COMPLETE PO Take by mouth daily.    Marland Kitchen gabapentin (NEURONTIN) 100 MG capsule Take 1 capsule (100 mg total) by mouth at bedtime. Can increase to two capsules at night. 60 capsule 1  . ibuprofen (ADVIL,MOTRIN) 200 MG tablet Take 4 tablets (800 mg total) by mouth every 6 (six) hours as needed for moderate pain. Reported on 01/23/2016 30 tablet 0  . Lactobacillus Rhamnosus, GG, (CULTURELLE PO) Take 1 capsule by mouth daily.    Marland Kitchen losartan-hydrochlorothiazide (HYZAAR) 100-12.5 MG per tablet Take 1 tablet by mouth daily.  Reported on 01/23/2016    . meclizine (ANTIVERT) 25 MG tablet Take 25 mg by mouth 3 (three) times daily as needed for dizziness. Reported on 01/23/2016    . Multiple Vitamin (MULTIVITAMIN WITH MINERALS) TABS tablet Take 1 tablet by mouth daily. Reported on 01/23/2016    . ondansetron (ZOFRAN ODT) 4 MG disintegrating tablet 4mg  ODT q4 hours prn nausea/vomit 10 tablet 0  . pantoprazole (PROTONIX) 40 MG tablet Take 40 mg by mouth daily. Reported on 01/23/2016    . polyethylene glycol (MIRALAX / GLYCOLAX) packet Take 8.5 g by mouth daily as needed for mild constipation. Reported on 01/23/2016    . tizanidine (ZANAFLEX) 2  MG capsule Take 4 mg by mouth at bedtime as needed for muscle spasms. Reported on 01/23/2016    . traMADol (ULTRAM) 50 MG tablet Take 1 tablet by mouth as needed.    Marland Kitchen HYDROcodone-acetaminophen (NORCO) 5-325 MG tablet Take 1-2 tablets by mouth every 6 (six) hours as needed for moderate pain or severe pain. (Patient not taking: Reported on 01/16/2018) 20 tablet 0   No current facility-administered medications for this encounter.      REVIEW OF SYSTEMS: On review of systems, the patient reports that she is doing well overall. She denies any chest pain, shortness of breath, cough, fevers, chills, night sweats, unintended weight changes. She denies any bowel or bladder disturbances, and denies abdominal pain, nausea or vomiting. She denies any new musculoskeletal or joint aches or pains. A complete review of systems is obtained and is otherwise negative.     PHYSICAL EXAM:  Wt Readings from Last 3 Encounters:  01/16/18 119 lb 12.8 oz (54.3 kg)  12/25/17 116 lb (52.6 kg)  11/27/17 116 lb 8 oz (52.8 kg)   Temp Readings from Last 3 Encounters:  01/16/18 98.4 F (36.9 C)  12/25/17 97.6 F (36.4 C)  11/27/17 98 F (36.7 C) (Oral)   BP Readings from Last 3 Encounters:  01/16/18 (!) 145/88  12/25/17 130/83  11/27/17 (!) 156/93   Pulse Readings from Last 3 Encounters:  01/16/18 71    12/25/17 62  11/27/17 81     In general this is a well appearing caucasian female in no acute distress. She is alert and oriented x4 and appropriate throughout the examination. HEENT reveals that the patient is normocephalic, atraumatic. EOMs are intact. Cardiopulmonary assessment is negative for acute distress and she exhibits normal effort. The right breast is intact with a well healed incision site.   ECOG = 0  0 - Asymptomatic (Fully active, able to carry on all predisease activities without restriction)  1 - Symptomatic but completely ambulatory (Restricted in physically strenuous activity but ambulatory and able to carry out work of a light or sedentary nature. For example, light housework, office work)  2 - Symptomatic, <50% in bed during the day (Ambulatory and capable of all self care but unable to carry out any work activities. Up and about more than 50% of waking hours)  3 - Symptomatic, >50% in bed, but not bedbound (Capable of only limited self-care, confined to bed or chair 50% or more of waking hours)  4 - Bedbound (Completely disabled. Cannot carry on any self-care. Totally confined to bed or chair)  5 - Death   Eustace Pen MM, Creech RH, Tormey DC, et al. [phone]). "Toxicity and response criteria of the [facility]". [place] Oncol. 5 (6): 649-55    LABORATORY DATA:  Lab Results  Component Value Date   WBC 6.8 11/27/2017   HGB 12.7 11/27/2017   HCT 37.3 11/27/2017   MCV 92.9 11/27/2017   PLT 330 11/27/2017   Lab Results  Component Value Date   NA 139 11/27/2017   K 3.8 11/27/2017   CL 101 11/27/2017   CO2 28 11/27/2017   Lab Results  Component Value Date   ALT 20 11/27/2017   AST 20 11/27/2017   ALKPHOS 76 11/27/2017   BILITOT 0.4 11/27/2017      RADIOGRAPHY: No results found.     IMPRESSION/PLAN: 1. Low grade, ER/PR positive DCIS of the right breast. Dr. Lisbeth Renshaw reviews the final pathology findings and reviews the nature  of non  invasive breast disease. She is ready to proceed with external radiotherapy to the breast followed by antiestrogen therapy. We discussed the risks, benefits, short, and long term effects of radiotherapy, and the patient is interested in proceeding. Dr. Lisbeth Renshaw discusses the delivery and logistics of radiotherapy and anticipates a course of 4 weeks. Written consent is obtained and placed in the chart, a copy was provided to the patient. She will simulate tomorrow so she can get started on treatment next week, so that she can proceed with a vacation the week of memorial day.  In a visit lasting 25 minutes, greater than 50% of the time was spent face to face discussing her case, and coordinating the patient's care.   The above documentation reflects my direct findings during this shared patient visit. Please see the separate note by Dr. Lisbeth Renshaw on this date for the remainder of the patient's plan of care.    Carola Rhine, PAC

## 2018-01-17 ENCOUNTER — Telehealth: Payer: Self-pay | Admitting: Hematology

## 2018-01-17 ENCOUNTER — Ambulatory Visit
Admission: RE | Admit: 2018-01-17 | Discharge: 2018-01-17 | Disposition: A | Discharge status: Home / Self Care | Payer: BLUE CROSS/BLUE SHIELD | Source: Ambulatory Visit | Attending: Radiation Oncology | Admitting: Radiation Oncology

## 2018-01-17 DIAGNOSIS — D0511 Intraductal carcinoma in situ of right breast: Secondary | ICD-10-CM | POA: Insufficient documentation

## 2018-01-17 DIAGNOSIS — Z510 Encounter for antineoplastic radiation therapy: Secondary | ICD-10-CM | POA: Insufficient documentation

## 2018-01-17 DIAGNOSIS — Z170 Estrogen receptor positive status [ER+]: Secondary | ICD-10-CM | POA: Insufficient documentation

## 2018-01-17 NOTE — Progress Notes (Signed)
  Radiation Oncology         [phone]) [phone] ________________________________  Name: Melissa Mcclure MRN: [id number]  Date: 01/17/2018  DOB: February 23, 1956  Optical Surface Tracking Plan:  Since intensity modulated radiotherapy (IMRT) and 3D conformal radiation treatment methods are predicated on accurate and precise positioning for treatment, intrafraction motion monitoring is medically necessary to ensure accurate and safe treatment delivery.  The ability to quantify intrafraction motion without excessive ionizing radiation dose can only be performed with optical surface tracking. Accordingly, surface imaging offers the opportunity to obtain 3D measurements of patient position throughout IMRT and 3D treatments without excessive radiation exposure.  I am ordering optical surface tracking for this patient's upcoming course of radiotherapy. ________________________________  Kyung Rudd, MD 01/17/2018 10:39 AM    Reference:   Ursula Alert, J, et al. Surface imaging-based analysis of intrafraction motion for breast radiotherapy patients.Journal of [place], n. 6, nov. 2014. ISSN [id number].   Available at: <http://www.jacmp.org/index.php/jacmp/article/view/4957>.

## 2018-01-17 NOTE — Progress Notes (Signed)
  Radiation Oncology         [phone] ________________________________  Name: Melissa Mcclure MRN: [id number]  Date: 01/17/2018  DOB: [birth year]   DIAGNOSIS:     ICD-10-CM   1. Ductal carcinoma in situ (DCIS) of right breast D05.11     SIMULATION AND TREATMENT PLANNING NOTE  The patient presented for simulation prior to beginning her course of radiation treatment for her diagnosis of right-sided breast cancer. The patient was placed in a supine position on a breast board. A customized vac-lock bag was constructed and this complex treatment device will be used on a daily basis during her treatment. In this fashion, a CT scan was obtained through the chest area and an isocenter was placed near the chest wall within the breast.  The patient will be planned to receive a course of radiation initially to a dose of 42.5 Gy. This will consist of a whole breast radiotherapy technique. To accomplish this, 2 customized blocks have been designed which will correspond to medial and lateral whole breast tangent fields. This treatment will be accomplished at 2.5 Gy per fraction. A forward planning technique will also be evaluated to determine if this approach improves the plan. It is anticipated that the patient will then receive a 7.5 Gy boost to the seroma cavity which has been contoured. This will be accomplished at 2.5 Gy per fraction.   This initial treatment will consist of a 3-D conformal technique. The seroma has been contoured as the primary target structure. Additionally, dose volume histograms of both this target as well as the lungs and heart will also be evaluated. Such an approach is necessary to ensure that the target area is adequately covered while the nearby critical  normal structures are adequately spared.  Plan:  The final anticipated total dose therefore will correspond to 50 Gy.    _______________________________   Jodelle Gross, MD, PhD

## 2018-01-17 NOTE — Telephone Encounter (Signed)
Appointments scheduled patient notified of date/time per 4/26 sch msg

## 2018-01-20 ENCOUNTER — Ambulatory Visit
Admission: RE | Admit: 2018-01-20 | Discharge: 2018-01-20 | Disposition: A | Discharge status: Home / Self Care | Payer: BLUE CROSS/BLUE SHIELD | Source: Ambulatory Visit | Attending: Radiation Oncology | Admitting: Radiation Oncology

## 2018-01-20 DIAGNOSIS — Z510 Encounter for antineoplastic radiation therapy: Secondary | ICD-10-CM | POA: Diagnosis not present

## 2018-01-20 DIAGNOSIS — Z170 Estrogen receptor positive status [ER+]: Secondary | ICD-10-CM | POA: Diagnosis not present

## 2018-01-20 DIAGNOSIS — D0511 Intraductal carcinoma in situ of right breast: Secondary | ICD-10-CM | POA: Diagnosis not present

## 2018-01-21 ENCOUNTER — Ambulatory Visit
Admission: RE | Admit: 2018-01-21 | Discharge: 2018-01-21 | Disposition: A | Discharge status: Home / Self Care | Payer: BLUE CROSS/BLUE SHIELD | Source: Ambulatory Visit | Attending: Radiation Oncology | Admitting: Radiation Oncology

## 2018-01-21 DIAGNOSIS — Z510 Encounter for antineoplastic radiation therapy: Secondary | ICD-10-CM | POA: Diagnosis not present

## 2018-01-21 DIAGNOSIS — Z170 Estrogen receptor positive status [ER+]: Secondary | ICD-10-CM | POA: Diagnosis not present

## 2018-01-21 DIAGNOSIS — D0511 Intraductal carcinoma in situ of right breast: Secondary | ICD-10-CM | POA: Diagnosis not present

## 2018-01-22 ENCOUNTER — Ambulatory Visit
Admission: RE | Admit: 2018-01-22 | Discharge: 2018-01-22 | Disposition: A | Discharge status: Home / Self Care | Payer: BLUE CROSS/BLUE SHIELD | Source: Ambulatory Visit | Attending: Radiation Oncology | Admitting: Radiation Oncology

## 2018-01-22 DIAGNOSIS — Z510 Encounter for antineoplastic radiation therapy: Secondary | ICD-10-CM | POA: Diagnosis not present

## 2018-01-22 DIAGNOSIS — D0511 Intraductal carcinoma in situ of right breast: Secondary | ICD-10-CM | POA: Insufficient documentation

## 2018-01-23 ENCOUNTER — Ambulatory Visit
Admission: RE | Admit: 2018-01-23 | Discharge: 2018-01-23 | Disposition: A | Discharge status: Home / Self Care | Payer: BLUE CROSS/BLUE SHIELD | Source: Ambulatory Visit | Attending: Radiation Oncology | Admitting: Radiation Oncology

## 2018-01-23 DIAGNOSIS — D0511 Intraductal carcinoma in situ of right breast: Secondary | ICD-10-CM | POA: Diagnosis not present

## 2018-01-23 DIAGNOSIS — Z510 Encounter for antineoplastic radiation therapy: Secondary | ICD-10-CM | POA: Diagnosis not present

## 2018-01-24 ENCOUNTER — Ambulatory Visit
Admission: RE | Admit: 2018-01-24 | Discharge: 2018-01-24 | Disposition: A | Discharge status: Home / Self Care | Payer: BLUE CROSS/BLUE SHIELD | Source: Ambulatory Visit | Attending: Radiation Oncology | Admitting: Radiation Oncology

## 2018-01-24 ENCOUNTER — Ambulatory Visit: Payer: BLUE CROSS/BLUE SHIELD | Admitting: Radiation Oncology

## 2018-01-24 ENCOUNTER — Ambulatory Visit (INDEPENDENT_AMBULATORY_CARE_PROVIDER_SITE_OTHER): Payer: BLUE CROSS/BLUE SHIELD | Admitting: Obstetrics & Gynecology

## 2018-01-24 ENCOUNTER — Encounter: Payer: Self-pay | Admitting: Obstetrics & Gynecology

## 2018-01-24 ENCOUNTER — Other Ambulatory Visit: Payer: Self-pay

## 2018-01-24 VITALS — BP 118/80 | HR 76 | Resp 14 | Ht 62.5 in | Wt 118.0 lb

## 2018-01-24 DIAGNOSIS — Z01419 Encounter for gynecological examination (general) (routine) without abnormal findings: Secondary | ICD-10-CM | POA: Diagnosis not present

## 2018-01-24 DIAGNOSIS — D0511 Intraductal carcinoma in situ of right breast: Secondary | ICD-10-CM

## 2018-01-24 DIAGNOSIS — Z510 Encounter for antineoplastic radiation therapy: Secondary | ICD-10-CM | POA: Diagnosis not present

## 2018-01-24 MED ORDER — ALRA NON-METALLIC DEODORANT (RAD-ONC)
1.0000 "application " | Freq: Once | TOPICAL | Status: AC
  Administered 2018-01-24: 1 via TOPICAL

## 2018-01-24 MED ORDER — RADIAPLEXRX EX GEL
Freq: Once | CUTANEOUS | Status: AC
  Administered 2018-01-24: 16:00:00 via TOPICAL

## 2018-01-24 NOTE — Progress Notes (Signed)
Pt here for patient teaching.  Pt given Radiation and You booklet, skin care instructions, Alra deodorant and Radiaplex gel.  Reviewed areas of pertinence such as fatigue, hair loss, skin changes, breast tenderness and breast swelling . Pt able to give teach back of to pat skin and use unscented/gentle soap,apply Radiaplex bid, avoid applying anything to skin within 4 hours of treatment and to use an electric razor if they must shave. Pt verbalizes understanding of information given and will contact nursing with any questions or concerns.     Cori Razor, RN

## 2018-01-24 NOTE — Progress Notes (Signed)
62 y.o. G3P3 MarriedCaucasianF here for annual exam.  Undergoing radiation currently for DCIS.  Lumpectomy showed DCIS only.  Doing 4 weeks of radiation.  Has done one week of radiation.    Having some hot flashes.  Tried Gabapentin for hot flashes.  Increased to 200mg  just for a day.  Not sure if this is the right dosage for her.    Is going to her her hernia repaired this summer/fall.  Patient's last menstrual period was 06/24/2008.          Sexually active: No.  The current method of family planning is status post hysterectomy.    Exercising: Yes.    walking  Smoker:  no  Health Maintenance: Pap:  12/30/15 neg. HR HPV:neg   10/25/14 Neg  History of abnormal Pap:  no MMG:  10/30/17 Right  DCIS  Colonoscopy:  09/10/14 f/u 3 years.  Pt aware this is due and is planning on doing this after radiation is complete. BMD:   10/30/17 Normal  TDaP:  PCP Pneumonia vaccine(s):  PCP Shingrix: PCP Hep C testing: PCP Screening Labs: PCP   reports that she has never smoked. She has never used smokeless tobacco. She reports that she drinks alcohol. She reports that she does not use drugs.  Past Medical History:  Diagnosis Date  . Acid reflux    GERD  . Cancer ([place]) 08,11   melanoma-left knee, right arm  . Chronic back pain   . Complication of anesthesia   . Ductal carcinoma in situ (DCIS) of right breast 10/2017  . Family history of adverse reaction to anesthesia    mother has PONV  . Family history of colon cancer   . Family history of melanoma   . Family history of ovarian cancer   . Genetic testing 12/23/2017   STAT Breast panel with reflex to Multi-Cancer panel (83 genes) @ [facility] - No pathogenic mutations detected  . GERD (gastroesophageal reflux disease)   . H/O hiatal hernia   . Heart murmur    no problems  . Hypertension   . PONV (postoperative nausea and vomiting)    "zofran works great  . Seasonal allergies   . SVD (spontaneous vaginal delivery)    x 1  . Vertigo     Past  Surgical History:  Procedure Laterality Date  . ABDOMINAL HYSTERECTOMY    . ANTERIOR LAT LUMBAR FUSION Right 04/17/2013   Procedure: ANTERIOR LATERAL LUMBAR FUSION 3 LEVELS;  Surgeon: Erline Levine, MD;  Location: [facility];  Service: Neurosurgery;  Laterality: Right;  Right Lumbar Two-Three Lumbar Three-Four Lumbar Four-Five  Anterolateral Fusion  . BREAST LUMPECTOMY WITH RADIOACTIVE SEED LOCALIZATION Right 12/25/2017   Procedure: RIGHT BREAST LUMPECTOMY WITH RADIOACTIVE SEED LOCALIZATION;  Surgeon: Fanny Skates, MD;  Location: [facility];  Service: General;  Laterality: Right;  . [place]  . CESAREAN SECTION  1980  . COLONOSCOPY    . CYSTOSCOPY N/A 06/26/2016   Procedure: CYSTOSCOPY;  Surgeon: Megan Salon, MD;  Location: [facility];  Service: Gynecology;  Laterality: N/A;  . DILATATION & CURETTAGE/HYSTEROSCOPY WITH MYOSURE N/A 01/24/2016   Procedure: DILATATION & CURETTAGE/HYSTEROSCOPY WITH MYOSURE;  Surgeon: Megan Salon, MD;  Location: [facility];  Service: Gynecology;  Laterality: N/A;  . FOOT SURGERY Right    early 20s-bone spur removal  . LAPAROSCOPIC HYSTERECTOMY Bilateral 06/26/2016   Procedure: HYSTERECTOMY TOTAL LAPAROSCOPIC;  Surgeon: Megan Salon, MD;  Location: [facility];  Service: Gynecology;  Laterality:  Bilateral;  . LUMBAR PERCUTANEOUS PEDICLE SCREW 3 LEVEL N/A 04/17/2013   Procedure: LUMBAR PERCUTANEOUS PEDICLE SCREW 3 LEVEL;  Surgeon: Erline Levine, MD;  Location: [facility];  Service: Neurosurgery;  Laterality: N/A;  Decompression and Posterior Lumbar Interbody Fusion of Lumbar Five-Sacral One, Percutaneous Screws Lumbar Two through Sacral One  . MELANOMA EXCISION     knee 08 lft. elbow 11rt  . SALPINGOOPHORECTOMY Bilateral 06/26/2016   Procedure: SALPINGO OOPHORECTOMY;  Surgeon: Megan Salon, MD;  Location: [facility];  Service: Gynecology;  Laterality: Bilateral;  . SHOULDER SURGERY Right 04/28/2015  . TUBAL LIGATION  1986  . WLE  06/17/07   and left  groin lymph node    Current Outpatient Medications  Medication Sig Dispense Refill  . acetaminophen (TYLENOL) 500 MG tablet Take 1,000 mg by mouth every 6 (six) hours as needed for mild pain. Reported on 01/23/2016    . amitriptyline (ELAVIL) 50 MG tablet Take 1 tablet by mouth at bedtime. Reported on 01/23/2016    . Ascorbic Acid (VITAMIN C WITH ROSE HIPS) 1000 MG tablet Take 1,000 mg by mouth daily. Reported on 01/23/2016    . Calcium 500-100 MG-UNIT CHEW Chew by mouth daily.    . cetirizine (ZYRTEC) 10 MG tablet Take 10 mg by mouth daily. Reported on 01/23/2016    . famotidine (PEPCID) 10 MG tablet Take 10 mg by mouth daily as needed for heartburn. Reported on 01/23/2016    . FIBER COMPLETE PO Take by mouth daily.    Marland Kitchen gabapentin (NEURONTIN) 100 MG capsule Take 1 capsule (100 mg total) by mouth at bedtime. Can increase to two capsules at night. 60 capsule 1  . HYDROcodone-acetaminophen (NORCO) 5-325 MG tablet Take 1-2 tablets by mouth every 6 (six) hours as needed for moderate pain or severe pain. 20 tablet 0  . ibuprofen (ADVIL,MOTRIN) 200 MG tablet Take 4 tablets (800 mg total) by mouth every 6 (six) hours as needed for moderate pain. Reported on 01/23/2016 30 tablet 0  . Lactobacillus Rhamnosus, GG, (CULTURELLE PO) Take 1 capsule by mouth daily.    Marland Kitchen losartan-hydrochlorothiazide (HYZAAR) 100-12.5 MG per tablet Take 1 tablet by mouth daily. Reported on 01/23/2016    . meclizine (ANTIVERT) 25 MG tablet Take 25 mg by mouth 3 (three) times daily as needed for dizziness. Reported on 01/23/2016    . Multiple Vitamin (MULTIVITAMIN WITH MINERALS) TABS tablet Take 1 tablet by mouth daily. Reported on 01/23/2016    . ondansetron (ZOFRAN ODT) 4 MG disintegrating tablet 4mg  ODT q4 hours prn nausea/vomit 10 tablet 0  . pantoprazole (PROTONIX) 40 MG tablet Take 40 mg by mouth daily. Reported on 01/23/2016    . polyethylene glycol (MIRALAX / GLYCOLAX) packet Take 8.5 g by mouth daily as needed for mild constipation.  Reported on 01/23/2016    . tizanidine (ZANAFLEX) 2 MG capsule Take 4 mg by mouth at bedtime as needed for muscle spasms. Reported on 01/23/2016    . traMADol (ULTRAM) 50 MG tablet Take 1 tablet by mouth as needed.     No current facility-administered medications for this visit.    Facility-Administered Medications Ordered in Other Visits  Medication Dose Route Frequency Provider Last Rate Last Dose  . hyaluronate sodium (RADIAPLEXRX) gel   Topical Once Hayden Pedro, PA-C      . non-metallic deodorant Jethro Poling) 1 application  1 application Topical Once Hayden Pedro, PA-C        Family History  Problem Relation Age of  Onset  . Diabetes Father        borderline  . Melanoma Father        dx 76s/60s; deceased 47s  . Congestive Heart Failure Father   . Colon cancer Mother        dx 86s; deceased 21  . Hypertension Mother   . Osteoporosis Mother   . Ovarian cancer Maternal Grandmother        deceased 54s  . Heart disease Paternal Grandfather   . Melanoma Daughter        on leg; dx 70s    Review of Systems  All other systems reviewed and are negative.   Exam:   BP 118/80 (BP Location: Right Arm, Patient Position: Sitting, Cuff Size: Normal)   Pulse 76   Resp 14   Ht 5' 2.5" (1.588 m)   Wt 118 lb (53.5 kg)   LMP 06/24/2008   BMI 21.24 kg/m    Height: 5' 2.5" (158.8 cm)  Ht Readings from Last 3 Encounters:  01/24/18 5' 2.5" (1.588 m)  01/16/18 5\' 2"  (1.575 m)  12/25/17 5\' 2"  (1.575 m)    General appearance: alert, cooperative and appears stated age Head: Normocephalic, without obvious abnormality, atraumatic Neck: no adenopathy, supple, symmetrical, trachea midline and thyroid normal to inspection and palpation Lungs: clear to auscultation bilaterally Breasts: normal appearance, no masses or tenderness Heart: regular rate and rhythm Abdomen: soft, non-tender; bowel sounds normal; no masses,  no organomegaly Extremities: extremities normal, atraumatic, no  cyanosis or edema Skin: Skin color, texture, turgor normal. No rashes or lesions Lymph nodes: Cervical, supraclavicular, and axillary nodes normal. No abnormal inguinal nodes palpated Neurologic: Grossly normal   Pelvic: External genitalia:  no lesions              Urethra:  normal appearing urethra with no masses, tenderness or lesions              Bartholins and Skenes: normal                 Vagina: normal appearing vagina with normal color and discharge, no lesions              Cervix: absent              Pap taken: No. Bimanual Exam:  Uterus:  uterus absent              Adnexa: no mass, fullness, tenderness               Rectovaginal: Confirms               Anus:  normal sphincter tone, no lesions  Chaperone was present for exam.  A:  Well Woman with normal exam PMP, no HRT TLH due to persistent PMP bleeding H/O vaginal cuff granulation tissue post op H/o melanoma  P:   Mammogram guidelines reviewed pap smear not indicated Lab work not needed today Seeing Dr. Nevada Crane yearly On gabapentin 200mg  nightly.  Will need RF in another month. Return annually or prn

## 2018-01-27 ENCOUNTER — Ambulatory Visit
Admission: RE | Admit: 2018-01-27 | Discharge: 2018-01-27 | Disposition: A | Discharge status: Home / Self Care | Payer: BLUE CROSS/BLUE SHIELD | Source: Ambulatory Visit | Attending: Radiation Oncology | Admitting: Radiation Oncology

## 2018-01-27 DIAGNOSIS — D0511 Intraductal carcinoma in situ of right breast: Secondary | ICD-10-CM | POA: Diagnosis not present

## 2018-01-27 DIAGNOSIS — Z510 Encounter for antineoplastic radiation therapy: Secondary | ICD-10-CM | POA: Diagnosis not present

## 2018-01-28 ENCOUNTER — Ambulatory Visit: Payer: BLUE CROSS/BLUE SHIELD

## 2018-01-28 ENCOUNTER — Ambulatory Visit
Admission: RE | Admit: 2018-01-28 | Discharge: 2018-01-28 | Disposition: A | Discharge status: Home / Self Care | Payer: BLUE CROSS/BLUE SHIELD | Source: Ambulatory Visit | Attending: Radiation Oncology | Admitting: Radiation Oncology

## 2018-01-28 DIAGNOSIS — D0511 Intraductal carcinoma in situ of right breast: Secondary | ICD-10-CM | POA: Diagnosis not present

## 2018-01-28 DIAGNOSIS — Z510 Encounter for antineoplastic radiation therapy: Secondary | ICD-10-CM | POA: Diagnosis not present

## 2018-01-29 ENCOUNTER — Ambulatory Visit
Admission: RE | Admit: 2018-01-29 | Discharge: 2018-01-29 | Disposition: A | Discharge status: Home / Self Care | Payer: BLUE CROSS/BLUE SHIELD | Source: Ambulatory Visit | Attending: Radiation Oncology | Admitting: Radiation Oncology

## 2018-01-29 ENCOUNTER — Ambulatory Visit: Payer: BLUE CROSS/BLUE SHIELD

## 2018-01-29 DIAGNOSIS — Z510 Encounter for antineoplastic radiation therapy: Secondary | ICD-10-CM | POA: Diagnosis not present

## 2018-01-29 DIAGNOSIS — D0511 Intraductal carcinoma in situ of right breast: Secondary | ICD-10-CM | POA: Diagnosis not present

## 2018-01-30 ENCOUNTER — Ambulatory Visit: Payer: BLUE CROSS/BLUE SHIELD

## 2018-01-30 ENCOUNTER — Ambulatory Visit
Admission: RE | Admit: 2018-01-30 | Discharge: 2018-01-30 | Disposition: A | Discharge status: Home / Self Care | Payer: BLUE CROSS/BLUE SHIELD | Source: Ambulatory Visit | Attending: Radiation Oncology | Admitting: Radiation Oncology

## 2018-01-30 DIAGNOSIS — D0511 Intraductal carcinoma in situ of right breast: Secondary | ICD-10-CM | POA: Diagnosis not present

## 2018-01-30 DIAGNOSIS — Z510 Encounter for antineoplastic radiation therapy: Secondary | ICD-10-CM | POA: Diagnosis not present

## 2018-01-31 ENCOUNTER — Ambulatory Visit
Admission: RE | Admit: 2018-01-31 | Discharge: 2018-01-31 | Disposition: A | Discharge status: Home / Self Care | Payer: BLUE CROSS/BLUE SHIELD | Source: Ambulatory Visit | Attending: Radiation Oncology | Admitting: Radiation Oncology

## 2018-01-31 ENCOUNTER — Ambulatory Visit: Payer: BLUE CROSS/BLUE SHIELD

## 2018-01-31 DIAGNOSIS — R42 Dizziness and giddiness: Secondary | ICD-10-CM | POA: Diagnosis not present

## 2018-01-31 DIAGNOSIS — Z0000 Encounter for general adult medical examination without abnormal findings: Secondary | ICD-10-CM | POA: Diagnosis not present

## 2018-01-31 DIAGNOSIS — Z510 Encounter for antineoplastic radiation therapy: Secondary | ICD-10-CM | POA: Diagnosis not present

## 2018-01-31 DIAGNOSIS — G4700 Insomnia, unspecified: Secondary | ICD-10-CM | POA: Diagnosis not present

## 2018-01-31 DIAGNOSIS — D0511 Intraductal carcinoma in situ of right breast: Secondary | ICD-10-CM | POA: Diagnosis not present

## 2018-01-31 DIAGNOSIS — I10 Essential (primary) hypertension: Secondary | ICD-10-CM | POA: Diagnosis not present

## 2018-02-03 ENCOUNTER — Ambulatory Visit
Admission: RE | Admit: 2018-02-03 | Discharge: 2018-02-03 | Disposition: A | Discharge status: Home / Self Care | Payer: BLUE CROSS/BLUE SHIELD | Source: Ambulatory Visit | Attending: Radiation Oncology | Admitting: Radiation Oncology

## 2018-02-03 DIAGNOSIS — Z510 Encounter for antineoplastic radiation therapy: Secondary | ICD-10-CM | POA: Diagnosis not present

## 2018-02-03 DIAGNOSIS — D0511 Intraductal carcinoma in situ of right breast: Secondary | ICD-10-CM | POA: Diagnosis not present

## 2018-02-04 ENCOUNTER — Ambulatory Visit
Admission: RE | Admit: 2018-02-04 | Discharge: 2018-02-04 | Disposition: A | Discharge status: Home / Self Care | Payer: BLUE CROSS/BLUE SHIELD | Source: Ambulatory Visit | Attending: Radiation Oncology | Admitting: Radiation Oncology

## 2018-02-04 DIAGNOSIS — D0511 Intraductal carcinoma in situ of right breast: Secondary | ICD-10-CM | POA: Diagnosis not present

## 2018-02-04 DIAGNOSIS — Z510 Encounter for antineoplastic radiation therapy: Secondary | ICD-10-CM | POA: Diagnosis not present

## 2018-02-05 ENCOUNTER — Ambulatory Visit
Admission: RE | Admit: 2018-02-05 | Discharge: 2018-02-05 | Disposition: A | Discharge status: Home / Self Care | Payer: BLUE CROSS/BLUE SHIELD | Source: Ambulatory Visit | Attending: Radiation Oncology | Admitting: Radiation Oncology

## 2018-02-05 DIAGNOSIS — D0511 Intraductal carcinoma in situ of right breast: Secondary | ICD-10-CM | POA: Diagnosis not present

## 2018-02-05 DIAGNOSIS — Z510 Encounter for antineoplastic radiation therapy: Secondary | ICD-10-CM | POA: Diagnosis not present

## 2018-02-06 ENCOUNTER — Ambulatory Visit
Admission: RE | Admit: 2018-02-06 | Discharge: 2018-02-06 | Disposition: A | Discharge status: Home / Self Care | Payer: BLUE CROSS/BLUE SHIELD | Source: Ambulatory Visit | Attending: Radiation Oncology | Admitting: Radiation Oncology

## 2018-02-06 DIAGNOSIS — Z510 Encounter for antineoplastic radiation therapy: Secondary | ICD-10-CM | POA: Diagnosis not present

## 2018-02-06 DIAGNOSIS — D0511 Intraductal carcinoma in situ of right breast: Secondary | ICD-10-CM | POA: Diagnosis not present

## 2018-02-07 ENCOUNTER — Ambulatory Visit
Admission: RE | Admit: 2018-02-07 | Discharge: 2018-02-07 | Disposition: A | Discharge status: Home / Self Care | Payer: BLUE CROSS/BLUE SHIELD | Source: Ambulatory Visit | Attending: Radiation Oncology | Admitting: Radiation Oncology

## 2018-02-07 ENCOUNTER — Ambulatory Visit: Payer: BLUE CROSS/BLUE SHIELD | Admitting: Radiation Oncology

## 2018-02-07 DIAGNOSIS — Z510 Encounter for antineoplastic radiation therapy: Secondary | ICD-10-CM | POA: Diagnosis not present

## 2018-02-07 DIAGNOSIS — D0511 Intraductal carcinoma in situ of right breast: Secondary | ICD-10-CM | POA: Diagnosis not present

## 2018-02-10 ENCOUNTER — Ambulatory Visit
Admission: RE | Admit: 2018-02-10 | Discharge: 2018-02-10 | Disposition: A | Discharge status: Home / Self Care | Payer: BLUE CROSS/BLUE SHIELD | Source: Ambulatory Visit | Attending: Radiation Oncology | Admitting: Radiation Oncology

## 2018-02-10 DIAGNOSIS — Z510 Encounter for antineoplastic radiation therapy: Secondary | ICD-10-CM | POA: Diagnosis not present

## 2018-02-10 DIAGNOSIS — D0511 Intraductal carcinoma in situ of right breast: Secondary | ICD-10-CM | POA: Diagnosis not present

## 2018-02-11 ENCOUNTER — Ambulatory Visit
Admission: RE | Admit: 2018-02-11 | Discharge: 2018-02-11 | Disposition: A | Discharge status: Home / Self Care | Payer: BLUE CROSS/BLUE SHIELD | Source: Ambulatory Visit | Attending: Radiation Oncology | Admitting: Radiation Oncology

## 2018-02-11 ENCOUNTER — Ambulatory Visit: Payer: BLUE CROSS/BLUE SHIELD | Admitting: Obstetrics & Gynecology

## 2018-02-11 DIAGNOSIS — Z510 Encounter for antineoplastic radiation therapy: Secondary | ICD-10-CM | POA: Diagnosis not present

## 2018-02-11 DIAGNOSIS — D0511 Intraductal carcinoma in situ of right breast: Secondary | ICD-10-CM | POA: Diagnosis not present

## 2018-02-12 ENCOUNTER — Ambulatory Visit
Admission: RE | Admit: 2018-02-12 | Discharge: 2018-02-12 | Disposition: A | Discharge status: Home / Self Care | Payer: BLUE CROSS/BLUE SHIELD | Source: Ambulatory Visit | Attending: Radiation Oncology | Admitting: Radiation Oncology

## 2018-02-12 DIAGNOSIS — Z510 Encounter for antineoplastic radiation therapy: Secondary | ICD-10-CM | POA: Diagnosis not present

## 2018-02-12 DIAGNOSIS — D0511 Intraductal carcinoma in situ of right breast: Secondary | ICD-10-CM | POA: Diagnosis not present

## 2018-02-13 ENCOUNTER — Ambulatory Visit
Admission: RE | Admit: 2018-02-13 | Discharge: 2018-02-13 | Disposition: A | Discharge status: Home / Self Care | Payer: BLUE CROSS/BLUE SHIELD | Source: Ambulatory Visit | Attending: Radiation Oncology | Admitting: Radiation Oncology

## 2018-02-13 DIAGNOSIS — D0511 Intraductal carcinoma in situ of right breast: Secondary | ICD-10-CM | POA: Diagnosis not present

## 2018-02-13 DIAGNOSIS — Z510 Encounter for antineoplastic radiation therapy: Secondary | ICD-10-CM | POA: Diagnosis not present

## 2018-02-13 NOTE — Progress Notes (Signed)
[place]  [phone] [phone]  Clinic Follow Up Note   Patient Care Team: Maury Dus, MD as PCP - General (Family Medicine)   Date of Service:  02/14/2018  CHIEF COMPLAINTS/PURPOSE OF CONSULTATION:  Ductal carcinoma in situ (DCIS) of right breast  Oncology History   Cancer Staging Ductal carcinoma in situ (DCIS) of right breast Staging form: Breast, AJCC 8th Edition - Clinical stage from 11/19/2017: Stage 0 (cTis (DCIS), cN0, cM0, ER: Positive, PR: Positive, HER2: Not assessed ) - Signed by Truitt Merle, MD on 11/26/2017       Ductal carcinoma in situ (DCIS) of right breast   11/07/2017 Mammogram     Diagnostic Mammogram Right breast 11/07/17 IMPRESSION:  The results of the exam were reviewed with the patient.  The 3 cm are of grouped branching linear calcifications in the right breast lower inner quadrant middle depth are at moderate concern but not classic for malignancy.  A Stereotactic biopsy is recommended.      11/19/2017 Initial Biopsy    Diagnosis 11/19/17 Breast, right, needle core biopsy - DUCTAL CARCINOMA IN SITU WITH CALCIFICATIONS. - SEE COMMENT.       11/19/2017 Receptors her2    Estrogen Receptor: 90%, POSITIVE, STRONG STAINING INTENSITY Progesterone Receptor: 25%, POSITIVE, STRONG STAINING INTENSITY       11/19/2017 Initial Diagnosis    Ductal carcinoma in situ (DCIS) of right breast      12/25/2017 Pathology Results    Diagnosis, 12/25/2017 Breast, lumpectomy, Right w/seed - DUCTAL CARCINOMA IN SITU WITH CALCIFICATIONS, LOW GRADE, SPANNING 1.3 CM. - THE SURGICAL RESECTION MARGINS ARE NEGATIVE FOR CARCINOMA        HISTORY OF PRESENTING ILLNESS: 11/27/17 Melissa Mcclure 62 y.o. female is a here because of newly diagnosed right breast cancer. The patient presents to breast clinic today accompanied by her husband and daughter.  Her mass was found by screening mammogram which she gets yearly. She had only one abnormal  mammogram due to dense tissue breast. She notes her biopsy went well and has some bruising at site.    Today the patient notes she has trigger finger in her right thumb and pain in her back from previous surgery. She is overall able to be active and be independent.  She lives with her husband and has 3 children.    In the past the patient was diagnosed with hiatal hernia, acid reflux and heart murmur. She also has an abdominal hernia. She does not plan to do repair soon. She has HTN which is well controlled on medication. She has had orthopedic surgeries on her back and shoulder. Her maternal grandmother had ovarian cancer, her mother had colon cancer.  She had hysterectomy after menopause started due to continued bleeding. She started menopause in her late 56s and started hormonal replacements due to significant symptoms. She was on a estrogen mist then progressed to the Estradial patch. She reduced dose of patch after hysterectomy.      GYN HISTORY  Menarchal: 12-13 LMP: Late 73s Contraceptive: HRT: Yes, Estradial patch 0.0325m  ended 11/20/17 G3P3: first child at 89yo  INTERVAL HISTORY:  Melissa HOLBEINpresents to the office for follow up. She presents to the office by herself today. she is doing well overall. She just completed radiation treatments today with her dates being 01/20/2018-02/14/2018.   Since her last visit to the office, she has had a right lumpectomy on 12/25/2017 with results showing: Ductal carcinoma in situ with  calcifications, low grade, spanning 1.3 cm. The surgical resection margins are negative for carcinoma.   She also had genetic testing completed on 12/14/2017 with results of: Negative result. No Pathogenic sequence variants or deletions/duplications Identified.  On review of systems, she reports redness and itching to radiation site, fatigue. she denies any other symptoms. Pertinent positives are listed and detailed within the above HPI.    MEDICAL HISTORY:  Past  Medical History:  Diagnosis Date  . Acid reflux    GERD  . Cancer ([place]) 08,11   melanoma-left knee, right arm  . Chronic back pain   . Complication of anesthesia   . Ductal carcinoma in situ (DCIS) of right breast 10/2017  . Family history of adverse reaction to anesthesia    mother has PONV  . Family history of colon cancer   . Family history of melanoma   . Family history of ovarian cancer   . Genetic testing 12/23/2017   STAT Breast panel with reflex to Multi-Cancer panel (83 genes) @ [facility] - No pathogenic mutations detected  . GERD (gastroesophageal reflux disease)   . H/O hiatal hernia   . Heart murmur    no problems  . Hypertension   . PONV (postoperative nausea and vomiting)    "zofran works great  . Seasonal allergies   . SVD (spontaneous vaginal delivery)    x 1  . Vertigo     SURGICAL HISTORY: Past Surgical History:  Procedure Laterality Date  . ANTERIOR LAT LUMBAR FUSION Right 04/17/2013   Procedure: ANTERIOR LATERAL LUMBAR FUSION 3 LEVELS;  Surgeon: Erline Levine, MD;  Location: [facility];  Service: Neurosurgery;  Laterality: Right;  Right Lumbar Two-Three Lumbar Three-Four Lumbar Four-Five  Anterolateral Fusion  . BREAST LUMPECTOMY WITH RADIOACTIVE SEED LOCALIZATION Right 12/25/2017   Procedure: RIGHT BREAST LUMPECTOMY WITH RADIOACTIVE SEED LOCALIZATION;  Surgeon: Fanny Skates, MD;  Location: [facility];  Service: General;  Laterality: Right;  . [place]  . CESAREAN SECTION  1980  . COLONOSCOPY    . CYSTOSCOPY N/A 06/26/2016   Procedure: CYSTOSCOPY;  Surgeon: Megan Salon, MD;  Location: [facility];  Service: Gynecology;  Laterality: N/A;  . DILATATION & CURETTAGE/HYSTEROSCOPY WITH MYOSURE N/A 01/24/2016   Procedure: DILATATION & CURETTAGE/HYSTEROSCOPY WITH MYOSURE;  Surgeon: Megan Salon, MD;  Location: [facility];  Service: Gynecology;  Laterality: N/A;  . FOOT SURGERY Right    early 20s-bone spur removal  . LAPAROSCOPIC HYSTERECTOMY  Bilateral 06/26/2016   Procedure: HYSTERECTOMY TOTAL LAPAROSCOPIC;  Surgeon: Megan Salon, MD;  Location: [facility];  Service: Gynecology;  Laterality: Bilateral;  . LUMBAR PERCUTANEOUS PEDICLE SCREW 3 LEVEL N/A 04/17/2013   Procedure: LUMBAR PERCUTANEOUS PEDICLE SCREW 3 LEVEL;  Surgeon: Erline Levine, MD;  Location: [facility];  Service: Neurosurgery;  Laterality: N/A;  Decompression and Posterior Lumbar Interbody Fusion of Lumbar Five-Sacral One, Percutaneous Screws Lumbar Two through Sacral One  . MELANOMA EXCISION     knee 08 lft. elbow 11rt  . SALPINGOOPHORECTOMY Bilateral 06/26/2016   Procedure: SALPINGO OOPHORECTOMY;  Surgeon: Megan Salon, MD;  Location: [facility];  Service: Gynecology;  Laterality: Bilateral;  . SHOULDER SURGERY Right 04/28/2015  . TUBAL LIGATION  1986  . WLE  06/17/07   and left groin lymph node    SOCIAL HISTORY: Social History   Socioeconomic History  . Marital status: Married    Spouse name: Not on file  . Number of children: Not on file  .  Years of education: Not on file  . Highest education level: Not on file  Occupational History  . Not on file  Social Needs  . Financial resource strain: Not on file  . Food insecurity:    Worry: Not on file    Inability: Not on file  . Transportation needs:    Medical: Not on file    Non-medical: Not on file  Tobacco Use  . Smoking status: Never Smoker  . Smokeless tobacco: Never Used  Substance and Sexual Activity  . Alcohol use: Yes    Alcohol/week: 0.0 oz    Comment: ocassional wine  . Drug use: No  . Sexual activity: Yes    Partners: Male    Birth control/protection: Surgical, Post-menopausal    Comment: BTL  Lifestyle  . Physical activity:    Days per week: Not on file    Minutes per session: Not on file  . Stress: Not on file  Relationships  . Social connections:    Talks on phone: Not on file    Gets together: Not on file    Attends religious service: Not on file    Active member of club or  organization: Not on file    Attends meetings of clubs or organizations: Not on file    Relationship status: Not on file  . Intimate partner violence:    Fear of current or ex partner: Not on file    Emotionally abused: Not on file    Physically abused: Not on file    Forced sexual activity: Not on file  Other Topics Concern  . Not on file  Social History Narrative  . Not on file    FAMILY HISTORY: Family History  Problem Relation Age of Onset  . Diabetes Father        borderline  . Melanoma Father        dx 87s/60s; deceased 65s  . Congestive Heart Failure Father   . Colon cancer Mother        dx 58s; deceased 34  . Hypertension Mother   . Osteoporosis Mother   . Ovarian cancer Maternal Grandmother        deceased 72s  . Heart disease Paternal Grandfather   . Melanoma Daughter        on leg; dx 55s    ALLERGIES:  is allergic to sulfur.  MEDICATIONS:  Current Outpatient Medications  Medication Sig Dispense Refill  . acetaminophen (TYLENOL) 500 MG tablet Take 1,000 mg by mouth every 6 (six) hours as needed for mild pain. Reported on 01/23/2016    . amitriptyline (ELAVIL) 50 MG tablet Take 1 tablet by mouth at bedtime. Reported on 01/23/2016    . Ascorbic Acid (VITAMIN C WITH ROSE HIPS) 1000 MG tablet Take 1,000 mg by mouth daily. Reported on 01/23/2016    . Calcium 500-100 MG-UNIT CHEW Chew by mouth daily.    . cetirizine (ZYRTEC) 10 MG tablet Take 10 mg by mouth daily. Reported on 01/23/2016    . famotidine (PEPCID) 10 MG tablet Take 10 mg by mouth daily as needed for heartburn. Reported on 01/23/2016    . FIBER COMPLETE PO Take by mouth daily.    Marland Kitchen gabapentin (NEURONTIN) 100 MG capsule Take 1 capsule (100 mg total) by mouth at bedtime. Can increase to two capsules at night. (Patient taking differently: Take 200 mg by mouth at bedtime. Can increase to two capsules at night.) 60 capsule 1  . HYDROcodone-acetaminophen (NORCO) 5-325 MG tablet  Take 1-2 tablets by mouth every 6 (six)  hours as needed for moderate pain or severe pain. 20 tablet 0  . ibuprofen (ADVIL,MOTRIN) 200 MG tablet Take 4 tablets (800 mg total) by mouth every 6 (six) hours as needed for moderate pain. Reported on 01/23/2016 30 tablet 0  . Lactobacillus Rhamnosus, GG, (CULTURELLE PO) Take 1 capsule by mouth daily.    Marland Kitchen losartan-hydrochlorothiazide (HYZAAR) 100-12.5 MG per tablet Take 1 tablet by mouth daily. Reported on 01/23/2016    . meclizine (ANTIVERT) 25 MG tablet Take 25 mg by mouth 3 (three) times daily as needed for dizziness. Reported on 01/23/2016    . Multiple Vitamin (MULTIVITAMIN WITH MINERALS) TABS tablet Take 1 tablet by mouth daily. Reported on 01/23/2016    . ondansetron (ZOFRAN ODT) 4 MG disintegrating tablet 3m ODT q4 hours prn nausea/vomit 10 tablet 0  . pantoprazole (PROTONIX) 40 MG tablet Take 40 mg by mouth daily. Reported on 01/23/2016    . polyethylene glycol (MIRALAX / GLYCOLAX) packet Take 8.5 g by mouth daily as needed for mild constipation. Reported on 01/23/2016    . tizanidine (ZANAFLEX) 2 MG capsule Take 4 mg by mouth at bedtime as needed for muscle spasms. Reported on 01/23/2016    . traMADol (ULTRAM) 50 MG tablet Take 1 tablet by mouth as needed.     No current facility-administered medications for this visit.    REVIEW OF SYSTEMS:   Constitutional: Denies fevers, chills or abnormal night sweats Eyes: Denies blurriness of vision, double vision or watery eyes Ears, nose, mouth, throat, and face: Denies mucositis or sore throat Respiratory: Denies cough, dyspnea or wheezes Cardiovascular: Denies palpitation, chest discomfort or lower extremity swelling Gastrointestinal:  Denies nausea, heartburn or change in bowel habits Skin: Denies abnormal skin rashes MSK: (+) trigger finger of right thumb (+) chronic back pain Lymphatics: Denies new lymphadenopathy or easy bruising Neurological:Denies numbness, tingling or new weaknesses Behavioral/Psych: Mood is stable, no new changes  All  other systems were reviewed with the patient and are negative.  PHYSICAL EXAMINATION:  ECOG PERFORMANCE STATUS: 0 - Asymptomatic  Vitals:   02/14/18 1127  BP: (!) 131/92  Pulse: 66  Resp: 18  Temp: 98 F (36.7 C)  SpO2: 100%   Filed Weights   02/14/18 1127  Weight: 118 lb 6.4 oz (53.7 kg)    GENERAL:alert, no distress and comfortable SKIN: skin color, texture, turgor are normal, no rashes or significant lesions EYES: normal, conjunctiva are pink and non-injected, sclera clear OROPHARYNX:no exudate, no erythema and lips, buccal mucosa, and tongue normal  NECK: supple, thyroid normal size, non-tender, without nodularity LYMPH:  no palpable lymphadenopathy in the cervical, axillary or inguinal LUNGS: clear to auscultation and percussion with normal breathing effort HEART: regular rate & rhythm and no murmurs and no lower extremity edema ABDOMEN:abdomen soft, non-tender and normal bowel sounds Musculoskeletal:no cyanosis of digits and no clubbing  PSYCH: alert & oriented x 3 with fluent speech NEURO: no focal motor/sensory deficits BREAST: Diffuse skin rash on right breast without skin breakdown, ulcers, or discharge.   LABORATORY DATA:  I have reviewed the data as listed CBC Latest Ref Rng & Units 11/27/2017 06/27/2016 06/12/2016  WBC 3.9 - 10.3 K/uL 6.8 9.5 8.9  Hemoglobin 11.6 - 15.9 g/dL 12.7 10.6(L) 12.3  Hematocrit 34.8 - 46.6 % 37.3 30.6(L) 35.7(L)  Platelets 145 - 400 K/uL 330 249 283    CMP Latest Ref Rng & Units 11/27/2017 06/27/2016 06/12/2016  Glucose 70 - 140  mg/dL 75 112(H) 87  BUN 7 - 26 mg/dL 22 24(H) 25(H)  Creatinine 0.60 - 1.10 mg/dL 0.85 0.89 0.85  Sodium 136 - 145 mmol/L 139 137 134(L)  Potassium 3.5 - 5.1 mmol/L 3.8 3.6 3.7  Chloride 98 - 109 mmol/L 101 103 100(L)  CO2 22 - 29 mmol/L '28 28 28  ' Calcium 8.4 - 10.4 mg/dL 10.0 8.5(L) 9.2  Total Protein 6.4 - 8.3 g/dL 7.3 - -  Total Bilirubin 0.2 - 1.2 mg/dL 0.4 - -  Alkaline Phos 40 - 150 U/L 76 - -  AST 5  - 34 U/L 20 - -  ALT 0 - 55 U/L 20 - -    PATHOLOGY  Diagnosis, 12/25/2017 Breast, lumpectomy, Right w/seed - DUCTAL CARCINOMA IN SITU WITH CALCIFICATIONS, LOW GRADE, SPANNING 1.3 CM. - THE SURGICAL RESECTION MARGINS ARE NEGATIVE FOR CARCINOMA.   Diagnosis 11/19/17 Breast, right, needle core biopsy - DUCTAL CARCINOMA IN SITU WITH CALCIFICATIONS. - SEE COMMENT. Microscopic Comment The carcinoma appears intermediate grade. Estrogen receptor and progesterone receptor studies will be performed and the results reported separately The results were called to [facility] on 11/20/2017. (JBK:kh 11/20/17). PROGNOSTIC INDICATORS Results: IMMUNOHISTOCHEMICAL AND MORPHOMETRIC ANALYSIS PERFORMED MANUALLY Estrogen Receptor: 90%, POSITIVE, STRONG STAINING INTENSITY Progesterone Receptor: 25%, POSITIVE, STRONG STAINING INTENSITY   RADIOGRAPHIC STUDIES: I have personally reviewed the radiological images as listed and agreed with the findings in the report. No results found.   Diagnostic Mammogram Right breast 11/07/17 IMPRESSION:  The results of the exam were reviewed with the patient.  The 3 cm are of grouped branching linear calcifications in the right breast lower inner quadrant middle depth are at moderate concern but not classic for malignancy.  A Stereotactic biopsy is recommended.    Bone Density Scan 10/30/17  NORMAL Lowest site measured: Left Femoral Neck  T-Score: -0.4  ASSESSMENT & PLAN:  Melissa Mcclure is a 62 y.o. Caucasian female with a history of  GERD, H/o Melanoma of the left knee and right arm, Chronic back pain, heart murmur, HTN, and vertigo.   1. Ductal carcinoma in situ (DCIS) of right breast, ER/PR Positive, Grade 1 --I reviewed her surgical pathology findings with patient, which showed low-grade DCIS, ER PR positive.  Margins were clear. -Her DCIS is cured by complete surgical resection. Any form of adjuvant therapy is preventive.  --She has completed adjuvant  chemotherapy, which will reduce her risk of future risk of right breast cancer. -Giving the strong ER and PR expression in her postmenopausal status, I discussed the option of adjuvant endocrine therapy with Tamoxifen, Raloxifene or Anastrozole for a total of 5 years, which decrease her risk of future breast cancer by ~40%. I recommend her consider Raloxifen  ---The potential side effects, which includes but not limited to, hot flash, skin and vaginal dryness, slightly increased risk of cardiovascular disease and cataract, small risk of thrombosis and endometrial cancer, were discussed with her in great details. Preventive strategies for thrombosis, such as being physically active, using compression stocks, avoid cigarette smoking, etc., were reviewed with her. I also recommend her to follow-up with her gynecologist once a year, and watch for vaginal spotting or bleeding, as a clinically sign of endometrial cancer, etc. She voiced good understanding, and agrees to proceed.  --We also discussed the breast cancer surveillance after her surgery. She will continue annual screening mammogram, self exam, and a routine office visit with lab and exam with [place]. -I will prescribe her raloxifene today and she  will start mid-late June after 1 month on Effexor  -Lab and f/u in 3 months    2. Genetic Counseling  -Given her moderate family history of cancer she is eligible for genetic testing. She is agreeable. -Will send genetic referral today  -I previously explained environmental elements and health problems that can increase risk of breast cancer to pt and her family.   3. HTN, history of skin melanoma -Follow-up with PCP  4. Hot Flashes -She is on 100 mg gabapentin 2 tablets at night for her hot flashes and she was prescribed this by Dr. Janeann Merl. This medication is mildly working for her, but she would like a more effective medication if possible.  -I discussed effexor with patient today to aid with her  hot flashes. I informed the patient that if she chooses Effexor, then she will d/c use of gabapentin -Patient will start on 37.5 mg Effexor daily to aid with hot flashes. I will prescribe this today.   PLAN:  -Discontinue gabapentin, will prescribe 37.5 mg Effexor daily for hot flashes -Prescribe raloxifene to start in 1 month until hot flashes are controlled. -Lab and f/u in 3 months     No orders of the defined types were placed in this encounter.   All questions were answered. The patient knows to call the clinic with any problems, questions or concerns. I spent 15 minutes counseling the patient face to face. The total time spent in the appointment was 20 minutes and more than 50% was on counseling.     Truitt Merle, MD 02/14/2018 11:49 AM  This document serves as a record of services personally performed by Truitt Merle, MD. It was created on her behalf by Steva Colder, a trained medical scribe. The creation of this record is based on the scribe's personal observations and the provider's statements to them.   I have reviewed the above documentation for accuracy and completeness, and I agree with the above.

## 2018-02-14 ENCOUNTER — Telehealth: Payer: Self-pay | Admitting: Hematology

## 2018-02-14 ENCOUNTER — Encounter: Payer: Self-pay | Admitting: Hematology

## 2018-02-14 ENCOUNTER — Encounter: Payer: Self-pay | Admitting: Radiation Oncology

## 2018-02-14 ENCOUNTER — Inpatient Hospital Stay: Payer: BLUE CROSS/BLUE SHIELD | Attending: Hematology | Admitting: Hematology

## 2018-02-14 ENCOUNTER — Ambulatory Visit
Admission: RE | Admit: 2018-02-14 | Discharge: 2018-02-14 | Disposition: A | Discharge status: Home / Self Care | Payer: BLUE CROSS/BLUE SHIELD | Source: Ambulatory Visit | Attending: Radiation Oncology | Admitting: Radiation Oncology

## 2018-02-14 VITALS — BP 131/92 | HR 66 | Temp 98.0°F | Resp 18 | Ht 62.5 in | Wt 118.4 lb

## 2018-02-14 DIAGNOSIS — N951 Menopausal and female climacteric states: Secondary | ICD-10-CM | POA: Diagnosis not present

## 2018-02-14 DIAGNOSIS — Z170 Estrogen receptor positive status [ER+]: Secondary | ICD-10-CM

## 2018-02-14 DIAGNOSIS — Z79899 Other long term (current) drug therapy: Secondary | ICD-10-CM | POA: Diagnosis not present

## 2018-02-14 DIAGNOSIS — I10 Essential (primary) hypertension: Secondary | ICD-10-CM | POA: Diagnosis not present

## 2018-02-14 DIAGNOSIS — Z85820 Personal history of malignant melanoma of skin: Secondary | ICD-10-CM

## 2018-02-14 DIAGNOSIS — D0511 Intraductal carcinoma in situ of right breast: Secondary | ICD-10-CM | POA: Diagnosis not present

## 2018-02-14 DIAGNOSIS — Z510 Encounter for antineoplastic radiation therapy: Secondary | ICD-10-CM | POA: Diagnosis not present

## 2018-02-14 MED ORDER — VENLAFAXINE HCL ER 37.5 MG PO CP24: 37.5000 mg | ORAL_CAPSULE | Freq: Every day | ORAL | 2 refills | Status: DC

## 2018-02-14 MED ORDER — RALOXIFENE HCL 60 MG PO TABS: 60.0000 mg | ORAL_TABLET | Freq: Every day | ORAL | 2 refills | Status: DC

## 2018-02-14 NOTE — Telephone Encounter (Signed)
Appointments scheduled AVS/Calendar printed per 5/24 los °

## 2018-02-16 ENCOUNTER — Encounter: Payer: Self-pay | Admitting: Hematology

## 2018-02-17 ENCOUNTER — Ambulatory Visit: Payer: BLUE CROSS/BLUE SHIELD

## 2018-02-18 ENCOUNTER — Ambulatory Visit: Payer: BLUE CROSS/BLUE SHIELD

## 2018-02-19 ENCOUNTER — Ambulatory Visit: Payer: BLUE CROSS/BLUE SHIELD

## 2018-02-20 ENCOUNTER — Ambulatory Visit: Payer: BLUE CROSS/BLUE SHIELD

## 2018-02-21 ENCOUNTER — Ambulatory Visit: Payer: BLUE CROSS/BLUE SHIELD

## 2018-02-24 ENCOUNTER — Ambulatory Visit: Payer: BLUE CROSS/BLUE SHIELD

## 2018-02-24 NOTE — Progress Notes (Signed)
  Radiation Oncology         [phone] ________________________________  Name: Melissa Mcclure MRN: [id number]  Date: 02/14/2018  DOB: 1955-11-17  End of Treatment Note  Diagnosis:   62 y.o. female with Stage TisN0M0, Low grade, ER/PR positive DCIS of the right breast    Indication for treatment:  Curative       Radiation treatment dates:   01/20/2018 - 02/14/2018  Site/dose:   The patient initially received a dose of 42.5 Gy in 17 fractions to the right breast using whole-breast tangent fields. This was delivered using a 3-D conformal technique. The patient then received a boost to the seroma. This delivered an additional 7.5 Gy in 3 fractions using a 3 field photon technique due to the depth of the seroma. The total dose was 50 Gy.  Narrative: The patient tolerated radiation treatment relatively well.   The patient had some expected skin irritation as she progressed during treatment. Moist desquamation was not present at the end of treatment.  Plan: The patient has completed radiation treatment. The patient will return to radiation oncology clinic for routine followup in one month. I advised the patient to call or return sooner if they have any questions or concerns related to their recovery or treatment. ________________________________  Jodelle Gross, MD, PhD  This document serves as a record of services personally performed by Kyung Rudd, MD. It was created on his behalf by Rae Lips, a trained medical scribe. The creation of this record is based on the scribe's personal observations and the provider's statements to them. This document has been checked and approved by the attending provider.

## 2018-03-05 ENCOUNTER — Other Ambulatory Visit: Payer: Self-pay

## 2018-03-05 ENCOUNTER — Emergency Department (HOSPITAL_BASED_OUTPATIENT_CLINIC_OR_DEPARTMENT_OTHER)
Admission: EM | Admit: 2018-03-05 | Discharge: 2018-03-05 | Disposition: A | Discharge status: Home / Self Care | Payer: BLUE CROSS/BLUE SHIELD | Attending: Emergency Medicine | Admitting: Emergency Medicine

## 2018-03-05 ENCOUNTER — Encounter (HOSPITAL_BASED_OUTPATIENT_CLINIC_OR_DEPARTMENT_OTHER): Payer: Self-pay

## 2018-03-05 DIAGNOSIS — L299 Pruritus, unspecified: Secondary | ICD-10-CM | POA: Diagnosis not present

## 2018-03-05 DIAGNOSIS — W57XXXA Bitten or stung by nonvenomous insect and other nonvenomous arthropods, initial encounter: Secondary | ICD-10-CM | POA: Insufficient documentation

## 2018-03-05 DIAGNOSIS — S30860D Insect bite (nonvenomous) of lower back and pelvis, subsequent encounter: Secondary | ICD-10-CM | POA: Diagnosis not present

## 2018-03-05 DIAGNOSIS — Y939 Activity, unspecified: Secondary | ICD-10-CM | POA: Insufficient documentation

## 2018-03-05 DIAGNOSIS — Y92019 Unspecified place in single-family (private) house as the place of occurrence of the external cause: Secondary | ICD-10-CM | POA: Insufficient documentation

## 2018-03-05 DIAGNOSIS — S20461A Insect bite (nonvenomous) of right back wall of thorax, initial encounter: Secondary | ICD-10-CM | POA: Insufficient documentation

## 2018-03-05 DIAGNOSIS — Z79899 Other long term (current) drug therapy: Secondary | ICD-10-CM | POA: Insufficient documentation

## 2018-03-05 DIAGNOSIS — I10 Essential (primary) hypertension: Secondary | ICD-10-CM | POA: Diagnosis not present

## 2018-03-05 DIAGNOSIS — Y999 Unspecified external cause status: Secondary | ICD-10-CM | POA: Insufficient documentation

## 2018-03-05 MED ORDER — DOXYCYCLINE HYCLATE 100 MG PO CAPS: 100.0000 mg | ORAL_CAPSULE | Freq: Two times a day (BID) | ORAL | 0 refills | Status: AC

## 2018-03-05 MED ORDER — DOXYCYCLINE HYCLATE 100 MG PO TABS
100.0000 mg | ORAL_TABLET | Freq: Once | ORAL | Status: AC
  Administered 2018-03-05: 100 mg via ORAL
  Filled 2018-03-05: qty 1

## 2018-03-05 NOTE — ED Provider Notes (Signed)
[facility]PARTMENT Provider Note   CSN: [id number] Arrival date & time: 03/05/18  2118     History   Chief Complaint Chief Complaint  Patient presents with  . Tick Removal    HPI Melissa Mcclure is a 62 y.o. female with a past medical history of breast cancer, pretension, who presents to ED for evaluation of tick bite to the right side of her back.  She has been itching at the area since May.  States [place] ticks.  She noticed today that there is a tick embedded into her back.  She has not tried removing it.  She denies any other symptoms.  She is scheduled for a trigger finger surgery tomorrow morning.  HPI  Past Medical History:  Diagnosis Date  . Acid reflux    GERD  . Cancer ([place]) 08,11   melanoma-left knee, right arm  . Chronic back pain   . Complication of anesthesia   . Ductal carcinoma in situ (DCIS) of right breast 10/2017  . Family history of adverse reaction to anesthesia    mother has PONV  . Family history of colon cancer   . Family history of melanoma   . Family history of ovarian cancer   . Genetic testing 12/23/2017   STAT Breast panel with reflex to Multi-Cancer panel (83 genes) @ [facility] - No pathogenic mutations detected  . GERD (gastroesophageal reflux disease)   . H/O hiatal hernia   . Heart murmur    no problems  . Hypertension   . PONV (postoperative nausea and vomiting)    "zofran works great  . Seasonal allergies   . SVD (spontaneous vaginal delivery)    x 1  . Vertigo     Patient Active Problem List   Diagnosis Date Noted  . Genetic testing 12/23/2017  . Family history of ovarian cancer   . Family history of colon cancer   . Family history of melanoma   . Ductal carcinoma in situ (DCIS) of right breast 11/22/2017  . Hiatal hernia 12/30/2015  . Malignant melanoma of lower leg ([place]) 03/10/2015  . Melanoma of right upper arm ([place]) 10/12/2013  . GERD  (gastroesophageal reflux disease) 10/12/2013  . Essential hypertension, benign 10/12/2013    Past Surgical History:  Procedure Laterality Date  . ANTERIOR LAT LUMBAR FUSION Right 04/17/2013   Procedure: ANTERIOR LATERAL LUMBAR FUSION 3 LEVELS;  Surgeon: Erline Levine, MD;  Location: [facility];  Service: Neurosurgery;  Laterality: Right;  Right Lumbar Two-Three Lumbar Three-Four Lumbar Four-Five  Anterolateral Fusion  . BREAST LUMPECTOMY WITH RADIOACTIVE SEED LOCALIZATION Right 12/25/2017   Procedure: RIGHT BREAST LUMPECTOMY WITH RADIOACTIVE SEED LOCALIZATION;  Surgeon: Fanny Skates, MD;  Location: [facility];  Service: General;  Laterality: Right;  . [place]  . CESAREAN SECTION  1980  . COLONOSCOPY    . CYSTOSCOPY N/A 06/26/2016   Procedure: CYSTOSCOPY;  Surgeon: Megan Salon, MD;  Location: [facility];  Service: Gynecology;  Laterality: N/A;  . DILATATION & CURETTAGE/HYSTEROSCOPY WITH MYOSURE N/A 01/24/2016   Procedure: DILATATION & CURETTAGE/HYSTEROSCOPY WITH MYOSURE;  Surgeon: Megan Salon, MD;  Location: [facility];  Service: Gynecology;  Laterality: N/A;  . FOOT SURGERY Right    early 20s-bone spur removal  . LAPAROSCOPIC HYSTERECTOMY Bilateral 06/26/2016   Procedure: HYSTERECTOMY TOTAL LAPAROSCOPIC;  Surgeon: Megan Salon, MD;  Location: [facility];  Service: Gynecology;  Laterality: Bilateral;  . LUMBAR PERCUTANEOUS PEDICLE SCREW 3 LEVEL N/A 04/17/2013   Procedure: LUMBAR PERCUTANEOUS PEDICLE SCREW 3 LEVEL;  Surgeon: Erline Levine, MD;  Location: [facility];  Service: Neurosurgery;  Laterality: N/A;  Decompression and Posterior Lumbar Interbody Fusion of Lumbar Five-Sacral One, Percutaneous Screws Lumbar Two through Sacral One  . MELANOMA EXCISION     knee 08 lft. elbow 11rt  . SALPINGOOPHORECTOMY Bilateral 06/26/2016   Procedure: SALPINGO OOPHORECTOMY;  Surgeon: Megan Salon, MD;  Location: [facility];  Service: Gynecology;  Laterality: Bilateral;  . SHOULDER SURGERY  Right 04/28/2015  . TUBAL LIGATION  1986  . WLE  06/17/07   and left groin lymph node     OB History    Gravida  3   Para  3   Term      Preterm      AB      Living  3     SAB      TAB      Ectopic      Multiple      Live Births               Home Medications    Prior to Admission medications   Medication Sig Start Date End Date Taking? Authorizing Provider  acetaminophen (TYLENOL) 500 MG tablet Take 1,000 mg by mouth every 6 (six) hours as needed for mild pain. Reported on 01/23/2016    [provider]  amitriptyline (ELAVIL) 50 MG tablet Take 1 tablet by mouth at bedtime. Reported on 01/23/2016 11/27/15   [provider]  Ascorbic Acid (VITAMIN C WITH ROSE HIPS) 1000 MG tablet Take 1,000 mg by mouth daily. Reported on 01/23/2016    [provider]  Calcium 500-100 MG-UNIT CHEW Chew by mouth daily.    [provider]  cetirizine (ZYRTEC) 10 MG tablet Take 10 mg by mouth daily. Reported on 01/23/2016    [provider]  doxycycline (VIBRAMYCIN) 100 MG capsule Take 1 capsule (100 mg total) by mouth 2 (two) times daily for 7 days. 03/05/18 03/12/18  Mckenna Gamm, PA-C  famotidine (PEPCID) 10 MG tablet Take 10 mg by mouth daily as needed for heartburn. Reported on 01/23/2016    [provider]  FIBER COMPLETE PO Take by mouth daily.    [provider]  gabapentin (NEURONTIN) 100 MG capsule Take 1 capsule (100 mg total) by mouth at bedtime. Can increase to two capsules at night. Patient taking differently: Take 200 mg by mouth at bedtime. Can increase to two capsules at night. 11/21/17   Megan Salon, MD  HYDROcodone-acetaminophen [facility]) 5-325 MG tablet Take 1-2 tablets by mouth every 6 (six) hours as needed for moderate pain or severe pain. 12/25/17   Fanny Skates, MD  ibuprofen (ADVIL,MOTRIN) 200 MG tablet Take 4 tablets (800 mg total) by mouth every 6 (six) hours as needed for moderate pain. Reported on 01/23/2016  06/27/16   Megan Salon, MD  Lactobacillus Rhamnosus, GG, (CULTURELLE PO) Take 1 capsule by mouth daily.    [provider]  losartan-hydrochlorothiazide (HYZAAR) 100-12.5 MG per tablet Take 1 tablet by mouth daily. Reported on 01/23/2016    [provider]  meclizine (ANTIVERT) 25 MG tablet Take 25 mg by mouth 3 (three) times daily as needed for dizziness. Reported on 01/23/2016    [provider]  Multiple Vitamin (MULTIVITAMIN WITH MINERALS) TABS tablet Take 1 tablet by mouth daily. Reported on 01/23/2016  [provider]  ondansetron (ZOFRAN ODT) 4 MG disintegrating tablet 4mg  ODT q4 hours prn nausea/vomit 04/28/15   Hess, Robyn M, PA-C  pantoprazole (PROTONIX) 40 MG tablet Take 40 mg by mouth daily. Reported on 01/23/2016    [provider]  polyethylene glycol (MIRALAX / GLYCOLAX) packet Take 8.5 g by mouth daily as needed for mild constipation. Reported on 01/23/2016    [provider]  raloxifene (EVISTA) 60 MG tablet Take 1 tablet (60 mg total) by mouth daily. 02/14/18   Truitt Merle, MD  tizanidine (ZANAFLEX) 2 MG capsule Take 4 mg by mouth at bedtime as needed for muscle spasms. Reported on 01/23/2016    [provider]  traMADol (ULTRAM) 50 MG tablet Take 1 tablet by mouth as needed. 12/28/16   [provider]  venlafaxine XR (EFFEXOR-XR) 37.5 MG 24 hr capsule Take 1 capsule (37.5 mg total) by mouth daily with breakfast. 02/14/18   Truitt Merle, MD    Family History Family History  Problem Relation Age of Onset  . Diabetes Father        borderline  . Melanoma Father        dx 47s/60s; deceased 36s  . Congestive Heart Failure Father   . Colon cancer Mother        dx 37s; deceased 63  . Hypertension Mother   . Osteoporosis Mother   . Ovarian cancer Maternal Grandmother        deceased 76s  . Heart disease Paternal Grandfather   . Melanoma Daughter        on leg; dx 72s    Social History Social History   Tobacco Use  .  Smoking status: Never Smoker  . Smokeless tobacco: Never Used  Substance Use Topics  . Alcohol use: Yes    Alcohol/week: 0.0 oz    Comment: occ  . Drug use: No     Allergies   Sulfur   Review of Systems Review of Systems  Constitutional: Negative for chills and fever.  Musculoskeletal: Negative for myalgias, neck pain and neck stiffness.  Skin: Negative for rash.       +tick  Neurological: Negative for headaches.     Physical Exam Updated Vital Signs BP (!) 152/99 (BP Location: Left Arm)   Pulse 79   Temp 97.8 F (36.6 C) (Oral)   Resp 16   LMP 06/24/2008   SpO2 100%   Physical Exam  Constitutional: She appears well-developed and well-nourished. No distress.  HENT:  Head: Normocephalic and atraumatic.  Eyes: Conjunctivae and EOM are normal. No scleral icterus.  Neck: Normal range of motion.  Pulmonary/Chest: Effort normal. No respiratory distress.  Neurological: She is alert.  Skin: No rash noted. She is not diaphoretic.  Deer tick embedded on right mid back.  Psychiatric: She has a normal mood and affect.  Nursing note and vitals reviewed.    ED Treatments / Results  Labs (all labs ordered are listed, but only abnormal results are displayed) Labs Reviewed - No data to display  EKG None  Radiology No results found.  Procedures Procedures (including critical care time)  Medications Ordered in ED Medications  doxycycline (VIBRA-TABS) tablet 100 mg (has no administration in time range)     Initial Impression / Assessment and Plan / ED Course  I have reviewed the triage vital signs and the nursing notes.  Pertinent labs & imaging results that were available during my care of the patient were reviewed by me and considered  in my medical decision making (see chart for details).     Patient presents to ED for evaluation of tick embedded in the right side of her back.  She noticed it today.  However, she has been itching at the site for the past  month.  She denies any myalgias, fever, headache or URI symptoms.  Embedded tick was visualized and removed.  She will be placed on doxycycline prophylactically for [facility] spotted fever.  Advised to follow-up with her primary care provider and to return to ED for any severe worsening symptoms.  Portions of this note were generated with [employer]. Dictation errors may occur despite best attempts at proofreading.   Final Clinical Impressions(s) / ED Diagnoses   Final diagnoses:  Tick bite with subsequent removal of tick    ED Discharge Orders        Ordered    doxycycline (VIBRAMYCIN) 100 MG capsule  2 times daily     03/05/18 2203       Delia Heady, PA-C 03/05/18 2205    Margette Fast, MD 03/06/18 1345

## 2018-03-05 NOTE — ED Triage Notes (Signed)
Pt states she has a tick on her back-first noticed today-states area has been itching x weeks-NAD-steady gait

## 2018-03-05 NOTE — Discharge Instructions (Signed)
Please complete the entire course of your doxycycline to prevent worsening or recurrence of infection.

## 2018-03-06 DIAGNOSIS — M65311 Trigger thumb, right thumb: Secondary | ICD-10-CM | POA: Diagnosis not present

## 2018-03-06 DIAGNOSIS — T148XXA Other injury of unspecified body region, initial encounter: Secondary | ICD-10-CM | POA: Diagnosis not present

## 2018-03-17 ENCOUNTER — Ambulatory Visit
Admission: RE | Admit: 2018-03-17 | Discharge: 2018-03-17 | Disposition: A | Discharge status: Home / Self Care | Payer: BLUE CROSS/BLUE SHIELD | Source: Ambulatory Visit | Attending: Radiation Oncology | Admitting: Radiation Oncology

## 2018-03-17 ENCOUNTER — Encounter: Payer: Self-pay | Admitting: Radiation Oncology

## 2018-03-17 ENCOUNTER — Other Ambulatory Visit: Payer: Self-pay

## 2018-03-17 VITALS — BP 139/88 | HR 67 | Temp 98.0°F | Resp 18 | Ht 62.5 in | Wt 121.0 lb

## 2018-03-17 DIAGNOSIS — Z170 Estrogen receptor positive status [ER+]: Secondary | ICD-10-CM | POA: Diagnosis not present

## 2018-03-17 DIAGNOSIS — D0511 Intraductal carcinoma in situ of right breast: Secondary | ICD-10-CM | POA: Insufficient documentation

## 2018-03-17 DIAGNOSIS — Z923 Personal history of irradiation: Secondary | ICD-10-CM | POA: Diagnosis not present

## 2018-03-17 DIAGNOSIS — K5900 Constipation, unspecified: Secondary | ICD-10-CM | POA: Diagnosis not present

## 2018-03-17 DIAGNOSIS — Z79899 Other long term (current) drug therapy: Secondary | ICD-10-CM | POA: Diagnosis not present

## 2018-03-17 NOTE — Progress Notes (Signed)
Radiation Oncology         [phone]) [phone] ________________________________  Name: Melissa Mcclure MRN: [id number]  Date of Service: 03/17/2018  DOB: Aug 01, 1956  Post Treatment Note  CC: Maury Dus, MD  Fanny Skates, MD  Diagnosis:   Low grade, ER/PR positive DCIS of the right breast.  Interval Since Last Radiation:  5 weeks   01/20/2018 - 02/14/2018: The patient initially received a dose of 42.5 Gy in 17 fractions to the right breast using whole-breast tangent fields. This was delivered using a 3-D conformal technique. The patient then received a boost to the seroma. This delivered an additional 7.5 Gy in 3 fractions using a 3 field photon technique due to the depth of the seroma. The total dose was 50 Gy.  Narrative:  The patient returns today for routine follow-up. During treatment she did very well with radiotherapy and did not have significant desquamation.               On review of systems, the patient states she is doing well. She recently had a trigger finger released and is healing well from this. No other complaints are verbalized.   ALLERGIES:  is allergic to sulfur.  Meds: Current Outpatient Medications  Medication Sig Dispense Refill  . acetaminophen (TYLENOL) 500 MG tablet Take 1,000 mg by mouth every 6 (six) hours as needed for mild pain. Reported on 01/23/2016    . Ascorbic Acid (VITAMIN C WITH ROSE HIPS) 1000 MG tablet Take 1,000 mg by mouth daily. Reported on 01/23/2016    . Calcium 500-100 MG-UNIT CHEW Chew by mouth daily.    . cetirizine (ZYRTEC) 10 MG tablet Take 10 mg by mouth daily. Reported on 01/23/2016    . doxycycline (VIBRAMYCIN) 100 MG capsule Take 1 capsule (100 mg total) by mouth 2 (two) times daily for 7 days. 14 capsule 0  . FIBER COMPLETE PO Take by mouth daily.    Marland Kitchen ibuprofen (ADVIL,MOTRIN) 200 MG tablet Take 4 tablets (800 mg total) by mouth every 6 (six) hours as needed for moderate pain. Reported on 01/23/2016 30 tablet 0  . Lactobacillus Rhamnosus,  GG, (CULTURELLE PO) Take 1 capsule by mouth daily.    Marland Kitchen losartan-hydrochlorothiazide (HYZAAR) 100-12.5 MG per tablet Take 1 tablet by mouth daily. Reported on 01/23/2016    . Multiple Vitamin (MULTIVITAMIN WITH MINERALS) TABS tablet Take 1 tablet by mouth daily. Reported on 01/23/2016    . pantoprazole (PROTONIX) 40 MG tablet Take 40 mg by mouth daily. Reported on 01/23/2016    . polyethylene glycol (MIRALAX / GLYCOLAX) packet Take 8.5 g by mouth daily as needed for mild constipation. Reported on 01/23/2016    . tizanidine (ZANAFLEX) 2 MG capsule Take 4 mg by mouth at bedtime as needed for muscle spasms. Reported on 01/23/2016    . traMADol (ULTRAM) 50 MG tablet Take 1 tablet by mouth as needed.    . venlafaxine XR (EFFEXOR-XR) 37.5 MG 24 hr capsule Take 1 capsule (37.5 mg total) by mouth daily with breakfast. 30 capsule 2  . famotidine (PEPCID) 10 MG tablet Take 10 mg by mouth daily as needed for heartburn. Reported on 01/23/2016    . HYDROcodone-acetaminophen (NORCO) 5-325 MG tablet Take 1-2 tablets by mouth every 6 (six) hours as needed for moderate pain or severe pain. (Patient not taking: Reported on 03/17/2018) 20 tablet 0  . meclizine (ANTIVERT) 25 MG tablet Take 25 mg by mouth 3 (three) times daily as needed for dizziness. Reported on  01/23/2016    . ondansetron (ZOFRAN ODT) 4 MG disintegrating tablet 4mg  ODT q4 hours prn nausea/vomit (Patient not taking: Reported on 03/17/2018) 10 tablet 0  . raloxifene (EVISTA) 60 MG tablet Take 1 tablet (60 mg total) by mouth daily. (Patient not taking: Reported on 03/17/2018) 30 tablet 2   No current facility-administered medications for this encounter.     Physical Findings:  height is 5' 2.5" (1.588 m) and weight is 121 lb (54.9 kg). Her oral temperature is 98 F (36.7 C). Her blood pressure is 139/88 and her pulse is 67. Her respiration is 18 and oxygen saturation is 100%.  Pain Assessment Pain Score: 0-No pain/10 In general this is a well appearing caucasian  female in no acute distress. She's alert and oriented x4 and appropriate throughout the examination. Cardiopulmonary assessment is negative for acute distress and she exhibits normal effort. The right breast was examined and reveals mild hyperpigmentation.   Lab Findings: Lab Results  Component Value Date   WBC 6.8 11/27/2017   HGB 12.7 11/27/2017   HCT 37.3 11/27/2017   MCV 92.9 11/27/2017   PLT 330 11/27/2017     Radiographic Findings: No results found.  Impression/Plan: 1. Low grade, ER/PR positive DCIS of the right breast. The patient has been doing well since completion of radiotherapy. We discussed that we would be happy to continue to follow her as needed, but she will also continue to follow up with Dr. Burr Medico in medical oncology. She was counseled on skin care as well as measures to avoid sun exposure to this area.  2. Survivorship. We discussed the importance of survivorship evaluation and she is not yet currently scheduled for this, but will be in the near future. She was also given the monthly calendar for access to resources offered within the cancer center.     Carola Rhine, PAC

## 2018-03-18 DIAGNOSIS — M25641 Stiffness of right hand, not elsewhere classified: Secondary | ICD-10-CM | POA: Diagnosis not present

## 2018-03-24 DIAGNOSIS — M25641 Stiffness of right hand, not elsewhere classified: Secondary | ICD-10-CM | POA: Diagnosis not present

## 2018-04-09 DIAGNOSIS — M65311 Trigger thumb, right thumb: Secondary | ICD-10-CM | POA: Diagnosis not present

## 2018-04-09 DIAGNOSIS — M1811 Unilateral primary osteoarthritis of first carpometacarpal joint, right hand: Secondary | ICD-10-CM | POA: Diagnosis not present

## 2018-04-09 DIAGNOSIS — M65332 Trigger finger, left middle finger: Secondary | ICD-10-CM | POA: Diagnosis not present

## 2018-04-17 ENCOUNTER — Telehealth: Payer: Self-pay | Admitting: *Deleted

## 2018-04-17 MED ORDER — VENLAFAXINE HCL ER 37.5 MG PO CP24: 75.0000 mg | ORAL_CAPSULE | Freq: Every day | ORAL | 0 refills | Status: DC

## 2018-04-17 NOTE — Telephone Encounter (Signed)
-----   Message from Alla Feeling, NP sent at 04/17/2018 12:48 PM EDT ----- Contact: [phone] If she is tolerating effexor well otherwise, she can increase to 75 mg daily (2 tabs once per day) to see if that helps.  Thanks, Lacie  ----- Message ----- From: Wilmon Arms, RN Sent: 04/17/2018  10:14 AM To: Alla Feeling, NP, Wilmon Arms, RN  Patient called today.  Saw Dr Burr Medico back in May.  She changed her medications.  Suffering hot flashes.  Stopped Gabapentin and started Raloxifene and Effexor 37.5 mg.    She called today to advise that the hot flashes aren't getting any better and needed to know if she should have any medications adjusted or increased to help manage.    Please advise so I can notify the patient.  Thanks,  Julianne Rice, RN

## 2018-04-17 NOTE — Telephone Encounter (Signed)
Patient called today.  Saw Dr Burr Medico back in May.  She changed her medications.  Suffering hot flashes.  Stopped Gabapentin and started Raloxifene and Effexor 37.5 mg.    She called today to advise that the hot flashes aren't getting any better and needed to know if she should have any medications adjusted or increased to help manage.    Message for advice sent to Cira Rue, NP Pending response.

## 2018-04-17 NOTE — Telephone Encounter (Signed)
Notified patient of increase to 75 mg of Effexor and advised to call in 2 weeks if she has not noticed any improvement.

## 2018-04-18 ENCOUNTER — Telehealth: Payer: Self-pay

## 2018-04-18 NOTE — Telephone Encounter (Signed)
Left voice message for patient to increase her Effexor 37.5 mg to 2 tablets daily to see if this helps with her hot flashes.  Encouraged her to call back if she has questions.

## 2018-04-18 NOTE — Telephone Encounter (Signed)
-----   Message from Truitt Merle, MD sent at 04/17/2018  6:59 PM EDT ----- Contact: [phone] I agree with Regan Rakers, thanks   ----- Message ----- From: Alla Feeling, NP Sent: 04/17/2018  12:48 PM To: Wilmon Arms, RN, Truitt Merle, MD  If she is tolerating effexor well otherwise, she can increase to 75 mg daily (2 tabs once per day) to see if that helps.  Thanks, Lacie  ----- Message ----- From: Wilmon Arms, RN Sent: 04/17/2018  10:14 AM To: Alla Feeling, NP, Wilmon Arms, RN  Patient called today.  Saw Dr Burr Medico back in May.  She changed her medications.  Suffering hot flashes.  Stopped Gabapentin and started Raloxifene and Effexor 37.5 mg.    She called today to advise that the hot flashes aren't getting any better and needed to know if she should have any medications adjusted or increased to help manage.    Please advise so I can notify the patient.  Thanks,  Julianne Rice, RN

## 2018-04-19 ENCOUNTER — Other Ambulatory Visit: Payer: Self-pay | Admitting: Gastroenterology

## 2018-04-21 DIAGNOSIS — M419 Scoliosis, unspecified: Secondary | ICD-10-CM | POA: Diagnosis not present

## 2018-04-21 DIAGNOSIS — M5415 Radiculopathy, thoracolumbar region: Secondary | ICD-10-CM | POA: Diagnosis not present

## 2018-04-21 DIAGNOSIS — M4316 Spondylolisthesis, lumbar region: Secondary | ICD-10-CM | POA: Diagnosis not present

## 2018-04-21 DIAGNOSIS — M545 Low back pain: Secondary | ICD-10-CM | POA: Diagnosis not present

## 2018-04-21 DIAGNOSIS — M47817 Spondylosis without myelopathy or radiculopathy, lumbosacral region: Secondary | ICD-10-CM | POA: Diagnosis not present

## 2018-04-29 ENCOUNTER — Other Ambulatory Visit: Payer: Self-pay | Admitting: Hematology

## 2018-04-29 ENCOUNTER — Other Ambulatory Visit: Payer: Self-pay | Admitting: Nurse Practitioner

## 2018-05-12 ENCOUNTER — Other Ambulatory Visit: Payer: Self-pay | Admitting: *Deleted

## 2018-05-12 ENCOUNTER — Telehealth: Payer: Self-pay | Admitting: *Deleted

## 2018-05-12 DIAGNOSIS — D0511 Intraductal carcinoma in situ of right breast: Secondary | ICD-10-CM

## 2018-05-12 MED ORDER — VENLAFAXINE HCL ER 37.5 MG PO CP24: 75.0000 mg | ORAL_CAPSULE | Freq: Every day | ORAL | 1 refills | Status: DC

## 2018-05-12 NOTE — Telephone Encounter (Signed)
Pt called requesting refill of Effexor.  Stated has only 2 tabs left, and won't have enough until Friday when pt has appt with Dr. Burr Medico.  Spoke with pt and was informed that pt takes Effexor 37.5 mg  BID with relief of hot flashes. Refill sent in to pt's pharmacy as per request.

## 2018-05-15 NOTE — Progress Notes (Signed)
Melissa Mcclure  Telephone:([phone]) [phone] Fax:(336) [phone]  Clinic Follow Up Note   Patient Care Team: Maury Dus, MD as PCP - General (Family Medicine)   Date of Service:  05/16/2018  CHIEF COMPLAINTS/PURPOSE OF CONSULTATION:  F/u for Ductal carcinoma in situ (DCIS) of right breast  Oncology History   Cancer Staging Ductal carcinoma in situ (DCIS) of right breast Staging form: Breast, AJCC 8th Edition - Clinical stage from 11/19/2017: Stage 0 (cTis (DCIS), cN0, cM0, ER: Positive, PR: Positive, HER2: Not assessed ) - Signed by Truitt Merle, MD on 11/26/2017       Ductal carcinoma in situ (DCIS) of right breast   11/07/2017 Mammogram     Diagnostic Mammogram Right breast 11/07/17 IMPRESSION:  The results of the exam were reviewed with the patient.  The 3 cm are of grouped branching linear calcifications in the right breast lower inner quadrant middle depth are at moderate concern but not classic for malignancy.  A Stereotactic biopsy is recommended.    11/19/2017 Initial Biopsy    Diagnosis 11/19/17 Breast, right, needle core biopsy - DUCTAL CARCINOMA IN SITU WITH CALCIFICATIONS. - SEE COMMENT.     11/19/2017 Receptors her2    Estrogen Receptor: 90%, POSITIVE, STRONG STAINING INTENSITY Progesterone Receptor: 25%, POSITIVE, STRONG STAINING INTENSITY     11/19/2017 Initial Diagnosis    Ductal carcinoma in situ (DCIS) of right breast    12/23/2017 Genetic Testing    Testing did not reveal a pathogenic mutation in any of the genes analyzed.A copy of the genetic test report will be scanned into Epic under the Media tab.  The genes analyzed were the 83 genes on [facility]'s Multi-Cancer panel (ALK, APC, ATM, AXIN2, BAP1, BARD1, BLM, BMPR1A, BRCA1, BRCA2, BRIP1, CASR, CDC73, CDH1, CDK4, CDKN1B, CDKN1C, CDKN2A, CEBPA, CHEK2, CTNNA1, DICER1, DIS3L2, EGFR, EPCAM, FH, FLCN, GATA2, GPC3, GREM1, HOXB13, HRAS, KIT, MAX, MEN1, MET, MITF, MLH1, MSH2, MSH3, MSH6, MUTYH, NBN, NF1,  NF2, NTHL1, PALB2, PDGFRA, PHOX2B, PMS2, POLD1, POLE, POT1, PRKAR1A, PTCH1, PTEN, RAD50, RAD51C, RAD51D, RB1, RECQL4, RET, RUNX1, SDHA, SDHAF2, SDHB, SDHC, SDHD, SMAD4, SMARCA4, SMARCB1, SMARCE1, STK11, SUFU, TERC, TERT, TMEM127, TP53, TSC1, TSC2, VHL, WRN, WT1).    12/25/2017 Pathology Results    Diagnosis, 12/25/2017 Breast, lumpectomy, Right w/seed - DUCTAL CARCINOMA IN SITU WITH CALCIFICATIONS, LOW GRADE, SPANNING 1.3 CM. - THE SURGICAL RESECTION MARGINS ARE NEGATIVE FOR CARCINOMA    01/20/2018 - 02/14/2018 Radiation Therapy    Radiation treatment dates:   01/20/2018 - 02/14/2018  Site/dose:   The patient initially received a dose of 42.5 Gy in 17 fractions to the right breast using whole-breast tangent fields. This was delivered using a 3-D conformal technique. The patient then received a boost to the seroma. This delivered an additional 7.5 Gy in 3 fractions using a 3 field photon technique due to the depth of the seroma. The total dose was 50 Gy.  Narrative: The patient tolerated radiation treatment relatively well.   The patient had some expected skin irritation as she progressed during treatment. Moist desquamation was not present at the end of treatment.     02/2018 -  Anti-estrogen oral therapy    Raloxifene 68m once daily starting in 02/2018      HISTORY OF PRESENTING ILLNESS: 11/27/17 Melissa Pontiff62y.o. female is a here because of newly diagnosed right breast cancer. The patient presents to breast clinic today accompanied by her husband and daughter.  Her mass was found by screening mammogram which she gets  yearly. She had only one abnormal mammogram due to dense tissue breast. She notes her biopsy went well and has some bruising at site.    Today the patient notes she has trigger finger in her right thumb and pain in her back from previous surgery. She is overall able to be active and be independent.  She lives with her husband and has 3 children.    In the past the patient was  diagnosed with hiatal hernia, acid reflux and heart murmur. She also has an abdominal hernia. She does not plan to do repair soon. She has HTN which is well controlled on medication. She has had orthopedic surgeries on her back and shoulder. Her maternal grandmother had ovarian cancer, her mother had colon cancer.  She had hysterectomy after menopause started due to continued bleeding. She started menopause in her late 66s and started hormonal replacements due to significant symptoms. She was on a estrogen mist then progressed to the Estradial patch. She reduced dose of patch after hysterectomy.      GYN HISTORY  Menarchal: 12-13 LMP: Late 67s Contraceptive: HRT: Yes, Estradial patch 0.0382m  ended 11/20/17 G3P3: first child at 89yo  CURRENT THERAPY  Raloxifene 656monce daily starting in 02/2018  INTERVAL HISTORY:  Melissa MOLENDAresents to the office for follow up for right breast DCIS. She presents to the office by herself today. She notes she is doing fine. She notes her hot flashes are mostly tolerable with a few rough days. She has been taking 2 Effexor a day.  She has been taking Raloxifene since 5 and she notes no significant issues from this. She notes Raloxifene did increase her hot flashes. She denies weight gain or mood swings.  She still has chronic back pain for which she uses Tramadol for as needed. She notes she did have a  Fall on her recent [place] and took tramadol for that.  She notes soreness of breast post surgery and radiation.  She notes her niece was found to have colon cancer gene, she does nor remember the name of mutation.      MEDICAL HISTORY:  Past Medical History:  Diagnosis Date  . Acid reflux    GERD  . Cancer (HCGem08,11   melanoma-left knee, right arm  . Chronic back pain   . Complication of anesthesia   . Ductal carcinoma in situ (DCIS) of right breast 10/2017  . Family history of adverse reaction to anesthesia    mother has PONV  . Family  history of colon cancer   . Family history of melanoma   . Family history of ovarian cancer   . Genetic testing 12/23/2017   STAT Breast panel with reflex to Multi-Cancer panel (83 genes) @ [facility] - No pathogenic mutations detected  . GERD (gastroesophageal reflux disease)   . H/O hiatal hernia   . Heart murmur    no problems  . Hypertension   . PONV (postoperative nausea and vomiting)    "zofran works great  . Seasonal allergies   . SVD (spontaneous vaginal delivery)    x 1  . Vertigo     SURGICAL HISTORY: Past Surgical History:  Procedure Laterality Date  . ANTERIOR LAT LUMBAR FUSION Right 04/17/2013   Procedure: ANTERIOR LATERAL LUMBAR FUSION 3 LEVELS;  Surgeon: JoErline LevineMD;  [facility];  Service: Neurosurgery;  Laterality: Right;  Right Lumbar Two-Three Lumbar Three-Four Lumbar Four-Five  Anterolateral Fusion  . BREAST LUMPECTOMY WITH RADIOACTIVE  SEED LOCALIZATION Right 12/25/2017   Procedure: RIGHT BREAST LUMPECTOMY WITH RADIOACTIVE SEED LOCALIZATION;  Surgeon: Fanny Skates, MD;  Location: [facility];  Service: General;  Laterality: Right;  . [place]  . CESAREAN SECTION  1980  . COLONOSCOPY    . CYSTOSCOPY N/A 06/26/2016   Procedure: CYSTOSCOPY;  Surgeon: Megan Salon, MD;  Location: [facility];  Service: Gynecology;  Laterality: N/A;  . DILATATION & CURETTAGE/HYSTEROSCOPY WITH MYOSURE N/A 01/24/2016   Procedure: DILATATION & CURETTAGE/HYSTEROSCOPY WITH MYOSURE;  Surgeon: Megan Salon, MD;  Location: [facility];  Service: Gynecology;  Laterality: N/A;  . FOOT SURGERY Right    early 20s-bone spur removal  . LAPAROSCOPIC HYSTERECTOMY Bilateral 06/26/2016   Procedure: HYSTERECTOMY TOTAL LAPAROSCOPIC;  Surgeon: Megan Salon, MD;  Location: [facility];  Service: Gynecology;  Laterality: Bilateral;  . LUMBAR PERCUTANEOUS PEDICLE SCREW 3 LEVEL N/A 04/17/2013   Procedure: LUMBAR PERCUTANEOUS PEDICLE SCREW 3 LEVEL;  Surgeon: Erline Levine, MD;   Location: [facility];  Service: Neurosurgery;  Laterality: N/A;  Decompression and Posterior Lumbar Interbody Fusion of Lumbar Five-Sacral One, Percutaneous Screws Lumbar Two through Sacral One  . MELANOMA EXCISION     knee 08 lft. elbow 11rt  . SALPINGOOPHORECTOMY Bilateral 06/26/2016   Procedure: SALPINGO OOPHORECTOMY;  Surgeon: Megan Salon, MD;  Location: [facility];  Service: Gynecology;  Laterality: Bilateral;  . SHOULDER SURGERY Right 04/28/2015  . TUBAL LIGATION  1986  . WLE  06/17/07   and left groin lymph node    SOCIAL HISTORY: Social History   Socioeconomic History  . Marital status: Married    Spouse name: Not on file  . Number of children: Not on file  . Years of education: Not on file  . Highest education level: Not on file  Occupational History  . Not on file  Social Needs  . Financial resource strain: Not on file  . Food insecurity:    Worry: Not on file    Inability: Not on file  . Transportation needs:    Medical: Not on file    Non-medical: Not on file  Tobacco Use  . Smoking status: Never Smoker  . Smokeless tobacco: Never Used  Substance and Sexual Activity  . Alcohol use: Yes    Alcohol/week: 0.0 standard drinks    Comment: occ  . Drug use: No  . Sexual activity: Not on file  Lifestyle  . Physical activity:    Days per week: Not on file    Minutes per session: Not on file  . Stress: Not on file  Relationships  . Social connections:    Talks on phone: Not on file    Gets together: Not on file    Attends religious service: Not on file    Active member of club or organization: Not on file    Attends meetings of clubs or organizations: Not on file    Relationship status: Not on file  . Intimate partner violence:    Fear of current or ex partner: Not on file    Emotionally abused: Not on file    Physically abused: Not on file    Forced sexual activity: Not on file  Other Topics Concern  . Not on file  Social History Narrative  . Not on file     FAMILY HISTORY: Family History  Problem Relation Age of Onset  . Diabetes Father        borderline  . Melanoma Father  dx 50s/60s; deceased 25s  . Congestive Heart Failure Father   . Colon cancer Mother        dx 93s; deceased 47  . Hypertension Mother   . Osteoporosis Mother   . Ovarian cancer Maternal Grandmother        deceased 25s  . Heart disease Paternal Grandfather   . Melanoma Daughter        on leg; dx 51s    ALLERGIES:  is allergic to sulfur.  MEDICATIONS:  Current Outpatient Medications  Medication Sig Dispense Refill  . acetaminophen (TYLENOL) 500 MG tablet Take 1,000 mg by mouth every 6 (six) hours as needed for mild pain. Reported on 01/23/2016    . Ascorbic Acid (VITAMIN C WITH ROSE HIPS) 1000 MG tablet Take 1,000 mg by mouth daily. Reported on 01/23/2016    . Calcium 500-100 MG-UNIT CHEW Chew by mouth daily.    . cetirizine (ZYRTEC) 10 MG tablet Take 10 mg by mouth daily. Reported on 01/23/2016    . famotidine (PEPCID) 10 MG tablet Take 10 mg by mouth daily as needed for heartburn. Reported on 01/23/2016    . FIBER COMPLETE PO Take by mouth daily.    Marland Kitchen HYDROcodone-acetaminophen (NORCO) 5-325 MG tablet Take 1-2 tablets by mouth every 6 (six) hours as needed for moderate pain or severe pain. 20 tablet 0  . ibuprofen (ADVIL,MOTRIN) 200 MG tablet Take 4 tablets (800 mg total) by mouth every 6 (six) hours as needed for moderate pain. Reported on 01/23/2016 30 tablet 0  . Lactobacillus Rhamnosus, GG, (CULTURELLE PO) Take 1 capsule by mouth daily.    Marland Kitchen losartan-hydrochlorothiazide (HYZAAR) 100-12.5 MG per tablet Take 1 tablet by mouth daily. Reported on 01/23/2016    . meclizine (ANTIVERT) 25 MG tablet Take 25 mg by mouth 3 (three) times daily as needed for dizziness. Reported on 01/23/2016    . Multiple Vitamin (MULTIVITAMIN WITH MINERALS) TABS tablet Take 1 tablet by mouth daily. Reported on 01/23/2016    . ondansetron (ZOFRAN ODT) 4 MG disintegrating tablet 29m ODT q4  hours prn nausea/vomit 10 tablet 0  . pantoprazole (PROTONIX) 40 MG tablet Take 40 mg by mouth daily. Reported on 01/23/2016    . polyethylene glycol (MIRALAX / GLYCOLAX) packet Take 8.5 g by mouth daily as needed for mild constipation. Reported on 01/23/2016    . raloxifene (EVISTA) 60 MG tablet Take 1 tablet (60 mg total) by mouth daily. 90 tablet 3  . tizanidine (ZANAFLEX) 2 MG capsule Take 4 mg by mouth at bedtime as needed for muscle spasms. Reported on 01/23/2016    . traMADol (ULTRAM) 50 MG tablet Take 1 tablet by mouth as needed.    . venlafaxine XR (EFFEXOR-XR) 75 MG 24 hr capsule Take 1 capsule (75 mg total) by mouth daily with breakfast. 90 capsule 3   No current facility-administered medications for this visit.    REVIEW OF SYSTEMS:   Constitutional: Denies fevers, chills or abnormal night sweats (+) hot flashes, manageable Eyes: Denies blurriness of vision, double vision or watery eyes Ears, nose, mouth, throat, and face: Denies mucositis or sore throat Respiratory: Denies cough, dyspnea or wheezes Cardiovascular: Denies palpitation, chest discomfort or lower extremity swelling Gastrointestinal:  Denies nausea, heartburn or change in bowel habits Skin: Denies abnormal skin rashes MSK: (+) chronic back pain Lymphatics: Denies new lymphadenopathy or easy bruising Neurological:Denies numbness, tingling or new weaknesses Behavioral/Psych: Mood is stable, no new changes  BREAST: tenderness of right breast  All other  systems were reviewed with the patient and are negative.  PHYSICAL EXAMINATION:  ECOG PERFORMANCE STATUS: 0 - Asymptomatic  Vitals:   05/16/18 1051  BP: 118/89  Pulse: 67  Resp: 17  Temp: 98.5 F (36.9 C)  SpO2: 99%   Filed Weights   05/16/18 1051  Weight: 121 lb 1.6 oz (54.9 kg)    GENERAL:alert, no distress and comfortable SKIN: skin color, texture, turgor are normal, no rashes or significant lesions EYES: normal, conjunctiva are pink and non-injected,  sclera clear OROPHARYNX:no exudate, no erythema and lips, buccal mucosa, and tongue normal  NECK: supple, thyroid normal size, non-tender, without nodularity LYMPH:  no palpable lymphadenopathy in the cervical, axillary or inguinal LUNGS: clear to auscultation and percussion with normal breathing effort HEART: regular rate & rhythm and no murmurs and no lower extremity edema ABDOMEN:abdomen soft, non-tender and normal bowel sounds Musculoskeletal:no cyanosis of digits and no clubbing  PSYCH: alert & oriented x 3 with fluent speech NEURO: no focal motor/sensory deficits BREAST: (+) S/p right breast lumpectomy: She has palpable mass 2-3 cm in RUQ of right breast, very movable, slightly tender. No other palpable mass or adenopathy   LABORATORY DATA:  I have reviewed the data as listed CBC Latest Ref Rng & Units 05/16/2018 11/27/2017 06/27/2016  WBC 3.9 - 10.3 K/uL 4.6 6.8 9.5  Hemoglobin 11.6 - 15.9 g/dL 12.6 12.7 10.6(L)  Hematocrit 34.8 - 46.6 % 36.8 37.3 30.6(L)  Platelets 145 - 400 K/uL 233 330 249    CMP Latest Ref Rng & Units 05/16/2018 11/27/2017 06/27/2016  Glucose 70 - 99 mg/dL 85 75 112(H)  BUN 8 - 23 mg/dL 21 22 24(H)  Creatinine 0.44 - 1.00 mg/dL 0.83 0.85 0.89  Sodium 135 - 145 mmol/L 138 139 137  Potassium 3.5 - 5.1 mmol/L 4.2 3.8 3.6  Chloride 98 - 111 mmol/L 100 101 103  CO2 22 - 32 mmol/L '31 28 28  ' Calcium 8.9 - 10.3 mg/dL 9.8 10.0 8.5(L)  Total Protein 6.5 - 8.1 g/dL 6.6 7.3 -  Total Bilirubin 0.3 - 1.2 mg/dL 0.3 0.4 -  Alkaline Phos 38 - 126 U/L 64 76 -  AST 15 - 41 U/L 25 20 -  ALT 0 - 44 U/L 23 20 -    PATHOLOGY  Diagnosis, 12/25/2017 Breast, lumpectomy, Right w/seed - DUCTAL CARCINOMA IN SITU WITH CALCIFICATIONS, LOW GRADE, SPANNING 1.3 CM. - THE SURGICAL RESECTION MARGINS ARE NEGATIVE FOR CARCINOMA.   Diagnosis 11/19/17 Breast, right, needle core biopsy - DUCTAL CARCINOMA IN SITU WITH CALCIFICATIONS. - SEE COMMENT. Microscopic Comment The carcinoma appears  intermediate grade. Estrogen receptor and progesterone receptor studies will be performed and the results reported separately The results were called to [facility] on 11/20/2017. (JBK:kh 11/20/17). PROGNOSTIC INDICATORS Results: IMMUNOHISTOCHEMICAL AND MORPHOMETRIC ANALYSIS PERFORMED MANUALLY Estrogen Receptor: 90%, POSITIVE, STRONG STAINING INTENSITY Progesterone Receptor: 25%, POSITIVE, STRONG STAINING INTENSITY   RADIOGRAPHIC STUDIES: I have personally reviewed the radiological images as listed and agreed with the findings in the report. No results found.   Diagnostic Mammogram Right breast 11/07/17 IMPRESSION:  The results of the exam were reviewed with the patient.  The 3 cm are of grouped branching linear calcifications in the right breast lower inner quadrant middle depth are at moderate concern but not classic for malignancy.  A Stereotactic biopsy is recommended.    Bone Density Scan 10/30/17  NORMAL Lowest site measured: Left Femoral Neck  T-Score: -0.4  ASSESSMENT & PLAN:  Melissa Mcclure is  a 62 y.o. Caucasian female with a history of  GERD, H/o Melanoma of the left knee and right arm, Chronic back pain, heart murmur, HTN, and vertigo.   1. Ductal carcinoma in situ (DCIS) of right breast, ER/PR Positive, Grade 1 -I previously reviewed her surgical pathology findings with patient, which showed low-grade DCIS, ER PR positive. Margins were clear. -Her DCIS is cured by complete surgical resection. Any form of adjuvant therapy is preventive.  -She completed adjuvant radiation, 01/20/18 - 02/14/18, which will reduce her risk of future risk of right breast cancer.  -Giving the strong ER and PR expression in her postmenopausal status, I previously discussed the option of adjuvant endocrine therapy with Tamoxifen, Raloxifene or Anastrozole for a total of 5 years, which decrease her risk of future breast cancer by ~40%. I previously recommended Raloxifene. The potential side  effects were discussed with her in great details. She voiced good understanding, and agrees to proceed.  -We previously discussed the breast cancer surveillance after her surgery. She will continue annual screening mammogram, self exam, and a routine office visit with lab and exam with [place]. -Due to her hot flashes she started Effexor in 01/2018 and then started Raloxifene in 02/2018.  -She has been tolerating Raloxifene well with Hot flashes managed with Effexor 37.2m twice daily. Continue Raloxifene.  -She does have Tramadol which she takes as needed for chronic back pain. I discussed there is drug interaction between Tramadol and Effexor. I advised her to use tylenol for regular pain and only Tramadol for severe pain. -She is clinically doing well. Lab reviewed, her CBC and CMP are within normal limits. Her physical exam was unremarkable, except lumpy breast tissue spanning about 3cm in right breast. I will get breast US/MM to rule out recurrence, although I have low suspicion for this.  -F/u in 6 months -Plans to have colonoscopy in 05/2018. She is fine to hold Raloxifene around procedure date.    2. Genetic Counseling  -Given her moderate family history of cancer she is eligible for genetic testing. She is agreeable. -I previously referred her to genetics, her 12/23/17 testing did not reveal a pathogenic mutation in any of the genes analyzed, including lynch syndrome or other colon cancer genes.  -I previously explained environmental elements and health problems that can increase risk of breast cancer to pt and her family.   3. HTN, history of skin melanoma -Follow-up with PCP  4. Hot Flashes -Dr. SJaneann Merlpreviously prescribed the pt 100 mg gabapentin for which she takes 2 tablets at night for her hot flashes. This medication was only mildly working for her, but she would like a more effective medication if possible.  -I previously discussed Effexor to aid with her hot flashes and I informed  the patient that if she chooses Effexor, then she will d/c use of gabapentin. She agreed.  -I prescribed her 37.5 mg Effexor daily on 02/14/18. Her hot flashes are overall controlled with Effexor 2 pills daily, will continue.  I refilled Effexor 75 mg daily for her today.   PLAN:  -I refilled her Raloxifene and Effexor 743mtoday -Diagnostic mammogram and USKoreaf R breast in 2 weeks for right breast lump  -Lab and f/u in 6 months    Orders Placed This Encounter  Procedures  . MM DIAG BREAST TOMO UNI RIGHT    Standing Status:   Future    Standing Expiration Date:   05/17/2019    Scheduling Instructions:  [facility]    Order Specific Question:   Reason for Exam (SYMPTOM  OR DIAGNOSIS REQUIRED)    Answer:   right UOQ mass, rule out recurrence    Order Specific Question:   Preferred imaging location?    Answer:   External  . US BREAST LTD UNI RIGHT INC AXILLA    Standing Status:   Future    Standing Expiration Date:   07/17/2019    Scheduling Instructions:     [facility]    Order Specific Question:   Reason for Exam (SYMPTOM  OR DIAGNOSIS REQUIRED)    Answer:   right braest UOQ mass, rule out cancer    Order Specific Question:   Preferred imaging location?    Answer:   External    All questions were answered. The patient knows to call the clinic with any problems, questions or concerns. I spent 20 minutes counseling the patient face to face. The total time spent in the appointment was 25 minutes and more than 50% was on counseling.     Truitt Merle, MD 05/16/2018 11:27 AM  I, Joslyn Devon, am acting as scribe for Truitt Merle, MD.   I have reviewed the above documentation for accuracy and completeness, and I agree with the above.

## 2018-05-16 ENCOUNTER — Inpatient Hospital Stay (HOSPITAL_BASED_OUTPATIENT_CLINIC_OR_DEPARTMENT_OTHER): Payer: BLUE CROSS/BLUE SHIELD | Admitting: Hematology

## 2018-05-16 ENCOUNTER — Other Ambulatory Visit: Payer: Self-pay | Admitting: Hematology

## 2018-05-16 ENCOUNTER — Encounter: Payer: Self-pay | Admitting: Hematology

## 2018-05-16 ENCOUNTER — Inpatient Hospital Stay: Payer: BLUE CROSS/BLUE SHIELD | Attending: Hematology

## 2018-05-16 ENCOUNTER — Telehealth: Payer: Self-pay

## 2018-05-16 VITALS — BP 118/89 | HR 67 | Temp 98.5°F | Resp 17 | Ht 62.5 in | Wt 121.1 lb

## 2018-05-16 DIAGNOSIS — Z800 Family history of malignant neoplasm of digestive organs: Secondary | ICD-10-CM | POA: Insufficient documentation

## 2018-05-16 DIAGNOSIS — I10 Essential (primary) hypertension: Secondary | ICD-10-CM | POA: Insufficient documentation

## 2018-05-16 DIAGNOSIS — M545 Low back pain: Secondary | ICD-10-CM | POA: Diagnosis not present

## 2018-05-16 DIAGNOSIS — G8929 Other chronic pain: Secondary | ICD-10-CM

## 2018-05-16 DIAGNOSIS — Z85820 Personal history of malignant melanoma of skin: Secondary | ICD-10-CM | POA: Diagnosis not present

## 2018-05-16 DIAGNOSIS — N951 Menopausal and female climacteric states: Secondary | ICD-10-CM

## 2018-05-16 DIAGNOSIS — D0511 Intraductal carcinoma in situ of right breast: Secondary | ICD-10-CM | POA: Diagnosis not present

## 2018-05-16 DIAGNOSIS — Z79899 Other long term (current) drug therapy: Secondary | ICD-10-CM

## 2018-05-16 DIAGNOSIS — Z170 Estrogen receptor positive status [ER+]: Secondary | ICD-10-CM | POA: Insufficient documentation

## 2018-05-16 LAB — CBC WITH DIFFERENTIAL (CANCER CENTER ONLY)
BASOS ABS: 0 10*3/uL (ref 0.0–0.1)
BASOS PCT: 1 %
EOS ABS: 0.1 10*3/uL (ref 0.0–0.5)
EOS PCT: 1 %
HCT: 36.8 % (ref 34.8–46.6)
Hemoglobin: 12.6 g/dL (ref 11.6–15.9)
Lymphocytes Relative: 21 %
Lymphs Abs: 0.9 10*3/uL (ref 0.9–3.3)
MCH: 31.7 pg (ref 25.1–34.0)
MCHC: 34.3 g/dL (ref 31.5–36.0)
MCV: 92.6 fL (ref 79.5–101.0)
MONO ABS: 0.5 10*3/uL (ref 0.1–0.9)
MONOS PCT: 11 %
Neutro Abs: 3.1 10*3/uL (ref 1.5–6.5)
Neutrophils Relative %: 66 %
PLATELETS: 233 10*3/uL (ref 145–400)
RBC: 3.97 MIL/uL (ref 3.70–5.45)
RDW: 12.8 % (ref 11.2–14.5)
WBC Count: 4.6 10*3/uL (ref 3.9–10.3)

## 2018-05-16 LAB — CMP (CANCER CENTER ONLY)
ALBUMIN: 4 g/dL (ref 3.5–5.0)
ALT: 23 U/L (ref 0–44)
ANION GAP: 7 (ref 5–15)
AST: 25 U/L (ref 15–41)
Alkaline Phosphatase: 64 U/L (ref 38–126)
BILIRUBIN TOTAL: 0.3 mg/dL (ref 0.3–1.2)
BUN: 21 mg/dL (ref 8–23)
CO2: 31 mmol/L (ref 22–32)
Calcium: 9.8 mg/dL (ref 8.9–10.3)
Chloride: 100 mmol/L (ref 98–111)
Creatinine: 0.83 mg/dL (ref 0.44–1.00)
GFR, Est AFR Am: >60 mL/min (ref >60)
GFR, Estimated: >60 mL/min (ref >60)
GLUCOSE: 85 mg/dL (ref 70–99)
Potassium: 4.2 mmol/L (ref 3.5–5.1)
SODIUM: 138 mmol/L (ref 135–145)
TOTAL PROTEIN: 6.6 g/dL (ref 6.5–8.1)

## 2018-05-16 MED ORDER — RALOXIFENE HCL 60 MG PO TABS: 60.0000 mg | ORAL_TABLET | Freq: Every day | ORAL | 3 refills | Status: DC

## 2018-05-16 MED ORDER — VENLAFAXINE HCL ER 75 MG PO CP24: 75.0000 mg | ORAL_CAPSULE | Freq: Every day | ORAL | 3 refills | Status: DC

## 2018-05-16 NOTE — Telephone Encounter (Signed)
Printed avs and calender of upcoming. Per 8/23 los

## 2018-05-17 IMAGING — MR MR THORACIC SPINE W/O CM
4 of 6 series · 11 of 48 positions shown · non-contrast
Comparison: Lumbar MRI today, lumbar spine CT 08/25/2015 and
abdominal CT 11/02/2014.

CLINICAL DATA: Low back spasms since injury at work 8 months ago.
History of lumbar surgery in 5695.

EXAM:
MRI THORACIC SPINE WITHOUT CONTRAST
TECHNIQUE: Multiplanar, multisequence MR imaging of the thoracic spine was
performed. No intravenous contrast was administered.

[Series 5: T2 · sagittal · 4.0mm · 0.36mm/px · 3 of 16 slices shown (1 of 3)]
[im 4/16]
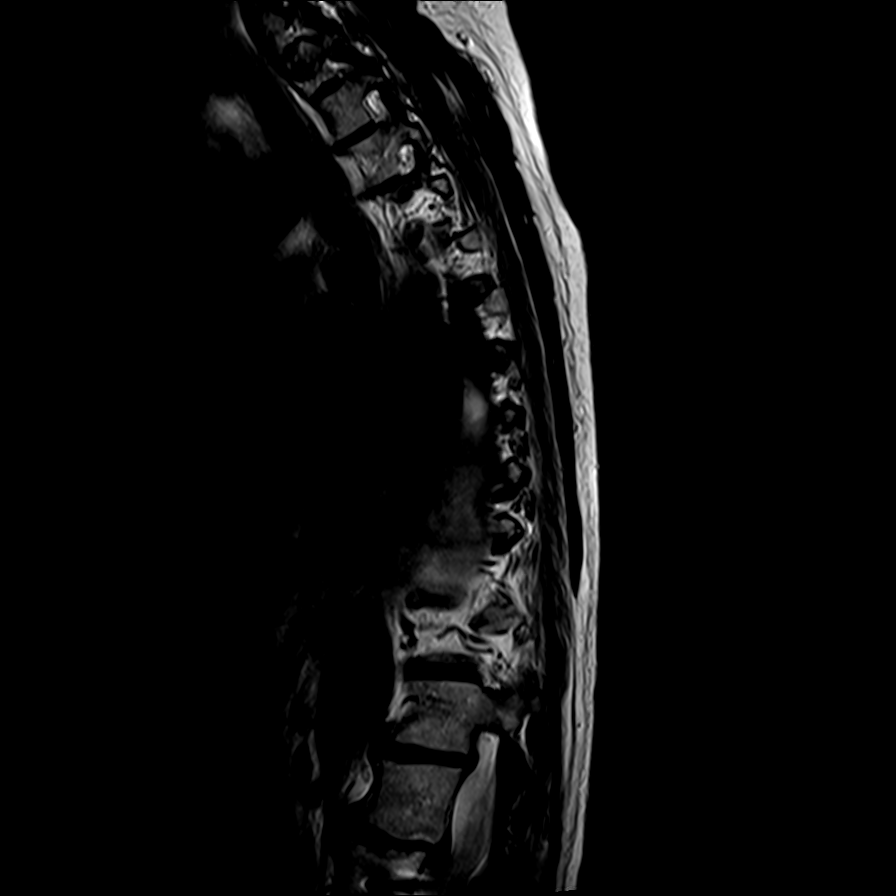
[im 10/16]
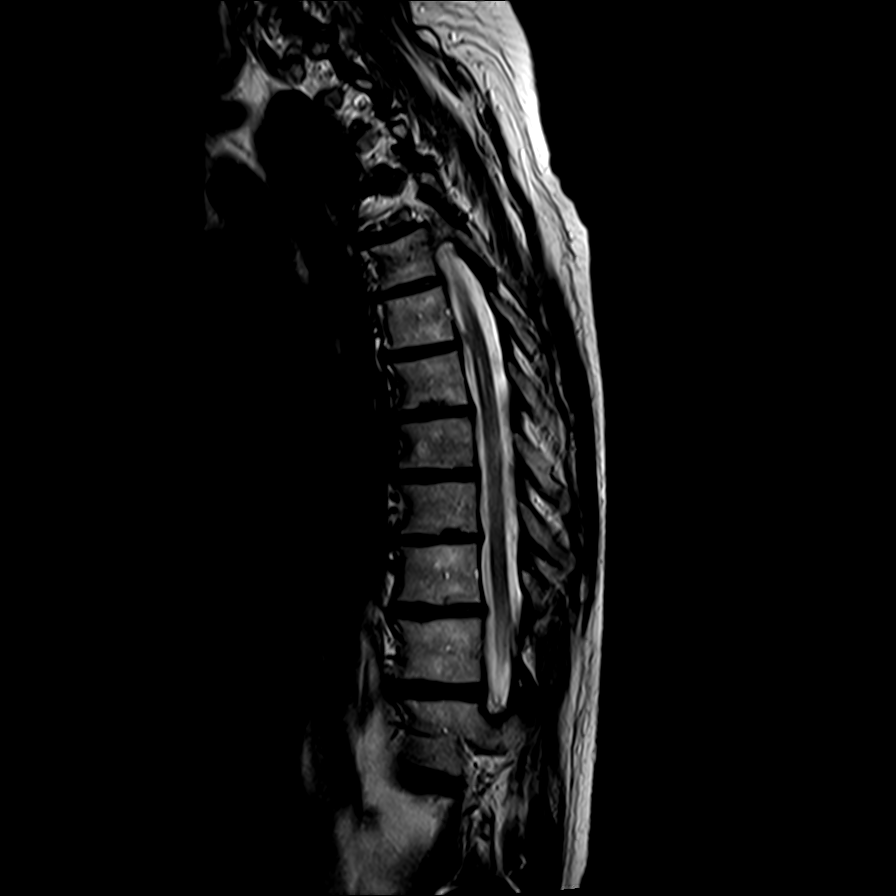
[im 16/16]
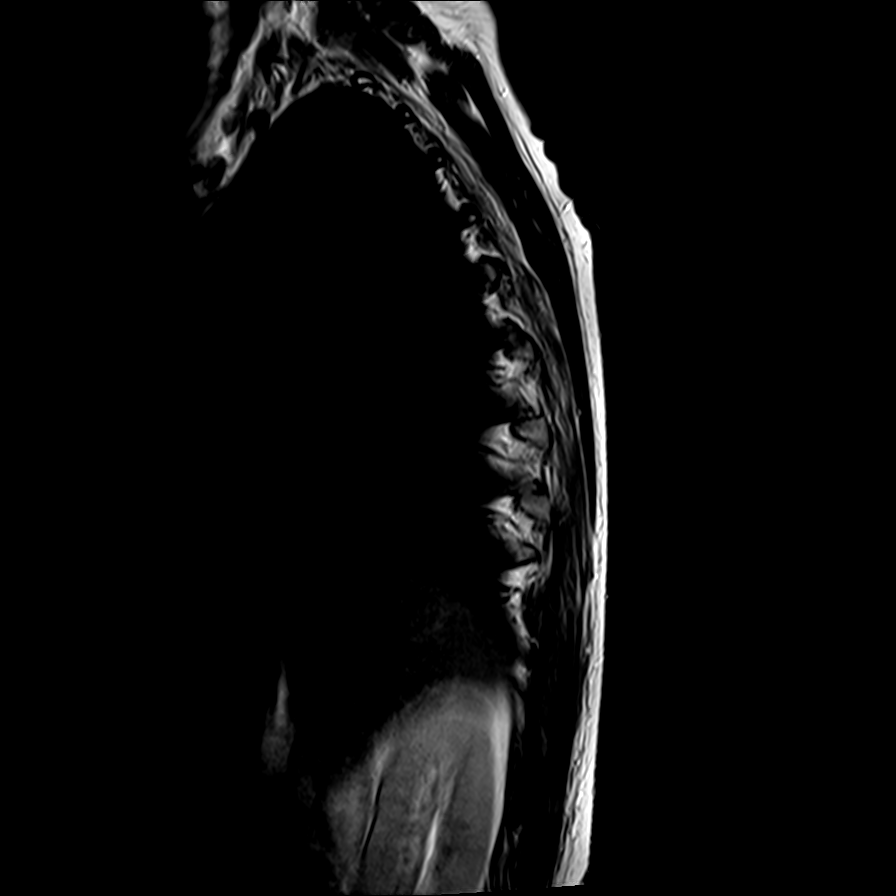

[Series 6: T1 · sagittal · 4.0mm · 0.42mm/px · 2 of 16 slices shown]
[im 4/16]
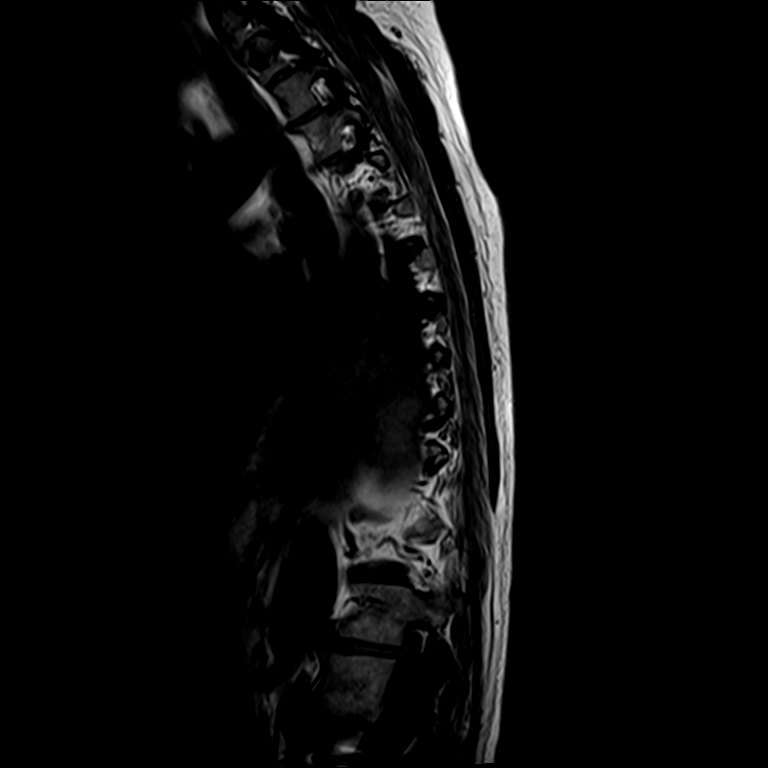
[im 10/16]
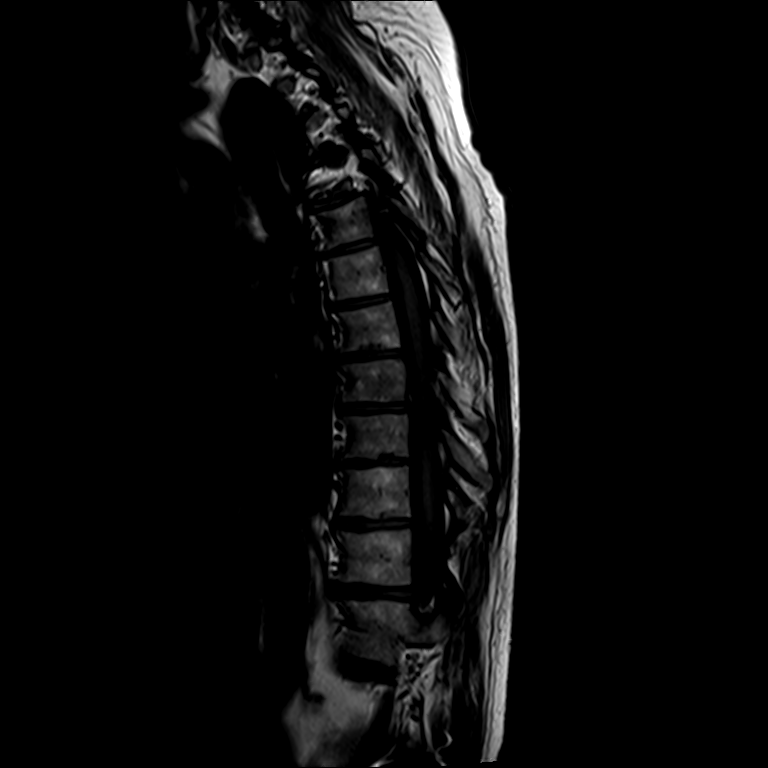

[Series 8: T2 · axial · 4.0mm · 0.43mm/px · z∈[-246,-105]mm · 3 of 39 slices shown (2 of 3)]
[im 6/39]
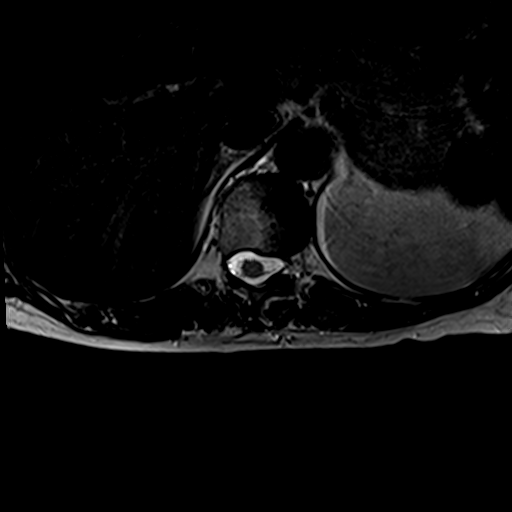
[im 21/39]
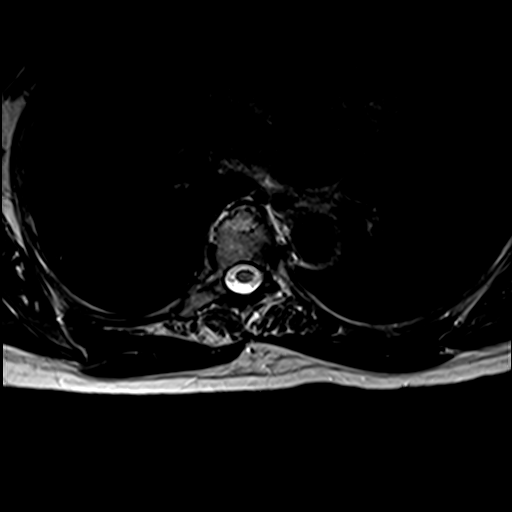
[im 33/39]
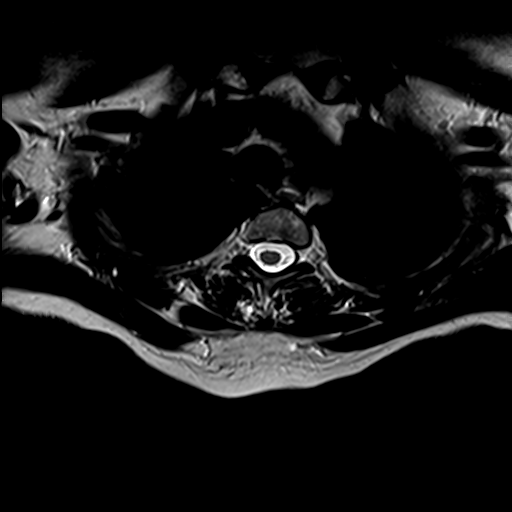

[Series 9: T2 · axial · 4.0mm · 0.43mm/px · z∈[-246,-99]mm · 3 of 39 slices shown (3 of 3)]
[im 6/39]
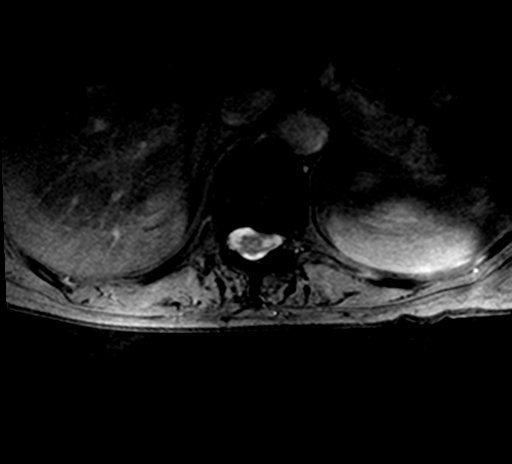
[im 21/39]
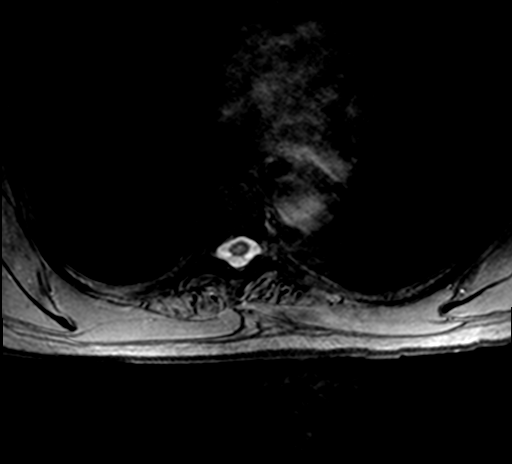
[im 33/39]
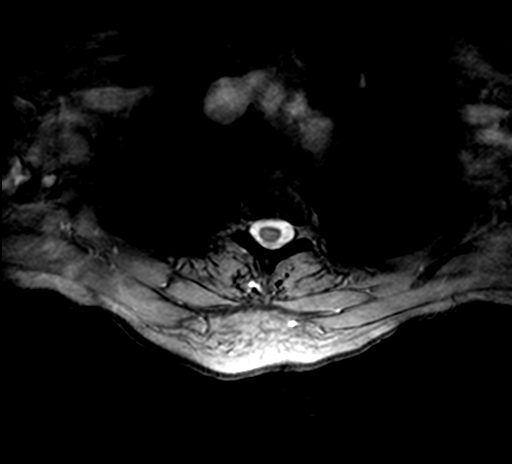

[11 of 48 positions shown; findings below may reference images not displayed]

FINDINGS: Alignment: Moderate convex right scoliosis centered at T8. The
lateral alignment is near anatomic.

Vertebrae: No acute or suspicious osseous findings.

Cord: A small syrinx is noted at the C7 and T1 levels. There is also
mild dilatation of the central cord canal at the T6 and T7 levels.
The thoracic cord is otherwise normal in signal and caliber. The
conus medullaris extends to the T12-L1 level.

Paraspinal and other soft tissues: No significant paraspinal
abnormalities. There is a moderate to large hiatal hernia.

Disc levels:

Sagittal images through the cervical spine demonstrate multilevel
spondylosis with disc bulging and uncinate spurring. There are
possible central disc protrusions at C5-6 and C6-7.

There is mild disc degeneration from C7-T1 through T5-6 without
focal disc herniation, spinal stenosis or nerve root encroachment.

T6-7: There is a right paracentral disc protrusion with mild mass
effect on the cord. No foraminal compromise.

Mild disc degeneration from T7-8 through T10-11. There is asymmetric
left-sided facet hypertrophy at T10-11 contributing to mild left
foraminal narrowing. No significant spinal stenosis or nerve root
encroachment.

T11-12: Shallow left paracentral disc protrusion without conus
compression. No foraminal compromise.

T12-L1: Mild disc bulging. No spinal stenosis or nerve root
encroachment.
IMPRESSION: 1. There are degenerative changes throughout the thoracic spine with
small disc protrusions as described. No cord deformity, significant
foraminal compromise or definite nerve root encroachment.
2. Scoliosis without acute osseous findings.
3. Mild dilatation of the central cord canal at C7-T1 and T6-7,
likely incidental.
4. Cervical spondylosis, incompletely characterized, but without
gross cord deformity.
5. Moderate size hiatal hernia.

## 2018-05-17 IMAGING — MR MR LUMBAR SPINE WO/W CM
7 series · 45 of 48 positions shown · IV contrast (multihance)
Comparison: Lumbar spine CT 08/25/2015.  Lumbar MRI 08/16/2012.

CLINICAL DATA: Low back spasms since injury at work 8 months ago.
History of lumbar surgery in 6482.

EXAM:
MRI LUMBAR SPINE WITHOUT AND WITH CONTRAST
TECHNIQUE: Multiplanar and multiecho pulse sequences of the lumbar spine were
obtained without and with intravenous contrast.
CONTRAST:  13mL MULTIHANCE GADOBENATE DIMEGLUMINE 529 MG/ML IV SOLN

[Series 3: T1 · sagittal · 4.0mm · 0.88mm/px · 4 of 15 slices shown (1 of 3)]
[im 1/15]
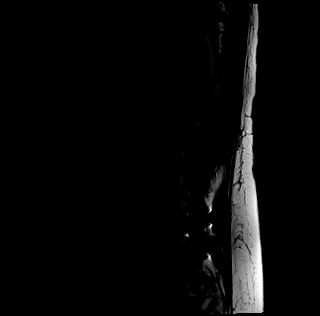
[im 5/15]
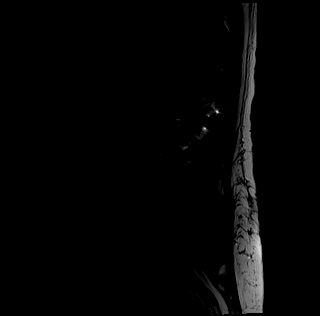
[im 10/15]
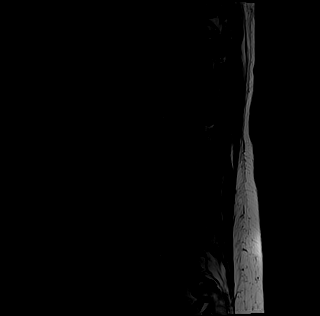
[im 15/15]
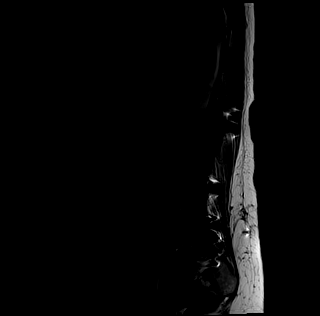

[Series 4: tirm sag · sagittal · 4.0mm · 0.55mm/px · 4 of 15 slices shown]
[im 1/15]
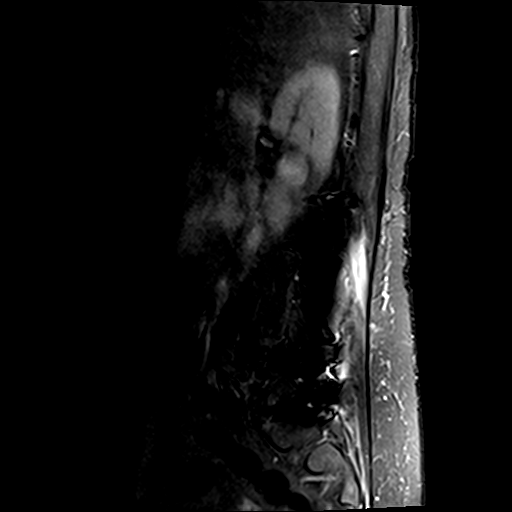
[im 5/15]
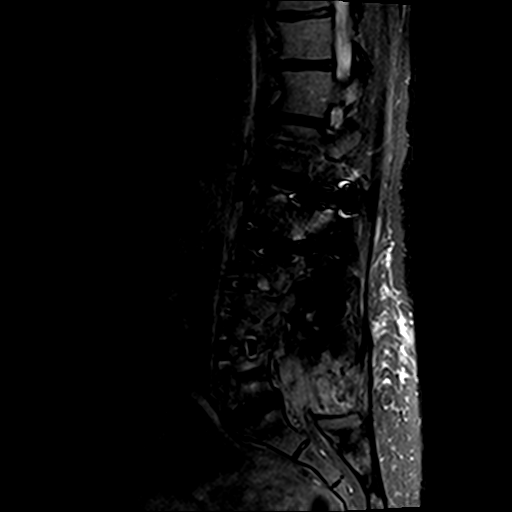
[im 10/15]
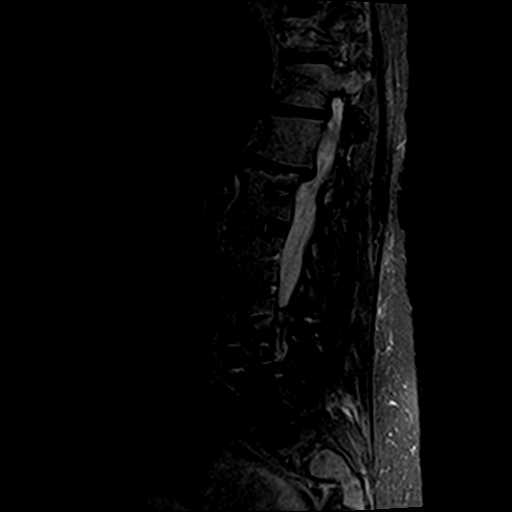
[im 15/15]
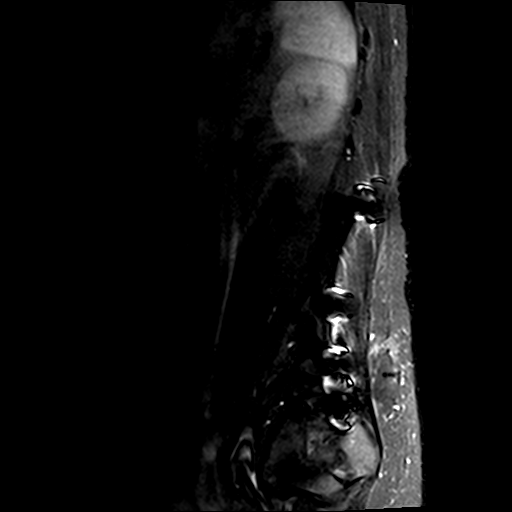

[Series 5: T1 · axial · 4.0mm · 0.70mm/px · z∈[-72,+97]mm · 9 of 32 slices shown (2 of 3)]
[im 1/32]
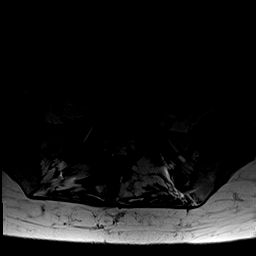
[im 4/32]
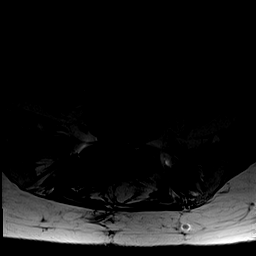
[im 7/32]
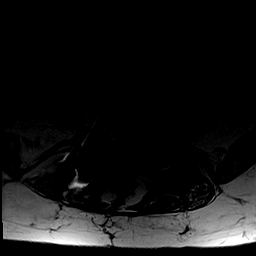
[im 11/32]
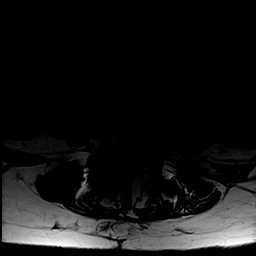
[im 14/32]
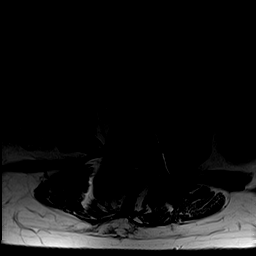
[im 18/32]
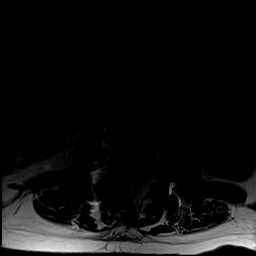
[im 21/32]
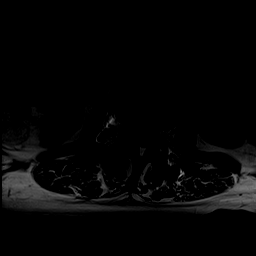
[im 28/32]
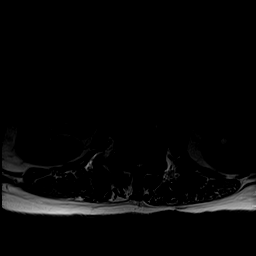
[im 32/32]
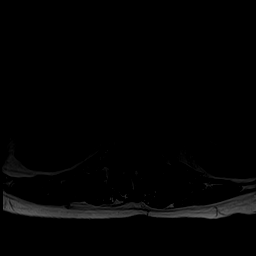

[Series 6: T2 · axial · 4.0mm · 0.70mm/px · z∈[-72,+97]mm · 10 of 32 slices shown (1 of 2)]
[im 1/32]
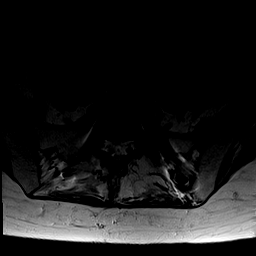
[im 4/32]
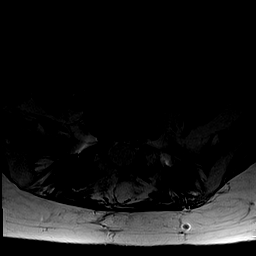
[im 7/32]
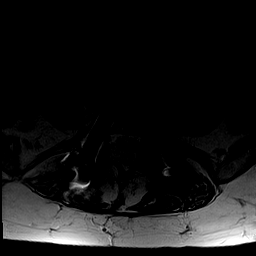
[im 11/32]
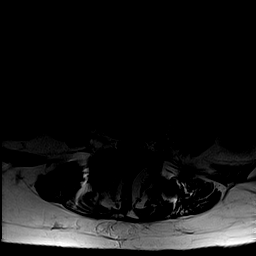
[im 14/32]
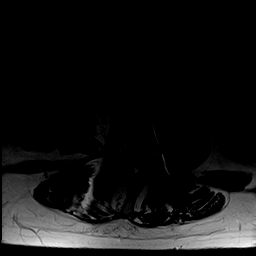
[im 18/32]
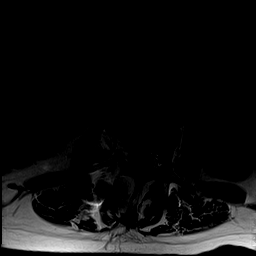
[im 21/32]
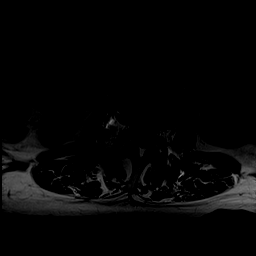
[im 25/32]
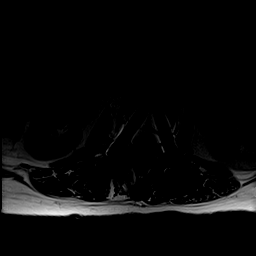
[im 28/32]
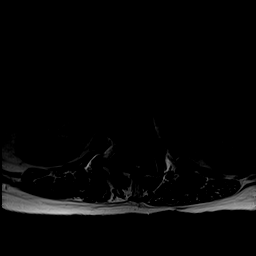
[im 32/32]
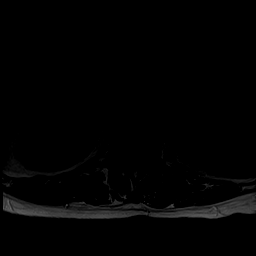

[Series 7: T2 · sagittal · 4.0mm · 0.88mm/px · 5 of 15 slices shown (2 of 2)]
[im 1/15]
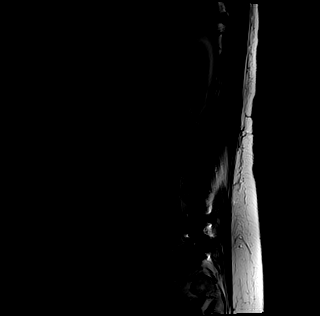
[im 4/15]
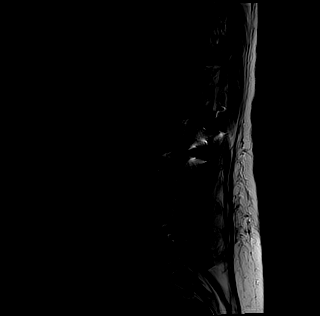
[im 8/15]
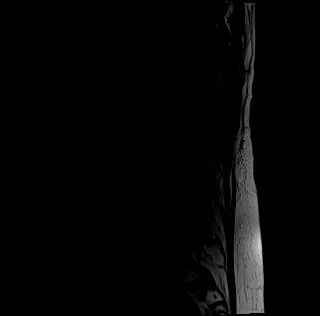
[im 11/15]
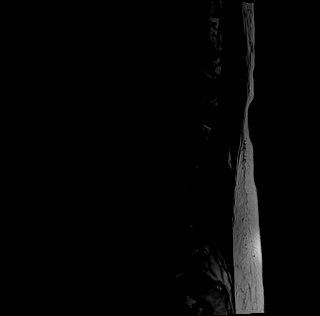
[im 15/15]
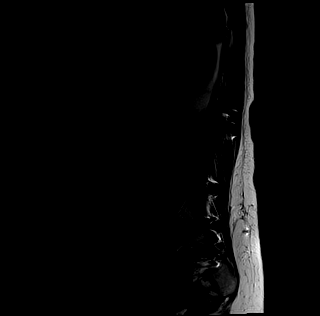

[Series 8: T1 fat-sat post-contrast · sagittal · 4.0mm · 0.88mm/px · 5 of 15 slices shown]
[im 1/15]
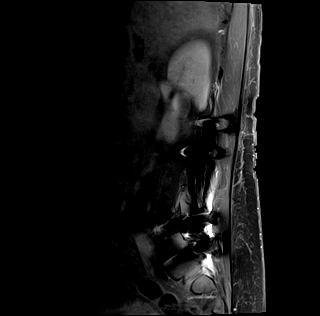
[im 4/15]
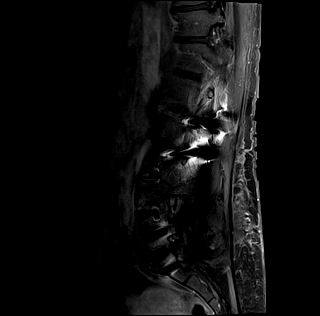
[im 8/15]
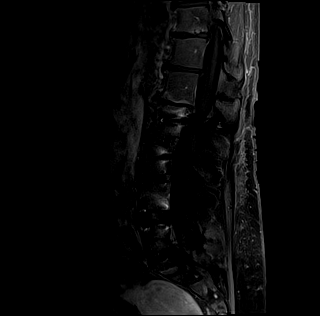
[im 11/15]
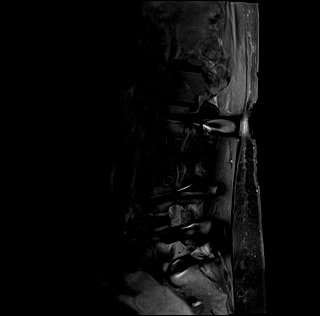
[im 15/15]
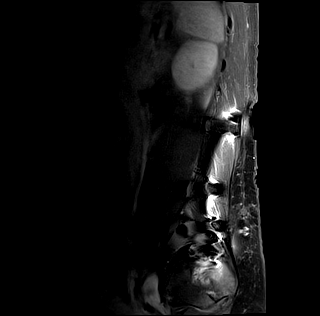

[Series 9: T1 · axial · 4.0mm · 0.70mm/px · z∈[-72,+97]mm · 8 of 32 slices shown (3 of 3)]
[im 1/32]
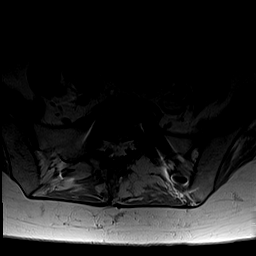
[im 4/32]
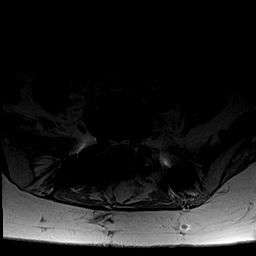
[im 11/32]
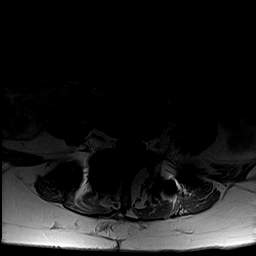
[im 14/32]
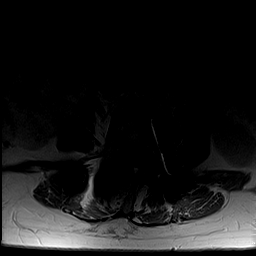
[im 18/32]
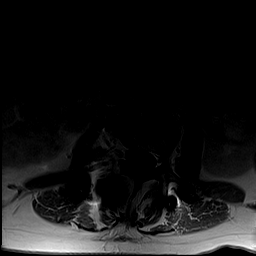
[im 21/32]
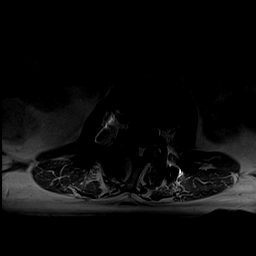
[im 28/32]
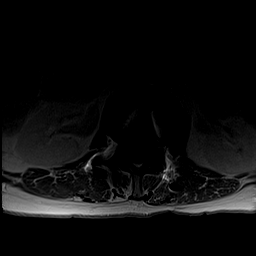
[im 32/32]
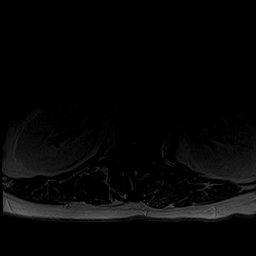

[45 of 48 positions shown; findings below may reference images not displayed]

FINDINGS: Segmentation: Conventional anatomy assumed, with the last open disc
space designated L5-S1.

Alignment: Stable convex left scoliosis and grade 1 retrolisthesis
at L1-2.

Vertebrae: There are stable postsurgical changes status post L2
through S1 fusion. No evidence of hardware displacement. There is
progressive endplate degeneration asymmetric to the right at L4-5.
No solid interbody fusion was demonstrated at that level on the
prior CT. The visualized sacroiliac joints appear unremarkable.

Conus medullaris: Extends to the T12-L1 level and appears normal.
There is no abnormal intradural enhancement.

Paraspinal and other soft tissues: No significant paraspinal
findings.

Disc levels:

Lower thoracic spine findings are dictated on the separate
examination.

L1-2: Chronic loss of disc height with endplate osteophytes and
grade 1 retrolisthesis, stable from postoperative CT, but
progressive from the preoperative MRI. There is mild mass effect on
the thecal sac with mild narrowing of the right lateral recess. The
foramina are patent. No definite nerve root encroachment.

L2-3: The spinal canal and neural foramina are widely patent status
post PLIF.

L3-4: The spinal canal and neural foramina are widely patent status
post PLIF.

L4-5: The spinal canal and neural foramina are widely patent status
post laminectomy and PLIF. As above, there are endplate changes at
this level which may indicate nonunion. These have not clearly
progressed from the CT.

L5-S1: Stable appearance status post laminectomy and PLIF. There are
small residual posterior osteophytes and mild scarring around the S1
nerve roots. No evidence of nerve root encroachment.
IMPRESSION: 1. No demonstrated acute findings.
2. Stable alignment status post L2 through S1 fusion. There is no
residual spinal stenosis or nerve root encroachment at the operative
levels.
3. Endplate changes at L4-5 imply possible nonunion as correlated
with prior CT.
4. Adjacent segment disease at L1-2 is unchanged from the CT of 6
months ago with mild mass effect on the thecal sac and mild
narrowing of the right lateral recess.

## 2018-05-21 ENCOUNTER — Telehealth: Payer: Self-pay

## 2018-05-21 NOTE — Telephone Encounter (Signed)
Faxed signed order for diagnostic mammogram to [facility].  

## 2018-05-22 DIAGNOSIS — R921 Mammographic calcification found on diagnostic imaging of breast: Secondary | ICD-10-CM | POA: Diagnosis not present

## 2018-05-22 DIAGNOSIS — Z853 Personal history of malignant neoplasm of breast: Secondary | ICD-10-CM | POA: Diagnosis not present

## 2018-06-04 ENCOUNTER — Other Ambulatory Visit: Payer: Self-pay | Admitting: Radiology

## 2018-06-04 DIAGNOSIS — N6011 Diffuse cystic mastopathy of right breast: Secondary | ICD-10-CM | POA: Diagnosis not present

## 2018-06-04 DIAGNOSIS — M65332 Trigger finger, left middle finger: Secondary | ICD-10-CM | POA: Diagnosis not present

## 2018-06-04 DIAGNOSIS — M1811 Unilateral primary osteoarthritis of first carpometacarpal joint, right hand: Secondary | ICD-10-CM | POA: Diagnosis not present

## 2018-06-16 ENCOUNTER — Other Ambulatory Visit: Payer: Self-pay

## 2018-06-16 ENCOUNTER — Ambulatory Visit: Payer: Self-pay | Admitting: Hematology

## 2018-06-20 ENCOUNTER — Other Ambulatory Visit: Payer: Self-pay

## 2018-06-20 ENCOUNTER — Other Ambulatory Visit: Payer: Self-pay | Admitting: Gastroenterology

## 2018-06-20 ENCOUNTER — Encounter (HOSPITAL_COMMUNITY): Payer: Self-pay | Admitting: *Deleted

## 2018-06-23 ENCOUNTER — Encounter: Payer: Self-pay | Admitting: Obstetrics & Gynecology

## 2018-06-23 ENCOUNTER — Ambulatory Visit (HOSPITAL_COMMUNITY)
Admission: RE | Admit: 2018-06-23 | Discharge: 2018-06-23 | Disposition: A | Discharge status: Home / Self Care | Payer: BLUE CROSS/BLUE SHIELD | Source: Ambulatory Visit | Attending: Gastroenterology | Admitting: Gastroenterology

## 2018-06-23 ENCOUNTER — Encounter (HOSPITAL_COMMUNITY)
Admission: RE | Disposition: A | Discharge status: Home / Self Care | Payer: Self-pay | Source: Ambulatory Visit | Attending: Gastroenterology

## 2018-06-23 ENCOUNTER — Encounter (HOSPITAL_COMMUNITY): Payer: Self-pay | Admitting: *Deleted

## 2018-06-23 ENCOUNTER — Ambulatory Visit (HOSPITAL_COMMUNITY): Payer: BLUE CROSS/BLUE SHIELD | Admitting: Anesthesiology

## 2018-06-23 DIAGNOSIS — K644 Residual hemorrhoidal skin tags: Secondary | ICD-10-CM | POA: Diagnosis not present

## 2018-06-23 DIAGNOSIS — R42 Dizziness and giddiness: Secondary | ICD-10-CM | POA: Insufficient documentation

## 2018-06-23 DIAGNOSIS — Z8249 Family history of ischemic heart disease and other diseases of the circulatory system: Secondary | ICD-10-CM | POA: Insufficient documentation

## 2018-06-23 DIAGNOSIS — Z85828 Personal history of other malignant neoplasm of skin: Secondary | ICD-10-CM | POA: Diagnosis not present

## 2018-06-23 DIAGNOSIS — Z90710 Acquired absence of both cervix and uterus: Secondary | ICD-10-CM | POA: Diagnosis not present

## 2018-06-23 DIAGNOSIS — Z1211 Encounter for screening for malignant neoplasm of colon: Secondary | ICD-10-CM | POA: Diagnosis not present

## 2018-06-23 DIAGNOSIS — M549 Dorsalgia, unspecified: Secondary | ICD-10-CM | POA: Insufficient documentation

## 2018-06-23 DIAGNOSIS — Z833 Family history of diabetes mellitus: Secondary | ICD-10-CM | POA: Diagnosis not present

## 2018-06-23 DIAGNOSIS — K219 Gastro-esophageal reflux disease without esophagitis: Secondary | ICD-10-CM | POA: Insufficient documentation

## 2018-06-23 DIAGNOSIS — Z853 Personal history of malignant neoplasm of breast: Secondary | ICD-10-CM | POA: Insufficient documentation

## 2018-06-23 DIAGNOSIS — K5790 Diverticulosis of intestine, part unspecified, without perforation or abscess without bleeding: Secondary | ICD-10-CM | POA: Diagnosis not present

## 2018-06-23 DIAGNOSIS — G8929 Other chronic pain: Secondary | ICD-10-CM | POA: Diagnosis not present

## 2018-06-23 DIAGNOSIS — Z841 Family history of disorders of kidney and ureter: Secondary | ICD-10-CM | POA: Insufficient documentation

## 2018-06-23 DIAGNOSIS — Z79899 Other long term (current) drug therapy: Secondary | ICD-10-CM | POA: Diagnosis not present

## 2018-06-23 DIAGNOSIS — Z800 Family history of malignant neoplasm of digestive organs: Secondary | ICD-10-CM | POA: Insufficient documentation

## 2018-06-23 DIAGNOSIS — G4700 Insomnia, unspecified: Secondary | ICD-10-CM | POA: Insufficient documentation

## 2018-06-23 DIAGNOSIS — K449 Diaphragmatic hernia without obstruction or gangrene: Secondary | ICD-10-CM | POA: Diagnosis not present

## 2018-06-23 DIAGNOSIS — I10 Essential (primary) hypertension: Secondary | ICD-10-CM | POA: Diagnosis not present

## 2018-06-23 DIAGNOSIS — E7800 Pure hypercholesterolemia, unspecified: Secondary | ICD-10-CM | POA: Diagnosis not present

## 2018-06-23 DIAGNOSIS — K648 Other hemorrhoids: Secondary | ICD-10-CM | POA: Insufficient documentation

## 2018-06-23 DIAGNOSIS — K5730 Diverticulosis of large intestine without perforation or abscess without bleeding: Secondary | ICD-10-CM | POA: Insufficient documentation

## 2018-06-23 HISTORY — PX: COLONOSCOPY WITH PROPOFOL: SHX5780

## 2018-06-23 SURGERY — COLONOSCOPY WITH PROPOFOL
Anesthesia: Monitor Anesthesia Care

## 2018-06-23 MED ORDER — SODIUM CHLORIDE 0.9 % IV SOLN: INTRAVENOUS | Status: DC

## 2018-06-23 MED ORDER — LIDOCAINE 2% (20 MG/ML) 5 ML SYRINGE
INTRAMUSCULAR | Status: DC | PRN
  Administered 2018-06-23: 100 mg via INTRAVENOUS

## 2018-06-23 MED ORDER — LACTATED RINGERS IV SOLN
INTRAVENOUS | Status: DC
  Administered 2018-06-23: 09:00:00 via INTRAVENOUS

## 2018-06-23 MED ORDER — PROPOFOL 10 MG/ML IV BOLUS
INTRAVENOUS | Status: AC
  Filled 2018-06-23: qty 20

## 2018-06-23 MED ORDER — PROPOFOL 10 MG/ML IV BOLUS
INTRAVENOUS | Status: AC
  Filled 2018-06-23: qty 40

## 2018-06-23 MED ORDER — PROPOFOL 500 MG/50ML IV EMUL
INTRAVENOUS | Status: DC | PRN
  Administered 2018-06-23: 125 ug/kg/min via INTRAVENOUS

## 2018-06-23 MED ORDER — ONDANSETRON HCL 4 MG/2ML IJ SOLN
INTRAMUSCULAR | Status: DC | PRN
  Administered 2018-06-23: 4 mg via INTRAVENOUS

## 2018-06-23 MED ORDER — PROPOFOL 10 MG/ML IV BOLUS
INTRAVENOUS | Status: DC | PRN
  Administered 2018-06-23 (×3): 20 mg via INTRAVENOUS

## 2018-06-23 SURGICAL SUPPLY — 22 items

## 2018-06-23 NOTE — Anesthesia Postprocedure Evaluation (Signed)
Anesthesia Post Note  Patient: Melissa Mcclure  Procedure(s) Performed: COLONOSCOPY WITH PROPOFOL (N/A )     Patient location during evaluation: PACU Anesthesia Type: MAC Level of consciousness: awake and alert Pain management: pain level controlled Vital Signs Assessment: post-procedure vital signs reviewed and stable Respiratory status: spontaneous breathing, nonlabored ventilation, respiratory function stable and patient connected to nasal cannula oxygen Cardiovascular status: stable and blood pressure returned to baseline Postop Assessment: no apparent nausea or vomiting Anesthetic complications: no    Last Vitals:  Vitals:   06/23/18 1225 06/23/18 1233  BP: 109/66 (!) 151/76  Pulse: 63 61  Resp: 10 13  Temp:    SpO2: 97% 99%    Last Pain:  Vitals:   06/23/18 1233  TempSrc:   PainSc: 0-No pain                 Onita Pfluger P Rosalea Withrow

## 2018-06-23 NOTE — Op Note (Signed)
[facility] Patient Name: Melissa Mcclure Procedure Date: 06/23/2018 MRN: [id number] Attending MD: Clarene Essex , MD Date of Birth: 04-23-1956 CSN: [id number] Age: 62 Admit Type: Outpatient Procedure:                Colonoscopy Indications:              Last colonoscopy: December 2015, Family history of                            colon cancer in a first-degree relative Providers:                Clarene Essex, MD, Burtis Junes, RN, William Dalton,                            Technician Referring MD:              Medicines:                Propofol total dose 380 mg IV, Ondansetron 4 mg IV,                            10 mg IV lidocaine Complications:            No immediate complications. Estimated Blood Loss:     Estimated blood loss: none. Procedure:                Pre-Anesthesia Assessment:                           - Prior to the procedure, a History and Physical                            was performed, and patient medications and                            allergies were reviewed. The patient's tolerance of                            previous anesthesia was also reviewed. The risks                            and benefits of the procedure and the sedation                            options and risks were discussed with the patient.                            All questions were answered, and informed consent                            was obtained. Prior Anticoagulants: The patient has                            taken no previous anticoagulant or antiplatelet  agents. ASA Grade Assessment: II - A patient with                            mild systemic disease. After reviewing the risks                            and benefits, the patient was deemed in                            satisfactory condition to undergo the procedure.                           After obtaining informed consent, the colonoscope                            was passed under direct  vision. Throughout the                            procedure, the patient's blood pressure, pulse, and                            oxygen saturations were monitored continuously. The                            PCF-H190DL ([id number] Olympus peds colonscope was                            introduced through the anus and advanced to the the                            cecum, identified by appendiceal orifice and                            ileocecal valve. The colonoscopy was technically                            difficult and complex due to significant looping                            and a tortuous colon. Successful completion of the                            procedure was aided by applying abdominal pressure.                            During the procedure the patient had to be                            repositioned. The quality of the bowel preparation                            was adequate to identify polyps 6 mm and larger in  size. Scope In: 11:28:37 AM Scope Out: 12:05:11 PM Scope Withdrawal Time: 0 hours 12 minutes 1 second  Total Procedure Duration: 0 hours 36 minutes 34 seconds  Findings:      External and internal hemorrhoids were found during retroflexion, during       perianal exam and during digital exam. The hemorrhoids were small.      A few small-mouthed diverticula were found in the sigmoid colon.      The exam was otherwise without abnormality. Impression:               - External and internal hemorrhoids.                           - Diverticulosis in the sigmoid colon.                           - The examination was otherwise normal.                           - No specimens collected. Moderate Sedation:      N/A- Per Anesthesia Care Recommendation:           - Patient has a contact number available for                            emergencies. The signs and symptoms of potential                            delayed complications were discussed  with the                            patient. Return to normal activities tomorrow.                            Written discharge instructions were provided to the                            patient.                           - Soft diet today.                           - Continue present medications.                           - Repeat colonoscopy in 5 years for screening                            purposes.                           - Return to GI office PRN.                           - Telephone GI clinic if symptomatic PRN. Procedure Code(s):        --- Professional ---  45378, Colonoscopy, flexible; diagnostic, including                            collection of specimen(s) by brushing or washing,                            when performed (separate procedure) Diagnosis Code(s):        --- Professional ---                           Z80.0, Family history of malignant neoplasm of                            digestive organs                           K57.30, Diverticulosis of large intestine without                            perforation or abscess without bleeding CPT copyright 2017 [employer]. All rights reserved. The codes documented in this report are preliminary and upon coder review may  be revised to meet current compliance requirements. Clarene Essex, MD 06/23/2018 12:14:27 PM This report has been signed electronically. Number of Addenda: 0

## 2018-06-23 NOTE — Anesthesia Preprocedure Evaluation (Signed)
Anesthesia Evaluation  Patient identified by MRN, date of birth, ID band Patient awake    Reviewed: Allergy & Precautions, NPO status , Patient's Chart, lab work & pertinent test results  History of Anesthesia Complications (+) PONV and history of anesthetic complications  Airway Mallampati: II  TM Distance: >3 FB Neck ROM: Full    Dental no notable dental hx.    Pulmonary neg pulmonary ROS,    Pulmonary exam normal breath sounds clear to auscultation       Cardiovascular hypertension, Pt. on medications Normal cardiovascular exam Rhythm:Regular Rate:Normal  ECG: SB, rate 57   Neuro/Psych PSYCHIATRIC DISORDERS Vertigo    GI/Hepatic Neg liver ROS, hiatal hernia, GERD  Medicated and Controlled,  Endo/Other  Breast cancer  Renal/GU negative Renal ROS     Musculoskeletal Chronic back pain   Abdominal   Peds  Hematology negative hematology ROS (+)   Anesthesia Other Findings family hx of colon cancer  Reproductive/Obstetrics                             Anesthesia Physical Anesthesia Plan  ASA: III  Anesthesia Plan: MAC   Post-op Pain Management:    Induction: Intravenous  PONV Risk Score and Plan: 3 and Propofol infusion, Treatment may vary due to age or medical condition and Ondansetron  Airway Management Planned: Natural Airway  Additional Equipment:   Intra-op Plan:   Post-operative Plan:   Informed Consent: I have reviewed the patients History and Physical, chart, labs and discussed the procedure including the risks, benefits and alternatives for the proposed anesthesia with the patient or authorized representative who has indicated his/her understanding and acceptance.   Dental advisory given  Plan Discussed with: CRNA  Anesthesia Plan Comments:         Anesthesia Quick Evaluation

## 2018-06-23 NOTE — Transfer of Care (Signed)
Immediate Anesthesia Transfer of Care Note  Patient: Melissa Mcclure  Procedure(s) Performed: COLONOSCOPY WITH PROPOFOL (N/A )  Patient Location: PACU  Anesthesia Type:MAC  Level of Consciousness: sedated  Airway & Oxygen Therapy: Patient Spontanous Breathing and Patient connected to face mask oxygen  Post-op Assessment: Report given to RN and Post -op Vital signs reviewed and stable  Post vital signs: Reviewed and stable  Last Vitals:  Vitals Value Taken Time  BP    Temp    Pulse 62 06/23/2018 12:11 PM  Resp 14 06/23/2018 12:11 PM  SpO2 100 % 06/23/2018 12:11 PM  Vitals shown include unvalidated device data.  Last Pain:  Vitals:   06/23/18 0915  TempSrc: Oral  PainSc: 0-No pain         Complications: No apparent anesthesia complications

## 2018-06-23 NOTE — Progress Notes (Signed)
Melissa Mcclure 10:03 AM  Subjective: Patient doing well and we discussed her one bout of chicken and rice dysphasia but no other GI complaints and she says the prep worked well  Objective: Vital signs stable afebrile exam please see previous assessment evaluation  Assessment: Family history of colon cancer due for colonic screening  Plan: Okay to proceed with colonoscopy with anesthesia assistance  [facility]  Pager [phone] After 5PM or if no answer call [phone]

## 2018-06-23 NOTE — Discharge Instructions (Signed)
YOU HAD AN ENDOSCOPIC PROCEDURE TODAY: Refer to the procedure report and other information in the discharge instructions given to you for any specific questions about what was found during the examination. If this information does not answer your questions, please call [facility] office at [phone] to clarify.   YOU SHOULD EXPECT: Some feelings of bloating in the abdomen. Passage of more gas than usual. Walking can help get rid of the air that was put into your GI tract during the procedure and reduce the bloating. If you had a lower endoscopy (such as a colonoscopy or flexible sigmoidoscopy) you may notice spotting of blood in your stool or on the toilet paper. Some abdominal soreness may be present for a day or two, also.  DIET: Your first meal following the procedure should be a light meal and then it is ok to progress to your normal diet. A half-sandwich or bowl of soup is an example of a good first meal. Heavy or fried foods are harder to digest and may make you feel nauseous or bloated. Drink plenty of fluids but you should avoid alcoholic beverages for 24 hours. If you had a esophageal dilation, please see attached instructions for diet.   ACTIVITY: Your care partner should take you home directly after the procedure. You should plan to take it easy, moving slowly for the rest of the day. You can resume normal activity the day after the procedure however YOU SHOULD NOT DRIVE, use power tools, machinery or perform tasks that involve climbing or major physical exertion for 24 hours (because of the sedation medicines used during the test).   SYMPTOMS TO REPORT IMMEDIATELY: A gastroenterologist can be reached at any hour. Please call [phone]  for any of the following symptoms:   Following lower endoscopy (colonoscopy, flexible sigmoidoscopy) Excessive amounts of blood in the stool  Significant tenderness, worsening of abdominal pains  Swelling of the abdomen that is new, acute  Fever of 100  or higher   Following upper endoscopy (EGD, EUS, ERCP, esophageal dilation) Vomiting of blood or coffee ground material  New, significant abdominal pain  New, significant chest pain or pain under the shoulder blades  Painful or persistently difficult swallowing  New shortness of breath  Black, tarry-looking or red, bloody stools  FOLLOW UP:  If any biopsies were taken you will be contacted by phone or by letter within the next 1-3 weeks. Call [phone]  if you have not heard about the biopsies in 3 weeks.  Please also call with any specific questions about appointments or follow up tests. Call if question or problem and follow-up as needed otherwise repeat colon screening in 5 years

## 2018-07-03 DIAGNOSIS — Z800 Family history of malignant neoplasm of digestive organs: Secondary | ICD-10-CM | POA: Diagnosis not present

## 2018-07-03 DIAGNOSIS — Z85820 Personal history of malignant melanoma of skin: Secondary | ICD-10-CM | POA: Diagnosis not present

## 2018-07-03 DIAGNOSIS — C50311 Malignant neoplasm of lower-inner quadrant of right female breast: Secondary | ICD-10-CM | POA: Diagnosis not present

## 2018-07-03 DIAGNOSIS — K4090 Unilateral inguinal hernia, without obstruction or gangrene, not specified as recurrent: Secondary | ICD-10-CM | POA: Diagnosis not present

## 2018-08-05 ENCOUNTER — Other Ambulatory Visit: Payer: Self-pay | Admitting: Neurosurgery

## 2018-08-05 DIAGNOSIS — M5415 Radiculopathy, thoracolumbar region: Secondary | ICD-10-CM

## 2018-08-08 DIAGNOSIS — K4090 Unilateral inguinal hernia, without obstruction or gangrene, not specified as recurrent: Secondary | ICD-10-CM | POA: Diagnosis not present

## 2018-08-17 ENCOUNTER — Ambulatory Visit
Admission: RE | Admit: 2018-08-17 | Discharge: 2018-08-17 | Disposition: A | Discharge status: Home / Self Care | Payer: BLUE CROSS/BLUE SHIELD | Source: Ambulatory Visit | Attending: Neurosurgery | Admitting: Neurosurgery

## 2018-08-17 DIAGNOSIS — M47816 Spondylosis without myelopathy or radiculopathy, lumbar region: Secondary | ICD-10-CM | POA: Diagnosis not present

## 2018-08-17 DIAGNOSIS — M5124 Other intervertebral disc displacement, thoracic region: Secondary | ICD-10-CM | POA: Diagnosis not present

## 2018-08-17 DIAGNOSIS — M5415 Radiculopathy, thoracolumbar region: Secondary | ICD-10-CM

## 2018-08-17 DIAGNOSIS — M47814 Spondylosis without myelopathy or radiculopathy, thoracic region: Secondary | ICD-10-CM | POA: Diagnosis not present

## 2018-08-17 DIAGNOSIS — M5126 Other intervertebral disc displacement, lumbar region: Secondary | ICD-10-CM | POA: Diagnosis not present

## 2018-08-17 MED ORDER — GADOBENATE DIMEGLUMINE 529 MG/ML IV SOLN
11.0000 mL | Freq: Once | INTRAVENOUS | Status: AC | PRN
  Administered 2018-08-17: 11 mL via INTRAVENOUS

## 2018-08-18 DIAGNOSIS — M65332 Trigger finger, left middle finger: Secondary | ICD-10-CM | POA: Diagnosis not present

## 2018-08-18 DIAGNOSIS — M1811 Unilateral primary osteoarthritis of first carpometacarpal joint, right hand: Secondary | ICD-10-CM | POA: Diagnosis not present

## 2018-08-26 DIAGNOSIS — H40033 Anatomical narrow angle, bilateral: Secondary | ICD-10-CM | POA: Diagnosis not present

## 2018-08-26 DIAGNOSIS — H2513 Age-related nuclear cataract, bilateral: Secondary | ICD-10-CM | POA: Diagnosis not present

## 2018-08-26 DIAGNOSIS — H40013 Open angle with borderline findings, low risk, bilateral: Secondary | ICD-10-CM | POA: Diagnosis not present

## 2018-08-26 DIAGNOSIS — H25013 Cortical age-related cataract, bilateral: Secondary | ICD-10-CM | POA: Diagnosis not present

## 2018-09-03 DIAGNOSIS — M4316 Spondylolisthesis, lumbar region: Secondary | ICD-10-CM | POA: Diagnosis not present

## 2018-09-03 DIAGNOSIS — M419 Scoliosis, unspecified: Secondary | ICD-10-CM | POA: Diagnosis not present

## 2018-09-03 DIAGNOSIS — M5415 Radiculopathy, thoracolumbar region: Secondary | ICD-10-CM | POA: Diagnosis not present

## 2018-09-03 DIAGNOSIS — M545 Low back pain: Secondary | ICD-10-CM | POA: Diagnosis not present

## 2018-09-09 DIAGNOSIS — Z08 Encounter for follow-up examination after completed treatment for malignant neoplasm: Secondary | ICD-10-CM | POA: Diagnosis not present

## 2018-09-09 DIAGNOSIS — L821 Other seborrheic keratosis: Secondary | ICD-10-CM | POA: Diagnosis not present

## 2018-09-09 DIAGNOSIS — L570 Actinic keratosis: Secondary | ICD-10-CM | POA: Diagnosis not present

## 2018-09-09 DIAGNOSIS — Z1283 Encounter for screening for malignant neoplasm of skin: Secondary | ICD-10-CM | POA: Diagnosis not present

## 2018-09-09 DIAGNOSIS — D2271 Melanocytic nevi of right lower limb, including hip: Secondary | ICD-10-CM | POA: Diagnosis not present

## 2018-09-09 DIAGNOSIS — Z85820 Personal history of malignant melanoma of skin: Secondary | ICD-10-CM | POA: Diagnosis not present

## 2018-09-09 DIAGNOSIS — X32XXXD Exposure to sunlight, subsequent encounter: Secondary | ICD-10-CM | POA: Diagnosis not present

## 2018-09-09 DIAGNOSIS — D485 Neoplasm of uncertain behavior of skin: Secondary | ICD-10-CM | POA: Diagnosis not present

## 2018-10-02 ENCOUNTER — Telehealth: Payer: Self-pay

## 2018-10-02 NOTE — Telephone Encounter (Signed)
Faxed signed order for diagnostic mammogram to [facility], sent to HIM for scanning to chart.

## 2018-11-05 ENCOUNTER — Encounter: Payer: Self-pay | Admitting: Hematology

## 2018-11-05 DIAGNOSIS — R921 Mammographic calcification found on diagnostic imaging of breast: Secondary | ICD-10-CM | POA: Diagnosis not present

## 2018-11-05 DIAGNOSIS — Z853 Personal history of malignant neoplasm of breast: Secondary | ICD-10-CM | POA: Diagnosis not present

## 2018-11-12 NOTE — Progress Notes (Signed)
[place]   [phone] [phone]   Clinic Follow up Note   Patient Care Team: Maury Dus, MD as PCP - General (Family Medicine)  Date of Service:  11/14/2018  CHIEF COMPLAINT: F/u of right breast DCIS   SUMMARY OF ONCOLOGIC HISTORY: Oncology History   Cancer Staging Ductal carcinoma in situ (DCIS) of right breast Staging form: Breast, AJCC 8th Edition - Clinical stage from 11/19/2017: Stage 0 (cTis (DCIS), cN0, cM0, ER: Positive, PR: Positive, HER2: Not assessed ) - Signed by Truitt Merle, MD on 11/26/2017       Ductal carcinoma in situ (DCIS) of right breast   11/07/2017 Mammogram     Diagnostic Mammogram Right breast 11/07/17 IMPRESSION:  The results of the exam were reviewed with the patient.  The 3 cm are of grouped branching linear calcifications in the right breast lower inner quadrant middle depth are at moderate concern but not classic for malignancy.  A Stereotactic biopsy is recommended.    11/19/2017 Initial Biopsy    Diagnosis 11/19/17 Breast, right, needle core biopsy - DUCTAL CARCINOMA IN SITU WITH CALCIFICATIONS. - SEE COMMENT.     11/19/2017 Receptors her2    Estrogen Receptor: 90%, POSITIVE, STRONG STAINING INTENSITY Progesterone Receptor: 25%, POSITIVE, STRONG STAINING INTENSITY     11/19/2017 Initial Diagnosis    Ductal carcinoma in situ (DCIS) of right breast    12/23/2017 Genetic Testing    Testing did not reveal a pathogenic mutation in any of the genes analyzed.A copy of the genetic test report will be scanned into Epic under the Media tab.  The genes analyzed were the 83 genes on [facility]'s Multi-Cancer panel (ALK, APC, ATM, AXIN2, BAP1, BARD1, BLM, BMPR1A, BRCA1, BRCA2, BRIP1, CASR, CDC73, CDH1, CDK4, CDKN1B, CDKN1C, CDKN2A, CEBPA, CHEK2, CTNNA1, DICER1, DIS3L2, EGFR, EPCAM, FH, FLCN, GATA2, GPC3, GREM1, HOXB13, HRAS, KIT, MAX, MEN1, MET, MITF, MLH1, MSH2, MSH3, MSH6, MUTYH, NBN, NF1, NF2, NTHL1, PALB2, PDGFRA,  PHOX2B, PMS2, POLD1, POLE, POT1, PRKAR1A, PTCH1, PTEN, RAD50, RAD51C, RAD51D, RB1, RECQL4, RET, RUNX1, SDHA, SDHAF2, SDHB, SDHC, SDHD, SMAD4, SMARCA4, SMARCB1, SMARCE1, STK11, SUFU, TERC, TERT, TMEM127, TP53, TSC1, TSC2, VHL, WRN, WT1).    12/25/2017 Pathology Results    Diagnosis, 12/25/2017 Breast, lumpectomy, Right w/seed - DUCTAL CARCINOMA IN SITU WITH CALCIFICATIONS, LOW GRADE, SPANNING 1.3 CM. - THE SURGICAL RESECTION MARGINS ARE NEGATIVE FOR CARCINOMA    01/20/2018 - 02/14/2018 Radiation Therapy    Radiation treatment dates:   01/20/2018 - 02/14/2018  Site/dose:   The patient initially received a dose of 42.5 Gy in 17 fractions to the right breast using whole-breast tangent fields. This was delivered using a 3-D conformal technique. The patient then received a boost to the seroma. This delivered an additional 7.5 Gy in 3 fractions using a 3 field photon technique due to the depth of the seroma. The total dose was 50 Gy.  Narrative: The patient tolerated radiation treatment relatively well.   The patient had some expected skin irritation as she progressed during treatment. Moist desquamation was not present at the end of treatment.     02/2018 -  Anti-estrogen oral therapy    Raloxifene 33m once daily starting in 02/2018      CURRENT THERAPY:  Raloxifene 628monce daily starting in 02/2018  INTERVAL HISTORY:  [name] here for a follow up of right breast DCIS. She was last seen by me 6 months ago. She presents to the clinic today by herself. She notes she  is doing well overall. She notes hot flashes from Raloxifene but this is manageable.  She notes joint pain in her hands and neck. She has gained weight since last visit. She had a DEXA in 10/2017. She had a hernia repair in 07/2018.     REVIEW OF SYSTEMS:   Constitutional: Denies fevers, chills (+) Weight gain (+) Manageable hot flashes  Eyes: Denies blurriness of vision Ears, nose, mouth, throat, and face: Denies mucositis  or sore throat Respiratory: Denies cough, dyspnea or wheezes Cardiovascular: Denies palpitation, chest discomfort or lower extremity swelling Gastrointestinal:  Denies nausea, heartburn or change in bowel habits Skin: Denies abnormal skin rashes MSK: (+) Joint pain mostly in hands and neck  Lymphatics: Denies new lymphadenopathy or easy bruising Neurological:Denies numbness, tingling or new weaknesses Behavioral/Psych: Mood is stable, no new changes  All other systems were reviewed with the patient and are negative.  MEDICAL HISTORY:  Past Medical History:  Diagnosis Date  . Acid reflux    GERD  . Cancer ([place]) 08,11   melanoma-left knee, right arm  . Chronic back pain   . Complication of anesthesia   . Ductal carcinoma in situ (DCIS) of right breast 10/2017  . Family history of adverse reaction to anesthesia    mother has PONV  . Family history of colon cancer   . Family history of melanoma   . Family history of ovarian cancer   . Genetic testing 12/23/2017   STAT Breast panel with reflex to Multi-Cancer panel (83 genes) @ [facility] - No pathogenic mutations detected  . GERD (gastroesophageal reflux disease)   . H/O hiatal hernia   . Heart murmur    no problems  . Hypertension   . PONV (postoperative nausea and vomiting)    "zofran works great  . Seasonal allergies   . SVD (spontaneous vaginal delivery)    x 1  . Vertigo     SURGICAL HISTORY: Past Surgical History:  Procedure Laterality Date  . ANTERIOR LAT LUMBAR FUSION Right 04/17/2013   Procedure: ANTERIOR LATERAL LUMBAR FUSION 3 LEVELS;  Surgeon: Erline Levine, MD;  Location: [facility];  Service: Neurosurgery;  Laterality: Right;  Right Lumbar Two-Three Lumbar Three-Four Lumbar Four-Five  Anterolateral Fusion  . BREAST LUMPECTOMY WITH RADIOACTIVE SEED LOCALIZATION Right 12/25/2017   Procedure: RIGHT BREAST LUMPECTOMY WITH RADIOACTIVE SEED LOCALIZATION;  Surgeon: Fanny Skates, MD;  Location: [facility];  Service: General;  Laterality: Right;  . [place]  . CESAREAN SECTION  1980  . COLONOSCOPY    . COLONOSCOPY WITH PROPOFOL N/A 06/23/2018   Procedure: COLONOSCOPY WITH PROPOFOL;  Surgeon: Clarene Essex, MD;  Location: [facility];  Service: Endoscopy;  Laterality: N/A;  . CYSTOSCOPY N/A 06/26/2016   Procedure: CYSTOSCOPY;  Surgeon: Megan Salon, MD;  Location: [facility];  Service: Gynecology;  Laterality: N/A;  . DILATATION & CURETTAGE/HYSTEROSCOPY WITH MYOSURE N/A 01/24/2016   Procedure: DILATATION & CURETTAGE/HYSTEROSCOPY WITH MYOSURE;  Surgeon: Megan Salon, MD;  Location: [facility];  Service: Gynecology;  Laterality: N/A;  . FOOT SURGERY Right    early 20s-bone spur removal  . LAPAROSCOPIC HYSTERECTOMY Bilateral 06/26/2016   Procedure: HYSTERECTOMY TOTAL LAPAROSCOPIC;  Surgeon: Megan Salon, MD;  Location: [facility];  Service: Gynecology;  Laterality: Bilateral;  . LUMBAR PERCUTANEOUS PEDICLE SCREW 3 LEVEL N/A 04/17/2013   Procedure: LUMBAR PERCUTANEOUS PEDICLE SCREW 3 LEVEL;  Surgeon: Erline Levine, MD;  Location: [facility];  Service: Neurosurgery;  Laterality: N/A;  Decompression and Posterior Lumbar Interbody Fusion of Lumbar Five-Sacral One, Percutaneous Screws Lumbar Two through Sacral One  . MELANOMA EXCISION     knee 08 lft. elbow 11rt  . SALPINGOOPHORECTOMY Bilateral 06/26/2016   Procedure: SALPINGO OOPHORECTOMY;  Surgeon: Megan Salon, MD;  Location: [facility];  Service: Gynecology;  Laterality: Bilateral;  . SHOULDER SURGERY Right 04/28/2015  . TUBAL LIGATION  1986  . WLE  06/17/07   and left groin lymph node    I have reviewed the social history and family history with the patient and they are unchanged from previous note.  ALLERGIES:  is allergic to sulfur.  MEDICATIONS:  Current Outpatient Medications  Medication Sig Dispense Refill  . acetaminophen (TYLENOL) 500 MG tablet Take 1,000 mg by mouth 2 (two) times daily as needed for mild pain. Reported on 01/23/2016     . Ascorbic Acid (VITAMIN C WITH ROSE HIPS) 1000 MG tablet Take 1,000 mg by mouth daily. Reported on 01/23/2016    . Calcium-Vitamin D-Vitamin K (VIACTIV CALCIUM PLUS D) 650-12.5-40 MG-MCG-MCG CHEW Chew 1 tablet by mouth daily.    . cetirizine (ZYRTEC) 10 MG tablet Take 10 mg by mouth daily. Reported on 01/23/2016    . diclofenac (VOLTAREN) 75 MG EC tablet Take 75 mg by mouth daily.    Marland Kitchen FIBER ADULT GUMMIES PO Take 2 tablets by mouth daily.    Marland Kitchen HYDROcodone-acetaminophen (NORCO) 5-325 MG tablet Take 1-2 tablets by mouth every 6 (six) hours as needed for moderate pain or severe pain. 20 tablet 0  . ibuprofen (ADVIL,MOTRIN) 200 MG tablet Take 400 mg by mouth daily as needed (for pain.).    Marland Kitchen Lactobacillus Rhamnosus, GG, (CULTURELLE PO) Take 1 capsule by mouth at bedtime.     Marland Kitchen losartan-hydrochlorothiazide (HYZAAR) 100-12.5 MG per tablet Take 1 tablet by mouth daily. Reported on 01/23/2016    . meclizine (ANTIVERT) 25 MG tablet Take 25 mg by mouth 3 (three) times daily as needed (vertigo). Reported on 01/23/2016    . methocarbamol (ROBAXIN) 500 MG tablet Take 500 mg by mouth 3 (three) times daily as needed for spasms.  0  . Multiple Vitamins-Minerals (ALIVE WOMENS 50+) TABS Take 1 tablet by mouth daily.    . ondansetron (ZOFRAN ODT) 4 MG disintegrating tablet 29m ODT q4 hours prn nausea/vomit (Patient taking differently: Take 4 mg by mouth every 4 (four) hours as needed (vertigo). ) 10 tablet 0  . pantoprazole (PROTONIX) 40 MG tablet Take 40 mg by mouth daily. Reported on 01/23/2016    . polyethylene glycol (MIRALAX / GLYCOLAX) packet Take 17 g by mouth at bedtime. Reported on 01/23/2016    . raloxifene (EVISTA) 60 MG tablet Take 1 tablet (60 mg total) by mouth daily. 90 tablet 3  . traMADol (ULTRAM) 50 MG tablet Take 50 mg by mouth 2 (two) times daily as needed (for pain).     .Marland Kitchenvenlafaxine XR (EFFEXOR-XR) 75 MG 24 hr capsule Take 1 capsule (75 mg total) by mouth daily with breakfast. 90 capsule 3   No current  facility-administered medications for this visit.     PHYSICAL EXAMINATION: ECOG PERFORMANCE STATUS: 0 - Asymptomatic  Vitals:   11/14/18 1035  BP: 132/85  Pulse: 68  Resp: 18  Temp: 98.1 F (36.7 C)  SpO2: 98%   Filed Weights   11/14/18 1035  Weight: 132 lb 11.2 oz (60.2 kg)    GENERAL:alert, no distress and comfortable SKIN: skin color, texture, turgor are normal, no rashes or  significant lesions EYES: normal, Conjunctiva are pink and non-injected, sclera clear OROPHARYNX:no exudate, no erythema and lips, buccal mucosa, and tongue normal  NECK: supple, thyroid normal size, non-tender, without nodularity LYMPH:  no palpable lymphadenopathy in the cervical, axillary or inguinal LUNGS: clear to auscultation and percussion with normal breathing effort HEART: regular rate & rhythm and no murmurs and no lower extremity edema ABDOMEN:abdomen soft, non-tender and normal bowel sounds Musculoskeletal:no cyanosis of digits and no clubbing  NEURO: alert & oriented x 3 with fluent speech, no focal motor/sensory deficits BREAST: S/p right breast lumpectomy: Surgical incision healed very well. (+) Left breast lumpy breast tissue with 1.5-2cm lump at 2:00 position, mild tenderness (+) No palpable mass or adenopathy in right breast   LABORATORY DATA:  I have reviewed the data as listed CBC Latest Ref Rng & Units 11/14/2018 05/16/2018 11/27/2017  WBC 4.0 - 10.5 K/uL 4.2 4.6 6.8  Hemoglobin 12.0 - 15.0 g/dL 13.2 12.6 12.7  Hematocrit 36.0 - 46.0 % 39.1 36.8 37.3  Platelets 150 - 400 K/uL 245 233 330     CMP Latest Ref Rng & Units 11/14/2018 05/16/2018 11/27/2017  Glucose 70 - 99 mg/dL 82 85 75  BUN 8 - 23 mg/dL _0 Creatinine 0.44 - 1.00 mg/dL 0.87 0.83 0.85  Sodium 135 - 145 mmol/L 142 138 139  Potassium 3.5 - 5.1 mmol/L 4.1 4.2 3.8  Chloride 98 - 111 mmol/L 102 100 101  CO2 22 - 32 mmol/L _1 Calcium 8.9 - 10.3 mg/dL 9.5 9.8 10.0  Total Protein 6.5 - 8.1 g/dL 6.8 6.6 7.3    Total Bilirubin 0.3 - 1.2 mg/dL 0.4 0.3 0.4  Alkaline Phos 38 - 126 U/L 70 64 76  AST 15 - 41 U/L _2 ALT 0 - 44 U/L _3 RADIOGRAPHIC STUDIES: I have personally reviewed the radiological images as listed and agreed with the findings in the report. No results found.   ASSESSMENT & PLAN:  Melissa Mcclure is a 63 y.o. female with   1. Ductal carcinoma in situ (DCIS) of right breast, ER/PR Positive, Grade 1 -She was diagnosed in 10/2017. She is s/p right breast lumpectomy and adjuvant radiation.  -She started anti-estrogen therapy with Raloxifene in 02/2018. She is tolerating well with manageable hot flashes  -She is fine to use Tumeric supplement.  -I disucssed her Raloxifene can lead to weight gain, I encouraged her to watch her weight and encouraged a healthy diet and to remain active.  -She is clinically doing well. Lab reviewed, her CBC and CMP are within normal limits. Her 10/2018 mammogram was unremarkable. Her exam today shows lumpiness of left breast tissue with a palpable 1-2cm lump at 2:00 position. Her mammogram on 11/05/2018 was negative. Given dense breast tissue, I will get left breast ultrasound to further evaluate.  -Continue Raloxifene.  -F/u in 6 months    2. Genetic Counseling was negative forpathogenic mutation in any of the genes analyzed.   3. HTN, history of skin melanoma -Follow-up with PCP   4. Hot Flashes  -Gabapentin did not work well enough for her.  -Her hot flashes are overall controlled with Effexor 74m daily, will continue.  -Stable.   5. Bone health  [facility] with lowest T-Score of -0.4 at left femur neck.  -I disucssed Raloxifene helps strengthen her bone density.  -Will repeat every 2 years, next in  10/2019   PLAN:  -Continue Raloxifene  -Left Breast US to evaluate a left breast lump at 2:00 position  -Lab and f/u in 6 months     No problem-specific Assessment & Plan notes found for this  encounter.   Orders Placed This Encounter  Procedures  . US BREAST LTD UNI LEFT INC AXILLA    Standing Status:   Future    Standing Expiration Date:   01/13/2020    Scheduling Instructions:     [facility]    Order Specific Question:   Reason for Exam (SYMPTOM  OR DIAGNOSIS REQUIRED)    Answer:   left breast 1.5cm mass at 2:00 position, rule our malignancy    Order Specific Question:   Preferred imaging location?    Answer:   External   All questions were answered. The patient knows to call the clinic with any problems, questions or concerns. No barriers to learning was detected. I spent 20 minutes counseling the patient face to face. The total time spent in the appointment was 25 minutes and more than 50% was on counseling and review of test results     Truitt Merle, MD 11/14/2018   I, Joslyn Devon, am acting as scribe for Truitt Merle, MD.   I have reviewed the above documentation for accuracy and completeness, and I agree with the above.

## 2018-11-14 ENCOUNTER — Telehealth: Payer: Self-pay | Admitting: Hematology

## 2018-11-14 ENCOUNTER — Encounter: Payer: Self-pay | Admitting: Hematology

## 2018-11-14 ENCOUNTER — Inpatient Hospital Stay (HOSPITAL_BASED_OUTPATIENT_CLINIC_OR_DEPARTMENT_OTHER): Payer: BLUE CROSS/BLUE SHIELD | Admitting: Hematology

## 2018-11-14 ENCOUNTER — Inpatient Hospital Stay: Payer: BLUE CROSS/BLUE SHIELD | Attending: Family Medicine

## 2018-11-14 VITALS — BP 132/85 | HR 68 | Temp 98.1°F | Resp 18 | Ht 62.5 in | Wt 132.7 lb

## 2018-11-14 DIAGNOSIS — Z85820 Personal history of malignant melanoma of skin: Secondary | ICD-10-CM

## 2018-11-14 DIAGNOSIS — Z170 Estrogen receptor positive status [ER+]: Secondary | ICD-10-CM | POA: Diagnosis not present

## 2018-11-14 DIAGNOSIS — D0511 Intraductal carcinoma in situ of right breast: Secondary | ICD-10-CM

## 2018-11-14 DIAGNOSIS — Z79811 Long term (current) use of aromatase inhibitors: Secondary | ICD-10-CM

## 2018-11-14 DIAGNOSIS — I10 Essential (primary) hypertension: Secondary | ICD-10-CM

## 2018-11-14 DIAGNOSIS — Z79899 Other long term (current) drug therapy: Secondary | ICD-10-CM | POA: Diagnosis not present

## 2018-11-14 DIAGNOSIS — Z923 Personal history of irradiation: Secondary | ICD-10-CM

## 2018-11-14 LAB — CMP (CANCER CENTER ONLY)
ALT: 26 U/L (ref 0–44)
AST: 28 U/L (ref 15–41)
Albumin: 4 g/dL (ref 3.5–5.0)
Alkaline Phosphatase: 70 U/L (ref 38–126)
Anion gap: 11 (ref 5–15)
BUN: 19 mg/dL (ref 8–23)
CHLORIDE: 102 mmol/L (ref 98–111)
CO2: 29 mmol/L (ref 22–32)
Calcium: 9.5 mg/dL (ref 8.9–10.3)
Creatinine: 0.87 mg/dL (ref 0.44–1.00)
Glucose, Bld: 82 mg/dL (ref 70–99)
POTASSIUM: 4.1 mmol/L (ref 3.5–5.1)
Sodium: 142 mmol/L (ref 135–145)
Total Bilirubin: 0.4 mg/dL (ref 0.3–1.2)
Total Protein: 6.8 g/dL (ref 6.5–8.1)

## 2018-11-14 LAB — CBC WITH DIFFERENTIAL (CANCER CENTER ONLY)
ABS IMMATURE GRANULOCYTES: 0.01 10*3/uL (ref 0.00–0.07)
BASOS ABS: 0.1 10*3/uL (ref 0.0–0.1)
Basophils Relative: 1 %
Eosinophils Absolute: 0.2 10*3/uL (ref 0.0–0.5)
Eosinophils Relative: 5 %
HCT: 39.1 % (ref 36.0–46.0)
HEMOGLOBIN: 13.2 g/dL (ref 12.0–15.0)
IMMATURE GRANULOCYTES: 0 %
LYMPHS PCT: 30 %
Lymphs Abs: 1.3 10*3/uL (ref 0.7–4.0)
MCH: 32 pg (ref 26.0–34.0)
MCHC: 33.8 g/dL (ref 30.0–36.0)
MCV: 94.7 fL (ref 80.0–100.0)
Monocytes Absolute: 0.4 10*3/uL (ref 0.1–1.0)
Monocytes Relative: 10 %
NEUTROS ABS: 2.3 10*3/uL (ref 1.7–7.7)
NEUTROS PCT: 54 %
NRBC: 0 % (ref 0.0–0.2)
Platelet Count: 245 10*3/uL (ref 150–400)
RBC: 4.13 MIL/uL (ref 3.87–5.11)
RDW: 12.1 % (ref 11.5–15.5)
WBC Count: 4.2 10*3/uL (ref 4.0–10.5)

## 2018-11-14 NOTE — Telephone Encounter (Signed)
Scheduled appt per 2/21 los.  Printed calendar and avs.

## 2018-11-20 ENCOUNTER — Encounter: Payer: Self-pay | Admitting: Hematology

## 2018-11-20 ENCOUNTER — Telehealth: Payer: Self-pay

## 2018-11-20 DIAGNOSIS — N6321 Unspecified lump in the left breast, upper outer quadrant: Secondary | ICD-10-CM | POA: Diagnosis not present

## 2018-11-20 NOTE — Telephone Encounter (Signed)
Faxed order for breast ultrasound to [facility], sent to HIM for scanning to chart.

## 2018-11-21 ENCOUNTER — Telehealth: Payer: Self-pay

## 2018-11-21 NOTE — Telephone Encounter (Signed)
Faxed signed order for breast biopsy back to [facility], sent to HIM for scanning to chart.

## 2018-11-25 ENCOUNTER — Telehealth: Payer: Self-pay | Admitting: Licensed Clinical Social Worker

## 2018-11-25 NOTE — Telephone Encounter (Signed)
Melissa Mcclure called with questions about a bill she received from [facility]. She says she already received one bill back in 2019 when she had her genetic testing done and is unsure why she has received another bill now. I gave her [facility]'s number to call and ask for their billing department. I told her she can call us back if she is unable to get ahold of them/get answers.

## 2018-12-01 ENCOUNTER — Encounter: Payer: Self-pay | Admitting: Hematology

## 2018-12-01 ENCOUNTER — Other Ambulatory Visit: Payer: Self-pay | Admitting: Radiology

## 2018-12-01 DIAGNOSIS — N6032 Fibrosclerosis of left breast: Secondary | ICD-10-CM | POA: Diagnosis not present

## 2018-12-01 DIAGNOSIS — N6321 Unspecified lump in the left breast, upper outer quadrant: Secondary | ICD-10-CM | POA: Diagnosis not present

## 2019-01-05 DIAGNOSIS — M545 Low back pain: Secondary | ICD-10-CM | POA: Diagnosis not present

## 2019-01-05 DIAGNOSIS — M4316 Spondylolisthesis, lumbar region: Secondary | ICD-10-CM | POA: Diagnosis not present

## 2019-01-05 DIAGNOSIS — M542 Cervicalgia: Secondary | ICD-10-CM | POA: Diagnosis not present

## 2019-01-05 DIAGNOSIS — M5415 Radiculopathy, thoracolumbar region: Secondary | ICD-10-CM | POA: Diagnosis not present

## 2019-02-03 ENCOUNTER — Other Ambulatory Visit: Payer: Self-pay | Admitting: Neurosurgery

## 2019-02-03 DIAGNOSIS — M5412 Radiculopathy, cervical region: Secondary | ICD-10-CM

## 2019-02-04 ENCOUNTER — Ambulatory Visit
Admission: RE | Admit: 2019-02-04 | Discharge: 2019-02-04 | Disposition: A | Discharge status: Home / Self Care | Payer: BLUE CROSS/BLUE SHIELD | Source: Ambulatory Visit | Attending: Neurosurgery | Admitting: Neurosurgery

## 2019-02-04 ENCOUNTER — Other Ambulatory Visit: Payer: Self-pay

## 2019-02-04 DIAGNOSIS — M5030 Other cervical disc degeneration, unspecified cervical region: Secondary | ICD-10-CM | POA: Diagnosis not present

## 2019-02-04 DIAGNOSIS — M5412 Radiculopathy, cervical region: Secondary | ICD-10-CM

## 2019-02-09 DIAGNOSIS — M419 Scoliosis, unspecified: Secondary | ICD-10-CM | POA: Diagnosis not present

## 2019-02-09 DIAGNOSIS — M5412 Radiculopathy, cervical region: Secondary | ICD-10-CM | POA: Diagnosis not present

## 2019-02-09 DIAGNOSIS — M542 Cervicalgia: Secondary | ICD-10-CM | POA: Diagnosis not present

## 2019-02-11 DIAGNOSIS — R42 Dizziness and giddiness: Secondary | ICD-10-CM | POA: Diagnosis not present

## 2019-02-11 DIAGNOSIS — I10 Essential (primary) hypertension: Secondary | ICD-10-CM | POA: Diagnosis not present

## 2019-02-11 DIAGNOSIS — Z0000 Encounter for general adult medical examination without abnormal findings: Secondary | ICD-10-CM | POA: Diagnosis not present

## 2019-02-11 DIAGNOSIS — G4700 Insomnia, unspecified: Secondary | ICD-10-CM | POA: Diagnosis not present

## 2019-02-12 DIAGNOSIS — M5412 Radiculopathy, cervical region: Secondary | ICD-10-CM | POA: Diagnosis not present

## 2019-02-12 DIAGNOSIS — M419 Scoliosis, unspecified: Secondary | ICD-10-CM | POA: Diagnosis not present

## 2019-02-12 DIAGNOSIS — M542 Cervicalgia: Secondary | ICD-10-CM | POA: Diagnosis not present

## 2019-02-12 DIAGNOSIS — M47812 Spondylosis without myelopathy or radiculopathy, cervical region: Secondary | ICD-10-CM | POA: Diagnosis not present

## 2019-02-18 DIAGNOSIS — M5412 Radiculopathy, cervical region: Secondary | ICD-10-CM | POA: Diagnosis not present

## 2019-02-25 DIAGNOSIS — I10 Essential (primary) hypertension: Secondary | ICD-10-CM | POA: Diagnosis not present

## 2019-02-27 DIAGNOSIS — R2689 Other abnormalities of gait and mobility: Secondary | ICD-10-CM | POA: Diagnosis not present

## 2019-03-04 DIAGNOSIS — M47812 Spondylosis without myelopathy or radiculopathy, cervical region: Secondary | ICD-10-CM | POA: Diagnosis not present

## 2019-03-04 DIAGNOSIS — M5412 Radiculopathy, cervical region: Secondary | ICD-10-CM | POA: Diagnosis not present

## 2019-03-04 DIAGNOSIS — M4802 Spinal stenosis, cervical region: Secondary | ICD-10-CM | POA: Diagnosis not present

## 2019-03-04 DIAGNOSIS — M542 Cervicalgia: Secondary | ICD-10-CM | POA: Diagnosis not present

## 2019-03-12 DIAGNOSIS — R2689 Other abnormalities of gait and mobility: Secondary | ICD-10-CM | POA: Diagnosis not present

## 2019-03-18 DIAGNOSIS — R2689 Other abnormalities of gait and mobility: Secondary | ICD-10-CM | POA: Diagnosis not present

## 2019-03-25 DIAGNOSIS — R2689 Other abnormalities of gait and mobility: Secondary | ICD-10-CM | POA: Diagnosis not present

## 2019-03-26 DIAGNOSIS — H5010 Unspecified exotropia: Secondary | ICD-10-CM | POA: Diagnosis not present

## 2019-04-13 DIAGNOSIS — R2689 Other abnormalities of gait and mobility: Secondary | ICD-10-CM | POA: Diagnosis not present

## 2019-04-15 ENCOUNTER — Other Ambulatory Visit: Payer: Self-pay

## 2019-04-17 ENCOUNTER — Encounter: Payer: Self-pay | Admitting: Obstetrics & Gynecology

## 2019-04-17 ENCOUNTER — Other Ambulatory Visit: Payer: Self-pay

## 2019-04-17 ENCOUNTER — Other Ambulatory Visit: Payer: Self-pay | Admitting: Hematology

## 2019-04-17 ENCOUNTER — Ambulatory Visit (INDEPENDENT_AMBULATORY_CARE_PROVIDER_SITE_OTHER): Payer: BC Managed Care – PPO | Admitting: Obstetrics & Gynecology

## 2019-04-17 ENCOUNTER — Telehealth: Payer: Self-pay

## 2019-04-17 VITALS — BP 120/88 | HR 76 | Temp 97.2°F | Ht 62.5 in | Wt 136.6 lb

## 2019-04-17 DIAGNOSIS — Z01419 Encounter for gynecological examination (general) (routine) without abnormal findings: Secondary | ICD-10-CM | POA: Diagnosis not present

## 2019-04-17 DIAGNOSIS — M1990 Unspecified osteoarthritis, unspecified site: Secondary | ICD-10-CM | POA: Diagnosis not present

## 2019-04-17 MED ORDER — VENLAFAXINE HCL ER 150 MG PO CP24: 150.0000 mg | ORAL_CAPSULE | Freq: Every day | ORAL | 0 refills | Status: DC

## 2019-04-17 NOTE — Telephone Encounter (Signed)
I called her back, and called in effexor 150mg  daily #30 for her, she will try 75mg  2 tabs daily first to see if it helps her hot flush and if she tolerates well. She will see me next month.   Truitt Merle MD

## 2019-04-17 NOTE — Telephone Encounter (Signed)
Patient calls stating that her hot flashes are much worse.  Wants to know if the dose of Effexor can be increased.  [phone]

## 2019-04-17 NOTE — Progress Notes (Signed)
63 y.o. G3P3 Married White or Caucasian female here for annual exam.  Doing well.  Having some arm numbness.  MRI of neck was done.  Had injection and this helped the arm.    Denies vaginal bleeding.    PCP:  Dr. Alyson Ingles.  Had appt in May that was a virtual visit.  Blood work was done.    Patient's last menstrual period was 06/24/2008.          Sexually active: Yes.    The current method of family planning is status post hysterectomy.    Exercising: No.   Smoker:  no  Health Maintenance: Pap:  12/30/15 Neg. HR HPV:neg   10/25/14 neg  History of abnormal Pap:  no MMG:  11/05/18 Diagnostic Bilat BIRADS2:benign. F/u 1 year   12/01/18 Left breast Bx: negative. Colonoscopy:  06/23/18 f/u 5 years BMD:   10/30/17 normal  TDaP:  PCP Pneumonia vaccine(s):  No Shingrix: No Hep C testing: Unsure  Screening Labs: PCP   reports that she has never smoked. She has never used smokeless tobacco. She reports current alcohol use. She reports that she does not use drugs.  Past Medical History:  Diagnosis Date  . Acid reflux    GERD  . Cancer ([place]) 08,11   melanoma-left knee, right arm  . Chronic back pain   . Complication of anesthesia   . Ductal carcinoma in situ (DCIS) of right breast 10/2017  . Family history of adverse reaction to anesthesia    mother has PONV  . Family history of colon cancer   . Family history of melanoma   . Family history of ovarian cancer   . Genetic testing 12/23/2017   STAT Breast panel with reflex to Multi-Cancer panel (83 genes) @ [facility] - No pathogenic mutations detected  . GERD (gastroesophageal reflux disease)   . H/O hiatal hernia   . Heart murmur    no problems  . Hypertension   . PONV (postoperative nausea and vomiting)    "zofran works great  . Seasonal allergies   . SVD (spontaneous vaginal delivery)    x 1  . Vertigo     Past Surgical History:  Procedure Laterality Date  . ANTERIOR LAT LUMBAR FUSION Right 04/17/2013   Procedure: ANTERIOR LATERAL  LUMBAR FUSION 3 LEVELS;  Surgeon: Erline Levine, MD;  Location: [place] [facility];  Service: Neurosurgery;  Laterality: Right;  Right Lumbar Two-Three Lumbar Three-Four Lumbar Four-Five  Anterolateral Fusion  . BREAST LUMPECTOMY WITH RADIOACTIVE SEED LOCALIZATION Right 12/25/2017   Procedure: RIGHT BREAST LUMPECTOMY WITH RADIOACTIVE SEED LOCALIZATION;  Surgeon: Fanny Skates, MD;  Location: [facility];  Service: General;  Laterality: Right;  . [place]  . CESAREAN SECTION  1980  . COLONOSCOPY    . COLONOSCOPY WITH PROPOFOL N/A 06/23/2018   Procedure: COLONOSCOPY WITH PROPOFOL;  Surgeon: Clarene Essex, MD;  Location: [facility];  Service: Endoscopy;  Laterality: N/A;  . CYSTOSCOPY N/A 06/26/2016   Procedure: CYSTOSCOPY;  Surgeon: Megan Salon, MD;  Location: [facility];  Service: Gynecology;  Laterality: N/A;  . DILATATION & CURETTAGE/HYSTEROSCOPY WITH MYOSURE N/A 01/24/2016   Procedure: DILATATION & CURETTAGE/HYSTEROSCOPY WITH MYOSURE;  Surgeon: Megan Salon, MD;  Location: [facility];  Service: Gynecology;  Laterality: N/A;  . FOOT SURGERY Right    early 20s-bone spur removal  . LAPAROSCOPIC HYSTERECTOMY Bilateral 06/26/2016   Procedure: HYSTERECTOMY TOTAL LAPAROSCOPIC;  Surgeon: Megan Salon, MD;  Location: [facility];  Service: Gynecology;  Laterality: Bilateral;  . LUMBAR PERCUTANEOUS PEDICLE SCREW 3 LEVEL N/A 04/17/2013   Procedure: LUMBAR PERCUTANEOUS PEDICLE SCREW 3 LEVEL;  Surgeon: Erline Levine, MD;  Location: [place] [facility];  Service: Neurosurgery;  Laterality: N/A;  Decompression and Posterior Lumbar Interbody Fusion of Lumbar Five-Sacral One, Percutaneous Screws Lumbar Two through Sacral One  . MELANOMA EXCISION     knee 08 lft. elbow 11rt  . SALPINGOOPHORECTOMY Bilateral 06/26/2016   Procedure: SALPINGO OOPHORECTOMY;  Surgeon: Megan Salon, MD;  Location: [facility];  Service: Gynecology;  Laterality: Bilateral;  . SHOULDER SURGERY Right 04/28/2015  . TUBAL LIGATION  1986  . WLE   06/17/07   and left groin lymph node    Current Outpatient Medications  Medication Sig Dispense Refill  . acetaminophen (TYLENOL) 500 MG tablet Take 1,000 mg by mouth 2 (two) times daily as needed for mild pain. Reported on 01/23/2016    . Ascorbic Acid (VITAMIN C WITH ROSE HIPS) 1000 MG tablet Take 1,000 mg by mouth daily. Reported on 01/23/2016    . Calcium-Vitamin D-Vitamin K (VIACTIV CALCIUM PLUS D) 650-12.5-40 MG-MCG-MCG CHEW Chew 1 tablet by mouth daily.    . cetirizine (ZYRTEC) 10 MG tablet Take 10 mg by mouth daily. Reported on 01/23/2016    . diclofenac (VOLTAREN) 75 MG EC tablet Take 75 mg by mouth daily.    Marland Kitchen FIBER ADULT GUMMIES PO Take 2 tablets by mouth daily.    Marland Kitchen HYDROcodone-acetaminophen (NORCO) 5-325 MG tablet Take 1-2 tablets by mouth every 6 (six) hours as needed for moderate pain or severe pain. 20 tablet 0  . ibuprofen (ADVIL,MOTRIN) 200 MG tablet Take 400 mg by mouth daily as needed (for pain.).    Marland Kitchen Lactobacillus Rhamnosus, GG, (CULTURELLE PO) Take 1 capsule by mouth at bedtime.     Marland Kitchen losartan-hydrochlorothiazide (HYZAAR) 100-12.5 MG per tablet Take 1 tablet by mouth daily. Reported on 01/23/2016    . meclizine (ANTIVERT) 25 MG tablet Take 25 mg by mouth 3 (three) times daily as needed (vertigo). Reported on 01/23/2016    . methocarbamol (ROBAXIN) 500 MG tablet Take 500 mg by mouth 3 (three) times daily as needed for spasms.  0  . Multiple Vitamins-Minerals (ALIVE WOMENS 50+) TABS Take 1 tablet by mouth daily.    . ondansetron (ZOFRAN ODT) 4 MG disintegrating tablet 4mg  ODT q4 hours prn nausea/vomit (Patient taking differently: Take 4 mg by mouth every 4 (four) hours as needed (vertigo). ) 10 tablet 0  . pantoprazole (PROTONIX) 40 MG tablet Take 40 mg by mouth daily. Reported on 01/23/2016    . polyethylene glycol (MIRALAX / GLYCOLAX) packet Take 17 g by mouth at bedtime. Reported on 01/23/2016    . raloxifene (EVISTA) 60 MG tablet Take 1 tablet (60 mg total) by mouth daily. 90 tablet 3   . traMADol (ULTRAM) 50 MG tablet Take 50 mg by mouth 2 (two) times daily as needed (for pain).     Marland Kitchen venlafaxine XR (EFFEXOR-XR) 75 MG 24 hr capsule Take 1 capsule (75 mg total) by mouth daily with breakfast. 90 capsule 3   No current facility-administered medications for this visit.     Family History  Problem Relation Age of Onset  . Diabetes Father        borderline  . Melanoma Father        dx 38s/60s; deceased 52s  . Congestive Heart Failure Father   . Colon cancer Mother        dx 42s; deceased  55  . Hypertension Mother   . Osteoporosis Mother   . Ovarian cancer Maternal Grandmother        deceased 40s  . Heart disease Paternal Grandfather   . Melanoma Daughter        on leg; dx 63s    Review of Systems  Genitourinary:       Incontinence   All other systems reviewed and are negative.   Exam:   BP 120/88   Pulse 76   Temp (!) 97.2 F (36.2 C) (Temporal)   Ht 5' 2.5" (1.588 m)   Wt 136 lb 9.6 oz (62 kg)   LMP 06/24/2008   BMI 24.59 kg/m     Height: 5' 2.5" (158.8 cm)  Ht Readings from Last 3 Encounters:  04/17/19 5' 2.5" (1.588 m)  11/14/18 5' 2.5" (1.588 m)  06/23/18 5' 2.5" (1.588 m)    General appearance: alert, cooperative and appears stated age Head: Normocephalic, without obvious abnormality, atraumatic Neck: no adenopathy, supple, symmetrical, trachea midline and thyroid normal to inspection and palpation Lungs: clear to auscultation bilaterally Breasts: normal appearance, no masses or tenderness, well healed scar on right breast Heart: regular rate and rhythm Abdomen: soft, non-tender; bowel sounds normal; no masses,  no organomegaly Extremities: extremities normal, atraumatic, no cyanosis or edema Skin: Skin color, texture, turgor normal. No rashes or lesions Lymph nodes: Cervical, supraclavicular, and axillary nodes normal. No abnormal inguinal nodes palpated Neurologic: Grossly normal   Pelvic: External genitalia:  no lesions               Urethra:  normal appearing urethra with no masses, tenderness or lesions              Bartholins and Skenes: normal                 Vagina: normal appearing vagina with normal color and discharge, no lesions, second degree cystocele              Cervix: absent              Pap taken: No. Bimanual Exam:  Uterus:  uterus absent              Adnexa: no mass, fullness, tenderness               Rectovaginal: Confirms               Anus:  normal sphincter tone, no lesions  Chaperone was present for exam.  A:  Well Woman with normal exam PMP, off HRT TLH/BSO, cystoscopy due to persistent PMP bleeding H/o vaginal cuff granulation tissue, resolved H/O melanoma, last appt was December Hypertension Joint/neck issues have increased this past year  P:   Mammogram guidelines reviewed. pap smear not indicated Colonoscopy and BMD are UTD Release of records for vaccines and lab work obtained today Rheumatoid factor obtained today Return annually or prn

## 2019-04-18 LAB — RHEUMATOID FACTOR: Rhuematoid fact SerPl-aCnc: <10 IU/mL (ref 0.0–13.9)

## 2019-04-30 DIAGNOSIS — R2689 Other abnormalities of gait and mobility: Secondary | ICD-10-CM | POA: Diagnosis not present

## 2019-05-11 DIAGNOSIS — R2689 Other abnormalities of gait and mobility: Secondary | ICD-10-CM | POA: Diagnosis not present

## 2019-05-14 NOTE — Progress Notes (Signed)
[facility]   [phone] [phone]   Clinic Follow up Note   Patient Care Team: Maury Dus, MD as PCP - General (Family Medicine)  Date of Service:  05/15/2019  CHIEF COMPLAINT: F/u of right breast DCIS   SUMMARY OF ONCOLOGIC HISTORY: Oncology History Overview Note  Cancer Staging Ductal carcinoma in situ (DCIS) of right breast Staging form: Breast, AJCC 8th Edition - Clinical stage from 11/19/2017: Stage 0 (cTis (DCIS), cN0, cM0, ER: Positive, PR: Positive, HER2: Not assessed ) - Signed by Truitt Merle, MD on 11/26/2017     Ductal carcinoma in situ (DCIS) of right breast  11/07/2017 Mammogram    Diagnostic Mammogram Right breast 11/07/17 IMPRESSION:  The results of the exam were reviewed with the patient.  The 3 cm are of grouped branching linear calcifications in the right breast lower inner quadrant middle depth are at moderate concern but not classic for malignancy.  A Stereotactic biopsy is recommended.   11/19/2017 Initial Biopsy   Diagnosis 11/19/17 Breast, right, needle core biopsy - DUCTAL CARCINOMA IN SITU WITH CALCIFICATIONS. - SEE COMMENT.    11/19/2017 Receptors her2   Estrogen Receptor: 90%, POSITIVE, STRONG STAINING INTENSITY Progesterone Receptor: 25%, POSITIVE, STRONG STAINING INTENSITY    11/19/2017 Initial Diagnosis   Ductal carcinoma in situ (DCIS) of right breast   12/23/2017 Genetic Testing   Testing did not reveal a pathogenic mutation in any of the genes analyzed.A copy of the genetic test report will be scanned into Epic under the Media tab.  The genes analyzed were the 83 genes on [facility]'s Multi-Cancer panel (ALK, APC, ATM, AXIN2, BAP1, BARD1, BLM, BMPR1A, BRCA1, BRCA2, BRIP1, CASR, CDC73, CDH1, CDK4, CDKN1B, CDKN1C, CDKN2A, CEBPA, CHEK2, CTNNA1, DICER1, DIS3L2, EGFR, EPCAM, FH, FLCN, GATA2, GPC3, GREM1, HOXB13, HRAS, KIT, MAX, MEN1, MET, MITF, MLH1, MSH2, MSH3, MSH6, MUTYH, NBN, NF1, NF2, NTHL1, PALB2, PDGFRA,  PHOX2B, PMS2, POLD1, POLE, POT1, PRKAR1A, PTCH1, PTEN, RAD50, RAD51C, RAD51D, RB1, RECQL4, RET, RUNX1, SDHA, SDHAF2, SDHB, SDHC, SDHD, SMAD4, SMARCA4, SMARCB1, SMARCE1, STK11, SUFU, TERC, TERT, TMEM127, TP53, TSC1, TSC2, VHL, WRN, WT1).   12/25/2017 Pathology Results   Diagnosis, 12/25/2017 Breast, lumpectomy, Right w/seed - DUCTAL CARCINOMA IN SITU WITH CALCIFICATIONS, LOW GRADE, SPANNING 1.3 CM. - THE SURGICAL RESECTION MARGINS ARE NEGATIVE FOR CARCINOMA   01/20/2018 - 02/14/2018 Radiation Therapy   Radiation treatment dates:   01/20/2018 - 02/14/2018  Site/dose:   The patient initially received a dose of 42.5 Gy in 17 fractions to the right breast using whole-breast tangent fields. This was delivered using a 3-D conformal technique. The patient then received a boost to the seroma. This delivered an additional 7.5 Gy in 3 fractions using a 3 field photon technique due to the depth of the seroma. The total dose was 50 Gy.  Narrative: The patient tolerated radiation treatment relatively well.   The patient had some expected skin irritation as she progressed during treatment. Moist desquamation was not present at the end of treatment.    02/2018 -  Anti-estrogen oral therapy   Raloxifene 39m once daily starting in 02/2018      CURRENT THERAPY:  Raloxifene 667monce daily starting in 02/2018  INTERVAL HISTORY:  LyMILLIANA REDDOCHs here for a follow up right breast DCIS. She was last seen by me 6 months ago. She presents to the clinic alone. She note her hot flashes has improved with increased Effexor. She notes arthritis in her hands, back and neck, but this has not  been effected her Raloxifene. She also notes right axillary or breast pain occasionally. Overall her symptoms are manageable.     REVIEW OF SYSTEMS:   Constitutional: Denies fevers, chills or abnormal weight loss (+) Improved hot flashes  Eyes: Denies blurriness of vision Ears, nose, mouth, throat, and face: Denies mucositis or sore  throat Respiratory: Denies cough, dyspnea or wheezes Cardiovascular: Denies palpitation, chest discomfort or lower extremity swelling Gastrointestinal:  Denies nausea, heartburn or change in bowel habits Skin: Denies abnormal skin rashes MSK: (+) Arthritis in back, hands and neck  Lymphatics: Denies new lymphadenopathy or easy bruising Neurological:Denies numbness, tingling or new weaknesses Behavioral/Psych: Mood is stable, no new changes  All other systems were reviewed with the patient and are negative.  MEDICAL HISTORY:  Past Medical History:  Diagnosis Date  . Acid reflux    GERD  . Cancer ([place]) 08,11   melanoma-left knee, right arm  . Chronic back pain   . Complication of anesthesia   . Ductal carcinoma in situ (DCIS) of right breast 10/2017  . Family history of adverse reaction to anesthesia    mother has PONV  . Family history of colon cancer   . Family history of melanoma   . Family history of ovarian cancer   . Genetic testing 12/23/2017   STAT Breast panel with reflex to Multi-Cancer panel (83 genes) @ [facility] - No pathogenic mutations detected  . GERD (gastroesophageal reflux disease)   . H/O hiatal hernia   . Heart murmur    no problems  . Hypertension   . PONV (postoperative nausea and vomiting)    "zofran works great  . Seasonal allergies   . SVD (spontaneous vaginal delivery)    x 1  . Vertigo     SURGICAL HISTORY: Past Surgical History:  Procedure Laterality Date  . ANTERIOR LAT LUMBAR FUSION Right 04/17/2013   Procedure: ANTERIOR LATERAL LUMBAR FUSION 3 LEVELS;  Surgeon: Erline Levine, MD;  Location: [facility];  Service: Neurosurgery;  Laterality: Right;  Right Lumbar Two-Three Lumbar Three-Four Lumbar Four-Five  Anterolateral Fusion  . BREAST LUMPECTOMY WITH RADIOACTIVE SEED LOCALIZATION Right 12/25/2017   Procedure: RIGHT BREAST LUMPECTOMY WITH RADIOACTIVE SEED LOCALIZATION;  Surgeon: Fanny Skates, MD;  Location: [facility];   Service: General;  Laterality: Right;  . [place]  . CESAREAN SECTION  1980  . COLONOSCOPY    . COLONOSCOPY WITH PROPOFOL N/A 06/23/2018   Procedure: COLONOSCOPY WITH PROPOFOL;  Surgeon: Clarene Essex, MD;  [facility];  Service: Endoscopy;  Laterality: N/A;  . CYSTOSCOPY N/A 06/26/2016   Procedure: CYSTOSCOPY;  Surgeon: Megan Salon, MD;  Location: [facility];  Service: Gynecology;  Laterality: N/A;  . DILATATION & CURETTAGE/HYSTEROSCOPY WITH MYOSURE N/A 01/24/2016   Procedure: DILATATION & CURETTAGE/HYSTEROSCOPY WITH MYOSURE;  Surgeon: Megan Salon, MD;  Location: [facility];  Service: Gynecology;  Laterality: N/A;  . FOOT SURGERY Right    early 20s-bone spur removal  . LAPAROSCOPIC HYSTERECTOMY Bilateral 06/26/2016   Procedure: HYSTERECTOMY TOTAL LAPAROSCOPIC;  Surgeon: Megan Salon, MD;  Location: [facility];  Service: Gynecology;  Laterality: Bilateral;  . LUMBAR PERCUTANEOUS PEDICLE SCREW 3 LEVEL N/A 04/17/2013   Procedure: LUMBAR PERCUTANEOUS PEDICLE SCREW 3 LEVEL;  Surgeon: Erline Levine, MD;  Location: [facility];  Service: Neurosurgery;  Laterality: N/A;  Decompression and Posterior Lumbar Interbody Fusion of Lumbar Five-Sacral One, Percutaneous Screws Lumbar Two through Sacral One  . MELANOMA EXCISION     knee  08 lft. elbow 11rt  . SALPINGOOPHORECTOMY Bilateral 06/26/2016   Procedure: SALPINGO OOPHORECTOMY;  Surgeon: Megan Salon, MD;  Location: [facility];  Service: Gynecology;  Laterality: Bilateral;  . SHOULDER SURGERY Right 04/28/2015  . TUBAL LIGATION  1986  . WLE  06/17/07   and left groin lymph node    I have reviewed the social history and family history with the patient and they are unchanged from previous note.  ALLERGIES:  is allergic to sulfur.  MEDICATIONS:  Current Outpatient Medications  Medication Sig Dispense Refill  . acetaminophen (TYLENOL) 500 MG tablet Take 1,000 mg by mouth 2 (two) times daily as needed for mild pain. Reported on 01/23/2016    .  Ascorbic Acid (VITAMIN C WITH ROSE HIPS) 1000 MG tablet Take 1,000 mg by mouth daily. Reported on 01/23/2016    . Calcium-Vitamin D-Vitamin K (VIACTIV CALCIUM PLUS D) 650-12.5-40 MG-MCG-MCG CHEW Chew 1 tablet by mouth daily.    . cetirizine (ZYRTEC) 10 MG tablet Take 10 mg by mouth daily. Reported on 01/23/2016    . diclofenac (VOLTAREN) 75 MG EC tablet Take 75 mg by mouth daily.    Marland Kitchen FIBER ADULT GUMMIES PO Take 2 tablets by mouth daily.    Marland Kitchen HYDROcodone-acetaminophen (NORCO) 5-325 MG tablet Take 1-2 tablets by mouth every 6 (six) hours as needed for moderate pain or severe pain. 20 tablet 0  . ibuprofen (ADVIL,MOTRIN) 200 MG tablet Take 400 mg by mouth daily as needed (for pain.).    Marland Kitchen Lactobacillus Rhamnosus, GG, (CULTURELLE PO) Take 1 capsule by mouth at bedtime.     Marland Kitchen losartan-hydrochlorothiazide (HYZAAR) 100-12.5 MG per tablet Take 1 tablet by mouth daily. Reported on 01/23/2016    . meclizine (ANTIVERT) 25 MG tablet Take 25 mg by mouth 3 (three) times daily as needed (vertigo). Reported on 01/23/2016    . methocarbamol (ROBAXIN) 500 MG tablet Take 500 mg by mouth 3 (three) times daily as needed for spasms.  0  . Multiple Vitamins-Minerals (ALIVE WOMENS 50+) TABS Take 1 tablet by mouth daily.    . ondansetron (ZOFRAN ODT) 4 MG disintegrating tablet 25m ODT q4 hours prn nausea/vomit (Patient taking differently: Take 4 mg by mouth every 4 (four) hours as needed (vertigo). ) 10 tablet 0  . pantoprazole (PROTONIX) 40 MG tablet Take 40 mg by mouth daily. Reported on 01/23/2016    . polyethylene glycol (MIRALAX / GLYCOLAX) packet Take 17 g by mouth at bedtime. Reported on 01/23/2016    . raloxifene (EVISTA) 60 MG tablet Take 1 tablet (60 mg total) by mouth daily. 90 tablet 3  . traMADol (ULTRAM) 50 MG tablet Take 50 mg by mouth 2 (two) times daily as needed (for pain).     .Marland Kitchenvenlafaxine XR (EFFEXOR XR) 150 MG 24 hr capsule Take 1 capsule (150 mg total) by mouth daily with breakfast. 90 capsule 1   No current  facility-administered medications for this visit.     PHYSICAL EXAMINATION: ECOG PERFORMANCE STATUS: 0 - Asymptomatic  Vitals:   05/15/19 1033  BP: 130/90  Pulse: 72  Resp: 16  Temp: 98 F (36.7 C)  SpO2: 96%   Filed Weights   05/15/19 1033  Weight: 139 lb 3.2 oz (63.1 kg)    GENERAL:alert, no distress and comfortable SKIN: skin color, texture, turgor are normal, no rashes or significant lesions (+) Mild skin rash on LE EYES: normal, Conjunctiva are pink and non-injected, sclera clear  NECK: supple, thyroid normal size, non-tender, without  nodularity LYMPH:  no palpable lymphadenopathy in the cervical, axillary  LUNGS: clear to auscultation and percussion with normal breathing effort HEART: regular rate & rhythm and no murmurs and no lower extremity edema ABDOMEN:abdomen soft, non-tender and normal bowel sounds Musculoskeletal:no cyanosis of digits and no clubbing  NEURO: alert & oriented x 3 with fluent speech, no focal motor/sensory deficits BREAST: S/p right lumpectomy: Surgical incision healed very well. No palpable mass, nodules or adenopathy bilaterally. Breast exam benign.   LABORATORY DATA:  I have reviewed the data as listed CBC Latest Ref Rng & Units 05/15/2019 11/14/2018 05/16/2018  WBC 4.0 - 10.5 K/uL 4.2 4.2 4.6  Hemoglobin 12.0 - 15.0 g/dL 12.7 13.2 12.6  Hematocrit 36.0 - 46.0 % 37.7 39.1 36.8  Platelets 150 - 400 K/uL 244 245 233     CMP Latest Ref Rng & Units 05/15/2019 11/14/2018 05/16/2018  Glucose 70 - 99 mg/dL 72 82 85  BUN 8 - 23 mg/dL 24(H) 19 21  Creatinine 0.44 - 1.00 mg/dL 0.79 0.87 0.83  Sodium 135 - 145 mmol/L 139 142 138  Potassium 3.5 - 5.1 mmol/L 4.0 4.1 4.2  Chloride 98 - 111 mmol/L 102 102 100  CO2 22 - 32 mmol/L _0 Calcium 8.9 - 10.3 mg/dL 9.2 9.5 9.8  Total Protein 6.5 - 8.1 g/dL 6.5 6.8 6.6  Total Bilirubin 0.3 - 1.2 mg/dL 0.4 0.4 0.3  Alkaline Phos 38 - 126 U/L 69 70 64  AST 15 - 41 U/L _1 ALT 0 - 44 U/L _2 RADIOGRAPHIC STUDIES: I have personally reviewed the radiological images as listed and agreed with the findings in the report. No results found.   ASSESSMENT & PLAN:  Melissa Mcclure is a 63 y.o. female with   1. Ductal carcinoma in situ (DCIS) of right breast, ER/PR Positive, Grade 1 -She was diagnosed in 10/2017. She is s/p right breast lumpectomy and adjuvant radiation.  -She started anti-estrogen therapy with Raloxifene in 02/2018. She is tolerating well with manageable hot flashes on higher dose Effexor.  -I discussed her Raloxifene can lead to weight gain, I encouraged her to watch her weight and encouraged a healthy diet and to remain active.  -She is clinically doing well. Lab reviewed, her CBC and CMP are within normal limits. Her 10/2018 mammogram and breast exam today was unremarkable.  -Continue Raloxifene.  -F/u in 6 months   2. Genetic Counseling was negative forpathogenic mutation in any of the genes analyzed.   3. HTN, history of skin melanoma -Follow-up with PCP   4. Hot Flashes  -Gabapentin did not work well enough for her.  -Improved and controlled on 111m effexor. Will continue   5. Bone health  [facility] of -0.4 at left femur neck.  -I discussed Raloxifene helps strengthen her bone density.  -Given she is on Raloxifene strengthens her bone, will repeat every 3-5 years.   6. Osteoarthritis  -Mainly in hands, back and neck  -She is on pain medication as needed. I discussed she can also use Tylenol   PLAN: -I refilled Effexor today  -Continue Raloxifene, refilled today   -Lab and f/u in 6 months  -Mammogram in 10/2019   No problem-specific Assessment & Plan notes found for this encounter.   No orders of the defined types were placed in this encounter.  All questions were answered. The patient knows  to call the clinic with any problems, questions or concerns. No barriers to learning was detected. I  spent 15 minutes counseling the patient face to face. The total time spent in the appointment was 20 minutes and more than 50% was on counseling and review of test results     Truitt Merle, MD 05/15/2019   I, Joslyn Devon, am acting as scribe for Truitt Merle, MD.   I have reviewed the above documentation for accuracy and completeness, and I agree with the above.

## 2019-05-15 ENCOUNTER — Inpatient Hospital Stay: Payer: BC Managed Care – PPO | Attending: Hematology

## 2019-05-15 ENCOUNTER — Other Ambulatory Visit: Payer: Self-pay

## 2019-05-15 ENCOUNTER — Inpatient Hospital Stay (HOSPITAL_BASED_OUTPATIENT_CLINIC_OR_DEPARTMENT_OTHER): Payer: BC Managed Care – PPO | Admitting: Hematology

## 2019-05-15 ENCOUNTER — Telehealth: Payer: Self-pay | Admitting: Hematology

## 2019-05-15 ENCOUNTER — Encounter: Payer: Self-pay | Admitting: Hematology

## 2019-05-15 VITALS — BP 130/90 | HR 72 | Temp 98.0°F | Resp 16 | Ht 62.5 in | Wt 139.2 lb

## 2019-05-15 DIAGNOSIS — D0511 Intraductal carcinoma in situ of right breast: Secondary | ICD-10-CM

## 2019-05-15 DIAGNOSIS — Z79810 Long term (current) use of selective estrogen receptor modulators (SERMs): Secondary | ICD-10-CM | POA: Diagnosis not present

## 2019-05-15 DIAGNOSIS — Z800 Family history of malignant neoplasm of digestive organs: Secondary | ICD-10-CM | POA: Insufficient documentation

## 2019-05-15 DIAGNOSIS — Z170 Estrogen receptor positive status [ER+]: Secondary | ICD-10-CM | POA: Diagnosis not present

## 2019-05-15 DIAGNOSIS — N951 Menopausal and female climacteric states: Secondary | ICD-10-CM | POA: Diagnosis not present

## 2019-05-15 DIAGNOSIS — Z85820 Personal history of malignant melanoma of skin: Secondary | ICD-10-CM | POA: Insufficient documentation

## 2019-05-15 DIAGNOSIS — I10 Essential (primary) hypertension: Secondary | ICD-10-CM | POA: Diagnosis not present

## 2019-05-15 DIAGNOSIS — Z79899 Other long term (current) drug therapy: Secondary | ICD-10-CM | POA: Diagnosis not present

## 2019-05-15 DIAGNOSIS — Z923 Personal history of irradiation: Secondary | ICD-10-CM | POA: Insufficient documentation

## 2019-05-15 LAB — CMP (CANCER CENTER ONLY)
ALT: 26 U/L (ref 0–44)
AST: 25 U/L (ref 15–41)
Albumin: 3.9 g/dL (ref 3.5–5.0)
Alkaline Phosphatase: 69 U/L (ref 38–126)
Anion gap: 10 (ref 5–15)
BUN: 24 mg/dL — ABNORMAL HIGH (ref 8–23)
CO2: 27 mmol/L (ref 22–32)
Calcium: 9.2 mg/dL (ref 8.9–10.3)
Chloride: 102 mmol/L (ref 98–111)
Creatinine: 0.79 mg/dL (ref 0.44–1.00)
GFR, Est AFR Am: >60 mL/min (ref >60)
GFR, Estimated: >60 mL/min (ref >60)
Glucose, Bld: 72 mg/dL (ref 70–99)
Potassium: 4 mmol/L (ref 3.5–5.1)
Sodium: 139 mmol/L (ref 135–145)
Total Bilirubin: 0.4 mg/dL (ref 0.3–1.2)
Total Protein: 6.5 g/dL (ref 6.5–8.1)

## 2019-05-15 LAB — CBC WITH DIFFERENTIAL (CANCER CENTER ONLY)
Abs Immature Granulocytes: 0.01 10*3/uL (ref 0.00–0.07)
Basophils Absolute: 0 10*3/uL (ref 0.0–0.1)
Basophils Relative: 1 %
Eosinophils Absolute: 0.1 10*3/uL (ref 0.0–0.5)
Eosinophils Relative: 3 %
HCT: 37.7 % (ref 36.0–46.0)
Hemoglobin: 12.7 g/dL (ref 12.0–15.0)
Immature Granulocytes: 0 %
Lymphocytes Relative: 31 %
Lymphs Abs: 1.3 10*3/uL (ref 0.7–4.0)
MCH: 31.8 pg (ref 26.0–34.0)
MCHC: 33.7 g/dL (ref 30.0–36.0)
MCV: 94.5 fL (ref 80.0–100.0)
Monocytes Absolute: 0.5 10*3/uL (ref 0.1–1.0)
Monocytes Relative: 11 %
Neutro Abs: 2.2 10*3/uL (ref 1.7–7.7)
Neutrophils Relative %: 54 %
Platelet Count: 244 10*3/uL (ref 150–400)
RBC: 3.99 MIL/uL (ref 3.87–5.11)
RDW: 12.3 % (ref 11.5–15.5)
WBC Count: 4.2 10*3/uL (ref 4.0–10.5)
nRBC: 0 % (ref 0.0–0.2)

## 2019-05-15 MED ORDER — VENLAFAXINE HCL ER 150 MG PO CP24: 150.0000 mg | ORAL_CAPSULE | Freq: Every day | ORAL | 1 refills | Status: DC

## 2019-05-15 MED ORDER — RALOXIFENE HCL 60 MG PO TABS: 60.0000 mg | ORAL_TABLET | Freq: Every day | ORAL | 3 refills | Status: DC

## 2019-05-15 NOTE — Telephone Encounter (Signed)
Scheduled appt per 8/21 los.  Left a voice message of appt date and time.

## 2019-05-18 DIAGNOSIS — R2689 Other abnormalities of gait and mobility: Secondary | ICD-10-CM | POA: Diagnosis not present

## 2019-05-20 DIAGNOSIS — M1812 Unilateral primary osteoarthritis of first carpometacarpal joint, left hand: Secondary | ICD-10-CM | POA: Diagnosis not present

## 2019-05-20 DIAGNOSIS — M180 Bilateral primary osteoarthritis of first carpometacarpal joints: Secondary | ICD-10-CM | POA: Diagnosis not present

## 2019-05-20 DIAGNOSIS — M1811 Unilateral primary osteoarthritis of first carpometacarpal joint, right hand: Secondary | ICD-10-CM | POA: Diagnosis not present

## 2019-05-20 DIAGNOSIS — M79644 Pain in right finger(s): Secondary | ICD-10-CM | POA: Diagnosis not present

## 2019-05-25 DIAGNOSIS — R2689 Other abnormalities of gait and mobility: Secondary | ICD-10-CM | POA: Diagnosis not present

## 2019-06-17 DIAGNOSIS — M654 Radial styloid tenosynovitis [de Quervain]: Secondary | ICD-10-CM | POA: Diagnosis not present

## 2019-06-17 DIAGNOSIS — M25532 Pain in left wrist: Secondary | ICD-10-CM | POA: Diagnosis not present

## 2019-06-17 DIAGNOSIS — M13841 Other specified arthritis, right hand: Secondary | ICD-10-CM | POA: Diagnosis not present

## 2019-06-17 DIAGNOSIS — M25531 Pain in right wrist: Secondary | ICD-10-CM | POA: Diagnosis not present

## 2019-06-30 DIAGNOSIS — Z23 Encounter for immunization: Secondary | ICD-10-CM | POA: Diagnosis not present

## 2019-08-06 ENCOUNTER — Other Ambulatory Visit: Payer: Self-pay | Admitting: Nurse Practitioner

## 2019-08-06 MED ORDER — RALOXIFENE HCL 60 MG PO TABS: 60.0000 mg | ORAL_TABLET | Freq: Every day | ORAL | 3 refills | Status: DC

## 2019-08-06 MED ORDER — VENLAFAXINE HCL ER 150 MG PO CP24: 150.0000 mg | ORAL_CAPSULE | Freq: Every day | ORAL | 1 refills | Status: DC

## 2019-08-28 DIAGNOSIS — H5010 Unspecified exotropia: Secondary | ICD-10-CM | POA: Diagnosis not present

## 2019-08-28 DIAGNOSIS — H2513 Age-related nuclear cataract, bilateral: Secondary | ICD-10-CM | POA: Diagnosis not present

## 2019-08-28 DIAGNOSIS — H524 Presbyopia: Secondary | ICD-10-CM | POA: Diagnosis not present

## 2019-08-28 DIAGNOSIS — H40033 Anatomical narrow angle, bilateral: Secondary | ICD-10-CM | POA: Diagnosis not present

## 2019-08-28 DIAGNOSIS — H40013 Open angle with borderline findings, low risk, bilateral: Secondary | ICD-10-CM | POA: Diagnosis not present

## 2019-09-01 DIAGNOSIS — Z23 Encounter for immunization: Secondary | ICD-10-CM | POA: Diagnosis not present

## 2019-09-02 DIAGNOSIS — D225 Melanocytic nevi of trunk: Secondary | ICD-10-CM | POA: Diagnosis not present

## 2019-09-02 DIAGNOSIS — Z08 Encounter for follow-up examination after completed treatment for malignant neoplasm: Secondary | ICD-10-CM | POA: Diagnosis not present

## 2019-09-02 DIAGNOSIS — Z85820 Personal history of malignant melanoma of skin: Secondary | ICD-10-CM | POA: Diagnosis not present

## 2019-09-02 DIAGNOSIS — X32XXXD Exposure to sunlight, subsequent encounter: Secondary | ICD-10-CM | POA: Diagnosis not present

## 2019-09-02 DIAGNOSIS — L570 Actinic keratosis: Secondary | ICD-10-CM | POA: Diagnosis not present

## 2019-09-02 DIAGNOSIS — Z1283 Encounter for screening for malignant neoplasm of skin: Secondary | ICD-10-CM | POA: Diagnosis not present

## 2019-09-28 DIAGNOSIS — Z800 Family history of malignant neoplasm of digestive organs: Secondary | ICD-10-CM | POA: Diagnosis not present

## 2019-09-28 DIAGNOSIS — K2100 Gastro-esophageal reflux disease with esophagitis, without bleeding: Secondary | ICD-10-CM | POA: Diagnosis not present

## 2019-09-28 DIAGNOSIS — R198 Other specified symptoms and signs involving the digestive system and abdomen: Secondary | ICD-10-CM | POA: Diagnosis not present

## 2019-10-01 DIAGNOSIS — M180 Bilateral primary osteoarthritis of first carpometacarpal joints: Secondary | ICD-10-CM | POA: Diagnosis not present

## 2019-11-06 ENCOUNTER — Telehealth: Payer: Self-pay

## 2019-11-06 ENCOUNTER — Other Ambulatory Visit: Payer: Self-pay

## 2019-11-06 DIAGNOSIS — D0511 Intraductal carcinoma in situ of right breast: Secondary | ICD-10-CM

## 2019-11-06 NOTE — Telephone Encounter (Signed)
Order for Amorita faxed to [facility].

## 2019-11-11 NOTE — Progress Notes (Signed)
[place]   Telephone:([phone]) [phone] Fax:(336) [phone]   Clinic Follow up Note   Patient Care Team: Melissa Dus, MD as PCP - General (Family Medicine)  I connected with Melissa Mcclure on 11/16/2019 at 10:40 AM EST by video enabled telemedicine visit and verified that I am speaking with the correct person using two identifiers.  I discussed the limitations, risks, security and privacy concerns of performing an evaluation and management service by telephone and the availability of in person appointments. I also discussed with the patient that there may be a patient responsible charge related to this service. The patient expressed understanding and agreed to proceed.   Patient's location:  Her home  Provider's location:  My Office   CHIEF COMPLAINT: F/u of right breast DCIS  SUMMARY OF ONCOLOGIC HISTORY: Oncology History Overview Note  Cancer Staging Ductal carcinoma in situ (DCIS) of right breast Staging form: Breast, AJCC 8th Edition - Clinical stage from 11/19/2017: Stage 0 (cTis (DCIS), cN0, cM0, ER: Positive, PR: Positive, HER2: Not assessed ) - Signed by Melissa Merle, MD on 11/26/2017     Ductal carcinoma in situ (DCIS) of right breast  11/07/2017 Mammogram    Diagnostic Mammogram Right breast 11/07/17 IMPRESSION:  The results of the exam were reviewed with the patient.  The 3 cm are of grouped branching linear calcifications in the right breast lower inner quadrant middle depth are at moderate concern but not classic for malignancy.  A Stereotactic biopsy is recommended.   11/19/2017 Initial Biopsy   Diagnosis 11/19/17 Breast, right, needle core biopsy - DUCTAL CARCINOMA IN SITU WITH CALCIFICATIONS. - SEE COMMENT.    11/19/2017 Receptors her2   Estrogen Receptor: 90%, POSITIVE, STRONG STAINING INTENSITY Progesterone Receptor: 25%, POSITIVE, STRONG STAINING INTENSITY    11/19/2017 Initial Diagnosis   Ductal carcinoma in situ (DCIS) of right breast   12/23/2017  Genetic Testing   Testing did not reveal a pathogenic mutation in any of the genes analyzed.A copy of the genetic test report will be scanned into Epic under the Media tab.  The genes analyzed were the 83 genes on [facility]'s Multi-Cancer panel (ALK, APC, ATM, AXIN2, BAP1, BARD1, BLM, BMPR1A, BRCA1, BRCA2, BRIP1, CASR, CDC73, CDH1, CDK4, CDKN1B, CDKN1C, CDKN2A, CEBPA, CHEK2, CTNNA1, DICER1, DIS3L2, EGFR, EPCAM, FH, FLCN, GATA2, GPC3, GREM1, HOXB13, HRAS, KIT, MAX, MEN1, MET, MITF, MLH1, MSH2, MSH3, MSH6, MUTYH, NBN, NF1, NF2, NTHL1, PALB2, PDGFRA, PHOX2B, PMS2, POLD1, POLE, POT1, PRKAR1A, PTCH1, PTEN, RAD50, RAD51C, RAD51D, RB1, RECQL4, RET, RUNX1, SDHA, SDHAF2, SDHB, SDHC, SDHD, SMAD4, SMARCA4, SMARCB1, SMARCE1, STK11, SUFU, TERC, TERT, TMEM127, TP53, TSC1, TSC2, VHL, WRN, WT1).   12/25/2017 Pathology Results   Diagnosis, 12/25/2017 Breast, lumpectomy, Right w/seed - DUCTAL CARCINOMA IN SITU WITH CALCIFICATIONS, LOW GRADE, SPANNING 1.3 CM. - THE SURGICAL RESECTION MARGINS ARE NEGATIVE FOR CARCINOMA   01/20/2018 - 02/14/2018 Radiation Therapy   Radiation treatment dates:   01/20/2018 - 02/14/2018  Site/dose:   The patient initially received a dose of 42.5 Gy in 17 fractions to the right breast using whole-breast tangent fields. This was delivered using a 3-D conformal technique. The patient then received a boost to the seroma. This delivered an additional 7.5 Gy in 3 fractions using a 3 field photon technique due to the depth of the seroma. The total dose was 50 Gy.  Narrative: The patient tolerated radiation treatment relatively well.   The patient had some expected skin irritation as she progressed during treatment. Moist desquamation was not present at the  end of treatment.    02/2018 -  Anti-estrogen oral therapy   Raloxifene 83m once daily starting in 02/2018      CURRENT THERAPY:  Raloxifene 674monce daily starting in 02/2018  INTERVAL HISTORY:  Melissa Mcclure here for a follow up of  right breast DCIS. She was last seen by me 6 months ago. She was able to identify herself with face to face video. She notes she is doing well. She notes she has been on high dose Effexor which controls her hot flashes. She will have mammogram and DEXA in 11/2019. She plans to move into a smaller home this month.  She notes due to basal joint arthritis in her hands she may need surgery for this. She does not attribute this to Raloxifene. She notes working on losing weight after recent weight gain. She also notes right breast pain occasionally.     REVIEW OF SYSTEMS:   Constitutional: Denies fevers, chills or abnormal weight loss Eyes: Denies blurriness of vision Ears, nose, mouth, throat, and face: Denies mucositis or sore throat Respiratory: Denies cough, dyspnea or wheezes Cardiovascular: Denies palpitation, chest discomfort or lower extremity swelling Gastrointestinal:  Denies nausea, heartburn or change in bowel habits Skin: Denies abnormal skin rashes MSK: (+) Basal joint arthritis Lymphatics: Denies new lymphadenopathy or easy bruising Neurological:Denies numbness, tingling or new weaknesses Behavioral/Psych: Mood is stable, no new changes  Breast: (+) right breast pain occasionally.  All other systems were reviewed with the patient and are negative.  MEDICAL HISTORY:  Past Medical History:  Diagnosis Date  . Acid reflux    GERD  . [place]   melanoma-left knee, right arm  . Chronic back pain   . Complication of anesthesia   . Ductal carcinoma in situ (DCIS) of right breast 10/2017  . Family history of adverse reaction to anesthesia    mother has PONV  . Family history of colon cancer   . Family history of melanoma   . Family history of ovarian cancer   . Genetic testing 12/23/2017   STAT Breast panel with reflex to Multi-Cancer panel (83 genes) @ [facility] - No pathogenic mutations detected  . GERD (gastroesophageal reflux disease)   . H/O hiatal hernia   . Heart  murmur    no problems  . Hypertension   . PONV (postoperative nausea and vomiting)    "zofran works great  . Seasonal allergies   . SVD (spontaneous vaginal delivery)    x 1  . Vertigo     SURGICAL HISTORY: Past Surgical History:  Procedure Laterality Date  . ANTERIOR LAT LUMBAR FUSION Right 04/17/2013   Procedure: ANTERIOR LATERAL LUMBAR FUSION 3 LEVELS;  Surgeon: JoErline LevineMD;  [facility];  Service: Neurosurgery;  Laterality: Right;  Right Lumbar Two-Three Lumbar Three-Four Lumbar Four-Five  Anterolateral Fusion  . BREAST LUMPECTOMY WITH RADIOACTIVE SEED LOCALIZATION Right 12/25/2017   Procedure: RIGHT BREAST LUMPECTOMY WITH RADIOACTIVE SEED LOCALIZATION;  Surgeon: InFanny SkatesMD;  [place] [facility] Service: General;  Laterality: Right;  . [place] CESAREAN SE[place]  1980  . COLONOSCOPY    . COLONOSCOPY WITH PROPOFOL N/A 06/23/2018   Procedure: COLONOSCOPY WITH PROPOFOL;  Surgeon: MaClarene EssexMD;  [facility];  Service: Endoscopy;  Laterality: N/A;  . CYSTOSCOPY N/A 06/26/2016   Procedure: CYSTOSCOPY;  Surgeon: MaMegan SalonMD;  [facility];  Service: Gynecology;  Laterality: N/A;  . DILATATION &  CURETTAGE/HYSTEROSCOPY WITH MYOSURE N/A 01/24/2016   Procedure: DILATATION & CURETTAGE/HYSTEROSCOPY WITH MYOSURE;  Surgeon: Megan Salon, MD;  Location: [facility];  Service: Gynecology;  Laterality: N/A;  . FOOT SURGERY Right    early 20s-bone spur removal  . LAPAROSCOPIC HYSTERECTOMY Bilateral 06/26/2016   Procedure: HYSTERECTOMY TOTAL LAPAROSCOPIC;  Surgeon: Megan Salon, MD;  Location: [facility];  Service: Gynecology;  Laterality: Bilateral;  . LUMBAR PERCUTANEOUS PEDICLE SCREW 3 LEVEL N/A 04/17/2013   Procedure: LUMBAR PERCUTANEOUS PEDICLE SCREW 3 LEVEL;  Surgeon: Erline Levine, MD;  Location: [facility];  Service: Neurosurgery;  Laterality: N/A;  Decompression and Posterior Lumbar Interbody Fusion of Lumbar Five-Sacral  One, Percutaneous Screws Lumbar Two through Sacral One  . MELANOMA EXCISION     knee 08 lft. elbow 11rt  . SALPINGOOPHORECTOMY Bilateral 06/26/2016   Procedure: SALPINGO OOPHORECTOMY;  Surgeon: Megan Salon, MD;  Location: [facility];  Service: Gynecology;  Laterality: Bilateral;  . SHOULDER SURGERY Right 04/28/2015  . TUBAL LIGATION  1986  . WLE  06/17/07   and left groin lymph node    I have reviewed the social history and family history with the patient and they are unchanged from previous note.  ALLERGIES:  is allergic to sulfur.  MEDICATIONS:  Current Outpatient Medications  Medication Sig Dispense Refill  . acetaminophen (TYLENOL) 500 MG tablet Take 1,000 mg by mouth 2 (two) times daily as needed for mild pain. Reported on 01/23/2016    . Ascorbic Acid (VITAMIN C WITH ROSE HIPS) 1000 MG tablet Take 1,000 mg by mouth daily. Reported on 01/23/2016    . Calcium-Vitamin D-Vitamin K (VIACTIV CALCIUM PLUS D) 650-12.5-40 MG-MCG-MCG CHEW Chew 1 tablet by mouth daily.    . cetirizine (ZYRTEC) 10 MG tablet Take 10 mg by mouth daily. Reported on 01/23/2016    . diclofenac (VOLTAREN) 75 MG EC tablet Take 75 mg by mouth daily.    Marland Kitchen FIBER ADULT GUMMIES PO Take 2 tablets by mouth daily.    Marland Kitchen HYDROcodone-acetaminophen (NORCO) 5-325 MG tablet Take 1-2 tablets by mouth every 6 (six) hours as needed for moderate pain or severe pain. 20 tablet 0  . ibuprofen (ADVIL,MOTRIN) 200 MG tablet Take 400 mg by mouth daily as needed (for pain.).    Marland Kitchen Lactobacillus Rhamnosus, GG, (CULTURELLE PO) Take 1 capsule by mouth at bedtime.     Marland Kitchen losartan-hydrochlorothiazide (HYZAAR) 100-12.5 MG per tablet Take 1 tablet by mouth daily. Reported on 01/23/2016    . meclizine (ANTIVERT) 25 MG tablet Take 25 mg by mouth 3 (three) times daily as needed (vertigo). Reported on 01/23/2016    . methocarbamol (ROBAXIN) 500 MG tablet Take 500 mg by mouth 3 (three) times daily as needed for spasms.  0  . Multiple Vitamins-Minerals (ALIVE WOMENS 50+)  TABS Take 1 tablet by mouth daily.    . ondansetron (ZOFRAN ODT) 4 MG disintegrating tablet 52m ODT q4 hours prn nausea/vomit (Patient taking differently: Take 4 mg by mouth every 4 (four) hours as needed (vertigo). ) 10 tablet 0  . pantoprazole (PROTONIX) 40 MG tablet Take 40 mg by mouth daily. Reported on 01/23/2016    . polyethylene glycol (MIRALAX / GLYCOLAX) packet Take 17 g by mouth at bedtime. Reported on 01/23/2016    . raloxifene (EVISTA) 60 MG tablet Take 1 tablet (60 mg total) by mouth daily. 90 tablet 3  . traMADol (ULTRAM) 50 MG tablet Take 50 mg by mouth 2 (two) times daily as needed (for pain).     .Marland Kitchen  venlafaxine XR (EFFEXOR XR) 150 MG 24 hr capsule Take 1 capsule (150 mg total) by mouth daily with breakfast. 90 capsule 1   No current facility-administered medications for this visit.    PHYSICAL EXAMINATION: ECOG PERFORMANCE STATUS: 1 - Symptomatic but completely ambulatory  No vitals taken today, Exam not performed today  LABORATORY DATA:  I have reviewed the data as listed CBC Latest Ref Rng & Units 05/15/2019 11/14/2018 05/16/2018  WBC 4.0 - 10.5 K/uL 4.2 4.2 4.6  Hemoglobin 12.0 - 15.0 g/dL 12.7 13.2 12.6  Hematocrit 36.0 - 46.0 % 37.7 39.1 36.8  Platelets 150 - 400 K/uL 244 245 233     CMP Latest Ref Rng & Units 05/15/2019 11/14/2018 05/16/2018  Glucose 70 - 99 mg/dL 72 82 85  BUN 8 - 23 mg/dL 24(H) 19 21  Creatinine 0.44 - 1.00 mg/dL 0.79 0.87 0.83  Sodium 135 - 145 mmol/L 139 142 138  Potassium 3.5 - 5.1 mmol/L 4.0 4.1 4.2  Chloride 98 - 111 mmol/L 102 102 100  CO2 22 - 32 mmol/L '27 29 31  ' Calcium 8.9 - 10.3 mg/dL 9.2 9.5 9.8  Total Protein 6.5 - 8.1 g/dL 6.5 6.8 6.6  Total Bilirubin 0.3 - 1.2 mg/dL 0.4 0.4 0.3  Alkaline Phos 38 - 126 U/L 69 70 64  AST 15 - 41 U/L '25 28 25  ' ALT 0 - 44 U/L '26 26 23      ' RADIOGRAPHIC STUDIES: I have personally reviewed the radiological images as listed and agreed with the findings in the report. No results found.   ASSESSMENT  & PLAN:  Melissa Mcclure is a 64 y.o. female with    1. Ductal carcinoma in situ (DCIS) of right breast, ER/PR Positive, Grade 1 -She was diagnosed in 10/2017. She is s/p right breast lumpectomy and adjuvant radiation.  -She started anti-estrogen therapy with Raloxifene in 02/2018. She is tolerating wellwith manageable hot flashes on higher dose Effexor.  -She is clinically doing well. She has occasional right breast pain from surgery. I encouraged her to stretch her arm and continue being active. Her physical exam and her 10/2018 mammogram were unremarkable. There is no clinical concern for recurrence. -Continue surveillance. She is due for mammogram and plans to schedule for 11/2019 -Continue Raloxifene.  -F/u in 6 months with lab   2. Genetic Counselingwas negative forpathogenic mutation in any of the genes analyzed.  3. HTN, history of skin melanoma -Follow-up with PCP  4. Hot Flashes  -Gabapentindid not work well enough for her.  -Improved and controlled on 142m effexor. Will continue   5. Bone health  -[facility]-Score of -0.4 at left femur neck.  -I discussed Raloxifene helps strengthen her bone density.  -Given she is on Raloxifene strengthens her bone. Plan to repeat in 11/2019  6. Osteoarthritis  -Mainly in hands, back and neck  -She notes her basal joint arthritis in her hands have progressed and may have surgery on it.    PLAN: -Continue Raloxifene, refilled today   -Lab and f/u in 6 months -[facility]   No problem-specific Assessment & Plan notes found for this encounter.   No orders of the defined types were placed in this encounter.  I discussed the assessment and treatment plan with the patient. The patient was provided an opportunity to ask questions and all were answered. The patient agreed with the plan and demonstrated an understanding  of the instructions.  The patient was advised  to call back or seek an in-person evaluation if the symptoms worsen or if the condition fails to improve as anticipated.  The total time spent in the appointment was 20 minutes.     Melissa Merle, MD 11/16/2019   I, Joslyn Devon, am acting as scribe for Melissa Merle, MD.   I have reviewed the above documentation for accuracy and completeness, and I agree with the above.

## 2019-11-12 ENCOUNTER — Telehealth: Payer: Self-pay | Admitting: Hematology

## 2019-11-12 NOTE — Telephone Encounter (Signed)
Per 2/17 sch msg. Pt is aware of ov change to virtual for 2/22

## 2019-11-16 ENCOUNTER — Encounter: Payer: Self-pay | Admitting: Hematology

## 2019-11-16 ENCOUNTER — Other Ambulatory Visit: Payer: Self-pay

## 2019-11-16 ENCOUNTER — Inpatient Hospital Stay: Payer: BC Managed Care – PPO | Attending: Hematology | Admitting: Hematology

## 2019-11-16 DIAGNOSIS — Z79811 Long term (current) use of aromatase inhibitors: Secondary | ICD-10-CM | POA: Diagnosis not present

## 2019-11-16 DIAGNOSIS — M13842 Other specified arthritis, left hand: Secondary | ICD-10-CM

## 2019-11-16 DIAGNOSIS — D0511 Intraductal carcinoma in situ of right breast: Secondary | ICD-10-CM

## 2019-11-16 DIAGNOSIS — Z79899 Other long term (current) drug therapy: Secondary | ICD-10-CM

## 2019-11-16 DIAGNOSIS — K219 Gastro-esophageal reflux disease without esophagitis: Secondary | ICD-10-CM

## 2019-11-16 DIAGNOSIS — M13841 Other specified arthritis, right hand: Secondary | ICD-10-CM

## 2019-11-16 DIAGNOSIS — Z170 Estrogen receptor positive status [ER+]: Secondary | ICD-10-CM | POA: Diagnosis not present

## 2019-11-16 DIAGNOSIS — I10 Essential (primary) hypertension: Secondary | ICD-10-CM

## 2019-11-16 MED ORDER — RALOXIFENE HCL 60 MG PO TABS: 60.0000 mg | ORAL_TABLET | Freq: Every day | ORAL | 3 refills | Status: DC

## 2019-12-10 DIAGNOSIS — Z780 Asymptomatic menopausal state: Secondary | ICD-10-CM | POA: Diagnosis not present

## 2019-12-10 DIAGNOSIS — Z853 Personal history of malignant neoplasm of breast: Secondary | ICD-10-CM | POA: Diagnosis not present

## 2019-12-10 DIAGNOSIS — R922 Inconclusive mammogram: Secondary | ICD-10-CM | POA: Diagnosis not present

## 2019-12-10 DIAGNOSIS — Z13820 Encounter for screening for osteoporosis: Secondary | ICD-10-CM | POA: Diagnosis not present

## 2019-12-21 ENCOUNTER — Telehealth: Payer: Self-pay

## 2019-12-21 NOTE — Telephone Encounter (Signed)
Ms Melissa Mcclure returned my call.  I let her know her Dexa scan was normal.  She verbalized understanding.

## 2019-12-21 NOTE — Telephone Encounter (Signed)
Left message for Melissa Mcclure to call our office for dexa scan results.

## 2019-12-22 ENCOUNTER — Telehealth: Payer: Self-pay

## 2019-12-22 DIAGNOSIS — M13132 Monoarthritis, not elsewhere classified, left wrist: Secondary | ICD-10-CM | POA: Diagnosis not present

## 2019-12-22 DIAGNOSIS — M654 Radial styloid tenosynovitis [de Quervain]: Secondary | ICD-10-CM | POA: Diagnosis not present

## 2019-12-22 DIAGNOSIS — M65841 Other synovitis and tenosynovitis, right hand: Secondary | ICD-10-CM | POA: Diagnosis not present

## 2019-12-22 DIAGNOSIS — M65842 Other synovitis and tenosynovitis, left hand: Secondary | ICD-10-CM | POA: Diagnosis not present

## 2019-12-22 DIAGNOSIS — M1812 Unilateral primary osteoarthritis of first carpometacarpal joint, left hand: Secondary | ICD-10-CM | POA: Diagnosis not present

## 2019-12-22 DIAGNOSIS — M65312 Trigger thumb, left thumb: Secondary | ICD-10-CM | POA: Diagnosis not present

## 2019-12-22 DIAGNOSIS — M180 Bilateral primary osteoarthritis of first carpometacarpal joints: Secondary | ICD-10-CM | POA: Diagnosis not present

## 2019-12-22 NOTE — Telephone Encounter (Signed)
Tried calling patient with normal bone density results as written by Dr. Sabra Heck. No answer, left a detailed message on patient's voicemail with results. Okay per DPR to leave a detailed message on both home and mobile numbers. Advised patient, if any questions, give our office a call at [phone].  Okay to close encounter.

## 2020-01-06 DIAGNOSIS — M1812 Unilateral primary osteoarthritis of first carpometacarpal joint, left hand: Secondary | ICD-10-CM | POA: Diagnosis not present

## 2020-01-20 DIAGNOSIS — M180 Bilateral primary osteoarthritis of first carpometacarpal joints: Secondary | ICD-10-CM | POA: Diagnosis not present

## 2020-01-20 DIAGNOSIS — M1812 Unilateral primary osteoarthritis of first carpometacarpal joint, left hand: Secondary | ICD-10-CM | POA: Diagnosis not present

## 2020-02-01 ENCOUNTER — Other Ambulatory Visit: Payer: Self-pay | Admitting: Nurse Practitioner

## 2020-02-03 DIAGNOSIS — M79645 Pain in left finger(s): Secondary | ICD-10-CM | POA: Diagnosis not present

## 2020-02-03 DIAGNOSIS — M1812 Unilateral primary osteoarthritis of first carpometacarpal joint, left hand: Secondary | ICD-10-CM | POA: Diagnosis not present

## 2020-02-08 ENCOUNTER — Other Ambulatory Visit: Payer: Self-pay

## 2020-02-08 DIAGNOSIS — D0511 Intraductal carcinoma in situ of right breast: Secondary | ICD-10-CM

## 2020-02-08 MED ORDER — VENLAFAXINE HCL ER 150 MG PO CP24: 150.0000 mg | ORAL_CAPSULE | Freq: Every day | ORAL | 1 refills | Status: DC

## 2020-02-10 ENCOUNTER — Telehealth: Payer: Self-pay

## 2020-02-10 ENCOUNTER — Other Ambulatory Visit: Payer: Self-pay | Admitting: Nurse Practitioner

## 2020-02-10 ENCOUNTER — Other Ambulatory Visit: Payer: Self-pay

## 2020-02-10 DIAGNOSIS — D0511 Intraductal carcinoma in situ of right breast: Secondary | ICD-10-CM

## 2020-02-10 MED ORDER — VENLAFAXINE HCL ER 150 MG PO CP24: 150.0000 mg | ORAL_CAPSULE | Freq: Every day | ORAL | 1 refills | Status: DC

## 2020-02-10 NOTE — Telephone Encounter (Signed)
Ms Barabas left vm stating her pharmacy never received the effexor refill.  I left her a message letting her know that rx was refilled on 5/17 and the pharmacy it was sent to.  I told her to call back if that is the wrong pharmacy

## 2020-02-16 DIAGNOSIS — I10 Essential (primary) hypertension: Secondary | ICD-10-CM | POA: Diagnosis not present

## 2020-02-16 DIAGNOSIS — K219 Gastro-esophageal reflux disease without esophagitis: Secondary | ICD-10-CM | POA: Diagnosis not present

## 2020-02-16 DIAGNOSIS — Z13220 Encounter for screening for lipoid disorders: Secondary | ICD-10-CM | POA: Diagnosis not present

## 2020-02-16 DIAGNOSIS — Z0000 Encounter for general adult medical examination without abnormal findings: Secondary | ICD-10-CM | POA: Diagnosis not present

## 2020-02-16 DIAGNOSIS — C50919 Malignant neoplasm of unspecified site of unspecified female breast: Secondary | ICD-10-CM | POA: Diagnosis not present

## 2020-02-16 DIAGNOSIS — R42 Dizziness and giddiness: Secondary | ICD-10-CM | POA: Diagnosis not present

## 2020-02-17 DIAGNOSIS — M79645 Pain in left finger(s): Secondary | ICD-10-CM | POA: Diagnosis not present

## 2020-02-17 DIAGNOSIS — M79644 Pain in right finger(s): Secondary | ICD-10-CM | POA: Diagnosis not present

## 2020-03-02 DIAGNOSIS — M79644 Pain in right finger(s): Secondary | ICD-10-CM | POA: Diagnosis not present

## 2020-03-02 DIAGNOSIS — M79645 Pain in left finger(s): Secondary | ICD-10-CM | POA: Diagnosis not present

## 2020-03-02 DIAGNOSIS — M65312 Trigger thumb, left thumb: Secondary | ICD-10-CM | POA: Diagnosis not present

## 2020-03-16 DIAGNOSIS — M79645 Pain in left finger(s): Secondary | ICD-10-CM | POA: Diagnosis not present

## 2020-03-16 DIAGNOSIS — M79644 Pain in right finger(s): Secondary | ICD-10-CM | POA: Diagnosis not present

## 2020-03-31 DIAGNOSIS — Z23 Encounter for immunization: Secondary | ICD-10-CM | POA: Diagnosis not present

## 2020-04-13 DIAGNOSIS — M65832 Other synovitis and tenosynovitis, left forearm: Secondary | ICD-10-CM | POA: Diagnosis not present

## 2020-04-13 DIAGNOSIS — M180 Bilateral primary osteoarthritis of first carpometacarpal joints: Secondary | ICD-10-CM | POA: Diagnosis not present

## 2020-05-13 NOTE — Progress Notes (Signed)
[place]   Telephone:([phone]) [phone] [phone]) [phone]   Clinic Follow up Note   Patient Care Team: Maury Dus, MD as PCP - General (Family Medicine)  Date of Service: 05/16/2020  CHIEF COMPLAINT:  F/u of right breast DCIS  SUMMARY OF ONCOLOGIC HISTORY: Oncology History Overview Note  Cancer Staging Ductal carcinoma in situ (DCIS) of right breast Staging form: Breast, AJCC 8th Edition - Clinical stage from 11/19/2017: Stage 0 (cTis (DCIS), cN0, cM0, ER: Positive, PR: Positive, HER2: Not assessed ) - Signed by Truitt Merle, MD on 11/26/2017     Ductal carcinoma in situ (DCIS) of right breast  11/07/2017 Mammogram    Diagnostic Mammogram Right breast 11/07/17 IMPRESSION:  The results of the exam were reviewed with the patient.  The 3 cm are of grouped branching linear calcifications in the right breast lower inner quadrant middle depth are at moderate concern but not classic for malignancy.  A Stereotactic biopsy is recommended.   11/19/2017 Initial Biopsy   Diagnosis 11/19/17 Breast, right, needle core biopsy - DUCTAL CARCINOMA IN SITU WITH CALCIFICATIONS. - SEE COMMENT.    11/19/2017 Receptors her2   Estrogen Receptor: 90%, POSITIVE, STRONG STAINING INTENSITY Progesterone Receptor: 25%, POSITIVE, STRONG STAINING INTENSITY    11/19/2017 Initial Diagnosis   Ductal carcinoma in situ (DCIS) of right breast   12/23/2017 Genetic Testing   Testing did not reveal a pathogenic mutation in any of the genes analyzed.A copy of the genetic test report will be scanned into Epic under the Media tab.  The genes analyzed were the 83 genes on [facility]'s Multi-Cancer panel (ALK, APC, ATM, AXIN2, BAP1, BARD1, BLM, BMPR1A, BRCA1, BRCA2, BRIP1, CASR, CDC73, CDH1, CDK4, CDKN1B, CDKN1C, CDKN2A, CEBPA, CHEK2, CTNNA1, DICER1, DIS3L2, EGFR, EPCAM, FH, FLCN, GATA2, GPC3, GREM1, HOXB13, HRAS, KIT, MAX, MEN1, MET, MITF, MLH1, MSH2, MSH3, MSH6, MUTYH, NBN, NF1, NF2, NTHL1, PALB2, PDGFRA,  PHOX2B, PMS2, POLD1, POLE, POT1, PRKAR1A, PTCH1, PTEN, RAD50, RAD51C, RAD51D, RB1, RECQL4, RET, RUNX1, SDHA, SDHAF2, SDHB, SDHC, SDHD, SMAD4, SMARCA4, SMARCB1, SMARCE1, STK11, SUFU, TERC, TERT, TMEM127, TP53, TSC1, TSC2, VHL, WRN, WT1).   12/25/2017 Pathology Results   Diagnosis, 12/25/2017 Breast, lumpectomy, Right w/seed - DUCTAL CARCINOMA IN SITU WITH CALCIFICATIONS, LOW GRADE, SPANNING 1.3 CM. - THE SURGICAL RESECTION MARGINS ARE NEGATIVE FOR CARCINOMA   01/20/2018 - 02/14/2018 Radiation Therapy   Radiation treatment dates:   01/20/2018 - 02/14/2018  Site/dose:   The patient initially received a dose of 42.5 Gy in 17 fractions to the right breast using whole-breast tangent fields. This was delivered using a 3-D conformal technique. The patient then received a boost to the seroma. This delivered an additional 7.5 Gy in 3 fractions using a 3 field photon technique due to the depth of the seroma. The total dose was 50 Gy.  Narrative: The patient tolerated radiation treatment relatively well.   The patient had some expected skin irritation as she progressed during treatment. Moist desquamation was not present at the end of treatment.    02/2018 -  Anti-estrogen oral therapy   Raloxifene 38m once daily starting in 02/2018      CURRENT THERAPY:  Raloxifene 632monce daily starting in 02/2018. Hold For 2 months.   INTERVAL HISTORY:  Melissa GRIESHOPs here for a follow up of right breast DCIS. She was last seen by me 6 months ago. She presents to the clinic alone. She notes she has hot flashes. The daytime is worse for her. This does not significantly effect her  sleep. She is on Effexor 121m. She notes her hot flashes were bad with menopause and used Hormonal replacements before. She feels she has gained weight compared to last year. She notes she is active but not with exercise. She notes she has received both her COVID19 vaccine.    REVIEW OF SYSTEMS:   Constitutional: Denies fevers, chills or  abnormal weight loss (+) Manageable hot flashes  Eyes: Denies blurriness of vision Ears, nose, mouth, throat, and face: Denies mucositis or sore throat Respiratory: Denies cough, dyspnea or wheezes Cardiovascular: Denies palpitation, chest discomfort or lower extremity swelling Gastrointestinal:  Denies nausea, heartburn or change in bowel habits Skin: Denies abnormal skin rashes Lymphatics: Denies new lymphadenopathy or easy bruising Neurological:Denies numbness, tingling or new weaknesses Behavioral/Psych: Mood is stable, no new changes  All other systems were reviewed with the patient and are negative.  MEDICAL HISTORY:  Past Medical History:  Diagnosis Date   Acid reflux    GERD   Cancer (HCC) 08,11   melanoma-left knee, right arm   Chronic back pain    Complication of anesthesia    Ductal carcinoma in situ (DCIS) of right breast 10/2017   Family history of adverse reaction to anesthesia    mother has PONV   Family history of colon cancer    Family history of melanoma    Family history of ovarian cancer    Genetic testing 12/23/2017   STAT Breast panel with reflex to Multi-Cancer panel (83 genes) @ [facility] - No pathogenic mutations detected   GERD (gastroesophageal reflux disease)    H/O hiatal hernia    Heart murmur    no problems   Hypertension    PONV (postoperative nausea and vomiting)    "zofran works great   Seasonal allergies    SVD (spontaneous vaginal delivery)    x 1   Vertigo     SURGICAL HISTORY: Past Surgical History:  Procedure Laterality Date   ANTERIOR LAT LUMBAR FUSION Right 04/17/2013   Procedure: ANTERIOR LATERAL LUMBAR FUSION 3 LEVELS;  Surgeon: JErline Levine MD;  [facility];  Service: Neurosurgery;  Laterality: Right;  Right Lumbar Two-Three Lumbar Three-Four Lumbar Four-Five  Anterolateral Fusion   BREAST LUMPECTOMY WITH RADIOACTIVE SEED LOCALIZATION Right 12/25/2017   Procedure: RIGHT BREAST LUMPECTOMY WITH  RADIOACTIVE SEED LOCALIZATION;  Surgeon: IFanny Skates MD;  [place] [facility]  Service: General;  Laterality: Right;   [place]  COLONOSCOPY     COLONOSCOPY WITH PROPOFOL N/A 06/23/2018   Procedure: COLONOSCOPY WITH PROPOFOL;  Surgeon: MClarene Essex MD;  [facility];  Service: Endoscopy;  Laterality: N/A;   CYSTOSCOPY N/A 06/26/2016   Procedure: CYSTOSCOPY;  Surgeon: MMegan Salon MD;  [facility];  Service: Gynecology;  Laterality: N/A;   DILATATION & CURETTAGE/HYSTEROSCOPY WITH MYOSURE N/A 01/24/2016   Procedure: DILATATION & CURETTAGE/HYSTEROSCOPY WITH MYOSURE;  Surgeon: MMegan Salon MD;  [facility];  Service: Gynecology;  Laterality: N/A;   FOOT SURGERY Right    early 20s-bone spur removal   LAPAROSCOPIC HYSTERECTOMY Bilateral 06/26/2016   Procedure: HYSTERECTOMY TOTAL LAPAROSCOPIC;  Surgeon: MMegan Salon MD;  [facility];  Service: Gynecology;  Laterality: Bilateral;   LUMBAR PERCUTANEOUS PEDICLE SCREW 3 LEVEL N/A 04/17/2013   Procedure: LUMBAR PERCUTANEOUS PEDICLE SCREW 3 LEVEL;  Surgeon: JErline Levine MD;  [facility];  Service: Neurosurgery;  Laterality: N/A;  Decompression and Posterior Lumbar Interbody Fusion  of Lumbar Five-Sacral One, Percutaneous Screws Lumbar Two through Sacral One   MELANOMA EXCISION     knee 08 lft. elbow 11rt   SALPINGOOPHORECTOMY Bilateral 06/26/2016   Procedure: SALPINGO OOPHORECTOMY;  Surgeon: Megan Salon, MD;  Location: [facility];  Service: Gynecology;  Laterality: Bilateral;   SHOULDER SURGERY Right 04/28/2015   TUBAL LIGATION  1986   WLE  06/17/07   and left groin lymph node    I have reviewed the social history and family history with the patient and they are unchanged from previous note.  ALLERGIES:  is allergic to sulfur.  MEDICATIONS:  Current Outpatient Medications  Medication Sig Dispense Refill   acetaminophen (TYLENOL) 500 MG  tablet Take 1,000 mg by mouth 2 (two) times daily as needed for mild pain. Reported on 01/23/2016     Ascorbic Acid (VITAMIN C WITH ROSE HIPS) 1000 MG tablet Take 1,000 mg by mouth daily. Reported on 01/23/2016     Calcium-Vitamin D-Vitamin K (VIACTIV CALCIUM PLUS D) 650-12.5-40 MG-MCG-MCG CHEW Chew 1 tablet by mouth daily.     cetirizine (ZYRTEC) 10 MG tablet Take 10 mg by mouth daily. Reported on 01/23/2016     diclofenac (VOLTAREN) 75 MG EC tablet Take 75 mg by mouth daily.     FIBER ADULT GUMMIES PO Take 2 tablets by mouth daily.     HYDROcodone-acetaminophen (NORCO) 5-325 MG tablet Take 1-2 tablets by mouth every 6 (six) hours as needed for moderate pain or severe pain. 20 tablet 0   ibuprofen (ADVIL,MOTRIN) 200 MG tablet Take 400 mg by mouth daily as needed (for pain.).     Lactobacillus Rhamnosus, GG, (CULTURELLE PO) Take 1 capsule by mouth at bedtime.      losartan-hydrochlorothiazide (HYZAAR) 100-12.5 MG per tablet Take 1 tablet by mouth daily. Reported on 01/23/2016     meclizine (ANTIVERT) 25 MG tablet Take 25 mg by mouth 3 (three) times daily as needed (vertigo). Reported on 01/23/2016     methocarbamol (ROBAXIN) 500 MG tablet Take 500 mg by mouth 3 (three) times daily as needed for spasms.  0   Multiple Vitamins-Minerals (ALIVE WOMENS 50+) TABS Take 1 tablet by mouth daily.     ondansetron (ZOFRAN ODT) 4 MG disintegrating tablet 45m ODT q4 hours prn nausea/vomit (Patient taking differently: Take 4 mg by mouth every 4 (four) hours as needed (vertigo). ) 10 tablet 0   pantoprazole (PROTONIX) 40 MG tablet Take 40 mg by mouth daily. Reported on 01/23/2016     polyethylene glycol (MIRALAX / GLYCOLAX) packet Take 17 g by mouth at bedtime. Reported on 01/23/2016     raloxifene (EVISTA) 60 MG tablet Take 1 tablet (60 mg total) by mouth daily. 90 tablet 3   traMADol (ULTRAM) 50 MG tablet Take 50 mg by mouth 2 (two) times daily as needed (for pain).      venlafaxine XR (EFFEXOR XR) 150 MG 24  hr capsule Take 1 capsule (150 mg total) by mouth daily with breakfast. 90 capsule 1   No current facility-administered medications for this visit.    PHYSICAL EXAMINATION: ECOG PERFORMANCE STATUS: 0 - Asymptomatic  Vitals:   05/16/20 1054  BP: (!) 143/88  Pulse: 70  Resp: 17  Temp: 97.8 F (36.6 C)  SpO2: 99%   Filed Weights   05/16/20 1054  Weight: 139 lb 9.6 oz (63.3 kg)    GENERAL:alert, no distress and comfortable SKIN: skin color, texture, turgor are normal (+) Skin rash of diffuse LE  EYES:  normal, Conjunctiva are pink and non-injected, sclera clear  NECK: supple, thyroid normal size, non-tender, without nodularity LYMPH:  no palpable lymphadenopathy in the cervical, axillary  LUNGS: clear to auscultation and percussion with normal breathing effort HEART: regular rate & rhythm and no murmurs and no lower extremity edema ABDOMEN:abdomen soft, non-tender and normal bowel sounds Musculoskeletal:no cyanosis of digits and no clubbing  NEURO: alert & oriented x 3 with fluent speech, no focal motor/sensory deficits BREAST: S/p right lumpectomy: Surgical incision healed well. Left Breast exam benign.   LABORATORY DATA:  I have reviewed the data as listed CBC Latest Ref Rng & Units 05/16/2020 05/15/2019 11/14/2018  WBC 4.0 - 10.5 K/uL 5.2 4.2 4.2  Hemoglobin 12.0 - 15.0 g/dL 13.5 12.7 13.2  Hematocrit 36 - 46 % 39.8 37.7 39.1  Platelets 150 - 400 K/uL 268 244 245     CMP Latest Ref Rng & Units 05/16/2020 05/15/2019 11/14/2018  Glucose 70 - 99 mg/dL 88 72 82  BUN 8 - 23 mg/dL 22 24(H) 19  Creatinine 0.44 - 1.00 mg/dL 0.85 0.79 0.87  Sodium 135 - 145 mmol/L 138 139 142  Potassium 3.5 - 5.1 mmol/L 3.7 4.0 4.1  Chloride 98 - 111 mmol/L 103 102 102  CO2 22 - 32 mmol/L _0 Calcium 8.9 - 10.3 mg/dL 9.9 9.2 9.5  Total Protein 6.5 - 8.1 g/dL 7.0 6.5 6.8  Total Bilirubin 0.3 - 1.2 mg/dL 0.5 0.4 0.4  Alkaline Phos 38 - 126 U/L 66 69 70  AST 15 - 41 U/L _1 ALT 0 - 44  U/L _2 RADIOGRAPHIC STUDIES: I have personally reviewed the radiological images as listed and agreed with the findings in the report. No results found.   ASSESSMENT & PLAN:  Melissa Mcclure is a 64 y.o. female with    1. Ductal carcinoma in situ (DCIS) of right breast, ER/PR Positive, Grade 1 -She was diagnosed in 10/2017. She is s/p right breast lumpectomy and adjuvant radiation.  -She started anti-estrogen therapy with Raloxifene in 02/2018. She is tolerating wellwith manageable hot flasheson higher dose Effexor. -She is clinically doing well. Lab reviewed, her CBC and CMP are within normal limits. Her physical exam and her 11/2019 mammogram were unremarkable. There is no clinical concern for recurrence. -Will hold Raloxifene given hot flashes for 2-3 months. If hot flashes improve she does not have to restart, otherwise she can restart and continue Raloxifene  -Virtual Visit in 6 months  -She has received both her COVID19 vaccine.   2. Genetic Counselingwas negative forpathogenic mutation in any of the genes analyzed.  3. HTN, history of skin melanoma -Follow-up with PCP -She has chronic diffuse skin rash of her LE. She will continue f/u with her Dermatologist.   4. Hot Flashes  -She knows to avoid hormonal replacement. Gabapentindid not work well enough for her.This is worse in the day and does not significantly effect her sleep.  -Improved and controlled on 186m effexor. Will continue -I discussed it is possible her hot flashes will continue if she stops Raloxifene. This may also be related to her menopause. She is willing to hold Raloxifene for 2-3 months to see how she responds. She is agreeable.  -If she does not restart Raloxifene she can wean herself off Effexor.   5. Bone health  -[facility] lowest T-Score of -0.4 at left femur neck. Her 12/10/19 was  stable (T-score -0.5).  -She is on Raloxifene which strengthens her  bone.  6. Osteoarthritis  -Mainly in hands, back and neck  -She notes her basal joint arthritis in her hands have progressed and may have surgery on it.    PLAN: -Hold Raloxifenefor up to 2 months, if hot flush does not improve, she will restart Raloxifene  -Refill E[facility] [place]  -Virtual visit in 6 months     No problem-specific Assessment & Plan notes found for this encounter.   No orders of the defined types were placed in this encounter.  All questions were answered. The patient knows to call the clinic with any problems, questions or concerns. No barriers to learning was detected. The total time spent in the appointment was 25 minutes.     Truitt Merle, MD 05/17/2020   I, Joslyn Devon, am acting as scribe for Truitt Merle, MD.   I have reviewed the above documentation for accuracy and completeness, and I agree with the above.

## 2020-05-16 ENCOUNTER — Telehealth: Payer: Self-pay | Admitting: Hematology

## 2020-05-16 ENCOUNTER — Inpatient Hospital Stay: Payer: BC Managed Care – PPO | Attending: Hematology

## 2020-05-16 ENCOUNTER — Encounter: Payer: Self-pay | Admitting: Hematology

## 2020-05-16 ENCOUNTER — Inpatient Hospital Stay (HOSPITAL_BASED_OUTPATIENT_CLINIC_OR_DEPARTMENT_OTHER): Payer: BC Managed Care – PPO | Admitting: Hematology

## 2020-05-16 ENCOUNTER — Other Ambulatory Visit: Payer: Self-pay

## 2020-05-16 VITALS — BP 143/88 | HR 70 | Temp 97.8°F | Resp 17 | Ht 62.5 in | Wt 139.6 lb

## 2020-05-16 DIAGNOSIS — R232 Flushing: Secondary | ICD-10-CM | POA: Insufficient documentation

## 2020-05-16 DIAGNOSIS — Z923 Personal history of irradiation: Secondary | ICD-10-CM | POA: Diagnosis not present

## 2020-05-16 DIAGNOSIS — Z79811 Long term (current) use of aromatase inhibitors: Secondary | ICD-10-CM | POA: Insufficient documentation

## 2020-05-16 DIAGNOSIS — R21 Rash and other nonspecific skin eruption: Secondary | ICD-10-CM | POA: Insufficient documentation

## 2020-05-16 DIAGNOSIS — D0511 Intraductal carcinoma in situ of right breast: Secondary | ICD-10-CM | POA: Diagnosis not present

## 2020-05-16 DIAGNOSIS — N951 Menopausal and female climacteric states: Secondary | ICD-10-CM | POA: Insufficient documentation

## 2020-05-16 DIAGNOSIS — Z170 Estrogen receptor positive status [ER+]: Secondary | ICD-10-CM | POA: Insufficient documentation

## 2020-05-16 DIAGNOSIS — I10 Essential (primary) hypertension: Secondary | ICD-10-CM | POA: Diagnosis not present

## 2020-05-16 DIAGNOSIS — M1990 Unspecified osteoarthritis, unspecified site: Secondary | ICD-10-CM | POA: Insufficient documentation

## 2020-05-16 DIAGNOSIS — Z79899 Other long term (current) drug therapy: Secondary | ICD-10-CM | POA: Insufficient documentation

## 2020-05-16 DIAGNOSIS — Z85820 Personal history of malignant melanoma of skin: Secondary | ICD-10-CM | POA: Diagnosis not present

## 2020-05-16 LAB — CBC WITH DIFFERENTIAL (CANCER CENTER ONLY)
Abs Immature Granulocytes: 0.01 10*3/uL (ref 0.00–0.07)
Basophils Absolute: 0.1 10*3/uL (ref 0.0–0.1)
Basophils Relative: 1 %
Eosinophils Absolute: 0.1 10*3/uL (ref 0.0–0.5)
Eosinophils Relative: 3 %
HCT: 39.8 % (ref 36.0–46.0)
Hemoglobin: 13.5 g/dL (ref 12.0–15.0)
Immature Granulocytes: 0 %
Lymphocytes Relative: 33 %
Lymphs Abs: 1.7 10*3/uL (ref 0.7–4.0)
MCH: 31.8 pg (ref 26.0–34.0)
MCHC: 33.9 g/dL (ref 30.0–36.0)
MCV: 93.6 fL (ref 80.0–100.0)
Monocytes Absolute: 0.6 10*3/uL (ref 0.1–1.0)
Monocytes Relative: 11 %
Neutro Abs: 2.7 10*3/uL (ref 1.7–7.7)
Neutrophils Relative %: 52 %
Platelet Count: 268 10*3/uL (ref 150–400)
RBC: 4.25 MIL/uL (ref 3.87–5.11)
RDW: 12.5 % (ref 11.5–15.5)
WBC Count: 5.2 10*3/uL (ref 4.0–10.5)
nRBC: 0 % (ref 0.0–0.2)

## 2020-05-16 LAB — CMP (CANCER CENTER ONLY)
ALT: 22 U/L (ref 0–44)
AST: 23 U/L (ref 15–41)
Albumin: 4.1 g/dL (ref 3.5–5.0)
Alkaline Phosphatase: 66 U/L (ref 38–126)
Anion gap: 8 (ref 5–15)
BUN: 22 mg/dL (ref 8–23)
CO2: 27 mmol/L (ref 22–32)
Calcium: 9.9 mg/dL (ref 8.9–10.3)
Chloride: 103 mmol/L (ref 98–111)
Creatinine: 0.85 mg/dL (ref 0.44–1.00)
GFR, Est AFR Am: >60 mL/min (ref >60)
GFR, Estimated: >60 mL/min (ref >60)
Glucose, Bld: 88 mg/dL (ref 70–99)
Potassium: 3.7 mmol/L (ref 3.5–5.1)
Sodium: 138 mmol/L (ref 135–145)
Total Bilirubin: 0.5 mg/dL (ref 0.3–1.2)
Total Protein: 7 g/dL (ref 6.5–8.1)

## 2020-05-16 NOTE — Telephone Encounter (Signed)
Scheduled per los. Gave avs and calendar  

## 2020-05-17 ENCOUNTER — Encounter: Payer: Self-pay | Admitting: Hematology

## 2020-05-17 MED ORDER — VENLAFAXINE HCL ER 150 MG PO CP24: 150.0000 mg | ORAL_CAPSULE | Freq: Every day | ORAL | 1 refills | Status: DC

## 2020-05-26 DIAGNOSIS — Z20822 Contact with and (suspected) exposure to covid-19: Secondary | ICD-10-CM | POA: Diagnosis not present

## 2020-06-16 DIAGNOSIS — H5010 Unspecified exotropia: Secondary | ICD-10-CM | POA: Diagnosis not present

## 2020-06-23 NOTE — Progress Notes (Addendum)
64 y.o. G3P3 Married White or Caucasian female here for annual exam.  I am following her for DCIS.  She is on Raloxifene.  She was on HRT but stopped after diagnosis.  She continues to have severe hot flashes.  Is on venlafaxine.  Not sure if this is helping.  She did stop the raloxifene for two months to see if this helped.  It did not.  She and I have discussed gabapentin.  Didn't want to try it but is willing todsy    Had Covid in early September.  They got it from someone doing work on their [facility] in their home who didn't wear a mask.  Pt had it but it was very mild.  She'd been vaccination.    Denies vaginal bleeding.  H/o LTH/BSO 10/17.  Has skin check in December at [facility].  Patient's last menstrual period was 06/24/2008.  S/p hysterectomy. Sexually active: Yes.    H/O STD:  no  Health Maintenance: PCP:  Maury Dus  Last wellness appt was 01/2020  Did blood work at that appt:  Yes.  Reviewed with pt today through portal. Vaccines are up to date: yes, per pt Colonoscopy:  06-23-18 f/u 48yrs MMG:  12-10-2019 category c density birads 2:neg BMD:  12-10-2019 Last pap smear:  12-30-2015 neg HPV HR neg H/o abnormal pap smear:  no   reports that she has never smoked. She has never used smokeless tobacco. She reports current alcohol use. She reports that she does not use drugs.  Past Medical History:  Diagnosis Date  . Acid reflux    GERD  . Cancer ([place]) 08,11   melanoma-left knee, right arm  . Chronic back pain   . Complication of anesthesia   . Ductal carcinoma in situ (DCIS) of right breast 10/2017  . Family history of adverse reaction to anesthesia    mother has PONV  . Family history of colon cancer   . Family history of melanoma   . Family history of ovarian cancer   . Genetic testing 12/23/2017   STAT Breast panel with reflex to Multi-Cancer panel (83 genes) @ [facility] - No pathogenic mutations detected  . GERD (gastroesophageal reflux disease)   . H/O hiatal hernia   . Heart  murmur    no problems  . History of hand surgery   . Hypertension   . PONV (postoperative nausea and vomiting)    "zofran works great  . Seasonal allergies   . SVD (spontaneous vaginal delivery)    x 1  . Vertigo     Past Surgical History:  Procedure Laterality Date  . ANTERIOR LAT LUMBAR FUSION Right 04/17/2013   Procedure: ANTERIOR LATERAL LUMBAR FUSION 3 LEVELS;  Surgeon: Erline Levine, MD;  Location: [place] [facility];  Service: Neurosurgery;  Laterality: Right;  Right Lumbar Two-Three Lumbar Three-Four Lumbar Four-Five  Anterolateral Fusion  . BREAST LUMPECTOMY WITH RADIOACTIVE SEED LOCALIZATION Right 12/25/2017   Procedure: RIGHT BREAST LUMPECTOMY WITH RADIOACTIVE SEED LOCALIZATION;  Surgeon: Fanny Skates, MD;  Location: [place];  Service: General;  Laterality: Right;  . [place]  . CESAREAN SECTION  1980  . COLONOSCOPY    . COLONOSCOPY WITH PROPOFOL N/A 06/23/2018   Procedure: COLONOSCOPY WITH PROPOFOL;  Surgeon: Clarene Essex, MD;  [facility];  Service: Endoscopy;  Laterality: N/A;  . CYSTOSCOPY N/A 06/26/2016   Procedure: CYSTOSCOPY;  Surgeon: Megan Salon, MD;  Location: [facility] [facility];  Service: Gynecology;  Laterality: N/A;  .  DILATATION & CURETTAGE/HYSTEROSCOPY WITH MYOSURE N/A 01/24/2016   Procedure: DILATATION & CURETTAGE/HYSTEROSCOPY WITH MYOSURE;  Surgeon: Megan Salon, MD;  Location: [facility];  Service: Gynecology;  Laterality: N/A;  . FOOT SURGERY Right    early 20s-bone spur removal  . LAPAROSCOPIC HYSTERECTOMY Bilateral 06/26/2016   Procedure: HYSTERECTOMY TOTAL LAPAROSCOPIC;  Surgeon: Megan Salon, MD;  Location: [facility] [facility];  Service: Gynecology;  Laterality: Bilateral;  . LUMBAR PERCUTANEOUS PEDICLE SCREW 3 LEVEL N/A 04/17/2013   Procedure: LUMBAR PERCUTANEOUS PEDICLE SCREW 3 LEVEL;  Surgeon: Erline Levine, MD;  Location: [place] [facility];  Service: Neurosurgery;  Laterality: N/A;  Decompression and Posterior Lumbar Interbody Fusion of Lumbar  Five-Sacral One, Percutaneous Screws Lumbar Two through Sacral One  . MELANOMA EXCISION     knee 08 lft. elbow 11rt  . SALPINGOOPHORECTOMY Bilateral 06/26/2016   Procedure: SALPINGO OOPHORECTOMY;  Surgeon: Megan Salon, MD;  Location: [facility];  Service: Gynecology;  Laterality: Bilateral;  . SHOULDER SURGERY Right 04/28/2015  . TUBAL LIGATION  1986  . WLE  06/17/07   and left groin lymph node    Current Outpatient Medications  Medication Sig Dispense Refill  . acetaminophen (TYLENOL) 500 MG tablet Take 1,000 mg by mouth 2 (two) times daily as needed for mild pain. Reported on 01/23/2016    . Ascorbic Acid (VITAMIN C WITH ROSE HIPS) 1000 MG tablet Take 1,000 mg by mouth daily. Reported on 01/23/2016    . Calcium-Vitamin D-Vitamin K (VIACTIV CALCIUM PLUS D) 650-12.5-40 MG-MCG-MCG CHEW Chew 1 tablet by mouth daily.    . cetirizine (ZYRTEC) 10 MG tablet Take 10 mg by mouth daily. Reported on 01/23/2016    . diclofenac (VOLTAREN) 75 MG EC tablet Take 75 mg by mouth daily.    Marland Kitchen ibuprofen (ADVIL,MOTRIN) 200 MG tablet Take 400 mg by mouth daily as needed (for pain.).    Marland Kitchen Lactobacillus Rhamnosus, GG, (CULTURELLE PO) Take 1 capsule by mouth at bedtime.     Marland Kitchen losartan-hydrochlorothiazide (HYZAAR) 100-12.5 MG per tablet Take 1 tablet by mouth daily. Reported on 01/23/2016    . meclizine (ANTIVERT) 25 MG tablet Take 25 mg by mouth 3 (three) times daily as needed (vertigo). Reported on 01/23/2016    . Multiple Vitamins-Minerals (ALIVE WOMENS 50+) TABS Take 1 tablet by mouth daily.    . pantoprazole (PROTONIX) 40 MG tablet Take 40 mg by mouth daily. Reported on 01/23/2016    . polyethylene glycol (MIRALAX / GLYCOLAX) packet Take 17 g by mouth at bedtime. Reported on 01/23/2016    . traMADol (ULTRAM) 50 MG tablet Take 50 mg by mouth 2 (two) times daily as needed (for pain).     Marland Kitchen venlafaxine XR (EFFEXOR XR) 150 MG 24 hr capsule Take 1 capsule (150 mg total) by mouth daily with breakfast. 90 capsule 1  . ondansetron  (ZOFRAN ODT) 4 MG disintegrating tablet 4mg  ODT q4 hours prn nausea/vomit (Patient not taking: Reported on 06/24/2020) 10 tablet 0  . raloxifene (EVISTA) 60 MG tablet Take 1 tablet (60 mg total) by mouth daily. (Patient not taking: Reported on 06/24/2020) 90 tablet 3   No current facility-administered medications for this visit.    Family History  Problem Relation Age of Onset  . Diabetes Father        borderline  . Melanoma Father        dx 29s/60s; deceased 8s  . Congestive Heart Failure Father   . Colon cancer Mother        dx  34s; deceased 61  . Hypertension Mother   . Osteoporosis Mother   . Ovarian cancer Maternal Grandmother        deceased 23s  . Heart disease Paternal Grandfather   . Melanoma Daughter        on leg; dx 56s    Review of Systems  Constitutional: Negative.   HENT: Negative.   Eyes: Negative.   Respiratory: Negative.   Cardiovascular: Negative.   Gastrointestinal: Negative.   Endocrine: Negative.   Genitourinary: Negative.   Musculoskeletal: Negative.   Skin: Negative.   Allergic/Immunologic: Negative.   Neurological: Negative.   Hematological: Negative.   Psychiatric/Behavioral: Negative.     Exam:   BP 104/64   Pulse 64   Resp 16   Ht 5' 2.25" (1.581 m)   Wt 145 lb (65.8 kg)   LMP 06/24/2008   BMI 26.31 kg/m   Height: 5' 2.25" (158.1 cm)  General appearance: alert, cooperative and appears stated age CV:  Regular rate and rhythm without murmurs, rubs, or gallops Lungs:  CTA bilateral without abnormal breath sounds Breasts: normal appearance, no masses or tenderness, no LAD or skin changes Abdomen: soft, non-tender; bowel sounds normal; no masses,  no organomegaly Lymph nodes: Cervical, supraclavicular, and axillary nodes normal.  No abnormal inguinal nodes palpated Skin:  Significant sun damage changes, no abnormal lesions noted today Neurologic: Grossly normal  Pelvic: External genitalia:  no lesions              Urethra:  normal  appearing urethra with no masses, tenderness or lesions              Bartholins and Skenes: normal                 Vagina: normal appearing vagina with normal color and discharge, no lesions              Cervix: absent              Pap taken: No. Bimanual Exam:  Uterus:  normal size, contour, position, consistency, mobility, non-tender              Adnexa: no mass, fullness, tenderness               Rectovaginal: Confirms               Anus:  normal sphincter tone, no lesions  Chaperone, Royal Hawthorn, CMA, was present for exam.  A:  Annual gyn exam H/o DCIS PMP, off HRT TLH/BSO, cystoscopy due to persistent PMP bleeding H/o melanoma [facility] Hypertension followed by Dr. Alyson Ingles Continued vasomotor symptoms  P:   Mammogram guidelines reviewed.  Doing yearly. Pt is aware I don't think the Raloxifene is causing hot flashes pap smear not indicated Colonoscopy 2019, follow up 5 years BMD done 2021 Vaccines reviewed Will start trial of gabapentin 100mg  nightly.  Can increase to 200mg  nighty.  Pt will give update.  Rx to pharmacy. Lab work done with Dr. Burnetta Sabin Follow up 1 year.

## 2020-06-24 ENCOUNTER — Other Ambulatory Visit: Payer: Self-pay

## 2020-06-24 ENCOUNTER — Encounter: Payer: Self-pay | Admitting: Obstetrics & Gynecology

## 2020-06-24 ENCOUNTER — Ambulatory Visit (INDEPENDENT_AMBULATORY_CARE_PROVIDER_SITE_OTHER): Payer: BC Managed Care – PPO | Admitting: Obstetrics & Gynecology

## 2020-06-24 VITALS — BP 104/64 | HR 64 | Resp 16 | Ht 62.25 in | Wt 145.0 lb

## 2020-06-24 DIAGNOSIS — Z01419 Encounter for gynecological examination (general) (routine) without abnormal findings: Secondary | ICD-10-CM | POA: Diagnosis not present

## 2020-06-24 DIAGNOSIS — R232 Flushing: Secondary | ICD-10-CM

## 2020-06-24 DIAGNOSIS — D0510 Intraductal carcinoma in situ of unspecified breast: Secondary | ICD-10-CM

## 2020-06-24 DIAGNOSIS — Z9289 Personal history of other medical treatment: Secondary | ICD-10-CM

## 2020-06-24 MED ORDER — GABAPENTIN 100 MG PO CAPS: 100.0000 mg | ORAL_CAPSULE | Freq: Every day | ORAL | 0 refills | Status: DC

## 2020-06-27 NOTE — Addendum Note (Signed)
Addended by: Megan Salon on: 06/27/2020 10:34 AM   Modules accepted: Level of Service

## 2020-07-12 DIAGNOSIS — H5034 Intermittent alternating exotropia: Secondary | ICD-10-CM | POA: Diagnosis not present

## 2020-07-14 ENCOUNTER — Ambulatory Visit: Payer: Self-pay | Admitting: Ophthalmology

## 2020-07-18 DIAGNOSIS — H5034 Intermittent alternating exotropia: Secondary | ICD-10-CM | POA: Diagnosis not present

## 2020-07-20 ENCOUNTER — Telehealth: Payer: Self-pay

## 2020-07-20 NOTE — Telephone Encounter (Signed)
Spoke with patient, advised as seen below per Dr. Sabra Heck.   1. Patient would like to try increasing to 300mg  gabapentin nightly, she has enough of her current RX to do this for 5 nights if Dr. Sabra Heck ok to increase.   2. OK to restart Raloxifene and increase gabapentin?   3. Does she need to reduce effexor if she increases gabapentin?   Routing to Dr. Sabra Heck

## 2020-07-20 NOTE — Telephone Encounter (Signed)
Patient would like to speak with nurse regarding her gabapentin prescription. She also has a question regarding acupuncture and hot flashes.

## 2020-07-20 NOTE — Telephone Encounter (Signed)
Spoke with patient. Patient calling to provide update on gabapentin started on 06/24/20 for hot flashes. She has increased to 200 mg nightly, has been on this dosage for almost 3 wks now, no change in hot flashes. She states she is not sure that she wants to increase to 300 mg daily, has been on gabapentin in the past, does not want to be on increased dosage long term.   1. Patient is asking if Dr. Sabra Heck has any suggestions or feedback on acupuncture for hot flashes?   2. Should she go back on Raloxifene 60mg  tab?   Advised patient I will update Dr. Sabra Heck, our office will f/u with recommendations. Patient agreeable.   Dr. Sabra Heck -please advise.

## 2020-07-20 NOTE — Telephone Encounter (Signed)
1)  Yes, she should start back on her Raloxifene.  She is on this in place of another medication to prevent breast cancer recurrent.  She stopped this just to see if it would help the hot flashes.  It hasn't.    2)  It's fine to not go up in the Gapapentin.  She did use it for back issues in the past so knows how higher doses made her feel.  3)  I think acupunture is a good idea.  It doesn't hurt (I've had it done).  I don't know if it will help but worth a try.  I do not have any good recommendations.  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  Phone [phone].  She's just a suggestion if Melissa Mcclure does not know someone who does acupunture.

## 2020-07-22 ENCOUNTER — Other Ambulatory Visit: Payer: Self-pay

## 2020-07-22 ENCOUNTER — Encounter (HOSPITAL_BASED_OUTPATIENT_CLINIC_OR_DEPARTMENT_OTHER): Payer: Self-pay | Admitting: Ophthalmology

## 2020-07-22 NOTE — Telephone Encounter (Signed)
1)  yes fine to to go the 300mg  of gabapentin  2) she can restart the raloxifene now  3) she does not need to make any changes to the effexor.    Thanks.

## 2020-07-22 NOTE — Telephone Encounter (Signed)
Spoke with pt. Pt given update and recommendations per Dr Sabra Heck. Pt agreeable and verbalized understanding. Pt to return call on Monday with update after starting 300 mg gabapentin tonight. Pt states if 300 mg nightly gabapentin is good, then will need refills.   Pt agreeable to plan of care.

## 2020-07-25 NOTE — Telephone Encounter (Signed)
Spoke with patient. Patient calling to provide update. She increased gabapentin to 300 mg nightly on 10/29, reports "maybe a slight bit of improvement", would like to continue for a little longer. Requesting RX for 300 mg cap for 30 day supply to [facility] on file. She has restarted Raloxifene.   Advised patient I will update Dr. Sabra Heck and return call if any additional recommendations, patient agreeable.   Rx pended for gabapentin 300 mg cap, take 1 cap PO qhs. #30/0RF  Routing to Dr. Sabra Heck to review.

## 2020-07-25 NOTE — Telephone Encounter (Signed)
Patient calling to give an update on her gabapentin.

## 2020-07-26 ENCOUNTER — Encounter (HOSPITAL_BASED_OUTPATIENT_CLINIC_OR_DEPARTMENT_OTHER)
Admission: RE | Admit: 2020-07-26 | Discharge: 2020-07-26 | Disposition: A | Discharge status: Home / Self Care | Payer: BC Managed Care – PPO | Source: Ambulatory Visit | Attending: Ophthalmology | Admitting: Ophthalmology

## 2020-07-26 ENCOUNTER — Other Ambulatory Visit: Payer: Self-pay | Admitting: Obstetrics & Gynecology

## 2020-07-26 ENCOUNTER — Other Ambulatory Visit (HOSPITAL_COMMUNITY): Payer: BC Managed Care – PPO

## 2020-07-26 DIAGNOSIS — Z01818 Encounter for other preprocedural examination: Secondary | ICD-10-CM | POA: Diagnosis not present

## 2020-07-26 DIAGNOSIS — I10 Essential (primary) hypertension: Secondary | ICD-10-CM | POA: Diagnosis not present

## 2020-07-26 LAB — BASIC METABOLIC PANEL
Anion gap: 8 (ref 5–15)
BUN: 18 mg/dL (ref 8–23)
CO2: 29 mmol/L (ref 22–32)
Calcium: 9.4 mg/dL (ref 8.9–10.3)
Chloride: 103 mmol/L (ref 98–111)
Creatinine, Ser: 0.68 mg/dL (ref 0.44–1.00)
GFR, Estimated: >60 mL/min (ref >60)
Glucose, Bld: 85 mg/dL (ref 70–99)
Potassium: 4.2 mmol/L (ref 3.5–5.1)
Sodium: 140 mmol/L (ref 135–145)

## 2020-07-26 MED ORDER — GABAPENTIN 300 MG PO CAPS
300.0000 mg | ORAL_CAPSULE | Freq: Every day | ORAL | 3 refills | Status: DC
Start: 2020-07-26 — End: 2021-02-13

## 2020-07-28 NOTE — Anesthesia Preprocedure Evaluation (Addendum)
Anesthesia Evaluation  Patient identified by MRN, date of birth, ID band Patient awake    Reviewed: Allergy & Precautions, NPO status , Patient's Chart, lab work & pertinent test results  History of Anesthesia Complications (+) PONV and history of anesthetic complications  Airway Mallampati: II  TM Distance: >3 FB Neck ROM: Full    Dental no notable dental hx. (+) Dental Advisory Given   Pulmonary neg pulmonary ROS,    Pulmonary exam normal        Cardiovascular hypertension, Pt. on medications Normal cardiovascular exam  ECG: SB, rate 57   Neuro/Psych PSYCHIATRIC DISORDERS Vertigo    GI/Hepatic Neg liver ROS, hiatal hernia, GERD  Medicated and Controlled,  Endo/Other  Breast cancer  Renal/GU negative Renal ROS     Musculoskeletal   Abdominal   Peds  Hematology negative hematology ROS (+)   Anesthesia Other Findings family hx of colon cancer  Reproductive/Obstetrics                            Anesthesia Physical  Anesthesia Plan  ASA: II  Anesthesia Plan: General   Post-op Pain Management:    Induction: Intravenous  PONV Risk Score and Plan: 4 or greater and Ondansetron, Dexamethasone, Midazolam and Scopolamine patch - Pre-op  Airway Management Planned: Oral ETT and LMA  Additional Equipment:   Intra-op Plan:   Post-operative Plan: Extubation in OR  Informed Consent: I have reviewed the patients History and Physical, chart, labs and discussed the procedure including the risks, benefits and alternatives for the proposed anesthesia with the patient or authorized representative who has indicated his/her understanding and acceptance.     Dental advisory given  Plan Discussed with: Anesthesiologist, CRNA and Surgeon  Anesthesia Plan Comments:        Anesthesia Quick Evaluation

## 2020-07-29 ENCOUNTER — Encounter (HOSPITAL_BASED_OUTPATIENT_CLINIC_OR_DEPARTMENT_OTHER): Payer: Self-pay | Admitting: Ophthalmology

## 2020-07-29 ENCOUNTER — Ambulatory Visit (HOSPITAL_BASED_OUTPATIENT_CLINIC_OR_DEPARTMENT_OTHER): Payer: BC Managed Care – PPO | Admitting: Anesthesiology

## 2020-07-29 ENCOUNTER — Ambulatory Visit: Payer: Self-pay | Admitting: Ophthalmology

## 2020-07-29 ENCOUNTER — Ambulatory Visit (HOSPITAL_BASED_OUTPATIENT_CLINIC_OR_DEPARTMENT_OTHER)
Admission: RE | Admit: 2020-07-29 | Discharge: 2020-07-29 | Disposition: A | Discharge status: Home / Self Care | Payer: BC Managed Care – PPO | Source: Ambulatory Visit | Attending: Ophthalmology | Admitting: Ophthalmology

## 2020-07-29 ENCOUNTER — Encounter (HOSPITAL_BASED_OUTPATIENT_CLINIC_OR_DEPARTMENT_OTHER)
Admission: RE | Disposition: A | Discharge status: Home / Self Care | Payer: Self-pay | Source: Ambulatory Visit | Attending: Ophthalmology

## 2020-07-29 ENCOUNTER — Other Ambulatory Visit: Payer: Self-pay

## 2020-07-29 DIAGNOSIS — H532 Diplopia: Secondary | ICD-10-CM | POA: Diagnosis not present

## 2020-07-29 DIAGNOSIS — J302 Other seasonal allergic rhinitis: Secondary | ICD-10-CM | POA: Diagnosis not present

## 2020-07-29 DIAGNOSIS — H5111 Convergence insufficiency: Secondary | ICD-10-CM | POA: Diagnosis not present

## 2020-07-29 DIAGNOSIS — K449 Diaphragmatic hernia without obstruction or gangrene: Secondary | ICD-10-CM | POA: Diagnosis not present

## 2020-07-29 DIAGNOSIS — I10 Essential (primary) hypertension: Secondary | ICD-10-CM | POA: Diagnosis not present

## 2020-07-29 DIAGNOSIS — H5010 Unspecified exotropia: Secondary | ICD-10-CM | POA: Insufficient documentation

## 2020-07-29 DIAGNOSIS — H5034 Intermittent alternating exotropia: Secondary | ICD-10-CM | POA: Diagnosis not present

## 2020-07-29 HISTORY — PX: STRABISMUS SURGERY: SHX218

## 2020-07-29 SURGERY — REPAIR STRABISMUS
Anesthesia: General | Site: Eye | Laterality: Bilateral

## 2020-07-29 MED ORDER — DIPHENHYDRAMINE HCL 50 MG/ML IJ SOLN
INTRAMUSCULAR | Status: AC
  Filled 2020-07-29: qty 1

## 2020-07-29 MED ORDER — ONDANSETRON HCL 4 MG/2ML IJ SOLN
INTRAMUSCULAR | Status: AC
  Filled 2020-07-29: qty 2

## 2020-07-29 MED ORDER — KETOROLAC TROMETHAMINE 30 MG/ML IJ SOLN
INTRAMUSCULAR | Status: DC | PRN
  Administered 2020-07-29: 15 mg via INTRAVENOUS

## 2020-07-29 MED ORDER — ONDANSETRON HCL 4 MG/2ML IJ SOLN
INTRAMUSCULAR | Status: DC | PRN
  Administered 2020-07-29: 4 mg via INTRAVENOUS

## 2020-07-29 MED ORDER — FENTANYL CITRATE (PF) 100 MCG/2ML IJ SOLN
INTRAMUSCULAR | Status: AC
  Filled 2020-07-29: qty 2

## 2020-07-29 MED ORDER — EPHEDRINE 5 MG/ML INJ
INTRAVENOUS | Status: AC
  Filled 2020-07-29: qty 10

## 2020-07-29 MED ORDER — FENTANYL CITRATE (PF) 100 MCG/2ML IJ SOLN
25.0000 ug | INTRAMUSCULAR | Status: DC | PRN
  Administered 2020-07-29: 25 ug via INTRAVENOUS

## 2020-07-29 MED ORDER — KETOROLAC TROMETHAMINE 30 MG/ML IJ SOLN
INTRAMUSCULAR | Status: AC
  Filled 2020-07-29: qty 1

## 2020-07-29 MED ORDER — TOBRAMYCIN-DEXAMETHASONE 0.3-0.1 % OP OINT
TOPICAL_OINTMENT | OPHTHALMIC | Status: DC | PRN
  Administered 2020-07-29: 1 via OPHTHALMIC

## 2020-07-29 MED ORDER — BSS IO SOLN
INTRAOCULAR | Status: AC
  Filled 2020-07-29: qty 15

## 2020-07-29 MED ORDER — LIDOCAINE 2% (20 MG/ML) 5 ML SYRINGE
INTRAMUSCULAR | Status: AC
  Filled 2020-07-29: qty 5

## 2020-07-29 MED ORDER — LIDOCAINE HCL (CARDIAC) PF 100 MG/5ML IV SOSY
PREFILLED_SYRINGE | INTRAVENOUS | Status: DC | PRN
  Administered 2020-07-29: 100 mg via INTRAVENOUS

## 2020-07-29 MED ORDER — DEXAMETHASONE SODIUM PHOSPHATE 10 MG/ML IJ SOLN
INTRAMUSCULAR | Status: AC
  Filled 2020-07-29: qty 1

## 2020-07-29 MED ORDER — SCOPOLAMINE 1 MG/3DAYS TD PT72
1.0000 | MEDICATED_PATCH | TRANSDERMAL | Status: DC
  Administered 2020-07-29: 1.5 mg via TRANSDERMAL

## 2020-07-29 MED ORDER — BUPIVACAINE HCL (PF) 0.25 % IJ SOLN
INTRAMUSCULAR | Status: AC
  Filled 2020-07-29: qty 90

## 2020-07-29 MED ORDER — METHYLENE BLUE 0.5 % INJ SOLN
INTRAVENOUS | Status: AC
  Filled 2020-07-29: qty 20

## 2020-07-29 MED ORDER — ACETAMINOPHEN 500 MG PO TABS
ORAL_TABLET | ORAL | Status: AC
  Filled 2020-07-29: qty 2

## 2020-07-29 MED ORDER — SODIUM CHLORIDE (PF) 0.9 % IJ SOLN
INTRAMUSCULAR | Status: AC
  Filled 2020-07-29: qty 20

## 2020-07-29 MED ORDER — DEXAMETHASONE SODIUM PHOSPHATE 4 MG/ML IJ SOLN
INTRAMUSCULAR | Status: DC | PRN
  Administered 2020-07-29: 10 mg via INTRAVENOUS

## 2020-07-29 MED ORDER — SUCCINYLCHOLINE CHLORIDE 200 MG/10ML IV SOSY
PREFILLED_SYRINGE | INTRAVENOUS | Status: AC
  Filled 2020-07-29: qty 10

## 2020-07-29 MED ORDER — TOBRAMYCIN-DEXAMETHASONE 0.3-0.1 % OP OINT
TOPICAL_OINTMENT | OPHTHALMIC | Status: AC
  Filled 2020-07-29: qty 3.5

## 2020-07-29 MED ORDER — MIDAZOLAM HCL 5 MG/5ML IJ SOLN
INTRAMUSCULAR | Status: DC | PRN
  Administered 2020-07-29: 1 mg via INTRAVENOUS

## 2020-07-29 MED ORDER — ACETAMINOPHEN 500 MG PO TABS
1000.0000 mg | ORAL_TABLET | Freq: Once | ORAL | Status: AC
  Administered 2020-07-29: 1000 mg via ORAL

## 2020-07-29 MED ORDER — PHENYLEPHRINE 40 MCG/ML (10ML) SYRINGE FOR IV PUSH (FOR BLOOD PRESSURE SUPPORT)
PREFILLED_SYRINGE | INTRAVENOUS | Status: AC
  Filled 2020-07-29: qty 10

## 2020-07-29 MED ORDER — FENTANYL CITRATE (PF) 100 MCG/2ML IJ SOLN
INTRAMUSCULAR | Status: DC | PRN
  Administered 2020-07-29: 50 ug via INTRAVENOUS
  Administered 2020-07-29: 100 ug via INTRAVENOUS

## 2020-07-29 MED ORDER — MIDAZOLAM HCL 2 MG/2ML IJ SOLN
INTRAMUSCULAR | Status: AC
  Filled 2020-07-29: qty 2

## 2020-07-29 MED ORDER — DIPHENHYDRAMINE HCL 50 MG/ML IJ SOLN
INTRAMUSCULAR | Status: DC | PRN
  Administered 2020-07-29: 6.25 mg via INTRAVENOUS

## 2020-07-29 MED ORDER — PROPOFOL 10 MG/ML IV BOLUS
INTRAVENOUS | Status: DC | PRN
  Administered 2020-07-29: 150 mg via INTRAVENOUS

## 2020-07-29 MED ORDER — PROMETHAZINE HCL 25 MG/ML IJ SOLN: 6.2500 mg | INTRAMUSCULAR | Status: DC | PRN

## 2020-07-29 MED ORDER — LACTATED RINGERS IV SOLN: INTRAVENOUS | Status: DC

## 2020-07-29 MED ORDER — SCOPOLAMINE 1 MG/3DAYS TD PT72
MEDICATED_PATCH | TRANSDERMAL | Status: AC
  Filled 2020-07-29: qty 1

## 2020-07-29 SURGICAL SUPPLY — 36 items
APL SRG 3 HI ABS STRL LF PLS (MISCELLANEOUS) ×1
APL SWBSTK 6 STRL LF DISP (MISCELLANEOUS) ×4
APPLICATOR COTTON TIP 6 STRL (MISCELLANEOUS) ×4 IMPLANT
APPLICATOR COTTON TIP 6IN STRL (MISCELLANEOUS) ×12
APPLICATOR DR MATTHEWS STRL (MISCELLANEOUS) ×3 IMPLANT
BNDG EYE OVAL (GAUZE/BANDAGES/DRESSINGS) IMPLANT
CAUTERY EYE LOW TEMP 1300F FIN (OPHTHALMIC RELATED) IMPLANT
CLOSURE WOUND 1/4X4 (GAUZE/BANDAGES/DRESSINGS)
COVER BACK TABLE 60X90IN (DRAPES) ×3 IMPLANT
COVER MAYO STAND STRL (DRAPES) ×3 IMPLANT
COVER WAND RF STERILE (DRAPES) IMPLANT
DRAPE SURG 17X23 STRL (DRAPES) ×6 IMPLANT
DRAPE U-SHAPE 76X120 STRL (DRAPES) ×2 IMPLANT
GLOVE BIO SURGEON STRL SZ 6.5 (GLOVE) ×4 IMPLANT
GLOVE BIO SURGEONS STRL SZ 6.5 (GLOVE) ×2
GLOVE BIOGEL M STRL SZ7.5 (GLOVE) ×3 IMPLANT
GOWN STRL REUS W/ TWL LRG LVL3 (GOWN DISPOSABLE) ×1 IMPLANT
GOWN STRL REUS W/TWL LRG LVL3 (GOWN DISPOSABLE) ×3
GOWN STRL REUS W/TWL XL LVL3 (GOWN DISPOSABLE) ×3 IMPLANT
NS IRRIG 1000ML POUR BTL (IV SOLUTION) ×3 IMPLANT
PACK BASIN DAY SURGERY FS (CUSTOM PROCEDURE TRAY) ×3 IMPLANT
PATTIES SURGICAL .25X.25 (GAUZE/BANDAGES/DRESSINGS) IMPLANT
SHEET MEDIUM DRAPE 40X70 STRL (DRAPES) IMPLANT
SPEAR EYE SURG WECK-CEL (MISCELLANEOUS) ×6 IMPLANT
STRIP CLOSURE SKIN 1/4X4 (GAUZE/BANDAGES/DRESSINGS) IMPLANT
SUT 6 0 SILK T G140 8DA (SUTURE) ×2 IMPLANT
SUT MERSILENE 6-0 18IN S14 8MM (SUTURE)
SUT PLAIN 6 0 TG1408 (SUTURE) ×2 IMPLANT
SUT SILK 4 0 C 3 735G (SUTURE) IMPLANT
SUT VICRYL 6 0 S 28 (SUTURE) IMPLANT
SUT VICRYL ABS 6-0 S29 18IN (SUTURE) ×4 IMPLANT
SUTURE MERSLN 6-0 18IN S14 8MM (SUTURE) IMPLANT
SYR 10ML LL (SYRINGE) ×3 IMPLANT
SYR TB 1ML LL NO SAFETY (SYRINGE) ×3 IMPLANT
TOWEL GREEN STERILE FF (TOWEL DISPOSABLE) ×3 IMPLANT
TRAY DSU PREP LF (CUSTOM PROCEDURE TRAY) ×3 IMPLANT

## 2020-07-29 NOTE — H&P (Deleted)
  The note originally documented on this encounter has been moved the the encounter in which it belongs.  

## 2020-07-29 NOTE — Transfer of Care (Signed)
Immediate Anesthesia Transfer of Care Note  Patient: Melissa Mcclure  Procedure(s) Performed: BILATERAL STRABISMUS REPAIR (Bilateral Eye)  Patient Location: PACU  Anesthesia Type:General  Level of Consciousness: sedated  Airway & Oxygen Therapy: Patient Spontanous Breathing and Patient connected to face mask oxygen  Post-op Assessment: Report given to RN and Post -op Vital signs reviewed and stable  Post vital signs: Reviewed and stable  Last Vitals:  Vitals Value Taken Time  BP    Temp    Pulse 68 07/29/20 0915  Resp 13 07/29/20 0915  SpO2 97 % 07/29/20 0915  Vitals shown include unvalidated device data.  Last Pain:  Vitals:   07/29/20 0801  TempSrc: Oral  PainSc: 0-No pain      Patients Stated Pain Goal: 8 (24/46/95 0722)  Complications: No complications documented.

## 2020-07-29 NOTE — Discharge Instructions (Signed)
Diet: Clear liquids, advance to soft foods then regular diet as tolerated by this evening.  Pain control:   1)  Ibuprofen 600 mg by mouth every 6-8 hours as needed for pain  2)  Ice pack/cold compress to operated eye(s) as desired  Eye medications:    Tobradex or Zylet eye ointment 1/2 inch in operated eye(s) twice a day   Activity: No swimming for 1 week.  It is OK to let water run over the face and eyes while showering or taking a bath, even during the first week.  No other restriction on exercise or activity.   Call Dr. Janee Morn office [phone] with any problems or concerns.    No Tylenol before 2:00pm if needed. No ibuprofen before 3:00pm if needed.   Post Anesthesia Home Care Instructions  Activity: Get plenty of rest for the remainder of the day. A responsible individual must stay with you for 24 hours following the procedure.  For the next 24 hours, DO NOT: -Drive a car -Paediatric nurse -Drink alcoholic beverages -Take any medication unless instructed by your physician -Make any legal decisions or sign important papers.  Meals: Start with liquid foods such as gelatin or soup. Progress to regular foods as tolerated. Avoid greasy, spicy, heavy foods. If nausea and/or vomiting occur, drink only clear liquids until the nausea and/or vomiting subsides. Call your physician if vomiting continues.  Special Instructions/Symptoms: Your throat may feel dry or sore from the anesthesia or the breathing tube placed in your throat during surgery. If this causes discomfort, gargle with warm salt water. The discomfort should disappear within 24 hours.  If you had a scopolamine patch placed behind your ear for the management of post- operative nausea and/or vomiting:  1. The medication in the patch is effective for 72 hours, after which it should be removed.  Wrap patch in a tissue and discard in the trash. Wash hands thoroughly with soap and water. 2. You may remove the patch  earlier than 72 hours if you experience unpleasant side effects which may include dry mouth, dizziness or visual disturbances. 3. Avoid touching the patch. Wash your hands with soap and water after contact with the patch.

## 2020-07-29 NOTE — Op Note (Signed)
07/29/2020  9:08 AM  PATIENT:  Melissa Mcclure  64 y.o. female  PRE-OPERATIVE DIAGNOSIS:  Exotropia      POST-OPERATIVE DIAGNOSIS:  Exotropia     PROCEDURE:  Lateral rectus muscle recession 6.5 mm both eyes  SURGEON:  Lorne Skeens.Annamaria Boots, M.D.   ANESTHESIA:   general  COMPLICATIONS:None  DESCRIPTION OF PROCEDURE: The patient was taken to the operating room where She was identified by me. General anesthesia was induced without difficulty after placement of appropriate monitors. The patient was prepped and draped in standard sterile fashion. A lid speculum was placed in the right eye.  Through an inferotemporal fornix incision through conjunctiva and Tenon's fascia, the right lateral rectus muscle was engaged on a series of muscle hooks and cleared of its fascial attachments. The tendon was secured with a double-armed 6-0 Vicryl suture with a double locking bite at each border of the muscle, 1 mm from the insertion. The muscle was disinserted, and was reattached to sclera at a measured distance of 6.5 millimeters posterior to the original insertion, using direct scleral passes in crossed swords fashion.  The suture ends were tied securely after the position of the muscle had been checked and found to be accurate. Conjunctiva was closed with 1 6-0 plain gut suture.  The speculum was transferred to the left eye, where an identical procedure was performed, again effecting a 6.5 millimeters recession of the lateral rectus muscle. TobraDex ointment was placed in both eyes. The patient was awakened without difficulty and taken to the recovery room in stable condition, having suffered no intraoperative or immediate postoperative complications.  Lorne Skeens. Davide Risdon M.D.    PATIENT DISPOSITION:  PACU - hemodynamically stable.

## 2020-07-29 NOTE — Anesthesia Postprocedure Evaluation (Signed)
Anesthesia Post Note  Patient: Melissa Mcclure  Procedure(s) Performed: BILATERAL STRABISMUS REPAIR (Bilateral Eye)     Patient location during evaluation: PACU Anesthesia Type: General Level of consciousness: sedated Pain management: pain level controlled Vital Signs Assessment: post-procedure vital signs reviewed and stable Respiratory status: spontaneous breathing and respiratory function stable Cardiovascular status: stable Postop Assessment: no apparent nausea or vomiting Anesthetic complications: no   No complications documented.  Last Vitals:  Vitals:   07/29/20 1000 07/29/20 1043  BP: 125/82 (!) 142/81  Pulse: 73 76  Resp: 12 14  Temp:  36.8 C  SpO2: 94% 98%    Last Pain:  Vitals:   07/29/20 1043  TempSrc:   PainSc: 2                  Talah Cookston DANIEL

## 2020-07-29 NOTE — H&P (Signed)
Date of examination:  07-18-20  Indication for surgery: to straighten the eyes and relieve diplopia  Pertinent past medical history:  Past Medical History:  Diagnosis Date  . Acid reflux    GERD  . Cancer ([place]) 08,11   melanoma-left knee, right arm  . Chronic back pain   . Complication of anesthesia   . Ductal carcinoma in situ (DCIS) of right breast 10/2017  . Family history of adverse reaction to anesthesia    mother has PONV  . Family history of colon cancer   . Family history of melanoma   . Family history of ovarian cancer   . Genetic testing 12/23/2017   STAT Breast panel with reflex to Multi-Cancer panel (83 genes) @ [facility] - No pathogenic mutations detected  . GERD (gastroesophageal reflux disease)   . H/O hiatal hernia   . Heart murmur    no problems  . History of hand surgery   . Hypertension   . PONV (postoperative nausea and vomiting)    "zofran works great  . Seasonal allergies   . SVD (spontaneous vaginal delivery)    x 1  . Vertigo     Pertinent ocular history:  Intermittent exotropia for several years, becoming harder to control  Pertinent family history:  Family History  Problem Relation Age of Onset  . Diabetes Father        borderline  . Melanoma Father        dx 23s/60s; deceased 76s  . Congestive Heart Failure Father   . Colon cancer Mother        dx 79s; deceased 48  . Hypertension Mother   . Osteoporosis Mother   . Ovarian cancer Maternal Grandmother        deceased 36s  . Heart disease Paternal Grandfather   . Melanoma Daughter        on leg; dx 11s    General:  Healthy appearing patient in no distress.    Eyes:    Acuity [place]  OD 20/25  OS 20/20  External: Within normal limits     Anterior segment:   2+ NS POU  Motility:   X(T)=20, small "V", X(T)'=30, rots nl  Fundus: deferred  Refraction: OD mild cyl  OS plano approx  Heart: Regular rate and rhythm without murmur     Lungs: Clear to auscultation      Impression:Intermittent exotropia, convergence insufficiency type  Plan: Lateral rectus muscle recession both eyes  Derry Skill

## 2020-07-29 NOTE — Anesthesia Procedure Notes (Signed)
Procedure Name: LMA Insertion Date/Time: 07/29/2020 8:14 AM Performed by: Willa Frater, CRNA Pre-anesthesia Checklist: Patient identified, Emergency Drugs available, Suction available and Patient being monitored Patient Re-evaluated:Patient Re-evaluated prior to induction Oxygen Delivery Method: Circle system utilized Preoxygenation: Pre-oxygenation with 100% oxygen Induction Type: IV induction Ventilation: Mask ventilation without difficulty LMA: LMA flexible inserted LMA Size: 4.0 Number of attempts: 1 Airway Equipment and Method: Bite block Placement Confirmation: positive ETCO2 Tube secured with: Tape Dental Injury: Teeth and Oropharynx as per pre-operative assessment

## 2020-08-01 ENCOUNTER — Encounter (HOSPITAL_BASED_OUTPATIENT_CLINIC_OR_DEPARTMENT_OTHER): Payer: Self-pay | Admitting: Ophthalmology

## 2020-08-03 NOTE — Telephone Encounter (Signed)
Rx sent on 07/26/20, authorized by Dr.Miller.   Encounter closed.

## 2020-08-03 NOTE — Telephone Encounter (Signed)
Is this encounter ok to close?

## 2020-08-24 ENCOUNTER — Telehealth: Payer: Self-pay

## 2020-08-24 NOTE — Telephone Encounter (Signed)
AEX 06/24/20 with SM H/o DCIS 10/2017, lumpectomy 12/2017 with radioactive seed Stopped HRT 10/2017 Lap Hysterectomy 06/2016  Spoke with pt. Pt states calling to give updated on Rx.  Pt states taking gabapentin Rx 300 mg nightly and doing well and having better controlled vasomotor sx. Pt states is still taking Effexor XR Rx daily as well and wanting to know if needs and how to wean off it? Pt was placed on 100mg  gabapentin Rx on 06/24/20 and then increased to 300 mg nightly on 07/22/20. Pt was giving gabapentin a trial run to see if sx improved and since they have improved, pt would like go off Effexor. Pt is still taking raloxifene as well.  Pt does not need refill at this time. Has 3 more months left.   Ok for new Rx if needs to continue?   Advised will review with Dr Quincy Simmonds and return call with recommendations/advice. Pt agreeable.  Pt states please call on cell number for callback.   Routing to Dr Quincy Simmonds, please advise

## 2020-08-24 NOTE — Telephone Encounter (Signed)
Patient wants to discuss her medication venlafaxine XR.

## 2020-08-25 NOTE — Telephone Encounter (Signed)
[place] for patient to call back if she needs refills on her Neurontin.   If she wants to stop the Effexor, please have her contact Dr. Burr Medico, who is prescribing this.  She may need to wean off slowly.

## 2020-08-25 NOTE — Telephone Encounter (Signed)
Spoke with pt. Pt given update and recommendations per Dr Quincy Simmonds. Pt verbalized understanding and will contact Dr Burr Medico for weaning off Effexor.  Encounter closed

## 2020-08-26 ENCOUNTER — Telehealth: Payer: Self-pay

## 2020-08-26 DIAGNOSIS — D0511 Intraductal carcinoma in situ of right breast: Secondary | ICD-10-CM

## 2020-08-26 MED ORDER — VENLAFAXINE HCL ER 75 MG PO CP24: 75.0000 mg | ORAL_CAPSULE | Freq: Every day | ORAL | 0 refills | Status: DC

## 2020-08-26 NOTE — Telephone Encounter (Signed)
Per Dr Burr Medico patient to take Effexor 75mg  daily x 7 days and then 1 tablet every other day and then stop.  I left detailed vm with these instructions for patient.  New Rx sent to her pharmacy

## 2020-08-26 NOTE — Telephone Encounter (Signed)
Melissa Mcclure called stating she stopped the raloxifene for 2 months and still had hot flashes so she restarted.  Dr Sabra Heck started her on gabapentin 300mg  qhs and that is helping the hot flashes.  She wants to know if she needs to wean off the effexor?

## 2020-08-26 NOTE — Telephone Encounter (Signed)
Yes, it's OK to wean off effexor if she feels that's not help her.   Truitt Merle MD

## 2020-08-29 DIAGNOSIS — R198 Other specified symptoms and signs involving the digestive system and abdomen: Secondary | ICD-10-CM | POA: Diagnosis not present

## 2020-08-29 DIAGNOSIS — K2100 Gastro-esophageal reflux disease with esophagitis, without bleeding: Secondary | ICD-10-CM | POA: Diagnosis not present

## 2020-08-31 DIAGNOSIS — M5459 Other low back pain: Secondary | ICD-10-CM | POA: Diagnosis not present

## 2020-08-31 DIAGNOSIS — M419 Scoliosis, unspecified: Secondary | ICD-10-CM | POA: Diagnosis not present

## 2020-08-31 DIAGNOSIS — M5415 Radiculopathy, thoracolumbar region: Secondary | ICD-10-CM | POA: Diagnosis not present

## 2020-08-31 DIAGNOSIS — M4316 Spondylolisthesis, lumbar region: Secondary | ICD-10-CM | POA: Diagnosis not present

## 2020-09-01 DIAGNOSIS — H40013 Open angle with borderline findings, low risk, bilateral: Secondary | ICD-10-CM | POA: Diagnosis not present

## 2020-09-01 DIAGNOSIS — H5010 Unspecified exotropia: Secondary | ICD-10-CM | POA: Diagnosis not present

## 2020-09-01 DIAGNOSIS — H04123 Dry eye syndrome of bilateral lacrimal glands: Secondary | ICD-10-CM | POA: Diagnosis not present

## 2020-09-01 DIAGNOSIS — H40033 Anatomical narrow angle, bilateral: Secondary | ICD-10-CM | POA: Diagnosis not present

## 2020-09-02 DIAGNOSIS — L821 Other seborrheic keratosis: Secondary | ICD-10-CM | POA: Diagnosis not present

## 2020-09-02 DIAGNOSIS — Z1283 Encounter for screening for malignant neoplasm of skin: Secondary | ICD-10-CM | POA: Diagnosis not present

## 2020-09-02 DIAGNOSIS — Z85820 Personal history of malignant melanoma of skin: Secondary | ICD-10-CM | POA: Diagnosis not present

## 2020-09-02 DIAGNOSIS — Z08 Encounter for follow-up examination after completed treatment for malignant neoplasm: Secondary | ICD-10-CM | POA: Diagnosis not present

## 2020-09-02 DIAGNOSIS — X32XXXD Exposure to sunlight, subsequent encounter: Secondary | ICD-10-CM | POA: Diagnosis not present

## 2020-09-02 DIAGNOSIS — L570 Actinic keratosis: Secondary | ICD-10-CM | POA: Diagnosis not present

## 2020-09-06 ENCOUNTER — Other Ambulatory Visit: Payer: Self-pay | Admitting: Neurosurgery

## 2020-09-06 ENCOUNTER — Other Ambulatory Visit (HOSPITAL_COMMUNITY): Payer: Self-pay | Admitting: Neurosurgery

## 2020-09-06 DIAGNOSIS — M4316 Spondylolisthesis, lumbar region: Secondary | ICD-10-CM

## 2020-09-06 DIAGNOSIS — M5415 Radiculopathy, thoracolumbar region: Secondary | ICD-10-CM

## 2020-09-08 ENCOUNTER — Other Ambulatory Visit: Payer: Self-pay | Admitting: Hematology

## 2020-09-08 DIAGNOSIS — D0511 Intraductal carcinoma in situ of right breast: Secondary | ICD-10-CM

## 2020-09-19 ENCOUNTER — Ambulatory Visit (HOSPITAL_COMMUNITY)
Admission: RE | Admit: 2020-09-19 | Discharge: 2020-09-19 | Disposition: A | Discharge status: Home / Self Care | Payer: BC Managed Care – PPO | Source: Ambulatory Visit | Attending: Neurosurgery | Admitting: Neurosurgery

## 2020-09-19 ENCOUNTER — Other Ambulatory Visit: Payer: Self-pay

## 2020-09-19 ENCOUNTER — Ambulatory Visit (HOSPITAL_COMMUNITY): Payer: BC Managed Care – PPO

## 2020-09-19 DIAGNOSIS — M5126 Other intervertebral disc displacement, lumbar region: Secondary | ICD-10-CM | POA: Diagnosis not present

## 2020-09-19 DIAGNOSIS — M5415 Radiculopathy, thoracolumbar region: Secondary | ICD-10-CM | POA: Insufficient documentation

## 2020-09-19 DIAGNOSIS — M48061 Spinal stenosis, lumbar region without neurogenic claudication: Secondary | ICD-10-CM | POA: Diagnosis not present

## 2020-09-19 DIAGNOSIS — M4184 Other forms of scoliosis, thoracic region: Secondary | ICD-10-CM | POA: Diagnosis not present

## 2020-09-19 DIAGNOSIS — M4316 Spondylolisthesis, lumbar region: Secondary | ICD-10-CM | POA: Insufficient documentation

## 2020-09-19 DIAGNOSIS — M5124 Other intervertebral disc displacement, thoracic region: Secondary | ICD-10-CM | POA: Diagnosis not present

## 2020-09-19 MED ORDER — GADOBUTROL 1 MMOL/ML IV SOLN
6.0000 mL | Freq: Once | INTRAVENOUS | Status: AC | PRN
  Administered 2020-09-19: 16:00:00 6 mL via INTRAVENOUS

## 2020-09-20 DIAGNOSIS — M13131 Monoarthritis, not elsewhere classified, right wrist: Secondary | ICD-10-CM | POA: Diagnosis not present

## 2020-09-20 DIAGNOSIS — G8918 Other acute postprocedural pain: Secondary | ICD-10-CM | POA: Diagnosis not present

## 2020-09-20 DIAGNOSIS — M65831 Other synovitis and tenosynovitis, right forearm: Secondary | ICD-10-CM | POA: Diagnosis not present

## 2020-09-20 DIAGNOSIS — M1811 Unilateral primary osteoarthritis of first carpometacarpal joint, right hand: Secondary | ICD-10-CM | POA: Diagnosis not present

## 2020-09-20 DIAGNOSIS — M654 Radial styloid tenosynovitis [de Quervain]: Secondary | ICD-10-CM | POA: Diagnosis not present

## 2020-10-06 DIAGNOSIS — M1811 Unilateral primary osteoarthritis of first carpometacarpal joint, right hand: Secondary | ICD-10-CM | POA: Diagnosis not present

## 2020-10-06 DIAGNOSIS — M180 Bilateral primary osteoarthritis of first carpometacarpal joints: Secondary | ICD-10-CM | POA: Diagnosis not present

## 2020-10-06 DIAGNOSIS — Z4789 Encounter for other orthopedic aftercare: Secondary | ICD-10-CM | POA: Diagnosis not present

## 2020-10-19 DIAGNOSIS — M1811 Unilateral primary osteoarthritis of first carpometacarpal joint, right hand: Secondary | ICD-10-CM | POA: Diagnosis not present

## 2020-10-24 DIAGNOSIS — I10 Essential (primary) hypertension: Secondary | ICD-10-CM | POA: Diagnosis not present

## 2020-10-24 DIAGNOSIS — M4316 Spondylolisthesis, lumbar region: Secondary | ICD-10-CM | POA: Diagnosis not present

## 2020-10-24 DIAGNOSIS — Z6826 Body mass index (BMI) 26.0-26.9, adult: Secondary | ICD-10-CM | POA: Diagnosis not present

## 2020-10-24 DIAGNOSIS — M5415 Radiculopathy, thoracolumbar region: Secondary | ICD-10-CM | POA: Diagnosis not present

## 2020-11-07 DIAGNOSIS — M79644 Pain in right finger(s): Secondary | ICD-10-CM | POA: Diagnosis not present

## 2020-11-07 DIAGNOSIS — M1811 Unilateral primary osteoarthritis of first carpometacarpal joint, right hand: Secondary | ICD-10-CM | POA: Diagnosis not present

## 2020-11-09 DIAGNOSIS — R609 Edema, unspecified: Secondary | ICD-10-CM | POA: Diagnosis not present

## 2020-11-09 DIAGNOSIS — R5383 Other fatigue: Secondary | ICD-10-CM | POA: Diagnosis not present

## 2020-11-09 DIAGNOSIS — R0600 Dyspnea, unspecified: Secondary | ICD-10-CM | POA: Diagnosis not present

## 2020-11-10 DIAGNOSIS — M5416 Radiculopathy, lumbar region: Secondary | ICD-10-CM | POA: Diagnosis not present

## 2020-11-14 ENCOUNTER — Encounter (HOSPITAL_BASED_OUTPATIENT_CLINIC_OR_DEPARTMENT_OTHER): Payer: Self-pay

## 2020-11-14 NOTE — Progress Notes (Signed)
[facility]   [phone] [phone]   Clinic Follow up Note   Patient Care Team: Maury Dus, MD as PCP - General (Family Medicine)   I connected with Melissa Mcclure on 11/16/2020 at 10:00 AM EST by telephone visit and verified that I am speaking with the correct person using two identifiers.  I discussed the limitations, risks, security and privacy concerns of performing an evaluation and management service by telephone and the availability of in person appointments. I also discussed with the patient that there may be a patient responsible charge related to this service. The patient expressed understanding and agreed to proceed.   Other persons participating in the visit and their role in the encounter:  None  Patient's location:  Her home  Provider's location:  My office   CHIEF COMPLAINT: F/u of right breast DCIS  SUMMARY OF ONCOLOGIC HISTORY: Oncology History Overview Note  Cancer Staging Ductal carcinoma in situ (DCIS) of right breast Staging form: Breast, AJCC 8th Edition - Clinical stage from 11/19/2017: Stage 0 (cTis (DCIS), cN0, cM0, ER: Positive, PR: Positive, HER2: Not assessed ) - Signed by Truitt Merle, MD on 11/26/2017     Ductal carcinoma in situ (DCIS) of right breast  11/07/2017 Mammogram    Diagnostic Mammogram Right breast 11/07/17 IMPRESSION:  The results of the exam were reviewed with the patient.  The 3 cm are of grouped branching linear calcifications in the right breast lower inner quadrant middle depth are at moderate concern but not classic for malignancy.  A Stereotactic biopsy is recommended.   11/19/2017 Initial Biopsy   Diagnosis 11/19/17 Breast, right, needle core biopsy - DUCTAL CARCINOMA IN SITU WITH CALCIFICATIONS. - SEE COMMENT.    11/19/2017 Receptors her2   Estrogen Receptor: 90%, POSITIVE, STRONG STAINING INTENSITY Progesterone Receptor: 25%, POSITIVE, STRONG STAINING INTENSITY    11/19/2017 Initial  Diagnosis   Ductal carcinoma in situ (DCIS) of right breast   12/23/2017 Genetic Testing   Testing did not reveal a pathogenic mutation in any of the genes analyzed.A copy of the genetic test report will be scanned into Epic under the Media tab.  The genes analyzed were the 83 genes on [facility]'s Multi-Cancer panel (ALK, APC, ATM, AXIN2, BAP1, BARD1, BLM, BMPR1A, BRCA1, BRCA2, BRIP1, CASR, CDC73, CDH1, CDK4, CDKN1B, CDKN1C, CDKN2A, CEBPA, CHEK2, CTNNA1, DICER1, DIS3L2, EGFR, EPCAM, FH, FLCN, GATA2, GPC3, GREM1, HOXB13, HRAS, KIT, MAX, MEN1, MET, MITF, MLH1, MSH2, MSH3, MSH6, MUTYH, NBN, NF1, NF2, NTHL1, PALB2, PDGFRA, PHOX2B, PMS2, POLD1, POLE, POT1, PRKAR1A, PTCH1, PTEN, RAD50, RAD51C, RAD51D, RB1, RECQL4, RET, RUNX1, SDHA, SDHAF2, SDHB, SDHC, SDHD, SMAD4, SMARCA4, SMARCB1, SMARCE1, STK11, SUFU, TERC, TERT, TMEM127, TP53, TSC1, TSC2, VHL, WRN, WT1).   12/25/2017 Pathology Results   Diagnosis, 12/25/2017 Breast, lumpectomy, Right w/seed - DUCTAL CARCINOMA IN SITU WITH CALCIFICATIONS, LOW GRADE, SPANNING 1.3 CM. - THE SURGICAL RESECTION MARGINS ARE NEGATIVE FOR CARCINOMA   01/20/2018 - 02/14/2018 Radiation Therapy   Radiation treatment dates:   01/20/2018 - 02/14/2018  Site/dose:   The patient initially received a dose of 42.5 Gy in 17 fractions to the right breast using whole-breast tangent fields. This was delivered using a 3-D conformal technique. The patient then received a boost to the seroma. This delivered an additional 7.5 Gy in 3 fractions using a 3 field photon technique due to the depth of the seroma. The total dose was 50 Gy.  Narrative: The patient tolerated radiation treatment relatively well.   The patient had some expected  skin irritation as she progressed during treatment. Moist desquamation was not present at the end of treatment.    02/2018 -  Anti-estrogen oral therapy   Raloxifene 48m once daily starting in 02/2018      CURRENT THERAPY:  Raloxifene 625monce daily starting  in 02/2018. Hold For 2 months in 2021 due to hot flashes.   INTERVAL HISTORY:  Melissa DUNSWORTHs here for a follow up of right breast cancer. She was last seen by me 6 months ago. They identified themselves by birth date. She notes her hot flashes are still significant. She stopped Gabapentin due to lack of help. She plans to go to acupuncturist in 2 days. She notes recent SOB and cough and want to consult with cardiologist. She notes she restarted her Raloxifene after holding for 2 months.    REVIEW OF SYSTEMS:   Constitutional: Denies fevers, chills or abnormal weight loss (+) Hot flashes  Eyes: Denies blurriness of vision Ears, nose, mouth, throat, and face: Denies mucositis or sore throat Respiratory: (+) SOB and cough  Cardiovascular: Denies palpitation, chest discomfort or lower extremity swelling Gastrointestinal:  Denies nausea, heartburn or change in bowel habits Skin: Denies abnormal skin rashes Lymphatics: Denies new lymphadenopathy or easy bruising Neurological:Denies numbness, tingling or new weaknesses Behavioral/Psych: Mood is stable, no new changes  All other systems were reviewed with the patient and are negative.  MEDICAL HISTORY:  Past Medical History:  Diagnosis Date  . Acid reflux    GERD  . Cancer ([id number]   melanoma-left knee, right arm  . Chronic back pain   . Complication of anesthesia   . Ductal carcinoma in situ (DCIS) of right breast 10/2017  . Family history of adverse reaction to anesthesia    mother has PONV  . Family history of colon cancer   . Family history of melanoma   . Family history of ovarian cancer   . Genetic testing 12/23/2017   STAT Breast panel with reflex to Multi-Cancer panel (83 genes) @ [facility] - No pathogenic mutations detected  . GERD (gastroesophageal reflux disease)   . H/O hiatal hernia   . Heart murmur    no problems  . History of hand surgery   . Hypertension   . PONV (postoperative nausea and vomiting)    "zofran  works great  . Seasonal allergies   . SVD (spontaneous vaginal delivery)    x 1  . Vertigo     SURGICAL HISTORY: Past Surgical History:  Procedure Laterality Date  . ANTERIOR LAT LUMBAR FUSION Right 04/17/2013   Procedure: ANTERIOR LATERAL LUMBAR FUSION 3 LEVELS;  Surgeon: JoErline LevineMD;  [facility];  Service: Neurosurgery;  Laterality: Right;  Right Lumbar Two-Three Lumbar Three-Four Lumbar Four-Five  Anterolateral Fusion  . BREAST LUMPECTOMY WITH RADIOACTIVE SEED LOCALIZATION Right 12/25/2017   Procedure: RIGHT BREAST LUMPECTOMY WITH RADIOACTIVE SEED LOCALIZATION;  Surgeon: InFanny SkatesMD;  [place] [facility] Service: General;  Laterality: Right;  . [facility]. CESAREAN SECTION  1980  . COLONOSCOPY    . COLONOSCOPY WITH PROPOFOL N/A 06/23/2018   Procedure: COLONOSCOPY WITH PROPOFOL;  Surgeon: MaClarene EssexMD;  [facility];  Service: Endoscopy;  Laterality: N/A;  . CYSTOSCOPY N/A 06/26/2016   Procedure: CYSTOSCOPY;  Surgeon: MaMegan SalonMD;  [facility];  Service: Gynecology;  Laterality: N/A;  . DILATATION & CURETTAGE/HYSTEROSCOPY WITH MYOSURE N/A 01/24/2016   Procedure: DILATATION & CURETTAGE/HYSTEROSCOPY WITH  MYOSURE;  Surgeon: Megan Salon, MD;  Location: [facility];  Service: Gynecology;  Laterality: N/A;  . FOOT SURGERY Right    early 20s-bone spur removal  . LAPAROSCOPIC HYSTERECTOMY Bilateral 06/26/2016   Procedure: HYSTERECTOMY TOTAL LAPAROSCOPIC;  Surgeon: Megan Salon, MD;  Location: [facility];  Service: Gynecology;  Laterality: Bilateral;  . LUMBAR PERCUTANEOUS PEDICLE SCREW 3 LEVEL N/A 04/17/2013   Procedure: LUMBAR PERCUTANEOUS PEDICLE SCREW 3 LEVEL;  Surgeon: Erline Levine, MD;  Location: [facility];  Service: Neurosurgery;  Laterality: N/A;  Decompression and Posterior Lumbar Interbody Fusion of Lumbar Five-Sacral One, Percutaneous Screws Lumbar Two through Sacral One  . MELANOMA EXCISION     knee 08 lft. elbow  11rt  . SALPINGOOPHORECTOMY Bilateral 06/26/2016   Procedure: SALPINGO OOPHORECTOMY;  Surgeon: Megan Salon, MD;  Location: [facility];  Service: Gynecology;  Laterality: Bilateral;  . SHOULDER SURGERY Right 04/28/2015  . STRABISMUS SURGERY Bilateral 07/29/2020   Procedure: BILATERAL STRABISMUS REPAIR;  Surgeon: Everitt Amber, MD;  Location: [facility];  Service: Ophthalmology;  Laterality: Bilateral;  . TUBAL LIGATION  1986  . WLE  06/17/07   and left groin lymph node    I have reviewed the social history and family history with the patient and they are unchanged from previous note.  ALLERGIES:  is allergic to elemental sulfur.  MEDICATIONS:  Current Outpatient Medications  Medication Sig Dispense Refill  . acetaminophen (TYLENOL) 500 MG tablet Take 1,000 mg by mouth 2 (two) times daily as needed for mild pain. Reported on 01/23/2016    . Ascorbic Acid (VITAMIN C WITH ROSE HIPS) 1000 MG tablet Take 1,000 mg by mouth daily. Reported on 01/23/2016    . Calcium-Vitamin D-Vitamin K (VIACTIV CALCIUM PLUS D) 650-12.5-40 MG-MCG-MCG CHEW Chew 1 tablet by mouth daily.    . cetirizine (ZYRTEC) 10 MG tablet Take 10 mg by mouth daily. Reported on 01/23/2016    . diclofenac (VOLTAREN) 75 MG EC tablet Take 75 mg by mouth daily.    Marland Kitchen gabapentin (NEURONTIN) 300 MG capsule Take 1 capsule (300 mg total) by mouth at bedtime. 30 capsule 3  . ibuprofen (ADVIL,MOTRIN) 200 MG tablet Take 400 mg by mouth daily as needed (for pain.).    Marland Kitchen Lactobacillus Rhamnosus, GG, (CULTURELLE PO) Take 1 capsule by mouth at bedtime.     Marland Kitchen losartan-hydrochlorothiazide (HYZAAR) 100-12.5 MG per tablet Take 1 tablet by mouth daily. Reported on 01/23/2016    . meclizine (ANTIVERT) 25 MG tablet Take 25 mg by mouth 3 (three) times daily as needed (vertigo). Reported on 01/23/2016    . Multiple Vitamins-Minerals (ALIVE WOMENS 50+) TABS Take 1 tablet by mouth daily.    . ondansetron (ZOFRAN ODT) 4 MG disintegrating tablet 54m ODT q4  hours prn nausea/vomit (Patient not taking: Reported on 06/24/2020) 10 tablet 0  . pantoprazole (PROTONIX) 40 MG tablet Take 40 mg by mouth daily. Reported on 01/23/2016    . polyethylene glycol (MIRALAX / GLYCOLAX) packet Take 17 g by mouth at bedtime. Reported on 01/23/2016    . raloxifene (EVISTA) 60 MG tablet Take 1 tablet (60 mg total) by mouth daily. 90 tablet 3  . traMADol (ULTRAM) 50 MG tablet Take 50 mg by mouth 2 (two) times daily as needed (for pain).     .Marland Kitchenvenlafaxine XR (EFFEXOR-XR) 75 MG 24 hr capsule Take 1 capsule (75 mg total) by mouth daily with breakfast. Take 1 capsule for 7 days and then 1 capsule every other  day for 14 days and then stop 14 capsule 0   No current facility-administered medications for this visit.    PHYSICAL EXAMINATION: ECOG PERFORMANCE STATUS: 1 - Symptomatic but completely ambulatory  No vitals taken today, Exam not performed today   LABORATORY DATA:  I have reviewed the data as listed CBC Latest Ref Rng & Units 05/16/2020 05/15/2019 11/14/2018  WBC 4.0 - 10.5 K/uL 5.2 4.2 4.2  Hemoglobin 12.0 - 15.0 g/dL 13.5 12.7 13.2  Hematocrit 36.0 - 46.0 % 39.8 37.7 39.1  Platelets 150 - 400 K/uL 268 244 245     CMP Latest Ref Rng & Units 07/26/2020 05/16/2020 05/15/2019  Glucose 70 - 99 mg/dL 85 88 72  BUN 8 - 23 mg/dL 18 22 24(H)  Creatinine 0.44 - 1.00 mg/dL 0.68 0.85 0.79  Sodium 135 - 145 mmol/L 140 138 139  Potassium 3.5 - 5.1 mmol/L 4.2 3.7 4.0  Chloride 98 - 111 mmol/L 103 103 102  CO2 22 - 32 mmol/L _0 Calcium 8.9 - 10.3 mg/dL 9.4 9.9 9.2  Total Protein 6.5 - 8.1 g/dL - 7.0 6.5  Total Bilirubin 0.3 - 1.2 mg/dL - 0.5 0.4  Alkaline Phos 38 - 126 U/L - 66 69  AST 15 - 41 U/L - 23 25  ALT 0 - 44 U/L - 22 26      RADIOGRAPHIC STUDIES: I have personally reviewed the radiological images as listed and agreed with the findings in the report. No results found.   ASSESSMENT & PLAN:  Melissa Mcclure is a 65 y.o. female with    1. Ductal  carcinoma in situ (DCIS) of right breast, ER/PR Positive, Grade 1 -She was diagnosed in 10/2017. She is s/p right breast lumpectomy and adjuvant radiation.  -She started anti-estrogen therapy with Raloxifene in 02/2018. She held it for 2 months in 2021 due to hot flashes  -From a abreast cancer standpoint, She is clinically doing well. Her 11/2019 mammogram unremarkable. There is no clinical concern for recurrence. -Next Mammogram in 11/2020.  -Continue Raloxifene  -F/u in 6 months.    2. Dyspnea on exertion, Hiatal Hernia  -She notes several months of SOB on exertion, with intermittent dry cough. She has received both her COVID19 vaccine.  -She notes her chest X[facility] at [facility] walk in clinic shows large hiatal hernia and enlarged heart. She would like an echo, I will order. If echo abnormal, will consult with cardiologist.  -She is interested in hiatal hernia repair surgery. Her breast surgeon Dr Dalbert Batman has retired. I will look for another surgery who can do this. GI Dr Watt Climes has had her on Protonix for years. If she has acid reflux from hiatal hernia, she can continue protonix.    3. Hot Flashes  -She knows to avoid hormonal replacement. Gabapentindid not work well enough for her.This is worse in the day and does not significantly effect her sleep.  -holding Raloxifene did not help.  -Improved and controlled on 187m effexor. Will continue  4. Genetic Counselingwas negative forpathogenic mutation.   5. HTN, history of skin melanoma, Arthritis  -Follow-up with PCP -She has chronic diffuse skin rash of her LE. She will continue f/u with her Dermatologist.  -OA Mainly in hands, back and neck  6. Bone health  -[facility] lowest T-Score of -0.4 at left femur neck. Her 12/10/19 was stable (T-score -0.5).  -She is on Raloxifene which strengthens her bone.   PLAN: -  Per pt request I will order Echo next week.  -Mammogram at [facility] in 11/2020  -Lab and f/u  in 6 months.  -I will reach out to [facility] for hiatal hernia referral    No problem-specific Assessment & Plan notes found for this encounter.   Orders Placed This Encounter  Procedures  . ECHOCARDIOGRAM COMPLETE    Standing Status:   Future    Standing Expiration Date:   11/16/2021    Order Specific Question:   Where should this test be performed    Answer:   [facility])    Order Specific Question:   Does the patient weigh less than or greater than 250 lbs?    Answer:   Patient weighs less than 250 lbs    Order Specific Question:   Perflutren DEFINITY (image enhancing agent) should be administered unless hypersensitivity or allergy exist    Answer:   Administer Perflutren    Order Specific Question:   Is a special reader required? (athlete or structural heart)    Answer:   No    Order Specific Question:   Does this study need to be read by the Structural team/Level 3 readers?    Answer:   No    Order Specific Question:   Reason for exam-Echo    Answer:   Dyspnea  R06.00    Order Specific Question:   Release to patient    Answer:   Immediate   I discussed the assessment and treatment plan with the patient. The patient was provided an opportunity to ask questions and all were answered. The patient agreed with the plan and demonstrated an understanding of the instructions.  The patient was advised to call back or seek an in-person evaluation if the symptoms worsen or if the condition fails to improve as anticipated.  The total time spent in the appointment was 25 minutes.    Truitt Merle, MD 11/16/2020   I, Joslyn Devon, am acting as scribe for Truitt Merle, MD.   I have reviewed the above documentation for accuracy and completeness, and I agree with the above.

## 2020-11-16 ENCOUNTER — Encounter: Payer: Self-pay | Admitting: Hematology

## 2020-11-16 ENCOUNTER — Inpatient Hospital Stay: Payer: BC Managed Care – PPO | Attending: Hematology | Admitting: Hematology

## 2020-11-16 ENCOUNTER — Other Ambulatory Visit (HOSPITAL_BASED_OUTPATIENT_CLINIC_OR_DEPARTMENT_OTHER): Payer: Self-pay | Admitting: Obstetrics & Gynecology

## 2020-11-16 DIAGNOSIS — D0511 Intraductal carcinoma in situ of right breast: Secondary | ICD-10-CM | POA: Diagnosis not present

## 2020-11-17 ENCOUNTER — Telehealth: Payer: Self-pay | Admitting: Hematology

## 2020-11-17 NOTE — Telephone Encounter (Signed)
Scheduled follow-up appointment per 2/23 los. Patient is aware. 

## 2020-11-22 DIAGNOSIS — M79644 Pain in right finger(s): Secondary | ICD-10-CM | POA: Diagnosis not present

## 2020-11-29 DIAGNOSIS — H5034 Intermittent alternating exotropia: Secondary | ICD-10-CM | POA: Diagnosis not present

## 2020-12-05 DIAGNOSIS — Z4789 Encounter for other orthopedic aftercare: Secondary | ICD-10-CM | POA: Diagnosis not present

## 2020-12-05 DIAGNOSIS — M79644 Pain in right finger(s): Secondary | ICD-10-CM | POA: Diagnosis not present

## 2020-12-07 DIAGNOSIS — M4316 Spondylolisthesis, lumbar region: Secondary | ICD-10-CM | POA: Diagnosis not present

## 2020-12-07 DIAGNOSIS — M545 Low back pain, unspecified: Secondary | ICD-10-CM | POA: Diagnosis not present

## 2020-12-07 DIAGNOSIS — M5415 Radiculopathy, thoracolumbar region: Secondary | ICD-10-CM | POA: Diagnosis not present

## 2020-12-07 DIAGNOSIS — M419 Scoliosis, unspecified: Secondary | ICD-10-CM | POA: Diagnosis not present

## 2020-12-13 DIAGNOSIS — Z853 Personal history of malignant neoplasm of breast: Secondary | ICD-10-CM | POA: Diagnosis not present

## 2020-12-15 ENCOUNTER — Ambulatory Visit (HOSPITAL_COMMUNITY): Payer: BC Managed Care – PPO | Attending: Cardiovascular Disease

## 2020-12-15 ENCOUNTER — Other Ambulatory Visit: Payer: Self-pay

## 2020-12-15 DIAGNOSIS — R0600 Dyspnea, unspecified: Secondary | ICD-10-CM

## 2020-12-15 DIAGNOSIS — D0511 Intraductal carcinoma in situ of right breast: Secondary | ICD-10-CM | POA: Insufficient documentation

## 2020-12-15 LAB — ECHOCARDIOGRAM COMPLETE
Area-P 1/2: 3.42 cm2
S' Lateral: 2.6 cm

## 2020-12-16 DIAGNOSIS — K449 Diaphragmatic hernia without obstruction or gangrene: Secondary | ICD-10-CM | POA: Diagnosis not present

## 2020-12-19 ENCOUNTER — Telehealth: Payer: Self-pay

## 2020-12-19 NOTE — Telephone Encounter (Signed)
Called spoke with pt made her aware of echo results and to follow up with PCP

## 2020-12-19 NOTE — Telephone Encounter (Signed)
-----   Message from Truitt Merle, MD sent at 12/19/2020  7:32 AM EDT ----- Please let pt know her echo was normal, please let her f/u with her PCP for dyspnea, thanks   Truitt Merle  12/19/2020

## 2020-12-20 ENCOUNTER — Other Ambulatory Visit: Payer: Self-pay | Admitting: General Surgery

## 2020-12-20 DIAGNOSIS — K449 Diaphragmatic hernia without obstruction or gangrene: Secondary | ICD-10-CM

## 2020-12-23 ENCOUNTER — Other Ambulatory Visit: Payer: Self-pay | Admitting: General Surgery

## 2020-12-26 ENCOUNTER — Other Ambulatory Visit: Payer: Self-pay | Admitting: General Surgery

## 2020-12-26 DIAGNOSIS — K449 Diaphragmatic hernia without obstruction or gangrene: Secondary | ICD-10-CM

## 2020-12-28 ENCOUNTER — Encounter: Payer: Self-pay | Admitting: Hematology

## 2021-01-05 ENCOUNTER — Ambulatory Visit
Admission: RE | Admit: 2021-01-05 | Discharge: 2021-01-05 | Disposition: A | Discharge status: Home / Self Care | Payer: BC Managed Care – PPO | Source: Ambulatory Visit | Attending: General Surgery | Admitting: General Surgery

## 2021-01-05 ENCOUNTER — Other Ambulatory Visit: Payer: Self-pay

## 2021-01-05 DIAGNOSIS — K449 Diaphragmatic hernia without obstruction or gangrene: Secondary | ICD-10-CM

## 2021-01-05 DIAGNOSIS — J9811 Atelectasis: Secondary | ICD-10-CM | POA: Diagnosis not present

## 2021-01-05 DIAGNOSIS — K5730 Diverticulosis of large intestine without perforation or abscess without bleeding: Secondary | ICD-10-CM | POA: Diagnosis not present

## 2021-01-05 DIAGNOSIS — N200 Calculus of kidney: Secondary | ICD-10-CM | POA: Diagnosis not present

## 2021-01-09 ENCOUNTER — Ambulatory Visit
Admission: RE | Admit: 2021-01-09 | Discharge: 2021-01-09 | Disposition: A | Discharge status: Home / Self Care | Payer: BC Managed Care – PPO | Source: Ambulatory Visit | Attending: General Surgery | Admitting: General Surgery

## 2021-01-09 DIAGNOSIS — K449 Diaphragmatic hernia without obstruction or gangrene: Secondary | ICD-10-CM | POA: Diagnosis not present

## 2021-01-09 DIAGNOSIS — K219 Gastro-esophageal reflux disease without esophagitis: Secondary | ICD-10-CM | POA: Diagnosis not present

## 2021-01-16 ENCOUNTER — Ambulatory Visit: Payer: Self-pay | Admitting: General Surgery

## 2021-01-16 DIAGNOSIS — K449 Diaphragmatic hernia without obstruction or gangrene: Secondary | ICD-10-CM | POA: Diagnosis not present

## 2021-01-16 NOTE — H&P (Signed)
History of Present Illness The patient is a Melissa Mcclure who presents with a hiatal hernia. Patient is a Melissa-year-old Mcclure, fold-back up today after having a CT scan and upper GI. CT scan did show a small 3 cm hiatal hernia.  Patient's upper GI.  The appropriate with her hiatal hernia and reflux.   Patient states that she continues with shortness of breath, dysphagia, and reflux. ---------------- Patient is a Melissa-year-old Mcclure, who is referred by Dr. Reed for an evaluation of a hiatal hernia. Patient has a long-standing history of hiatal hernia, reflux, on chronic omeprazole. Patient states that she is recently become more short of breath.  She feels that she's had an increase in the size of her hiatal hernia which is likely causing her shortness of breath. Patient does have to raise her head of bed had nighttime to prevent reflux.  Patient does have water brash, choking spells at nighttime.   She has a previous C-section 2 in the past and a right inguinal hernia repair.   She does not have a recent CT scan her upper GI.     Allergies Sulfur   Allergies Reconciled     Medication History Calcium  (500MG Tablet, Oral) Active. Fiber Complete  (Oral) Active. Advil  (200MG Tablet, Oral) Active. Amitriptyline HCl  (50MG Tablet, Oral) Active. Estradiol  (0.05MG/24HR Patch TW, Transdermal) Active. Fiber Adult Gummies  (2GM Tablet Chewable, Oral) Active. Losartan Potassium-HCTZ  (100-12.5MG Tablet, Oral) Active. Meloxicam  (15MG Tablet, Oral) Active. Multiple Vitamin  (1 (one) Oral) Active. TiZANidine HCl  (2MG Capsule, Oral) Active. Vitamin C  (1000MG Tablet, Oral) Active. ZyrTEC Allergy  (10MG Tablet, Oral) Active. Pepcid AC  (10MG Tablet, Oral) Active. Protonix  (40MG Tablet DR, Oral) Active. Medications Reconciled        Review of Systems General Not Present- Appetite Loss, Chills, Fatigue, Fever, Night Sweats, Weight Gain and Weight Loss. Skin Not Present- Change in  Wart/Mole, Dryness, Hives, Jaundice, New Lesions, Non-Healing Wounds, Rash and Ulcer. HEENT Present- Wears glasses/contact lenses. Not Present- Earache, Hearing Loss, Hoarseness, Nose Bleed, Oral Ulcers, Ringing in the Ears, Seasonal Allergies, Sinus Pain, Sore Throat, Visual Disturbances and Yellow Eyes. Breast Not Present- Breast Mass, Breast Pain, Nipple Discharge and Skin Changes. Cardiovascular Not Present- Chest Pain, Difficulty Breathing Lying Down, Leg Cramps, Palpitations, Rapid Heart Rate, Shortness of Breath and Swelling of Extremities. Gastrointestinal Not Present- Abdominal Pain, Bloating, Bloody Stool, Change in Bowel Habits, Chronic diarrhea, Constipation, Difficulty Swallowing, Excessive gas, Gets full quickly at meals, Hemorrhoids, Indigestion, Nausea, Rectal Pain and Vomiting. Musculoskeletal Not Present- Back Pain, Joint Pain, Joint Stiffness, Muscle Pain, Muscle Weakness and Swelling of Extremities. Neurological Not Present- Decreased Memory, Fainting, Headaches, Numbness, Seizures, Tingling, Tremor, Trouble walking and Weakness. Psychiatric Not Present- Anxiety, Bipolar, Change in Sleep Pattern, Depression, Fearful and Frequent crying. Endocrine Not Present- Cold Intolerance, Excessive Hunger, Hair Changes, Heat Intolerance and New Diabetes. Hematology Not Present- Blood Thinners, Easy Bruising, Excessive bleeding, Gland problems, HIV and Persistent Infections. All other systems negative     Physical Exam The physical exam findings are as follows: Note:  Constitutional: No acute distress, conversant, appears stated age   Eyes: Anicteric sclerae, moist conjunctiva, no lid lag   Neck: No thyromegaly, trachea midline, no cervical lymphadenopathy   Lungs: Clear to auscultation biilaterally, normal respiratory effot   Cardiovascular: regular rate & rhythm, no murmurs, no peripheal edema, pedal pulses 2+   GI: Soft, no masses or hepatosplenomegaly, non-tender to palpation      MSK: Normal gait, no clubbing cyanosis, edema   Skin: No rashes, palpation reveals normal skin turgor   Psychiatric: Appropriate judgment and insight, oriented to person, place, and time       Assessment & Plan HIATAL HERNIA (K44.9) Impression: Melissa-year-old Mcclure with a moderate size hiatal hernia, chronic reflux 1. Will proceed to the operating room for a robotic hiatal hernia repair with mesh and Toupet fundoplication   2. Discussed with patient the risks and benefits of the procedure to include but not limited to: Infection, bleeding, damage to structures, possible pneumothorax, possible recurrence. The patient voiced understanding and wishes to proceed. 

## 2021-01-27 ENCOUNTER — Other Ambulatory Visit: Payer: Self-pay | Admitting: Hematology

## 2021-02-13 ENCOUNTER — Encounter: Payer: Self-pay | Admitting: Family Medicine

## 2021-02-13 ENCOUNTER — Other Ambulatory Visit: Payer: Self-pay

## 2021-02-13 ENCOUNTER — Ambulatory Visit: Payer: BC Managed Care – PPO | Admitting: Family Medicine

## 2021-02-13 VITALS — BP 132/80 | HR 60 | Temp 98.6°F | Ht 62.0 in | Wt 142.8 lb

## 2021-02-13 DIAGNOSIS — Z0000 Encounter for general adult medical examination without abnormal findings: Secondary | ICD-10-CM | POA: Diagnosis not present

## 2021-02-13 DIAGNOSIS — I10 Essential (primary) hypertension: Secondary | ICD-10-CM | POA: Diagnosis not present

## 2021-02-13 DIAGNOSIS — H8309 Labyrinthitis, unspecified ear: Secondary | ICD-10-CM | POA: Diagnosis not present

## 2021-02-13 MED ORDER — MECLIZINE HCL 25 MG PO TABS: 25.0000 mg | ORAL_TABLET | Freq: Three times a day (TID) | ORAL | 1 refills | Status: DC | PRN

## 2021-02-13 MED ORDER — LOSARTAN POTASSIUM-HCTZ 100-12.5 MG PO TABS: 1.0000 | ORAL_TABLET | Freq: Every day | ORAL | 1 refills | Status: DC

## 2021-02-13 NOTE — Progress Notes (Signed)
New Patient Office Visit  Subjective:  Patient ID: Melissa Mcclure, female    DOB: 18-Apr-1956  Age: 65 y.o. MRN: [id number]  CC:  Chief Complaint  Patient presents with  . Establish Care    Np here and she is fasting for lab work.  Pt has no concerns.    HPI Melissa Mcclure presents for establishment of care by way of transfer.  Her doctor is retiring.  History of hypertension controlled with losartan with HCTZ.  History of hiatal hernia with GERD.  She is still seeing her gastroenterologist Dr. Jovita Kussmaul for this.  She is planning on having a possible Nissen fundoplication for this in July.  History of thoracic scoliosis, status post anterior lateral lumbar fusion L2-L3/L3-L4/L4-L5.  She is still seeing her neurosurgeon Dr. Vertell Limber for this issue.  Sees GYN for well woman care.  History of melanoma.  She continues to see dermatology for [place] chronic skin malady.  Past Medical History:  Diagnosis Date  . Acid reflux    GERD  . Cancer ([place]) 08,11   melanoma-left knee, right arm  . Chronic back pain   . Complication of anesthesia   . Ductal carcinoma in situ (DCIS) of right breast 10/2017  . Family history of adverse reaction to anesthesia    mother has PONV  . Family history of colon cancer   . Family history of melanoma   . Family history of ovarian cancer   . Genetic testing 12/23/2017   STAT Breast panel with reflex to Multi-Cancer panel (83 genes) @ [facility] - No pathogenic mutations detected  . GERD (gastroesophageal reflux disease)   . H/O hiatal hernia   . Heart murmur    no problems  . History of hand surgery   . Hypertension   . PONV (postoperative nausea and vomiting)    "zofran works great  . Seasonal allergies   . SVD (spontaneous vaginal delivery)    x 1  . Vertigo     Past Surgical History:  Procedure Laterality Date  . ANTERIOR LAT LUMBAR FUSION Right 04/17/2013   Procedure: ANTERIOR LATERAL LUMBAR FUSION 3 LEVELS;  Surgeon: Erline Levine, MD;  Location: [facility];  Service: Neurosurgery;  Laterality: Right;  Right Lumbar Two-Three Lumbar Three-Four Lumbar Four-Five  Anterolateral Fusion  . BREAST LUMPECTOMY WITH RADIOACTIVE SEED LOCALIZATION Right 12/25/2017   Procedure: RIGHT BREAST LUMPECTOMY WITH RADIOACTIVE SEED LOCALIZATION;  Surgeon: Fanny Skates, MD;  Location: [facility];  Service: General;  Laterality: Right;  . [place]  . CESAREAN SECTION  1980  . COLONOSCOPY    . COLONOSCOPY WITH PROPOFOL N/A 06/23/2018   Procedure: COLONOSCOPY WITH PROPOFOL;  Surgeon: Clarene Essex, MD;  Location: [facility];  Service: Endoscopy;  Laterality: N/A;  . CYSTOSCOPY N/A 06/26/2016   Procedure: CYSTOSCOPY;  Surgeon: Megan Salon, MD;  Location: [facility];  Service: Gynecology;  Laterality: N/A;  . DILATATION & CURETTAGE/HYSTEROSCOPY WITH MYOSURE N/A 01/24/2016   Procedure: DILATATION & CURETTAGE/HYSTEROSCOPY WITH MYOSURE;  Surgeon: Megan Salon, MD;  Location: [facility];  Service: Gynecology;  Laterality: N/A;  . FOOT SURGERY Right    early 20s-bone spur removal  . LAPAROSCOPIC HYSTERECTOMY Bilateral 06/26/2016   Procedure: HYSTERECTOMY TOTAL LAPAROSCOPIC;  Surgeon: Megan Salon, MD;  Location: [facility];  Service: Gynecology;  Laterality: Bilateral;  . LUMBAR PERCUTANEOUS PEDICLE SCREW 3 LEVEL N/A 04/17/2013   Procedure: LUMBAR PERCUTANEOUS PEDICLE SCREW 3 LEVEL;  Surgeon: Erline Levine, MD;  Location: [facility];  Service: Neurosurgery;  Laterality: N/A;  Decompression and Posterior Lumbar Interbody Fusion of Lumbar Five-Sacral One, Percutaneous Screws Lumbar Two through Sacral One  . MELANOMA EXCISION     knee 08 lft. elbow 11rt  . SALPINGOOPHORECTOMY Bilateral 06/26/2016   Procedure: SALPINGO OOPHORECTOMY;  Surgeon: Megan Salon, MD;  Location: [facility];  Service: Gynecology;  Laterality: Bilateral;  . SHOULDER SURGERY Right 04/28/2015  . STRABISMUS SURGERY Bilateral 07/29/2020   Procedure: BILATERAL STRABISMUS REPAIR;  Surgeon: Everitt Amber, MD;  Location: [place];  Service: Ophthalmology;  Laterality: Bilateral;  . TUBAL LIGATION  1986  . WLE  06/17/07   and left groin lymph node    Family History  Problem Relation Age of Onset  . Diabetes Father        borderline  . Melanoma Father        dx 41s/60s; deceased 45s  . Congestive Heart Failure Father   . Colon cancer Mother        dx 10s; deceased 27  . Hypertension Mother   . Osteoporosis Mother   . Ovarian cancer Maternal Grandmother        deceased 16s  . Heart disease Paternal Grandfather   . Melanoma Daughter        on leg; dx 19s    Social History   Socioeconomic History  . Marital status: Married    Spouse name: Not on file  . Number of children: Not on file  . Years of education: Not on file  . Highest education level: Not on file  Occupational History  . Not on file  Tobacco Use  . Smoking status: Never Smoker  . Smokeless tobacco: Never Used  Vaping Use  . Vaping Use: Never used  Substance and Sexual Activity  . Alcohol use: Yes    Alcohol/week: 0.0 - 1.0 standard drinks  . Drug use: No  . Sexual activity: Not Currently    Partners: Male    Birth control/protection: Surgical, Post-menopausal    Comment: hysterectomy  Other Topics Concern  . Not on file  Social History Narrative  . Not on file   Social Determinants of Health   Financial Resource Strain: Not on file  Food Insecurity: Not on file  Transportation Needs: Not on file  Physical Activity: Not on file  Stress: Not on file  Social Connections: Not on file  Intimate Partner Violence: Not on file    ROS Review of Systems  Objective:   Today's Vitals: Pulse 60   Temp 98.6 F (37 C) (Oral)   Ht 5\' 2"  (1.575 m)   Wt 142 lb 12.8 oz (64.8 kg)   LMP 06/24/2008   SpO2 98%   BMI 26.12 kg/m   Physical Exam Vitals and nursing note reviewed.  Constitutional:      General: She is not in acute distress.    Appearance: Normal appearance. She is  not ill-appearing or toxic-appearing.  HENT:     Head: Normocephalic and atraumatic.     Right Ear: Tympanic membrane, ear canal and external ear normal.     Left Ear: Tympanic membrane, ear canal and external ear normal.     Mouth/Throat:     Mouth: Mucous membranes are moist.     Pharynx: Oropharynx is clear. No oropharyngeal exudate or posterior oropharyngeal erythema.  Eyes:     General: No scleral icterus.       Right eye: No discharge.  Left eye: No discharge.     Extraocular Movements: Extraocular movements intact.     Conjunctiva/sclera: Conjunctivae normal.     Pupils: Pupils are equal, round, and reactive to light.  Neck:     Vascular: No carotid bruit.  Cardiovascular:     Rate and Rhythm: Normal rate and regular rhythm.  Pulmonary:     Effort: Pulmonary effort is normal.     Breath sounds: Normal breath sounds.  Abdominal:     General: Bowel sounds are normal.  Musculoskeletal:     Cervical back: No rigidity or tenderness.     Right lower leg: No edema.     Left lower leg: No edema.  Lymphadenopathy:     Cervical: No cervical adenopathy.  Skin:    General: Skin is warm and dry.       Neurological:     Mental Status: She is alert and oriented to person, place, and time.  Psychiatric:        Mood and Affect: Mood normal.        Behavior: Behavior normal.     Assessment & Plan:   Problem List Items Addressed This Visit      Cardiovascular and Mediastinum   Essential hypertension   Relevant Medications   losartan-hydrochlorothiazide (HYZAAR) 100-12.5 MG tablet   Other Relevant Orders   CBC   Comprehensive metabolic panel   Urinalysis, Routine w reflex microscopic     Nervous and Auditory   Labyrinthitis   Relevant Medications   meclizine (ANTIVERT) 25 MG tablet     Other   Healthcare maintenance - Primary   Relevant Orders   Lipid panel      Outpatient Encounter Medications as of 02/13/2021  Medication Sig  . acetaminophen (TYLENOL)  500 MG tablet Take 1,000 mg by mouth 2 (two) times daily as needed for mild pain. Reported on 01/23/2016  . Ascorbic Acid (VITAMIN C WITH ROSE HIPS) 1000 MG tablet Take 1,000 mg by mouth daily. Reported on 01/23/2016  . Calcium-Vitamin D-Vitamin K 650-12.5-40 MG-MCG-MCG CHEW Chew 1 tablet by mouth daily.  . cetirizine (ZYRTEC) 10 MG tablet Take 10 mg by mouth daily. Reported on 01/23/2016  . ibuprofen (ADVIL,MOTRIN) 200 MG tablet Take 400 mg by mouth daily as needed (for pain.).  Marland Kitchen Lactobacillus Rhamnosus, GG, (CULTURELLE PO) Take 1 capsule by mouth at bedtime.   . Multiple Vitamins-Minerals (ALIVE WOMENS 50+) TABS Take 1 tablet by mouth daily.  . ondansetron (ZOFRAN ODT) 4 MG disintegrating tablet 4mg  ODT q4 hours prn nausea/vomit  . pantoprazole (PROTONIX) 40 MG tablet Take 40 mg by mouth daily. Reported on 01/23/2016  . polyethylene glycol (MIRALAX / GLYCOLAX) packet Take 17 g by mouth at bedtime. Reported on 01/23/2016  . raloxifene (EVISTA) 60 MG tablet TAKE 1 TABLET BY MOUTH EVERY DAY  . traMADol (ULTRAM) 50 MG tablet Take 50 mg by mouth 2 (two) times daily as needed (for pain).   . [DISCONTINUED] diclofenac (VOLTAREN) 75 MG EC tablet Take 75 mg by mouth daily.  . [DISCONTINUED] losartan-hydrochlorothiazide (HYZAAR) 100-12.5 MG per tablet Take 1 tablet by mouth daily. Reported on 01/23/2016  . [DISCONTINUED] meclizine (ANTIVERT) 25 MG tablet Take 25 mg by mouth 3 (three) times daily as needed (vertigo). Reported on 01/23/2016  . losartan-hydrochlorothiazide (HYZAAR) 100-12.5 MG tablet Take 1 tablet by mouth daily. Reported on 01/23/2016  . meclizine (ANTIVERT) 25 MG tablet Take 1 tablet (25 mg total) by mouth 3 (three) times daily as needed (vertigo). Reported on  01/23/2016  . [DISCONTINUED] gabapentin (NEURONTIN) 300 MG capsule Take 1 capsule (300 mg total) by mouth at bedtime. (Patient not taking: Reported on 02/13/2021)  . [DISCONTINUED] hyoscyamine (LEVBID) 0.375 MG 12 hr tablet Take 0.375 mg by mouth 2  (two) times daily. (Patient not taking: Reported on 02/13/2021)  . [DISCONTINUED] venlafaxine XR (EFFEXOR-XR) 75 MG 24 hr capsule Take 1 capsule (75 mg total) by mouth daily with breakfast. Take 1 capsule for 7 days and then 1 capsule every other day for 14 days and then stop (Patient not taking: Reported on 02/13/2021)   No facility-administered encounter medications on file as of 02/13/2021.    Follow-up: Return in about 6 months (around 08/16/2021).   Mliss Sax, MD

## 2021-02-14 LAB — CBC
HCT: 37.7 % (ref 36.0–46.0)
Hemoglobin: 12.7 g/dL (ref 12.0–15.0)
MCHC: 33.7 g/dL (ref 30.0–36.0)
MCV: 94.3 fl (ref 78.0–100.0)
Platelets: 243 10*3/uL (ref 150.0–400.0)
RBC: 4 Mil/uL (ref 3.87–5.11)
RDW: 13.5 % (ref 11.5–15.5)
WBC: 4.8 10*3/uL (ref 4.0–10.5)

## 2021-02-14 LAB — COMPREHENSIVE METABOLIC PANEL
ALT: 24 U/L (ref 0–35)
AST: 22 U/L (ref 0–37)
Albumin: 4.1 g/dL (ref 3.5–5.2)
Alkaline Phosphatase: 52 U/L (ref 39–117)
BUN: 22 mg/dL (ref 6–23)
CO2: 26 mEq/L (ref 19–32)
Calcium: 9.8 mg/dL (ref 8.4–10.5)
Chloride: 106 mEq/L (ref 96–112)
Creatinine, Ser: 0.68 mg/dL (ref 0.40–1.20)
GFR: 90.87 mL/min (ref >60.00)
Glucose, Bld: 89 mg/dL (ref 70–99)
Potassium: 4 mEq/L (ref 3.5–5.1)
Sodium: 142 mEq/L (ref 135–145)
Total Bilirubin: 0.6 mg/dL (ref 0.2–1.2)
Total Protein: 6.2 g/dL (ref 6.0–8.3)

## 2021-02-14 LAB — LIPID PANEL
Cholesterol: 207 mg/dL — ABNORMAL HIGH (ref 0–200)
HDL: 77 mg/dL (ref >39.00)
LDL Cholesterol: 116 mg/dL — ABNORMAL HIGH (ref 0–99)
NonHDL: 129.55
Total CHOL/HDL Ratio: 3
Triglycerides: 66 mg/dL (ref 0.0–149.0)
VLDL: 13.2 mg/dL (ref 0.0–40.0)

## 2021-03-22 DIAGNOSIS — M419 Scoliosis, unspecified: Secondary | ICD-10-CM | POA: Diagnosis not present

## 2021-03-22 DIAGNOSIS — Z6825 Body mass index (BMI) 25.0-25.9, adult: Secondary | ICD-10-CM | POA: Diagnosis not present

## 2021-03-22 DIAGNOSIS — I10 Essential (primary) hypertension: Secondary | ICD-10-CM | POA: Diagnosis not present

## 2021-03-22 DIAGNOSIS — M545 Low back pain, unspecified: Secondary | ICD-10-CM | POA: Diagnosis not present

## 2021-03-22 NOTE — Progress Notes (Signed)
Surgical Instructions    Your procedure is scheduled on Tuesday July 12th.  Report to [facility] Main Entrance "A" at 05:30 A.M., then check in with the Admitting office.  Call this number if you have problems the morning of surgery:  [phone]   If you have any questions prior to your surgery date call [phone]: Open Monday-Friday 8am-4pm    Remember:  Do not eat after midnight the night before your surgery  You may drink clear liquids until 04:30 A.M. the morning of your surgery.   Clear liquids allowed are: Water, Non-Citrus Juices (without pulp), Carbonated Beverages, Clear Tea, Black Coffee Only, and Gatorade Patient Instructions  The night before surgery:  No food after midnight. ONLY clear liquids after midnight  The day of surgery (if you do NOT have diabetes):  Drink ONE (1) Pre-Surgery Clear Ensure by 04:30 A.M. the morning of surgery. Drink in one sitting. Do not sip.  This drink was given to you during your hospital  pre-op appointment visit.  Nothing else to drink after completing the  Pre-Surgery Clear Ensure.        If you have questions, please contact your surgeon's office.     Take these medicines the morning of surgery with A SIP OF WATER   acetaminophen (TYLENOL)- If needed  cetirizine (ZYRTEC)   meclizine (ANTIVERT)- If needed  ondansetron (ZOFRAN-ODT)- If needed  pantoprazole (PROTONIX)  raloxifene (EVISTA)  traMADol (ULTRAM)- If needed  As of today, STOP taking any Aspirin (unless otherwise instructed by your surgeon) Aleve, Naproxen, Ibuprofen, Motrin, Advil, Goody's, BC's, all herbal medications, fish oil, and all vitamins.                     Do NOT Smoke (Tobacco/Vaping) or drink Alcohol 24 hours prior to your procedure.  If you use a CPAP at night, you may bring all equipment for your overnight stay.   Contacts, glasses, piercing's, hearing aid's, dentures or partials may not be worn into surgery, please bring cases for these  belongings.    For patients admitted to the hospital, discharge time will be determined by your treatment team.   Patients discharged the day of surgery will not be allowed to drive home, and someone needs to stay with them for 24 hours.  ONLY 1 SUPPORT PERSON MAY BE PRESENT WHILE YOU ARE IN SURGERY. IF YOU ARE TO BE ADMITTED ONCE YOU ARE IN YOUR ROOM YOU WILL BE ALLOWED TWO (2) VISITORS.  Minor children may have two parents present. Special consideration for safety and communication needs will be reviewed on a case by case basis.   Special instructions:   [name]- Preparing For Surgery  Before surgery, you can play an important role. Because skin is not sterile, your skin needs to be as free of germs as possible. You can reduce the number of germs on your skin by washing with CHG (chlorahexidine gluconate) Soap before surgery.  CHG is an antiseptic cleaner which kills germs and bonds with the skin to continue killing germs even after washing.    Oral Hygiene is also important to reduce your risk of infection.  Remember - BRUSH YOUR TEETH THE MORNING OF SURGERY WITH YOUR REGULAR TOOTHPASTE  Please do not use if you have an allergy to CHG or antibacterial soaps. If your skin becomes reddened/irritated stop using the CHG.  Do not shave (including legs and underarms) for at least 48 hours prior to first CHG shower. It is OK to  shave your face.  Please follow these instructions carefully.   Shower the NIGHT BEFORE SURGERY and the MORNING OF SURGERY  If you chose to wash your hair, wash your hair first as usual with your normal shampoo.  After you shampoo, rinse your hair and body thoroughly to remove the shampoo.  Use CHG Soap as you would any other liquid soap. You can apply CHG directly to the skin and wash gently with a scrungie or a clean washcloth.   Apply the CHG Soap to your body ONLY FROM THE NECK DOWN.  Do not use on open wounds or open sores. Avoid contact with your eyes, ears,  mouth and genitals (private parts). Wash Face and genitals (private parts)  with your normal soap.   Wash thoroughly, paying special attention to the area where your surgery will be performed.  Thoroughly rinse your body with warm water from the neck down.  DO NOT shower/wash with your normal soap after using and rinsing off the CHG Soap.  Pat yourself dry with a CLEAN TOWEL.  Wear CLEAN PAJAMAS to bed the night before surgery  Place CLEAN SHEETS on your bed the night before your surgery  DO NOT SLEEP WITH PETS.   Day of Surgery: Shower with CHG soap. Do not wear jewelry, make up, nail polish, gel polish, artificial nails, or any other type of covering on natural nails including finger and toenails. If patients have artificial nails, gel coating, etc. that need to be removed by a nail salon please have this removed prior to surgery. Surgery may need to be canceled/delayed if the surgeon/ anesthesia feels like the patient is unable to be adequately monitored. Do not wear lotions, powders, perfumes/colognes, or deodorant. Do not shave 48 hours prior to surgery.  Men may shave face and neck. Do not bring valuables to the hospital. [facility] is not responsible for any belongings or valuables. Wear Clean/Comfortable clothing the morning of surgery Remember to brush your teeth WITH YOUR REGULAR TOOTHPASTE.   Please read over the following fact sheets that you were given.

## 2021-03-23 ENCOUNTER — Other Ambulatory Visit: Payer: Self-pay

## 2021-03-23 ENCOUNTER — Encounter (HOSPITAL_COMMUNITY): Payer: Self-pay

## 2021-03-23 ENCOUNTER — Encounter (HOSPITAL_COMMUNITY)
Admission: RE | Admit: 2021-03-23 | Discharge: 2021-03-23 | Disposition: A | Discharge status: Home / Self Care | Payer: BC Managed Care – PPO | Source: Ambulatory Visit | Attending: General Surgery | Admitting: General Surgery

## 2021-03-23 DIAGNOSIS — Z01818 Encounter for other preprocedural examination: Secondary | ICD-10-CM | POA: Insufficient documentation

## 2021-03-23 DIAGNOSIS — Z8249 Family history of ischemic heart disease and other diseases of the circulatory system: Secondary | ICD-10-CM | POA: Diagnosis not present

## 2021-03-23 DIAGNOSIS — Z79891 Long term (current) use of opiate analgesic: Secondary | ICD-10-CM | POA: Insufficient documentation

## 2021-03-23 DIAGNOSIS — K449 Diaphragmatic hernia without obstruction or gangrene: Secondary | ICD-10-CM | POA: Insufficient documentation

## 2021-03-23 DIAGNOSIS — R053 Chronic cough: Secondary | ICD-10-CM | POA: Diagnosis not present

## 2021-03-23 DIAGNOSIS — I10 Essential (primary) hypertension: Secondary | ICD-10-CM | POA: Insufficient documentation

## 2021-03-23 DIAGNOSIS — M419 Scoliosis, unspecified: Secondary | ICD-10-CM | POA: Insufficient documentation

## 2021-03-23 DIAGNOSIS — K219 Gastro-esophageal reflux disease without esophagitis: Secondary | ICD-10-CM | POA: Diagnosis not present

## 2021-03-23 DIAGNOSIS — R0600 Dyspnea, unspecified: Secondary | ICD-10-CM | POA: Diagnosis not present

## 2021-03-23 DIAGNOSIS — Z85820 Personal history of malignant melanoma of skin: Secondary | ICD-10-CM | POA: Insufficient documentation

## 2021-03-23 DIAGNOSIS — R011 Cardiac murmur, unspecified: Secondary | ICD-10-CM | POA: Diagnosis not present

## 2021-03-23 DIAGNOSIS — Z791 Long term (current) use of non-steroidal anti-inflammatories (NSAID): Secondary | ICD-10-CM | POA: Insufficient documentation

## 2021-03-23 DIAGNOSIS — Z79899 Other long term (current) drug therapy: Secondary | ICD-10-CM | POA: Diagnosis not present

## 2021-03-23 DIAGNOSIS — R600 Localized edema: Secondary | ICD-10-CM | POA: Insufficient documentation

## 2021-03-23 DIAGNOSIS — G8929 Other chronic pain: Secondary | ICD-10-CM | POA: Diagnosis not present

## 2021-03-23 DIAGNOSIS — Z853 Personal history of malignant neoplasm of breast: Secondary | ICD-10-CM | POA: Insufficient documentation

## 2021-03-23 HISTORY — DX: Unspecified osteoarthritis, unspecified site: M19.90

## 2021-03-23 LAB — BASIC METABOLIC PANEL
Anion gap: 7 (ref 5–15)
BUN: 20 mg/dL (ref 8–23)
CO2: 29 mmol/L (ref 22–32)
Calcium: 9.7 mg/dL (ref 8.9–10.3)
Chloride: 102 mmol/L (ref 98–111)
Creatinine, Ser: 0.82 mg/dL (ref 0.44–1.00)
GFR, Estimated: >60 mL/min (ref >60)
Glucose, Bld: 94 mg/dL (ref 70–99)
Potassium: 3.9 mmol/L (ref 3.5–5.1)
Sodium: 138 mmol/L (ref 135–145)

## 2021-03-23 LAB — CBC
HCT: 37.6 % (ref 36.0–46.0)
Hemoglobin: 12.6 g/dL (ref 12.0–15.0)
MCH: 32 pg (ref 26.0–34.0)
MCHC: 33.5 g/dL (ref 30.0–36.0)
MCV: 95.4 fL (ref 80.0–100.0)
Platelets: 241 10*3/uL (ref 150–400)
RBC: 3.94 MIL/uL (ref 3.87–5.11)
RDW: 12.2 % (ref 11.5–15.5)
WBC: 5.3 10*3/uL (ref 4.0–10.5)
nRBC: 0 % (ref 0.0–0.2)

## 2021-03-23 NOTE — Progress Notes (Signed)
Anesthesia PAT Evaluation:  Case: [id number] Date/Time: 04/04/21 0715   Procedure: ROBOTIC HIATAL HERNIA REPAIR WITH FUNDOPLICATION   Anesthesia type: General   Pre-op diagnosis: Hiatal Hernia   Location: [facility] OR ROOM 10 / [place] [facility]   Surgeons: Ralene Ok, MD       DISCUSSION: Patient is a 65 year old female scheduled for the above procedure.  History includes never smoker, post-operative N/V, HTN, GERD, hiatal hernia, murmur (no significant valvular disease 11/2020 echo), melanoma (s/p excisions from LLE, RUE), breast cancer (DCIS, right breast lumpectomy 12/25/17), scoliosis with chronic back pain, back surgery (L2-5 ALIF 04/17/13), exotropia (s/p bilateral lateral rectus muscle recession 08/08/20).   Patient evaluated at her PAT visit due to reports of worsening dyspnea over the past several months. (She reported that multiple providers have felt symptoms were related to her large hiatal hernia and significant scoliosis. See next paragraph.) Dyspnea is primarily with exertion. She had some DOE at baseline, but with gradual increase since at around March. She had taken a beach trip with her sister who commented about the SOB when walking from the condo to the beach. She gets SOB with walking up to her second floor of her house. She can sometimes take short walks with her husband but has to rest more often now. She admits that her activity has been limited for > 1 year due to her chronic back pain and scoliosis. She has a chronic cough, primarily in the mornings with clear sputum that has been attributed to allergies and overall feels it is stable. She gets intermittent BLE edema , right greater than left. She is on losartan/HCTZ, but otherwise no other diuretic therapy. She has a Tempur-Pedic bed and sleeps with her head up about 20 degrees and legs elevated due to her back issues. No chest pain, dizziness, or syncope. Her father had CHF and ESRD diagnosed > age 3. One grandfather died of an MI in his 11's  and another had either a MI or CVA but unsure at what age.   She says that she discussed her progressive dyspnea with multiple providers with work-up including echocardiogram, CXR, and CT scans. She reported initial evaluation at [facility] on [address] because she could not get in to see her former PCP Dr. Maury Dus. She says a CXR was done showing mild cardiomegaly and a large hiatal hernia. Dr. Burr Medico also ordered an echo to further evaluate and this showed normal BiV function, no LVH, no significant valvular disease, and normal PASP.  She was referred to Dr. Rosendo Gros who ordered a CT scan which confirmed a large hiatal hernia with the entire gastric fundus and portion of the mid body above the left hemidiaphragm. She reports being told her dyspnea was likely caused by her large hiatal hernia extending into the left chest and her significant scoliosis ,and not felt cardiac related. She reported event her neurosurgeon commented that her lungs appeared compressed on imaging. EKG repeated at PAT due to DOE since her last tracing in November. Overall tracing appears stable without significant ST/T wave abnormalities noted.   Patient also recently established with a new PCP Libby Maw, MD, seen on 02/13/21. He is aware that hiatal hernia surgery was likely planned in July.    Discussed above with anesthesiologist Suella Broad, MD. He also reviewed recent CT imaging. Patient's activity is somewhat limited due to her back issues, but she does do some short walking and can go up a flight of  stairs without chest pain, but does have DOE. Recent unremarkable echo and stable EKG. Non-smoker, no known DM or CAD. Dyspnea has been attributed to large hiatal hernia extending into her left chest and her scoliosis. She has seen multiple providers about this. If no acute changes then it is anticipated that she can proceed as planned.  Anesthesia team to re-evaluate on the day of surgery,  and definitive anesthesia plan made at that time. In the interim, discussed with patient to contact providers if she has any new/progressive symptoms or home BP monitoring reveals significantly elevated readings (ie, DBP > 100).    Preoperative COVID-19 test is scheduled for 03/31/2021.   VS: BP (!) 160/89   Pulse 65   Temp 36.6 C (Oral)   Resp 18   Ht 5\' 2"  (1.575 m)   Wt 65.1 kg   LMP 06/24/2008   SpO2 98%   BMI 26.25 kg/m  BP 153/103-->146/98-->160/89. Reports she recently started home BP monitoring and thinks her BP was ~ 130's/80's. She will monitor and contact her PCP if DBP > 100.  Provider wore N95 and universal face mask. Patient wore face mask. No conversational dyspnea noted. A&O x4. No significant LE edema. Lungs clear. Heart RRR, no murmur noted. No carotid bruits noted.    PROVIDERS: Libby Maw, MD is PCP. Established care on 02/13/21.  Truitt Merle, MD is HEM-ONC Erline Levine, MD is Neurosurgeon Clarene Essex, MD is GI   LABS: Labs reviewed: Acceptable for surgery. (all labs ordered are listed, but only abnormal results are displayed)  Labs Reviewed  BASIC METABOLIC PANEL  CBC     IMAGES: DG Upper GI 01/09/21:  IMPRESSION: 1. Large hiatal hernia.  Mild gastroesophageal reflux elicited. 2. Mild reflux esophagitis. No evidence of esophageal mass or stricture. 3. Severe esophageal dysmotility, suggestive of chronic reflux related dysmotility. 4. Otherwise normal upper GI.   CT Chest/abd/pelvis 01/06/21: -  Cardiovascular: Borderline cardiomegaly. No pericardial effusion. Normal caliber of the thoracic aorta. No evidence of intramural hematoma. Scattered atherosclerotic plaque within the aortic arch. Normal caliber the main pulmonary artery. IMPRESSION: 1. Large hiatal hernia with the entirety of the gastric fungus and a portion of the mid body of the stomach located above the level of the left hemidiaphragm. 2. Extremely large colonic stool burden  without evidence of enteric obstruction. 3. Colonic diverticulosis without evidence superimposed acute diverticulitis. 4. Bilateral nonobstructing nephrolithiasis. 5. Borderline cardiomegaly. 6. Aortic Atherosclerosis (ICD10-I70.0).  MRI T/L Spine 09/19/20: MPRESSION: MR THORACIC SPINE  1. Small disc bulges at multiple levels without significant canal stenosis, as described above. 2. Multilevel left eccentric facet hypertrophy in the lower thoracic spine with suspected moderate left foraminal stenosis at T10-T11. 3. Scoliosis without evidence of acute abnormality. 4. Similar mild dilation of the central canal at C7-T1 and T6-T7. 5. Large hiatal hernia. MR LUMBAR SPINE L2 through S1 fusion without evidence of significant canal or foraminal stenosis at these levels. No substantial change in adjacent level degenerative change at L1-L2 with retrolisthesis, right subarticular recess narrowing, and moderate right foraminal stenosis.   EKG: 03/23/21: Sinus bradycardia at 59 bpm Low voltage QRS Borderline ECG   CV: Echo 12/15/20: IMPRESSIONS   1. Left ventricular ejection fraction, by estimation, is 60 to 65%. The  left ventricle has normal function. The left ventricle has no regional  wall motion abnormalities. Left ventricular diastolic parameters were  normal. The average left ventricular  global longitudinal strain is -27.3 %. The global longitudinal strain is  normal. There is no left ventricular hypertrophy.  2. Right ventricular systolic function is normal. The right ventricular  size is normal. There is normal pulmonary artery systolic pressure.   3. The mitral valve is normal in structure. No evidence of mitral valve  regurgitation. No evidence of mitral stenosis.   4. The aortic valve is normal in structure. Aortic valve regurgitation is  not visualized. No aortic stenosis is present.   5. The inferior vena cava is normal in size with greater than 50%  respiratory  variability, suggesting right atrial pressure of 3 mmHg.  - Conclusion(s)/Recommendation(s): Normal biventricular function without  evidence of hemodynamically significant valvular heart disease.    Past Medical History:  Diagnosis Date   Acid reflux    GERD   Arthritis    Cancer ([place]) 04/2010   melanoma-left knee, right arm   Chronic back pain    Complication of anesthesia    Ductal carcinoma in situ (DCIS) of right breast 10/2017   Family history of adverse reaction to anesthesia    mother has PONV   Family history of colon cancer    Family history of melanoma    Family history of ovarian cancer    Genetic testing 12/23/2017   STAT Breast panel with reflex to Multi-Cancer panel (83 genes) @ [facility] - No pathogenic mutations detected   GERD (gastroesophageal reflux disease)    H/O hiatal hernia    Heart murmur    no problems   History of hand surgery    Hypertension    PONV (postoperative nausea and vomiting)    "zofran works great   Seasonal allergies    SVD (spontaneous vaginal delivery)    x 1   Vertigo     Past Surgical History:  Procedure Laterality Date   ABDOMINAL HYSTERECTOMY     ANTERIOR LAT LUMBAR FUSION Right 04/17/2013   Procedure: ANTERIOR LATERAL LUMBAR FUSION 3 LEVELS;  Surgeon: Erline Levine, MD;  Location: [place] [facility];  Service: Neurosurgery;  Laterality: Right;  Right Lumbar Two-Three Lumbar Three-Four Lumbar Four-Five  Anterolateral Fusion   BACK SURGERY     BREAST LUMPECTOMY WITH RADIOACTIVE SEED LOCALIZATION Right 12/25/2017   Procedure: RIGHT BREAST LUMPECTOMY WITH RADIOACTIVE SEED LOCALIZATION;  Surgeon: Fanny Skates, MD;  Location: [place];  Service: General;  Laterality: Right;   [place]   COLONOSCOPY     COLONOSCOPY WITH PROPOFOL N/A 06/23/2018   Procedure: COLONOSCOPY WITH PROPOFOL;  Surgeon: Clarene Essex, MD;  Location: [facility];  Service: Endoscopy;   Laterality: N/A;   CYSTOSCOPY N/A 06/26/2016   Procedure: CYSTOSCOPY;  Surgeon: Megan Salon, MD;  Location: [place] [facility];  Service: Gynecology;  Laterality: N/A;   DILATATION & CURETTAGE/HYSTEROSCOPY WITH MYOSURE N/A 01/24/2016   Procedure: DILATATION & CURETTAGE/HYSTEROSCOPY WITH MYOSURE;  Surgeon: Megan Salon, MD;  Location: [place] [facility];  Service: Gynecology;  Laterality: N/A;   FOOT SURGERY Right    early 20s-bone spur removal   HAND SURGERY Bilateral    for basal joint arthritis   LAPAROSCOPIC HYSTERECTOMY Bilateral 06/26/2016   Procedure: HYSTERECTOMY TOTAL LAPAROSCOPIC;  Surgeon: Megan Salon, MD;  Location: [facility];  Service: Gynecology;  Laterality: Bilateral;   LUMBAR PERCUTANEOUS PEDICLE SCREW 3 LEVEL N/A 04/17/2013   Procedure: LUMBAR PERCUTANEOUS PEDICLE SCREW 3 LEVEL;  Surgeon: Erline Levine, MD;  Location: [place] [facility];  Service: Neurosurgery;  Laterality: N/A;  Decompression and  Posterior Lumbar Interbody Fusion of Lumbar Five-Sacral One, Percutaneous Screws Lumbar Two through Sacral One   MELANOMA EXCISION     knee 08 lft. elbow 11rt   SALPINGOOPHORECTOMY Bilateral 06/26/2016   Procedure: SALPINGO OOPHORECTOMY;  Surgeon: Megan Salon, MD;  Location: [place] [facility];  Service: Gynecology;  Laterality: Bilateral;   SHOULDER SURGERY Right 04/28/2015   STRABISMUS SURGERY Bilateral 07/29/2020   Procedure: BILATERAL STRABISMUS REPAIR;  Surgeon: Everitt Amber, MD;  Location: [place];  Service: Ophthalmology;  Laterality: Bilateral;   TUBAL LIGATION  1986   WLE  06/17/2007   and left groin lymph node    MEDICATIONS:  acetaminophen (TYLENOL) 500 MG tablet   Ascorbic Acid (VITAMIN C WITH ROSE HIPS) 1000 MG tablet   Calcium-Vitamin D-Vitamin K (VIACTIV CALCIUM PLUS D PO)   cetirizine (ZYRTEC) 10 MG tablet   diclofenac (VOLTAREN) 75 MG EC tablet   ibuprofen (ADVIL,MOTRIN) 200 MG tablet   losartan-hydrochlorothiazide (HYZAAR) 100-12.5 MG tablet   meclizine (ANTIVERT) 25 MG  tablet   Multiple Vitamins-Minerals (ALIVE WOMENS 50+) TABS   ondansetron (ZOFRAN ODT) 4 MG disintegrating tablet   ondansetron (ZOFRAN-ODT) 8 MG disintegrating tablet   pantoprazole (PROTONIX) 40 MG tablet   polyethylene glycol (MIRALAX / GLYCOLAX) packet   Probiotic Product (ALIGN) 4 MG CAPS   raloxifene (EVISTA) 60 MG tablet   traMADol (ULTRAM) 50 MG tablet   No current facility-administered medications for this encounter.    Myra Gianotti, PA-C Surgical Short Stay/Anesthesiology [facility] Phone [phone] [facility] Phone [phone] 03/23/2021 5:44 PM

## 2021-03-23 NOTE — Progress Notes (Signed)
PCP - Libby Maw Cardiologist - denies  PPM/ICD - denies Device Orders -  Rep Notified -   Chest x-ray - no  EKG - 03/23/21 Stress Test - denies ECHO - 12/15/20 Cardiac Cath - denies  Sleep Study - denies CPAP -   Fasting Blood Sugar -n/a  Checks Blood Sugar _____ times a day  Blood Thinner Instructions:n/a Aspirin Instructions:n/a  ERAS Protcol -clear liquids until 0430 PRE-SURGERY Ensure or G2- Ensure  COVID TEST- n/a   Anesthesia review: yes  Patient reported shortness of breath, but denied fever, cough and chest pain at PAT appointment. Pt's BP elevated at PAT appointment- 153/103,146/98,and 160/89. Pt stated that she had taken BP med that morning. Asked Myra Gianotti PA to see pt because of pt's elevated BP and shortness of breath. Ebony Hail came to office and saw patient.    All instructions explained to the patient, with a verbal understanding of the material. Patient agrees to go over the instructions while at home for a better understanding. Patient also instructed to self quarantine after being tested for COVID-19. The opportunity to ask questions was provided.

## 2021-03-24 NOTE — Anesthesia Preprocedure Evaluation (Addendum)
Anesthesia Evaluation  Patient identified by MRN, date of birth, ID band Patient awake    Reviewed: Allergy & Precautions, NPO status , Patient's Chart, lab work & pertinent test results  History of Anesthesia Complications (+) PONV, Family history of anesthesia reaction and history of anesthetic complications  Airway Mallampati: II  TM Distance: >3 FB Neck ROM: Full    Dental  (+) Teeth Intact, Dental Advisory Given, Caps,    Pulmonary neg pulmonary ROS,    Pulmonary exam normal breath sounds clear to auscultation       Cardiovascular hypertension, Pt. on medications Normal cardiovascular exam Rhythm:Regular Rate:Normal     Neuro/Psych negative neurological ROS     GI/Hepatic Neg liver ROS, hiatal hernia, GERD  Medicated,  Endo/Other  negative endocrine ROS  Renal/GU negative Renal ROS     Musculoskeletal  (+) Arthritis ,   Abdominal   Peds  Hematology negative hematology ROS (+)   Anesthesia Other Findings Day of surgery medications reviewed with the patient.  Reproductive/Obstetrics                           Anesthesia Physical Anesthesia Plan  ASA: 2  Anesthesia Plan: General   Post-op Pain Management:    Induction: Intravenous  PONV Risk Score and Plan: 4 or greater and Midazolam, Dexamethasone and Ondansetron  Airway Management Planned: Oral ETT  Additional Equipment:   Intra-op Plan:   Post-operative Plan: Extubation in OR  Informed Consent: I have reviewed the patients History and Physical, chart, labs and discussed the procedure including the risks, benefits and alternatives for the proposed anesthesia with the patient or authorized representative who has indicated his/her understanding and acceptance.     Dental advisory given  Plan Discussed with: CRNA  Anesthesia Plan Comments: (PAT note written by Myra Gianotti, PA-C. )       Anesthesia Quick  Evaluation

## 2021-03-31 ENCOUNTER — Other Ambulatory Visit (HOSPITAL_COMMUNITY)
Admission: RE | Admit: 2021-03-31 | Discharge: 2021-03-31 | Disposition: A | Discharge status: Home / Self Care | Payer: BC Managed Care – PPO | Source: Ambulatory Visit | Attending: General Surgery | Admitting: General Surgery

## 2021-03-31 DIAGNOSIS — Z20822 Contact with and (suspected) exposure to covid-19: Secondary | ICD-10-CM | POA: Insufficient documentation

## 2021-03-31 DIAGNOSIS — Z01812 Encounter for preprocedural laboratory examination: Secondary | ICD-10-CM | POA: Diagnosis not present

## 2021-03-31 LAB — SARS CORONAVIRUS 2 (TAT 6-24 HRS): SARS Coronavirus 2: NEGATIVE

## 2021-04-04 ENCOUNTER — Encounter (HOSPITAL_COMMUNITY): Payer: Self-pay | Admitting: General Surgery

## 2021-04-04 ENCOUNTER — Other Ambulatory Visit: Payer: Self-pay

## 2021-04-04 ENCOUNTER — Observation Stay (HOSPITAL_COMMUNITY)
Admission: RE | Admit: 2021-04-04 | Discharge: 2021-04-06 | Disposition: A | Discharge status: Home / Self Care | Payer: BC Managed Care – PPO | Attending: General Surgery | Admitting: General Surgery

## 2021-04-04 ENCOUNTER — Inpatient Hospital Stay (HOSPITAL_COMMUNITY): Payer: BC Managed Care – PPO

## 2021-04-04 ENCOUNTER — Encounter (HOSPITAL_COMMUNITY)
Admission: RE | Disposition: A | Discharge status: Home / Self Care | Payer: Self-pay | Source: Home / Self Care | Attending: General Surgery

## 2021-04-04 ENCOUNTER — Ambulatory Visit (HOSPITAL_COMMUNITY): Payer: BC Managed Care – PPO

## 2021-04-04 ENCOUNTER — Ambulatory Visit (HOSPITAL_COMMUNITY): Payer: BC Managed Care – PPO | Admitting: Vascular Surgery

## 2021-04-04 DIAGNOSIS — J302 Other seasonal allergic rhinitis: Secondary | ICD-10-CM | POA: Diagnosis not present

## 2021-04-04 DIAGNOSIS — K449 Diaphragmatic hernia without obstruction or gangrene: Secondary | ICD-10-CM | POA: Diagnosis not present

## 2021-04-04 DIAGNOSIS — Z9889 Other specified postprocedural states: Secondary | ICD-10-CM

## 2021-04-04 DIAGNOSIS — J439 Emphysema, unspecified: Secondary | ICD-10-CM | POA: Diagnosis not present

## 2021-04-04 DIAGNOSIS — Z8719 Personal history of other diseases of the digestive system: Secondary | ICD-10-CM

## 2021-04-04 DIAGNOSIS — D0511 Intraductal carcinoma in situ of right breast: Secondary | ICD-10-CM | POA: Diagnosis not present

## 2021-04-04 DIAGNOSIS — I10 Essential (primary) hypertension: Secondary | ICD-10-CM | POA: Diagnosis not present

## 2021-04-04 DIAGNOSIS — R0600 Dyspnea, unspecified: Secondary | ICD-10-CM

## 2021-04-04 HISTORY — PX: XI ROBOTIC ASSISTED HIATAL HERNIA REPAIR: SHX6889

## 2021-04-04 SURGERY — REPAIR, HERNIA, HIATAL, ROBOT-ASSISTED
Anesthesia: General | Site: Abdomen

## 2021-04-04 MED ORDER — CHLORHEXIDINE GLUCONATE 0.12 % MT SOLN: 15.0000 mL | Freq: Once | OROMUCOSAL | Status: AC

## 2021-04-04 MED ORDER — CHLORHEXIDINE GLUCONATE 0.12 % MT SOLN
OROMUCOSAL | Status: AC
  Administered 2021-04-04: 15 mL via OROMUCOSAL
  Filled 2021-04-04: qty 15

## 2021-04-04 MED ORDER — PROPOFOL 500 MG/50ML IV EMUL
INTRAVENOUS | Status: DC | PRN
  Administered 2021-04-04: 25 ug/kg/min via INTRAVENOUS

## 2021-04-04 MED ORDER — ACETAMINOPHEN 500 MG PO TABS: 1000.0000 mg | ORAL_TABLET | ORAL | Status: DC

## 2021-04-04 MED ORDER — BUPIVACAINE LIPOSOME 1.3 % IJ SUSP
INTRAMUSCULAR | Status: DC | PRN
  Administered 2021-04-04: 40 mL

## 2021-04-04 MED ORDER — FENTANYL CITRATE (PF) 250 MCG/5ML IJ SOLN
INTRAMUSCULAR | Status: DC | PRN
  Administered 2021-04-04: 100 ug via INTRAVENOUS
  Administered 2021-04-04: 25 ug via INTRAVENOUS

## 2021-04-04 MED ORDER — MIDAZOLAM HCL 5 MG/5ML IJ SOLN
INTRAMUSCULAR | Status: DC | PRN
  Administered 2021-04-04: 1 mg via INTRAVENOUS

## 2021-04-04 MED ORDER — BUPIVACAINE-EPINEPHRINE 0.25% -1:200000 IJ SOLN
INTRAMUSCULAR | Status: DC | PRN
  Administered 2021-04-04: 15 mL

## 2021-04-04 MED ORDER — SCOPOLAMINE 1 MG/3DAYS TD PT72
MEDICATED_PATCH | TRANSDERMAL | Status: AC
  Filled 2021-04-04: qty 1

## 2021-04-04 MED ORDER — ONDANSETRON HCL 4 MG/2ML IJ SOLN
4.0000 mg | Freq: Four times a day (QID) | INTRAMUSCULAR | Status: DC | PRN
  Administered 2021-04-05: 4 mg via INTRAVENOUS
  Filled 2021-04-04: qty 2

## 2021-04-04 MED ORDER — DEXMEDETOMIDINE (PRECEDEX) IN NS 20 MCG/5ML (4 MCG/ML) IV SYRINGE
PREFILLED_SYRINGE | INTRAVENOUS | Status: AC
  Filled 2021-04-04: qty 5

## 2021-04-04 MED ORDER — CHLORHEXIDINE GLUCONATE CLOTH 2 % EX PADS: 6.0000 | MEDICATED_PAD | Freq: Once | CUTANEOUS | Status: DC

## 2021-04-04 MED ORDER — CEFAZOLIN SODIUM 1 G IJ SOLR
INTRAMUSCULAR | Status: AC
  Filled 2021-04-04: qty 20

## 2021-04-04 MED ORDER — PHENYLEPHRINE 40 MCG/ML (10ML) SYRINGE FOR IV PUSH (FOR BLOOD PRESSURE SUPPORT)
PREFILLED_SYRINGE | INTRAVENOUS | Status: DC | PRN
  Administered 2021-04-04: 120 ug via INTRAVENOUS

## 2021-04-04 MED ORDER — BUPIVACAINE LIPOSOME 1.3 % IJ SUSP
INTRAMUSCULAR | Status: AC
  Filled 2021-04-04: qty 20

## 2021-04-04 MED ORDER — LIDOCAINE 2% (20 MG/ML) 5 ML SYRINGE
INTRAMUSCULAR | Status: AC
  Filled 2021-04-04: qty 5

## 2021-04-04 MED ORDER — HYDROMORPHONE HCL 1 MG/ML IJ SOLN
INTRAMUSCULAR | Status: AC
  Filled 2021-04-04: qty 1

## 2021-04-04 MED ORDER — DEXAMETHASONE SODIUM PHOSPHATE 10 MG/ML IJ SOLN
INTRAMUSCULAR | Status: AC
  Filled 2021-04-04: qty 1

## 2021-04-04 MED ORDER — LACTATED RINGERS IV SOLN: INTRAVENOUS | Status: DC

## 2021-04-04 MED ORDER — ONDANSETRON HCL 4 MG/2ML IJ SOLN
INTRAMUSCULAR | Status: DC | PRN
  Administered 2021-04-04: 4 mg via INTRAVENOUS

## 2021-04-04 MED ORDER — PROPOFOL 10 MG/ML IV BOLUS
INTRAVENOUS | Status: AC
  Filled 2021-04-04: qty 20

## 2021-04-04 MED ORDER — ONDANSETRON 4 MG PO TBDP
4.0000 mg | ORAL_TABLET | Freq: Four times a day (QID) | ORAL | Status: DC | PRN
  Administered 2021-04-06: 4 mg via ORAL
  Filled 2021-04-04: qty 1

## 2021-04-04 MED ORDER — ORAL CARE MOUTH RINSE: 15.0000 mL | Freq: Once | OROMUCOSAL | Status: AC

## 2021-04-04 MED ORDER — FENTANYL CITRATE (PF) 250 MCG/5ML IJ SOLN
INTRAMUSCULAR | Status: AC
  Filled 2021-04-04: qty 5

## 2021-04-04 MED ORDER — FENTANYL CITRATE (PF) 100 MCG/2ML IJ SOLN: INTRAMUSCULAR | Status: DC | PRN

## 2021-04-04 MED ORDER — FENTANYL CITRATE (PF) 100 MCG/2ML IJ SOLN
INTRAMUSCULAR | Status: AC
  Filled 2021-04-04: qty 2

## 2021-04-04 MED ORDER — ACETAMINOPHEN 500 MG PO TABS
ORAL_TABLET | ORAL | Status: AC
  Filled 2021-04-04: qty 2

## 2021-04-04 MED ORDER — SUGAMMADEX SODIUM 200 MG/2ML IV SOLN
INTRAVENOUS | Status: DC | PRN
  Administered 2021-04-04: 200 mg via INTRAVENOUS

## 2021-04-04 MED ORDER — BUPIVACAINE-EPINEPHRINE (PF) 0.25% -1:200000 IJ SOLN
INTRAMUSCULAR | Status: AC
  Filled 2021-04-04: qty 30

## 2021-04-04 MED ORDER — LIDOCAINE 2% (20 MG/ML) 5 ML SYRINGE
INTRAMUSCULAR | Status: DC | PRN
  Administered 2021-04-04: 60 mg via INTRAVENOUS

## 2021-04-04 MED ORDER — MIDAZOLAM HCL 2 MG/2ML IJ SOLN
INTRAMUSCULAR | Status: AC
  Filled 2021-04-04: qty 2

## 2021-04-04 MED ORDER — PROPOFOL 10 MG/ML IV BOLUS
INTRAVENOUS | Status: DC | PRN
  Administered 2021-04-04: 120 mg via INTRAVENOUS

## 2021-04-04 MED ORDER — ACETAMINOPHEN 10 MG/ML IV SOLN
1000.0000 mg | Freq: Once | INTRAVENOUS | Status: AC
  Administered 2021-04-04: 1000 mg via INTRAVENOUS

## 2021-04-04 MED ORDER — CEFAZOLIN SODIUM-DEXTROSE 2-4 GM/100ML-% IV SOLN
2.0000 g | INTRAVENOUS | Status: AC
  Administered 2021-04-04: 2 g via INTRAVENOUS

## 2021-04-04 MED ORDER — HYDROMORPHONE HCL 1 MG/ML IJ SOLN
0.2500 mg | INTRAMUSCULAR | Status: DC | PRN
  Administered 2021-04-04: 0.5 mg via INTRAVENOUS
  Administered 2021-04-04 (×2): 0.25 mg via INTRAVENOUS
  Administered 2021-04-04: 0.5 mg via INTRAVENOUS

## 2021-04-04 MED ORDER — ROCURONIUM BROMIDE 10 MG/ML (PF) SYRINGE
PREFILLED_SYRINGE | INTRAVENOUS | Status: AC
  Filled 2021-04-04: qty 10

## 2021-04-04 MED ORDER — PROMETHAZINE HCL 25 MG/ML IJ SOLN: 6.2500 mg | INTRAMUSCULAR | Status: DC | PRN

## 2021-04-04 MED ORDER — EPHEDRINE SULFATE 50 MG/ML IJ SOLN
INTRAMUSCULAR | Status: DC | PRN
  Administered 2021-04-04: 5 mg via INTRAVENOUS

## 2021-04-04 MED ORDER — ONDANSETRON HCL 4 MG/2ML IJ SOLN
INTRAMUSCULAR | Status: AC
  Filled 2021-04-04: qty 2

## 2021-04-04 MED ORDER — SODIUM CHLORIDE 0.9% FLUSH
INTRAVENOUS | Status: DC | PRN
  Administered 2021-04-04: 20 mL via INTRADERMAL

## 2021-04-04 MED ORDER — CEFAZOLIN SODIUM-DEXTROSE 2-4 GM/100ML-% IV SOLN
INTRAVENOUS | Status: AC
  Filled 2021-04-04: qty 100

## 2021-04-04 MED ORDER — DEXTROSE-NACL 5-0.9 % IV SOLN: INTRAVENOUS | Status: DC

## 2021-04-04 MED ORDER — ENSURE PRE-SURGERY PO LIQD: 296.0000 mL | Freq: Once | ORAL | Status: DC

## 2021-04-04 MED ORDER — ROCURONIUM BROMIDE 100 MG/10ML IV SOLN
INTRAVENOUS | Status: DC | PRN
  Administered 2021-04-04: 60 mg via INTRAVENOUS

## 2021-04-04 MED ORDER — FENTANYL CITRATE (PF) 100 MCG/2ML IJ SOLN
25.0000 ug | INTRAMUSCULAR | Status: DC | PRN
  Administered 2021-04-04 (×2): 50 ug via INTRAVENOUS
  Administered 2021-04-04 (×2): 25 ug via INTRAVENOUS

## 2021-04-04 MED ORDER — SCOPOLAMINE 1 MG/3DAYS TD PT72
1.0000 | MEDICATED_PATCH | TRANSDERMAL | Status: DC
  Administered 2021-04-04: 1.5 mg via TRANSDERMAL
  Filled 2021-04-04: qty 1

## 2021-04-04 MED ORDER — HYDROMORPHONE HCL 1 MG/ML IJ SOLN
1.0000 mg | INTRAMUSCULAR | Status: DC | PRN
  Administered 2021-04-04 – 2021-04-05 (×5): 1 mg via INTRAVENOUS
  Filled 2021-04-04 (×2): qty 1
  Filled 2021-04-04: qty 2
  Filled 2021-04-04 (×3): qty 1

## 2021-04-04 MED ORDER — MENTHOL 3 MG MT LOZG
1.0000 | LOZENGE | OROMUCOSAL | Status: DC | PRN
  Administered 2021-04-04: 3 mg via ORAL
  Filled 2021-04-04: qty 9

## 2021-04-04 MED ORDER — ACETAMINOPHEN 10 MG/ML IV SOLN
INTRAVENOUS | Status: AC
  Filled 2021-04-04: qty 100

## 2021-04-04 SURGICAL SUPPLY — 65 items
ADH SKN CLS APL DERMABOND .7 (GAUZE/BANDAGES/DRESSINGS) ×1
APL PRP STRL LF DISP 70% ISPRP (MISCELLANEOUS) ×1
APPLIER CLIP 5 13 M/L LIGAMAX5 (MISCELLANEOUS)
APR CLP MED LRG 5 ANG JAW (MISCELLANEOUS)
CANNULA REDUC XI 12-8 STAPL (CANNULA) ×2
CANNULA REDUC XI 12-8MM STAPL (CANNULA) ×1
CANNULA REDUCER 12-8 DVNC XI (CANNULA) ×1 IMPLANT
CHLORAPREP W/TINT 26 (MISCELLANEOUS) ×3 IMPLANT
CLIP APPLIE 5 13 M/L LIGAMAX5 (MISCELLANEOUS) IMPLANT
COVER MAYO STAND STRL (DRAPES) ×3 IMPLANT
COVER SURGICAL LIGHT HANDLE (MISCELLANEOUS) ×3 IMPLANT
COVER TIP SHEARS 8 DVNC (MISCELLANEOUS) IMPLANT
COVER TIP SHEARS 8MM DA VINCI (MISCELLANEOUS)
DECANTER SPIKE VIAL GLASS SM (MISCELLANEOUS) ×3 IMPLANT
DEFOGGER SCOPE WARMER CLEARIFY (MISCELLANEOUS) ×3 IMPLANT
DERMABOND ADVANCED (GAUZE/BANDAGES/DRESSINGS) ×2
DERMABOND ADVANCED .7 DNX12 (GAUZE/BANDAGES/DRESSINGS) ×1 IMPLANT
DEVICE TROCAR PUNCTURE CLOSURE (ENDOMECHANICALS) ×3 IMPLANT
DRAIN PENROSE 0.5X18 (DRAIN) IMPLANT
DRAPE ARM DVNC X/XI (DISPOSABLE) ×4 IMPLANT
DRAPE CARDIOVASC SPLIT 88X140 (DRAPES) ×3 IMPLANT
DRAPE COLUMN DVNC XI (DISPOSABLE) ×1 IMPLANT
DRAPE DA VINCI XI ARM (DISPOSABLE) ×12
DRAPE DA VINCI XI COLUMN (DISPOSABLE) ×3
DRAPE ORTHO SPLIT 77X108 STRL (DRAPES) ×3
DRAPE SURG ORHT 6 SPLT 77X108 (DRAPES) ×1 IMPLANT
ELECT REM PT RETURN 9FT ADLT (ELECTROSURGICAL) ×3
ELECTRODE REM PT RTRN 9FT ADLT (ELECTROSURGICAL) ×1 IMPLANT
GLOVE SURG ENC MOIS LTX SZ7.5 (GLOVE) ×6 IMPLANT
GOWN STRL REUS W/ TWL LRG LVL3 (GOWN DISPOSABLE) ×1 IMPLANT
GOWN STRL REUS W/ TWL XL LVL3 (GOWN DISPOSABLE) ×2 IMPLANT
GOWN STRL REUS W/TWL 2XL LVL3 (GOWN DISPOSABLE) ×3 IMPLANT
GOWN STRL REUS W/TWL LRG LVL3 (GOWN DISPOSABLE) ×6
GOWN STRL REUS W/TWL XL LVL3 (GOWN DISPOSABLE) ×6
KIT BASIN OR (CUSTOM PROCEDURE TRAY) ×3 IMPLANT
KIT TURNOVER KIT B (KITS) IMPLANT
MARKER SKIN DUAL TIP RULER LAB (MISCELLANEOUS) ×3 IMPLANT
MESH BIO-A 7X10 SYN MAT (Mesh General) ×3 IMPLANT
NDL INSUFFLATION 14GA 120MM (NEEDLE) ×1 IMPLANT
NEEDLE 22X1 1/2 (OR ONLY) (NEEDLE) ×3 IMPLANT
NEEDLE INSUFFLATION 14GA 120MM (NEEDLE) ×3 IMPLANT
OBTURATOR OPTICAL STANDARD 8MM (TROCAR)
OBTURATOR OPTICAL STND 8 DVNC (TROCAR)
OBTURATOR OPTICALSTD 8 DVNC (TROCAR) IMPLANT
PENCIL SMOKE EVACUATOR (MISCELLANEOUS) IMPLANT
SCISSORS LAP 5X35 DISP (ENDOMECHANICALS) IMPLANT
SEAL CANN UNIV 5-8 DVNC XI (MISCELLANEOUS) ×3 IMPLANT
SEAL XI 5MM-8MM UNIVERSAL (MISCELLANEOUS) ×9
SEALER VESSEL DA VINCI XI (MISCELLANEOUS) ×3
SEALER VESSEL EXT DVNC XI (MISCELLANEOUS) ×1 IMPLANT
SET IRRIG TUBING LAPAROSCOPIC (IRRIGATION / IRRIGATOR) ×3 IMPLANT
SET TUBE SMOKE EVAC HIGH FLOW (TUBING) ×3 IMPLANT
STAPLER CANNULA SEAL DVNC XI (STAPLE) ×1 IMPLANT
STAPLER CANNULA SEAL XI (STAPLE) ×3
STAPLER VISISTAT 35W (STAPLE) IMPLANT
STOPCOCK 4 WAY LG BORE MALE ST (IV SETS) ×3 IMPLANT
SUT ETHIBOND 0 36 GRN (SUTURE) ×6 IMPLANT
SUT ETHIBOND 2 0 SH (SUTURE)
SUT ETHIBOND 2 0 SH 36X2 (SUTURE) IMPLANT
SUT MNCRL AB 4-0 PS2 18 (SUTURE) ×3 IMPLANT
SUT SILK 0 SH 30 (SUTURE) ×3 IMPLANT
SYR 30ML SLIP (SYRINGE) ×3 IMPLANT
TRAY FOLEY MTR SLVR 16FR STAT (SET/KITS/TRAYS/PACK) ×1 IMPLANT
TRAY LAPAROSCOPIC MC (CUSTOM PROCEDURE TRAY) ×3 IMPLANT
TROCAR ADV FIXATION 5X100MM (TROCAR) ×3 IMPLANT

## 2021-04-04 NOTE — Op Note (Signed)
04/04/2021  9:17 AM  PATIENT:  Melissa Mcclure  65 y.o. female  PRE-OPERATIVE DIAGNOSIS:  Hiatal Hernia  POST-OPERATIVE DIAGNOSIS:  Hiatal Hernia  PROCEDURE:  Procedure(s): ROBOTIC HIATAL HERNIA REPAIR WITH TOUPET  FUNDOPLICATION (N/A)  SURGEON:  Surgeon(s) and Role:    * Ralene Ok, MD - Primary  ASSISTANTS: Pryor Curia, RNFA   ANESTHESIA:   local, regional, and general  EBL:  minimal   BLOOD ADMINISTERED:none  DRAINS: none   LOCAL MEDICATIONS USED:  BUPIVICAINE  and OTHER exparil  SPECIMEN:  No Specimen  DISPOSITION OF SPECIMEN:  N/A  COUNTS:  YES  TOURNIQUET:  * No tourniquets in log *  DICTATION: .Dragon Dictation   The patient was taken back to the operating room and placed in the supine position with bilateral SCDs in place. The patient was prepped and draped in the usual sterile fashion. After appropriate antibiotics were confirmed a timeout was called and all facts were verified.   A Veress needle technique was used to insufflate the abdomen to 15 mm of mercury the paramedian stab incision. Subsequent to this an 8 mm trocar was introduced as was a 8 millimeter camera. At this time the subsequent robotic trochars x3, were then placed adjacent to this trocar approximately 8-10 cm away.  A 26mm trocar was placed in the midclavicular line and a 0 vicryl was placed to help with closure at the end of the case. Each trocar was inserted under direct visualization, there were total of 4 trochars. The assistant trocar was then placed in the right lower quadrant under direct visualization. The Nathanson retractor was then visualized inserted into the abdomen and the incision just to the left of the falciform ligament. This was then placed to retract the liver appropriately. At this time the patient was positioned in reverse Trendelenburg.   At this time the robot patient cart was brought to the bedside and placed in good position and the arms were docked to the trochars  appropriately. At this time I proceeded to incised the gastrohepatic ligament.  At this time I proceeded to mobilize the stomach inferiorly and visualize the right crus. The peritoneum over the right crus was incised and right crus was identified. I proceeded to dissect this inferiorly until the left crus was seen joining the right crus. Once the right crus was adequately dissected we turned our to the left crus which was dissected away. This required traction of the stomach to the right side. Once this was visualized we then proceeded to circumferentially dissect the esophagus away from the surrounding tissue. The anterior and posterior vagus was seen along the esophagus at the GE junction.  These were both preserved throughout the entire case. At this time the phrenoesophageal fat pad was dissected away from the esophagus. There was a moderate-sized hiatal hernia seen. I mobilized the esophagus cephalad approximately 4-5 cm, clearing away the surrounding tissue. The anterior hernia sac was dissected away from the stomach and esophagus.  At this time we turned our attention to the greater curvature the stomach and the omentum was mobilized using the robotic vessel sealer. This was taken up to the greater curvature to the hiatus. This mobilized the entire greater curvature to allow mobilization and the wrap. I then proceeded to bring the greater curvature the stomach posterior to the esophagus, and a shoeshine technique was used to evaluate the mobilization of the greater curvature.   At this time I proceeded to close the hiatus using interrupted 0 Ethibonds  x 4. This brought together the hiatal closure without undue stricture to the esophagus.   A piece of Gore Bio A hiatal mesh was placed over the hiatal closure and sutured to the crus using 0 Ethibonds  x 4.  At this time the greater curvature was brought around the esophagus and sutured using 0 silk sutures interrupted fashion approximately 1 cm apart x3  on each side of the esophagus in a Toupet fashion. A left collar stitch was then used to gastropexy the stomach from the wrap to the diaphragm just lateral to the left crus as.  A second collar stitch was placed from the wrap to the right crus.  The wrap lay loose with no strangulation of the esophagus.  At this time the robot was undocked. The liver trocar was removed. At this time insufflation was evacuated. Skin was reapproximated for Monocryl subcuticular fashion. The skin was then dressed with Dermabond. The patient tolerated the procedure well and was taken to the recovery room in stable condition.   PLAN OF CARE: Admit for overnight observation  PATIENT DISPOSITION:  PACU - hemodynamically stable.   Delay start of Pharmacological VTE agent (>24hrs) due to surgical blood loss or risk of bleeding: not applicable

## 2021-04-04 NOTE — Transfer of Care (Signed)
Immediate Anesthesia Transfer of Care Note  Patient: Melissa Mcclure  Procedure(s) Performed: ROBOTIC HIATAL HERNIA REPAIR WITH FUNDOPLICATION (Abdomen)  Patient Location: PACU  Anesthesia Type:General  Level of Consciousness: drowsy, patient cooperative and responds to stimulation  Airway & Oxygen Therapy: Patient Spontanous Breathing  Post-op Assessment: Report given to RN, Post -op Vital signs reviewed and stable and Patient moving all extremities  Post vital signs: Reviewed and stable  Last Vitals:  Vitals Value Taken Time  BP 143/78 04/04/21 0934  Temp    Pulse 77 04/04/21 0937  Resp 17 04/04/21 0937  SpO2 96 % 04/04/21 0937  Vitals shown include unvalidated device data.  Last Pain:  Vitals:   04/04/21 0644  TempSrc:   PainSc: 1          Complications: No notable events documented.

## 2021-04-04 NOTE — Progress Notes (Signed)
Patient arrived to room 6N28 from PACU. Patient is alert and oriented times 4. Bed is in the lowest and locked position with bed rails up times 2. Belongings and call bell within reach. Patient's spouse at the bedside. 5 lap sites CDI.

## 2021-04-04 NOTE — Discharge Instructions (Signed)
EATING AFTER YOUR ESOPHAGEAL SURGERY (Stomach Fundoplication, Hiatal Hernia repair, Achalasia surgery, etc)  ######################################################################  EAT Start with a pureed / full liquid diet (see below) Gradually transition to a high fiber diet with a fiber supplement over the next month after discharge.    WALK Walk an hour a day.  Control your pain to do that.    CONTROL PAIN Control pain so that you can walk, sleep, tolerate sneezing/coughing, go up/down stairs.  HAVE A BOWEL MOVEMENT DAILY Keep your bowels regular to avoid problems.  OK to try a laxative to override constipation.  OK to use an antidairrheal to slow down diarrhea.  Call if not better after 2 tries  CALL IF YOU HAVE PROBLEMS/CONCERNS Call if you are still struggling despite following these instructions. Call if you have concerns not answered by these instructions  ######################################################################   After your esophageal surgery, expect some sticking with swallowing over the next 1-2 months.    If food sticks when you eat, it is called "dysphagia".  This is due to swelling around your esophagus at the wrap & hiatal diaphragm repair.  It will gradually ease off over the next few months.  To help you through this temporary phase, we start you out on a pureed (blenderized) diet.  Your first meal in the hospital was thin liquids.  You should have been given a pureed diet by the time you left the hospital.  We ask patients to stay on a pureed diet for the first 2-3 weeks to avoid anything getting "stuck" near your recent surgery.  Don't be alarmed if your ability to swallow doesn't progress according to this plan.  Everyone is different and some diets can advance more or less quickly.    It is often helpful to crush your medications or split them as they can sometimes stick, especially the first week or so.   Some BASIC RULES to follow  are:  Maintain an upright position whenever eating or drinking.  Take small bites - just a teaspoon size bite at a time.  Eat slowly.  It may also help to eat only one food at a time.  Consider nibbling through smaller, more frequent meals & avoid the urge to eat BIG meals  Do not push through feelings of fullness, nausea, or bloatedness  Do not mix solid foods and liquids in the same mouthful  Try not to "wash foods down" with large gulps of liquids.  Avoid carbonated (bubbly/fizzy) drinks.    Avoid foods that make you feel gassy or bloated.  Start with bland foods first.  Wait on trying greasy, fried, or spicy meals until you are tolerating more bland solids well.  Understand that it will be hard to burp and belch at first.  This gradually improves with time.  Expect to be more gassy/flatulent/bloated initially.  Walking will help your body manage it better.  Consider using medications for bloating that contain simethicone such as  Maalox or Gas-X   Consider crushing her medications, especially smaller pills.  The ability to swallow pills should get easier after a few weeks  Eat in a relaxed atmosphere & minimize distractions.  Avoid talking while eating.    Do not use straws.  Following each meal, sit in an upright position (90 degree angle) for 60 to 90 minutes.  Going for a short walk can help as well  If food does stick, don't panic.  Try to relax and let the food pass on its own.    Sipping WARM LIQUID such as strong hot black tea can also help slide it down.   Be gradual in changes & use common sense:  -If you easily tolerating a certain "level" of foods, advance to the next level gradually -If you are having trouble swallowing a particular food, then avoid it.   -If food is sticking when you advance your diet, go back to thinner previous diet (the lower LEVEL) for 1-2 days.  LEVEL 1 = PUREED DIET  Do for the first 2 WEEKS AFTER SURGERY  -Foods in this group are  pureed or blenderized to a smooth, mashed potato-like consistency.  -If necessary, the pureed foods can keep their shape with the addition of a thickening agent.   -Meat should be pureed to a smooth, pasty consistency.  Hot broth or gravy may be added to the pureed meat, approximately 1 oz. of liquid per 3 oz. serving of meat. -CAUTION:  If any foods do not puree into a smooth consistency, swallowing will be more difficult.  (For example, nuts or seeds sometimes do not blend well.)  Hot Foods Cold Foods  Pureed scrambled eggs and cheese Pureed cottage cheese  Baby cereals Thickened juices and nectars  Thinned cooked cereals (no lumps) Thickened milk or eggnog  Pureed French toast or pancakes Ensure  Mashed potatoes Ice cream  Pureed parsley, au gratin, scalloped potatoes, candied sweet potatoes Fruit or Italian ice, sherbet  Pureed buttered or alfredo noodles Plain yogurt  Pureed vegetables (no corn or peas) Instant breakfast  Pureed soups and creamed soups Smooth pudding, mousse, custard  Pureed scalloped apples Whipped gelatin  Gravies Sugar, syrup, honey, jelly  Sauces, cheese, tomato, barbecue, white, creamed Cream  Any baby food Creamer  Alcohol in moderation (not beer or champagne) Margarine  Coffee or tea Mayonnaise   Ketchup, mustard   Apple sauce   SAMPLE MENU:  PUREED DIET Breakfast Lunch Dinner   Orange juice, 1/2 cup  Cream of wheat, 1/2 cup  Pineapple juice, 1/2 cup  Pureed turkey, barley soup, 3/4 cup  Pureed Hawaiian chicken, 3 oz   Scrambled eggs, mashed or blended with cheese, 1/2 cup  Tea or coffee, 1 cup   Whole milk, 1 cup   Non-dairy creamer, 2 Tbsp.  Mashed potatoes, 1/2 cup  Pureed cooled broccoli, 1/2 cup  Apple sauce, 1/2 cup  Coffee or tea  Mashed potatoes, 1/2 cup  Pureed spinach, 1/2 cup  Frozen yogurt, 1/2 cup  Tea or coffee      LEVEL 2 = SOFT DIET  After your first 2 weeks, you can advance to a soft diet.   Keep on this  diet until everything goes down easily.  Hot Foods Cold Foods  White fish Cottage cheese  Stuffed fish Junior baby fruit  Baby food meals Semi thickened juices  Minced soft cooked, scrambled, poached eggs nectars  Souffle & omelets Ripe mashed bananas  Cooked cereals Canned fruit, pineapple sauce, milk  potatoes Milkshake  Buttered or Alfredo noodles Custard  Cooked cooled vegetable Puddings, including tapioca  Sherbet Yogurt  Vegetable soup or alphabet soup Fruit ice, Italian ice  Gravies Whipped gelatin  Sugar, syrup, honey, jelly Junior baby desserts  Sauces:  Cheese, creamed, barbecue, tomato, white Cream  Coffee or tea Margarine   SAMPLE MENU:  LEVEL 2 Breakfast Lunch Dinner   Orange juice, 1/2 cup  Oatmeal, 1/2 cup  Scrambled eggs with cheese, 1/2 cup  Decaffeinated tea, 1 cup  Whole milk, 1 cup    Non-dairy creamer, 2 Tbsp  Pineapple juice, 1/2 cup  Minced beef, 3 oz  Gravy, 2 Tbsp  Mashed potatoes, 1/2 cup  Minced fresh broccoli, 1/2 cup  Applesauce, 1/2 cup  Coffee, 1 cup  Turkey, barley soup, 3/4 cup  Minced Hawaiian chicken, 3 oz  Mashed potatoes, 1/2 cup  Cooked spinach, 1/2 cup  Frozen yogurt, 1/2 cup  Non-dairy creamer, 2 Tbsp      LEVEL 3 = CHOPPED DIET  -After all the foods in level 2 (soft diet) are passing through well you should advance up to more chopped foods.  -It is still important to cut these foods into small pieces and eat slowly.  Hot Foods Cold Foods  Poultry Cottage cheese  Chopped Swedish meatballs Yogurt  Meat salads (ground or flaked meat) Milk  Flaked fish (tuna) Milkshakes  Poached or scrambled eggs Soft, cold, dry cereal  Souffles and omelets Fruit juices or nectars  Cooked cereals Chopped canned fruit  Chopped French toast or pancakes Canned fruit cocktail  Noodles or pasta (no rice) Pudding, mousse, custard  Cooked vegetables (no frozen peas, corn, or mixed vegetables) Green salad  Canned small sweet peas  Ice cream  Creamed soup or vegetable soup Fruit ice, Italian ice  Pureed vegetable soup or alphabet soup Non-dairy creamer  Ground scalloped apples Margarine  Gravies Mayonnaise  Sauces:  Cheese, creamed, barbecue, tomato, white Ketchup  Coffee or tea Mustard   SAMPLE MENU:  LEVEL 3 Breakfast Lunch Dinner   Orange juice, 1/2 cup  Oatmeal, 1/2 cup  Scrambled eggs with cheese, 1/2 cup  Decaffeinated tea, 1 cup  Whole milk, 1 cup  Non-dairy creamer, 2 Tbsp  Ketchup, 1 Tbsp  Margarine, 1 tsp  Salt, 1/4 tsp  Sugar, 2 tsp  Pineapple juice, 1/2 cup  Ground beef, 3 oz  Gravy, 2 Tbsp  Mashed potatoes, 1/2 cup  Cooked spinach, 1/2 cup  Applesauce, 1/2 cup  Decaffeinated coffee  Whole milk  Non-dairy creamer, 2 Tbsp  Margarine, 1 tsp  Salt, 1/4 tsp  Pureed turkey, barley soup, 3/4 cup  Barbecue chicken, 3 oz  Mashed potatoes, 1/2 cup  Ground fresh broccoli, 1/2 cup  Frozen yogurt, 1/2 cup  Decaffeinated tea, 1 cup  Non-dairy creamer, 2 Tbsp  Margarine, 1 tsp  Salt, 1/4 tsp  Sugar, 1 tsp    LEVEL 4:  REGULAR FOODS  -Foods in this group are soft, moist, regularly textured foods.   -This level includes meat and breads, which tend to be the hardest things to swallow.   -Eat very slowly, chew well and continue to avoid carbonated drinks. -most people are at this level in 4-6 weeks  Hot Foods Cold Foods  Baked fish or skinned Soft cheeses - cottage cheese  Souffles and omelets Cream cheese  Eggs Yogurt  Stuffed shells Milk  Spaghetti with meat sauce Milkshakes  Cooked cereal Cold dry cereals (no nuts, dried fruit, coconut)  French toast or pancakes Crackers  Buttered toast Fruit juices or nectars  Noodles or pasta (no rice) Canned fruit  Potatoes (all types) Ripe bananas  Soft, cooked vegetables (no corn, lima, or baked beans) Peeled, ripe, fresh fruit  Creamed soups or vegetable soup Cakes (no nuts, dried fruit, coconut)  Canned chicken  noodle soup Plain doughnuts  Gravies Ice cream  Bacon dressing Pudding, mousse, custard  Sauces:  Cheese, creamed, barbecue, tomato, white Fruit ice, Italian ice, sherbet  Decaffeinated tea or coffee Whipped gelatin  Pork chops Regular gelatin     Canned fruited gelatin molds   Sugar, syrup, honey, jam, jelly   Cream   Non-dairy   Margarine   Oil   Mayonnaise   Ketchup   Mustard   TROUBLESHOOTING IRREGULAR BOWELS  1) Avoid extremes of bowel movements (no bad constipation/diarrhea)  2) Miralax 17gm mixed in 8oz. water or juice-daily. May use BID as needed.  3) Gas-x,Phazyme, etc. as needed for gas & bloating.  4) Soft,bland diet. No spicy,greasy,fried foods.  5) Prilosec over-the-counter as needed  6) May hold gluten/wheat products from diet to see if symptoms improve.  7) May try probiotics (Align, Activa, etc) to help calm the bowels down  7) If symptoms become worse call back immediately.    If you have any questions please call our office at [facility]: [phone].  

## 2021-04-04 NOTE — H&P (Signed)
History of Present Illness The patient is a 65 year old female who presents with a hiatal hernia. Patient is a 65 year old female, fold-back up today after having a CT scan and upper GI. CT scan did show a small 3 cm hiatal hernia.  Patient's upper GI.  The appropriate with her hiatal hernia and reflux.   Patient states that she continues with shortness of breath, dysphagia, and reflux. ---------------- Patient is a 65 year old female, who is referred by Dr. Mariea Clonts for an evaluation of a hiatal hernia. Patient has a long-standing history of hiatal hernia, reflux, on chronic omeprazole. Patient states that she is recently become more short of breath.  She feels that she's had an increase in the size of her hiatal hernia which is likely causing her shortness of breath. Patient does have to raise her head of bed had nighttime to prevent reflux.  Patient does have water brash, choking spells at nighttime.   She has a previous C-section 2 in the past and a right inguinal hernia repair.   She does not have a recent CT scan her upper GI.     Allergies Sulfur   Allergies Reconciled     Medication History Calcium  (500MG  Tablet, Oral) Active. Fiber Complete  (Oral) Active. Advil  (200MG  Tablet, Oral) Active. Amitriptyline HCl  (50MG  Tablet, Oral) Active. Estradiol  (0.05MG /24HR Patch TW, Transdermal) Active. Fiber Adult Gummies  (2GM Tablet Chewable, Oral) Active. Losartan Potassium-HCTZ  (100-12.5MG  Tablet, Oral) Active. Meloxicam  (15MG  Tablet, Oral) Active. Multiple Vitamin  (1 (one) Oral) Active. TiZANidine HCl  (2MG  Capsule, Oral) Active. Vitamin C  (1000MG  Tablet, Oral) Active. ZyrTEC Allergy  (10MG  Tablet, Oral) Active. Pepcid AC  (10MG  Tablet, Oral) Active. Protonix  (40MG  Tablet DR, Oral) Active. Medications Reconciled        Review of Systems General Not Present- Appetite Loss, Chills, Fatigue, Fever, Night Sweats, Weight Gain and Weight Loss. Skin Not Present- Change in  Wart/Mole, Dryness, Hives, Jaundice, New Lesions, Non-Healing Wounds, Rash and Ulcer. HEENT Present- Wears glasses/contact lenses. Not Present- Earache, Hearing Loss, Hoarseness, Nose Bleed, Oral Ulcers, Ringing in the Ears, Seasonal Allergies, Sinus Pain, Sore Throat, Visual Disturbances and Yellow Eyes. Breast Not Present- Breast Mass, Breast Pain, Nipple Discharge and Skin Changes. Cardiovascular Not Present- Chest Pain, Difficulty Breathing Lying Down, Leg Cramps, Palpitations, Rapid Heart Rate, Shortness of Breath and Swelling of Extremities. Gastrointestinal Not Present- Abdominal Pain, Bloating, Bloody Stool, Change in Bowel Habits, Chronic diarrhea, Constipation, Difficulty Swallowing, Excessive gas, Gets full quickly at meals, Hemorrhoids, Indigestion, Nausea, Rectal Pain and Vomiting. Musculoskeletal Not Present- Back Pain, Joint Pain, Joint Stiffness, Muscle Pain, Muscle Weakness and Swelling of Extremities. Neurological Not Present- Decreased Memory, Fainting, Headaches, Numbness, Seizures, Tingling, Tremor, Trouble walking and Weakness. Psychiatric Not Present- Anxiety, Bipolar, Change in Sleep Pattern, Depression, Fearful and Frequent crying. Endocrine Not Present- Cold Intolerance, Excessive Hunger, Hair Changes, Heat Intolerance and New Diabetes. Hematology Not Present- Blood Thinners, Easy Bruising, Excessive bleeding, Gland problems, HIV and Persistent Infections. All other systems negative     Physical Exam The physical exam findings are as follows: Note:  Constitutional: No acute distress, conversant, appears stated age   Eyes: Anicteric sclerae, moist conjunctiva, no lid lag   Neck: No thyromegaly, trachea midline, no cervical lymphadenopathy   Lungs: Clear to auscultation biilaterally, normal respiratory effot   Cardiovascular: regular rate & rhythm, no murmurs, no peripheal edema, pedal pulses 2+   GI: Soft, no masses or hepatosplenomegaly, non-tender to palpation  MSK: Normal gait, no clubbing cyanosis, edema   Skin: No rashes, palpation reveals normal skin turgor   Psychiatric: Appropriate judgment and insight, oriented to person, place, and time       Assessment & Plan HIATAL HERNIA (K44.9) Impression: 65 year old female with a moderate size hiatal hernia, chronic reflux 1. Will proceed to the operating room for a robotic hiatal hernia repair with mesh and Toupet fundoplication   2. Discussed with patient the risks and benefits of the procedure to include but not limited to: Infection, bleeding, damage to structures, possible pneumothorax, possible recurrence. The patient voiced understanding and wishes to proceed.

## 2021-04-04 NOTE — Anesthesia Procedure Notes (Addendum)
Procedure Name: Intubation Date/Time: 04/04/2021 7:38 AM Performed by: Myna Bright, CRNA Pre-anesthesia Checklist: Patient identified, Emergency Drugs available, Suction available and Patient being monitored Patient Re-evaluated:Patient Re-evaluated prior to induction Oxygen Delivery Method: Circle System Utilized Preoxygenation: Pre-oxygenation with 100% oxygen Induction Type: IV induction Ventilation: Mask ventilation without difficulty Laryngoscope Size: Miller and 2 Grade View: Grade I Tube type: Oral Tube size: 7.0 mm Number of attempts: 1 Airway Equipment and Method: Stylet and Oral airway Placement Confirmation: ETT inserted through vocal cords under direct vision, positive ETCO2 and breath sounds checked- equal and bilateral Tube secured with: Tape Dental Injury: Teeth and Oropharynx as per pre-operative assessment

## 2021-04-04 NOTE — Progress Notes (Signed)
Patient is able to ambulate around half the unit with minimal assist. She did require a walker for steadying, otherwise, tolerated the walk. She did have some pain upon returning to the bed but with rest it is subsiding. Required PRN pain medication twice since coming to [facility]. Explained that it is better to stay on top of pain whenever possible and to please let us know when the pain is getting uncomfortable. Looking forward to talking with dietician tomorrow.

## 2021-04-05 ENCOUNTER — Encounter (HOSPITAL_COMMUNITY): Payer: Self-pay | Admitting: General Surgery

## 2021-04-05 ENCOUNTER — Observation Stay (HOSPITAL_COMMUNITY): Payer: BC Managed Care – PPO

## 2021-04-05 DIAGNOSIS — K449 Diaphragmatic hernia without obstruction or gangrene: Secondary | ICD-10-CM | POA: Diagnosis not present

## 2021-04-05 DIAGNOSIS — Z01818 Encounter for other preprocedural examination: Secondary | ICD-10-CM | POA: Diagnosis not present

## 2021-04-05 LAB — BASIC METABOLIC PANEL
Anion gap: 8 (ref 5–15)
BUN: 12 mg/dL (ref 8–23)
CO2: 24 mmol/L (ref 22–32)
Calcium: 8.9 mg/dL (ref 8.9–10.3)
Chloride: 106 mmol/L (ref 98–111)
Creatinine, Ser: 0.67 mg/dL (ref 0.44–1.00)
GFR, Estimated: >60 mL/min (ref >60)
Glucose, Bld: 122 mg/dL — ABNORMAL HIGH (ref 70–99)
Potassium: 3.8 mmol/L (ref 3.5–5.1)
Sodium: 138 mmol/L (ref 135–145)

## 2021-04-05 MED ORDER — BOOST / RESOURCE BREEZE PO LIQD CUSTOM
1.0000 | Freq: Three times a day (TID) | ORAL | Status: DC
  Administered 2021-04-05: 1 via ORAL

## 2021-04-05 MED ORDER — HYDROCODONE-ACETAMINOPHEN 7.5-325 MG/15ML PO SOLN
10.0000 mL | ORAL | Status: DC | PRN
  Administered 2021-04-05 – 2021-04-06 (×4): 10 mL via ORAL
  Filled 2021-04-05 (×4): qty 15

## 2021-04-05 MED ORDER — IOHEXOL 300 MG/ML  SOLN
100.0000 mL | Freq: Once | INTRAMUSCULAR | Status: AC | PRN
  Administered 2021-04-05: 100 mL via ORAL

## 2021-04-05 NOTE — Progress Notes (Signed)
1 Day Post-Op   Subjective/Chief Complaint: Pt doing well Pain controlled with pain meds   Objective: Vital signs in last 24 hours: Temp:  [97.5 F (36.4 C)-99.5 F (37.5 C)] 99.5 F (37.5 C) (07/13 0906) Pulse Rate:  [64-77] 68 (07/13 0906) Resp:  [16-18] 18 (07/13 0906) BP: (106-153)/(63-89) 128/72 (07/13 0906) SpO2:  [93 %-100 %] 99 % (07/13 0906)    Intake/Output from previous day: 07/12 0701 - 07/13 0700 In: 2304.2 [I.V.:2304.2] Out: 30 [Blood:30] Intake/Output this shift: No intake/output data recorded.  General appearance: alert and cooperative GI: soft, non-tender; bowel sounds normal; no masses,  no organomegaly and inc c/d/i  Lab Results:  No results for input(s): WBC, HGB, HCT, PLT in the last 72 hours. BMET Recent Labs    04/05/21 0237  NA 138  K 3.8  CL 106  CO2 24  GLUCOSE 122*  BUN 12  CREATININE 0.67  CALCIUM 8.9   PT/INR No results for input(s): LABPROT, INR in the last 72 hours. ABG No results for input(s): PHART, HCO3 in the last 72 hours.  Invalid input(s): PCO2, PO2  Studies/Results: DG Chest 1 View  Result Date: 04/04/2021 CLINICAL DATA:  65 year old female status post gastric hernia repair, fundoplication. Postoperative day zero. EXAM: CHEST  1 VIEW COMPARISON:  CT Chest, Abdomen, and Pelvis 01/05/2021 and earlier. FINDINGS: Portable AP semi upright view at 1052 hours. Improved mediastinal contour on the left compatible with interval reduction of hiatal hernia. Patchy left lung base opacity. Mediastinal contours elsewhere stable. No pneumothorax. No pleural effusion is evident. Right lung appears clear allowing for portable technique. Chronic scoliosis. Postoperative changes to the right shoulder and lumbar spine. Visible bowel-gas pattern within normal limits. Small volume supraclavicular gas on the left is likely postoperative, and a small volume of left abdominal wall gas is also visible inferiorly. IMPRESSION: 1. Postoperative changes  to the left chest and abdomen with patchy left lung base opacity, likely atelectasis following reduction of the large hiatal hernia. 2. Some postoperative subcutaneous emphysema is visible. Electronically Signed   By: Genevie Ann M.D.   On: 04/04/2021 11:25   DG ESOPHAGUS W SINGLE CM (SOL OR THIN BA)  Result Date: 04/05/2021 CLINICAL DATA:  Postop periesophageal hernia repair. EXAM: ESOPHOGRAM/BARIUM SWALLOW TECHNIQUE: Single contrast examination was performed using  water-soluble. FLUOROSCOPY TIME:  Fluoroscopy Time:  2 minutes 24 seconds Radiation Exposure Index (if provided by the fluoroscopic device): Number of Acquired Spot Images: 6 COMPARISON:  Chest radiograph 04/04/2021. FINDINGS: There is a thin curvilinear density extending superiorly from the fundoplication repair, seen on all images. The linear density does not become fuller, however, with subsequent imaging. There is stasis of contrast in the esophagus. Relatively thin column of contrast traverses the fundoplication repair, likely due in part to postoperative edema. IMPRESSION: Possible small leak status post fundoplication. Correlation with surgical repair is recommended as a suture line could have a similar appearance. Electronically Signed   By: Lorin Picket M.D.   On: 04/05/2021 09:47    Anti-infectives: Anti-infectives (From admission, onward)    Start     Dose/Rate Route Frequency Ordered Stop   04/04/21 0633  ceFAZolin (ANCEF) 2-4 GM/100ML-% IVPB       Note to Pharmacy: Nikki Dom   : cabinet override      04/04/21 0633 04/04/21 0756   04/04/21 0630  ceFAZolin (ANCEF) IVPB 2g/100 mL premix        2 g 200 mL/hr over 30 Minutes Intravenous On call to  O.R. 04/04/21 4801 04/04/21 0739       Assessment/Plan: s/p Procedure(s): ROBOTIC HIATAL HERNIA REPAIR WITH FUNDOPLICATION (N/A) Advance diet to clears as Esophagram was neg for leak Mobilize Home later today or tomorrow if doing well.    LOS: 1 day    Ralene Ok 04/05/2021

## 2021-04-05 NOTE — Anesthesia Postprocedure Evaluation (Signed)
Anesthesia Post Note  Patient: Melissa Mcclure  Procedure(s) Performed: ROBOTIC HIATAL HERNIA REPAIR WITH FUNDOPLICATION (Abdomen)     Patient location during evaluation: PACU Anesthesia Type: General Level of consciousness: awake and alert Pain management: pain level controlled Vital Signs Assessment: post-procedure vital signs reviewed and stable Respiratory status: spontaneous breathing, nonlabored ventilation, respiratory function stable and patient connected to nasal cannula oxygen Cardiovascular status: blood pressure returned to baseline and stable Postop Assessment: no apparent nausea or vomiting Anesthetic complications: no   No notable events documented.  Last Vitals:  Vitals:   04/05/21 0906 04/05/21 1246  BP: 128/72 128/65  Pulse: 68 66  Resp: 18 18  Temp: 37.5 C 36.9 C  SpO2: 99% 95%    Last Pain:  Vitals:   04/05/21 1537  TempSrc:   PainSc: 2                  Catalina Gravel

## 2021-04-05 NOTE — Plan of Care (Signed)
Nutrition Education Note  RD consulted for nutrition education regarding diet instructions s/p Nissen (pureed diet for 2 weeks post-op).    RD provided "IDDSI Level 4 Pureed Nyoka Cowden) Nutrition Therapy" handout from the Academy of [employer]. Reviewed patient's dietary recall. Provided examples on ways to modify foods on pureed diet. Encouraged pt to blend foods using a blender or food processor and strain to obtain desired consistency (no lumps).   Also encouraged use of commercial pureed foods and oral nutritional supplements such as Ensure to provide adequate calories and protein. Discussed how to modify recipes to add calories and protein.    RD discussed why it is important for patient to adhere to diet recommendations and discussed possibility of diet advancement upon MD follow-up. Teach back method used.   Expect good compliance.   Body mass index is 26.25 kg/m. Pt meets criteria for Overweight based on current BMI.   Current diet order is clear liquids. Labs and medications reviewed.   No further nutrition interventions warranted at this time. RD contact information provided. If additional nutrition issues arise, please re-consult RD.    Derrel Nip, RD, LDN (she/her/hers) Registered Dietitian I After-Hours/Weekend Pager # in [place]

## 2021-04-06 DIAGNOSIS — K449 Diaphragmatic hernia without obstruction or gangrene: Secondary | ICD-10-CM | POA: Diagnosis not present

## 2021-04-06 MED ORDER — HYDROCODONE-ACETAMINOPHEN 7.5-325 MG/15ML PO SOLN: 10.0000 mL | ORAL | 0 refills | Status: DC | PRN

## 2021-04-06 NOTE — Discharge Summary (Signed)
Physician Discharge Summary  Patient ID: Melissa Mcclure MRN: [id number] DOB/AGE: December 09, 1955 65 y.o.  Admit date: 04/04/2021 Discharge date: 04/06/2021  Admission Diagnoses: s/p hhr and toupet fundo  Discharge Diagnoses:  Active Problems:   S/P repair of paraesophageal hernia   Discharged Condition: good  Hospital Course: pt admitted post op.  Please see op note for ufll details. Pt underwent DG esop pod 1 and showed no leak.  Pt was started on a cLD and tol that well.  She was amb well on her own.  She was deemed stable for DC and Dc'd home  Consults:  none  Significant Diagnostic Studies: DG esoph- no leak  Treatments: surgery: as above  Discharge Exam: Blood pressure (!) 152/90, pulse 77, temperature 98.4 F (36.9 C), temperature source Oral, resp. rate 16, height 5\' 2"  (1.575 m), weight 65.1 kg, last menstrual period 06/24/2008, SpO2 96 %. General appearance: alert and cooperative GI: soft, non-tender; bowel sounds normal; no masses,  no organomegaly  Disposition: Discharge disposition: 01-Home or Self Care       Discharge Instructions     Increase activity slowly   Complete by: As directed       Allergies as of 04/06/2021       Reactions   Elemental Sulfur Hives        Medication List     TAKE these medications    acetaminophen 500 MG tablet Commonly known as: TYLENOL Take 1,000 mg by mouth 2 (two) times daily as needed for mild pain.   Align 4 MG Caps Take 4 mg by mouth at bedtime.   Alive Womens 50+ Tabs Take 1 tablet by mouth daily.   cetirizine 10 MG tablet Commonly known as: ZYRTEC Take 10 mg by mouth daily.   diclofenac 75 MG EC tablet Commonly known as: VOLTAREN Take 75 mg by mouth 2 (two) times daily as needed for mild pain.   HYDROcodone-acetaminophen 7.5-325 mg/15 ml solution Commonly known as: HYCET Take 10 mLs by mouth every 4 (four) hours as needed for moderate pain.   ibuprofen 200 MG tablet Commonly known as: ADVIL Take  400 mg by mouth every 6 (six) hours as needed for moderate pain.   meclizine 25 MG tablet Commonly known as: ANTIVERT Take 1 tablet (25 mg total) by mouth 3 (three) times daily as needed (vertigo). Reported on 01/23/2016   ondansetron 8 MG disintegrating tablet Commonly known as: ZOFRAN-ODT Take 8 mg by mouth every 8 (eight) hours as needed for nausea or vomiting.   ondansetron 4 MG disintegrating tablet Commonly known as: Zofran ODT 4mg  ODT q4 hours prn nausea/vomit   pantoprazole 40 MG tablet Commonly known as: PROTONIX Take 40 mg by mouth daily before breakfast.   polyethylene glycol 17 g packet Commonly known as: MIRALAX / GLYCOLAX Take 17 g by mouth daily as needed for moderate constipation.   traMADol 50 MG tablet Commonly known as: ULTRAM Take 50 mg by mouth every 6 (six) hours as needed (for pain).   VIACTIV CALCIUM PLUS D PO Take 1 tablet by mouth daily.   vitamin C with rose hips 1000 MG tablet Take 1,000 mg by mouth daily.       ASK your doctor about these medications    losartan-hydrochlorothiazide 100-12.5 MG tablet Commonly known as: HYZAAR Take 1 tablet by mouth daily. Reported on 01/23/2016   raloxifene 60 MG tablet Commonly known as: EVISTA TAKE 1 TABLET BY MOUTH EVERY DAY  Follow-up Information     Ralene Ok, MD. Schedule an appointment as soon as possible for a visit in 2 week(s).   Specialty: General Surgery Why: Post op visit Contact information: [address] [place] [address] [phone]                 Signed: Ralene Ok 04/06/2021, 7:16 AM

## 2021-05-05 DIAGNOSIS — M25552 Pain in left hip: Secondary | ICD-10-CM | POA: Diagnosis not present

## 2021-05-10 DIAGNOSIS — M2569 Stiffness of other specified joint, not elsewhere classified: Secondary | ICD-10-CM | POA: Diagnosis not present

## 2021-05-10 DIAGNOSIS — R262 Difficulty in walking, not elsewhere classified: Secondary | ICD-10-CM | POA: Diagnosis not present

## 2021-05-10 DIAGNOSIS — M25652 Stiffness of left hip, not elsewhere classified: Secondary | ICD-10-CM | POA: Diagnosis not present

## 2021-05-10 DIAGNOSIS — M6281 Muscle weakness (generalized): Secondary | ICD-10-CM | POA: Diagnosis not present

## 2021-05-15 DIAGNOSIS — M6281 Muscle weakness (generalized): Secondary | ICD-10-CM | POA: Diagnosis not present

## 2021-05-15 DIAGNOSIS — M25652 Stiffness of left hip, not elsewhere classified: Secondary | ICD-10-CM | POA: Diagnosis not present

## 2021-05-15 DIAGNOSIS — R262 Difficulty in walking, not elsewhere classified: Secondary | ICD-10-CM | POA: Diagnosis not present

## 2021-05-15 DIAGNOSIS — M2569 Stiffness of other specified joint, not elsewhere classified: Secondary | ICD-10-CM | POA: Diagnosis not present

## 2021-05-17 DIAGNOSIS — M6281 Muscle weakness (generalized): Secondary | ICD-10-CM | POA: Diagnosis not present

## 2021-05-17 DIAGNOSIS — M2569 Stiffness of other specified joint, not elsewhere classified: Secondary | ICD-10-CM | POA: Diagnosis not present

## 2021-05-17 DIAGNOSIS — M25652 Stiffness of left hip, not elsewhere classified: Secondary | ICD-10-CM | POA: Diagnosis not present

## 2021-05-17 DIAGNOSIS — R262 Difficulty in walking, not elsewhere classified: Secondary | ICD-10-CM | POA: Diagnosis not present

## 2021-05-19 ENCOUNTER — Ambulatory Visit: Payer: BC Managed Care – PPO | Admitting: Hematology

## 2021-05-19 ENCOUNTER — Other Ambulatory Visit: Payer: BC Managed Care – PPO

## 2021-05-23 DIAGNOSIS — R262 Difficulty in walking, not elsewhere classified: Secondary | ICD-10-CM | POA: Diagnosis not present

## 2021-05-23 DIAGNOSIS — M25652 Stiffness of left hip, not elsewhere classified: Secondary | ICD-10-CM | POA: Diagnosis not present

## 2021-05-23 DIAGNOSIS — M2569 Stiffness of other specified joint, not elsewhere classified: Secondary | ICD-10-CM | POA: Diagnosis not present

## 2021-05-23 DIAGNOSIS — M6281 Muscle weakness (generalized): Secondary | ICD-10-CM | POA: Diagnosis not present

## 2021-05-25 ENCOUNTER — Encounter: Payer: Self-pay | Admitting: Hematology

## 2021-05-25 ENCOUNTER — Inpatient Hospital Stay (HOSPITAL_BASED_OUTPATIENT_CLINIC_OR_DEPARTMENT_OTHER): Payer: BC Managed Care – PPO | Admitting: Hematology

## 2021-05-25 ENCOUNTER — Other Ambulatory Visit: Payer: Self-pay

## 2021-05-25 ENCOUNTER — Inpatient Hospital Stay: Payer: BC Managed Care – PPO | Attending: Hematology

## 2021-05-25 VITALS — BP 120/83 | HR 68 | Temp 98.3°F | Resp 17 | Ht 62.0 in | Wt 137.8 lb

## 2021-05-25 DIAGNOSIS — Z85820 Personal history of malignant melanoma of skin: Secondary | ICD-10-CM | POA: Insufficient documentation

## 2021-05-25 DIAGNOSIS — D0511 Intraductal carcinoma in situ of right breast: Secondary | ICD-10-CM | POA: Diagnosis not present

## 2021-05-25 DIAGNOSIS — Z79811 Long term (current) use of aromatase inhibitors: Secondary | ICD-10-CM | POA: Diagnosis not present

## 2021-05-25 DIAGNOSIS — I10 Essential (primary) hypertension: Secondary | ICD-10-CM | POA: Insufficient documentation

## 2021-05-25 DIAGNOSIS — R21 Rash and other nonspecific skin eruption: Secondary | ICD-10-CM | POA: Insufficient documentation

## 2021-05-25 DIAGNOSIS — Z923 Personal history of irradiation: Secondary | ICD-10-CM | POA: Insufficient documentation

## 2021-05-25 DIAGNOSIS — Z79899 Other long term (current) drug therapy: Secondary | ICD-10-CM | POA: Diagnosis not present

## 2021-05-25 DIAGNOSIS — M19042 Primary osteoarthritis, left hand: Secondary | ICD-10-CM | POA: Diagnosis not present

## 2021-05-25 DIAGNOSIS — Z170 Estrogen receptor positive status [ER+]: Secondary | ICD-10-CM | POA: Diagnosis not present

## 2021-05-25 DIAGNOSIS — R232 Flushing: Secondary | ICD-10-CM | POA: Diagnosis not present

## 2021-05-25 LAB — CBC WITH DIFFERENTIAL (CANCER CENTER ONLY)
Abs Immature Granulocytes: 0.01 10*3/uL (ref 0.00–0.07)
Basophils Absolute: 0 10*3/uL (ref 0.0–0.1)
Basophils Relative: 1 %
Eosinophils Absolute: 0.1 10*3/uL (ref 0.0–0.5)
Eosinophils Relative: 3 %
HCT: 37.9 % (ref 36.0–46.0)
Hemoglobin: 12.8 g/dL (ref 12.0–15.0)
Immature Granulocytes: 0 %
Lymphocytes Relative: 33 %
Lymphs Abs: 1.5 10*3/uL (ref 0.7–4.0)
MCH: 31.5 pg (ref 26.0–34.0)
MCHC: 33.8 g/dL (ref 30.0–36.0)
MCV: 93.3 fL (ref 80.0–100.0)
Monocytes Absolute: 0.5 10*3/uL (ref 0.1–1.0)
Monocytes Relative: 11 %
Neutro Abs: 2.5 10*3/uL (ref 1.7–7.7)
Neutrophils Relative %: 52 %
Platelet Count: 233 10*3/uL (ref 150–400)
RBC: 4.06 MIL/uL (ref 3.87–5.11)
RDW: 12.2 % (ref 11.5–15.5)
WBC Count: 4.7 10*3/uL (ref 4.0–10.5)
nRBC: 0 % (ref 0.0–0.2)

## 2021-05-25 LAB — CMP (CANCER CENTER ONLY)
ALT: 21 U/L (ref 0–44)
AST: 23 U/L (ref 15–41)
Albumin: 4.1 g/dL (ref 3.5–5.0)
Alkaline Phosphatase: 57 U/L (ref 38–126)
Anion gap: 10 (ref 5–15)
BUN: 18 mg/dL (ref 8–23)
CO2: 28 mmol/L (ref 22–32)
Calcium: 9.6 mg/dL (ref 8.9–10.3)
Chloride: 105 mmol/L (ref 98–111)
Creatinine: 0.9 mg/dL (ref 0.44–1.00)
GFR, Estimated: >60 mL/min (ref >60)
Glucose, Bld: 58 mg/dL — ABNORMAL LOW (ref 70–99)
Potassium: 3.9 mmol/L (ref 3.5–5.1)
Sodium: 143 mmol/L (ref 135–145)
Total Bilirubin: 0.4 mg/dL (ref 0.3–1.2)
Total Protein: 6.8 g/dL (ref 6.5–8.1)

## 2021-05-25 NOTE — Progress Notes (Signed)
[place]   [phone] [phone]   Clinic Follow up Note   Patient Care Team: Libby Maw, MD as PCP - General (Family Medicine)  Date of Service:  05/25/2021  CHIEF COMPLAINT: f/u of right breast DCIS  CURRENT THERAPY:  Raloxifene 103m once daily starting in 02/2018. Held For 2 months in 2021 due to hot flashes. Restarted due to no difference.  ASSESSMENT & PLAN:  Melissa PRESTONis a 65y.o. female with   1. Ductal carcinoma in situ (DCIS) of right breast, ER/PR Positive, Grade 1 -She was diagnosed in 10/2017. She is s/p right breast lumpectomy and adjuvant radiation.  -She started anti-estrogen therapy with Raloxifene in 02/2018. She held it for 2 months in 2021 due to hot flashes but restarted due to no difference. -her most recent mammogram from 12/13/20 was benign. -From a abreast cancer standpoint, She is clinically doing well. Her physical exam was unremarkable. There is no clinical concern for recurrence. -Next Mammogram in 11/2021.  -Continue Raloxifene  -F/u in 6 months.    2. Dyspnea on exertion, Hiatal Hernia  -She underwent echo on 12/15/20 for enlarged heart. Results were normal. -She underwent hiatal hernia repair surgery on 04/04/21 under Dr. RRosendo Gros This improved her dyspnea.   3. Hot Flashes  -She knows to avoid hormonal replacement. Gabapentin did not work well enough for her. This is worse in the day and does not significantly effect her sleep.  -holding Raloxifene did not help.  -Improved with acupuncture, once a month   4. Genetic Counseling was negative for pathogenic mutation.    5. HTN, history of skin melanoma, Arthritis  -Follow-up with PCP -She has chronic diffuse skin rash of her LE. She will continue f/u with her Dermatologist.  -OA Mainly in hands, back and neck    6. Bone health  -[facility] lowest T-Score of -0.4 at left femur neck. Her 12/10/19 was stable (T-score -0.5).   -She is on Raloxifene which strengthens her bone.     PLAN:  -continue raloxifene  -[facility] -Lab and f/u in one year    No problem-specific Assessment & Plan notes found for this encounter.   SUMMARY OF ONCOLOGIC HISTORY: Oncology History Overview Note  Cancer Staging Ductal carcinoma in situ (DCIS) of right breast Staging form: Breast, AJCC 8th Edition - Clinical stage from 11/19/2017: Stage 0 (cTis (DCIS), cN0, cM0, ER: Positive, PR: Positive, HER2: Not assessed ) - Signed by FTruitt Merle MD on 11/26/2017     Ductal carcinoma in situ (DCIS) of right breast  11/07/2017 Mammogram    Diagnostic Mammogram Right breast 11/07/17 IMPRESSION:  The results of the exam were reviewed with the patient.  The 3 cm are of grouped branching linear calcifications in the right breast lower inner quadrant middle depth are at moderate concern but not classic for malignancy.  A Stereotactic biopsy is recommended.   11/19/2017 Initial Biopsy   Diagnosis 11/19/17 Breast, right, needle core biopsy - DUCTAL CARCINOMA IN SITU WITH CALCIFICATIONS. - SEE COMMENT.    11/19/2017 Receptors her2   Estrogen Receptor: 90%, POSITIVE, STRONG STAINING INTENSITY Progesterone Receptor: 25%, POSITIVE, STRONG STAINING INTENSITY    11/19/2017 Initial Diagnosis   Ductal carcinoma in situ (DCIS) of right breast   12/23/2017 Genetic Testing   Testing did not reveal a pathogenic mutation in any of the genes analyzed. A copy of the genetic test report will be scanned  into Epic under the Media tab.   The genes analyzed were the 83 genes on [facility]'s Multi-Cancer panel (ALK, APC, ATM, AXIN2, BAP1, BARD1, BLM, BMPR1A, BRCA1, BRCA2, BRIP1, CASR, CDC73, CDH1, CDK4, CDKN1B, CDKN1C, CDKN2A, CEBPA, CHEK2, CTNNA1, DICER1, DIS3L2, EGFR, EPCAM, FH, FLCN, GATA2, GPC3, GREM1, HOXB13, HRAS, KIT, MAX, MEN1, MET, MITF, MLH1, MSH2, MSH3, MSH6, MUTYH, NBN, NF1, NF2, NTHL1, PALB2, PDGFRA, PHOX2B, PMS2, POLD1, POLE, POT1,  PRKAR1A, PTCH1, PTEN, RAD50, RAD51C, RAD51D, RB1, RECQL4, RET, RUNX1, SDHA, SDHAF2, SDHB, SDHC, SDHD, SMAD4, SMARCA4, SMARCB1, SMARCE1, STK11, SUFU, TERC, TERT, TMEM127, TP53, TSC1, TSC2, VHL, WRN, WT1).   12/25/2017 Pathology Results   Diagnosis, 12/25/2017 Breast, lumpectomy, Right w/seed - DUCTAL CARCINOMA IN SITU WITH CALCIFICATIONS, LOW GRADE, SPANNING 1.3 CM. - THE SURGICAL RESECTION MARGINS ARE NEGATIVE FOR CARCINOMA   01/20/2018 - 02/14/2018 Radiation Therapy   Radiation treatment dates:   01/20/2018 - 02/14/2018   Site/dose:   The patient initially received a dose of 42.5 Gy in 17 fractions to the right breast using whole-breast tangent fields. This was delivered using a 3-D conformal technique. The patient then received a boost to the seroma. This delivered an additional 7.5 Gy in 3 fractions using a 3 field photon technique due to the depth of the seroma. The total dose was 50 Gy.   Narrative: The patient tolerated radiation treatment relatively well.   The patient had some expected skin irritation as she progressed during treatment. Moist desquamation was not present at the end of treatment.    02/2018 -  Anti-estrogen oral therapy   Raloxifene 48m once daily starting in 02/2018      INTERVAL HISTORY:  Melissa Mcclure here for a follow up of DCIS. She was last seen by me on 11/16/20. She presents to the clinic alone. She reports the hiatal hernia repair surgery has improved her breathing symptoms. She notes some continued issues with food getting "caught" in her chest. She is working on that, taking small bites and drinking water. She notes she is using the tramadol for pain relief as needed. She reports she is going to acupuncture to help improve her hot flashes. She notes she goes once a month and has felt improvement.   All other systems were reviewed with the patient and are negative.  MEDICAL HISTORY:  Past Medical History:  Diagnosis Date   Acid reflux    GERD   Arthritis     [facility] [place]   melanoma-left knee, right arm   Chronic back pain    Complication of anesthesia    Ductal carcinoma in situ (DCIS) of right breast 10/2017   Family history of adverse reaction to anesthesia    mother has PONV   Family history of colon cancer    Family history of melanoma    Family history of ovarian cancer    Genetic testing 12/23/2017   STAT Breast panel with reflex to Multi-Cancer panel (83 genes) @ [facility] - No pathogenic mutations detected   GERD (gastroesophageal reflux disease)    H/O hiatal hernia    Heart murmur    no problems   History of hand surgery    Hypertension    PONV (postoperative nausea and vomiting)    "zofran works great   Seasonal allergies    SVD (spontaneous vaginal delivery)    x 1   Vertigo     SURGICAL HISTORY: Past Surgical History:  Procedure Laterality Date   ABDOMINAL HYSTERECTOMY     ANTERIOR LAT  LUMBAR FUSION Right 04/17/2013   Procedure: ANTERIOR LATERAL LUMBAR FUSION 3 LEVELS;  Surgeon: Erline Levine, MD;  Location: [facility];  Service: Neurosurgery;  Laterality: Right;  Right Lumbar Two-Three Lumbar Three-Four Lumbar Four-Five  Anterolateral Fusion   BACK SURGERY     BREAST LUMPECTOMY WITH RADIOACTIVE SEED LOCALIZATION Right 12/25/2017   Procedure: RIGHT BREAST LUMPECTOMY WITH RADIOACTIVE SEED LOCALIZATION;  Surgeon: Fanny Skates, MD;  Location: [facility];  Service: General;  Laterality: Right;   [place]   COLONOSCOPY     COLONOSCOPY WITH PROPOFOL N/A 06/23/2018   Procedure: COLONOSCOPY WITH PROPOFOL;  Surgeon: Clarene Essex, MD;  Location: [facility];  Service: Endoscopy;  Laterality: N/A;   CYSTOSCOPY N/A 06/26/2016   Procedure: CYSTOSCOPY;  Surgeon: Megan Salon, MD;  Location: [facility];  Service: Gynecology;  Laterality: N/A;   DILATATION & CURETTAGE/HYSTEROSCOPY WITH MYOSURE N/A 01/24/2016   Procedure: DILATATION &  CURETTAGE/HYSTEROSCOPY WITH MYOSURE;  Surgeon: Megan Salon, MD;  Location: [facility];  Service: Gynecology;  Laterality: N/A;   FOOT SURGERY Right    early 20s-bone spur removal   HAND SURGERY Bilateral    for basal joint arthritis   LAPAROSCOPIC HYSTERECTOMY Bilateral 06/26/2016   Procedure: HYSTERECTOMY TOTAL LAPAROSCOPIC;  Surgeon: Megan Salon, MD;  Location: [facility];  Service: Gynecology;  Laterality: Bilateral;   LUMBAR PERCUTANEOUS PEDICLE SCREW 3 LEVEL N/A 04/17/2013   Procedure: LUMBAR PERCUTANEOUS PEDICLE SCREW 3 LEVEL;  Surgeon: Erline Levine, MD;  Location: [facility];  Service: Neurosurgery;  Laterality: N/A;  Decompression and Posterior Lumbar Interbody Fusion of Lumbar Five-Sacral One, Percutaneous Screws Lumbar Two through Sacral One   MELANOMA EXCISION     knee 08 lft. elbow 11rt   SALPINGOOPHORECTOMY Bilateral 06/26/2016   Procedure: SALPINGO OOPHORECTOMY;  Surgeon: Megan Salon, MD;  Location: [facility];  Service: Gynecology;  Laterality: Bilateral;   SHOULDER SURGERY Right 04/28/2015   STRABISMUS SURGERY Bilateral 07/29/2020   Procedure: BILATERAL STRABISMUS REPAIR;  Surgeon: Everitt Amber, MD;  Location: [facility];  Service: Ophthalmology;  Laterality: Bilateral;   TUBAL LIGATION  1986   [facility]  06/17/2007   and left groin lymph node   XI ROBOTIC ASSISTED HIATAL HERNIA REPAIR N/A 04/04/2021   Procedure: ROBOTIC HIATAL HERNIA REPAIR WITH FUNDOPLICATION;  Surgeon: Ralene Ok, MD;  Location: [facility];  Service: General;  Laterality: N/A;    I have reviewed the social history and family history with the patient and they are unchanged from previous note.  ALLERGIES:  is allergic to elemental sulfur.  MEDICATIONS:  Current Outpatient Medications  Medication Sig Dispense Refill   acetaminophen (TYLENOL) 500 MG tablet Take 1,000 mg by mouth 2 (two) times daily as needed for mild pain.     Ascorbic Acid (VITAMIN C WITH ROSE HIPS) 1000 MG tablet Take 1,000 mg  by mouth daily.     Calcium-Vitamin D-Vitamin K (VIACTIV CALCIUM PLUS D PO) Take 1 tablet by mouth daily.     cetirizine (ZYRTEC) 10 MG tablet Take 10 mg by mouth daily.     ibuprofen (ADVIL,MOTRIN) 200 MG tablet Take 400 mg by mouth every 6 (six) hours as needed for moderate pain.     losartan-hydrochlorothiazide (HYZAAR) 100-12.5 MG tablet Take 1 tablet by mouth daily. Reported on 01/23/2016 (Patient taking differently: Take 1 tablet by mouth daily.) 90 tablet 1   meclizine (ANTIVERT) 25 MG tablet  Take 1 tablet (25 mg total) by mouth 3 (three) times daily as needed (vertigo). Reported on 01/23/2016 30 tablet 1   Multiple Vitamins-Minerals (ALIVE WOMENS 50+) TABS Take 1 tablet by mouth daily.     ondansetron (ZOFRAN ODT) 4 MG disintegrating tablet 34m ODT q4 hours prn nausea/vomit 10 tablet 0   ondansetron (ZOFRAN-ODT) 8 MG disintegrating tablet Take 8 mg by mouth every 8 (eight) hours as needed for nausea or vomiting.     pantoprazole (PROTONIX) 40 MG tablet Take 40 mg by mouth daily before breakfast.     polyethylene glycol (MIRALAX / GLYCOLAX) packet Take 17 g by mouth daily as needed for moderate constipation.     Probiotic Product (ALIGN) 4 MG CAPS Take 4 mg by mouth at bedtime.     raloxifene (EVISTA) 60 MG tablet TAKE 1 TABLET BY MOUTH EVERY DAY (Patient taking differently: Take 60 mg by mouth daily.) 90 tablet 3   traMADol (ULTRAM) 50 MG tablet Take 50 mg by mouth every 6 (six) hours as needed (for pain).     No current facility-administered medications for this visit.    PHYSICAL EXAMINATION: ECOG PERFORMANCE STATUS: 0 - Asymptomatic  Vitals:   05/25/21 1006  BP: 120/83  Pulse: 68  Resp: 17  Temp: 98.3 F (36.8 C)  SpO2: 100%   Wt Readings from Last 3 Encounters:  05/25/21 137 lb 12.8 oz (62.5 kg)  04/04/21 143 lb 8.3 oz (65.1 kg)  03/23/21 143 lb 8 oz (65.1 kg)     GENERAL:alert, no distress and comfortable SKIN: skin color, texture, turgor are normal, no rashes, (+) small  lesions EYES: normal, Conjunctiva are pink and non-injected, sclera clear  NECK: supple, thyroid normal size, non-tender, without nodularity LYMPH:  no palpable lymphadenopathy in the cervical, axillary  LUNGS: clear to auscultation and percussion with normal breathing effort HEART: regular rate & rhythm and no murmurs and no lower extremity edema ABDOMEN:abdomen soft, non-tender and normal bowel sounds Musculoskeletal:no cyanosis of digits and no clubbing  NEURO: alert & oriented x 3 with fluent speech, no focal motor/sensory deficits BREAST: No palpable mass, nodules or adenopathy bilaterally. Breast exam benign.   LABORATORY DATA:  I have reviewed the data as listed CBC Latest Ref Rng & Units 05/25/2021 03/23/2021 02/13/2021  WBC 4.0 - 10.5 K/uL 4.7 5.3 4.8  Hemoglobin 12.0 - 15.0 g/dL 12.8 12.6 12.7  Hematocrit 36.0 - 46.0 % 37.9 37.6 37.7  Platelets 150 - 400 K/uL 233 241 243.0     CMP Latest Ref Rng & Units 05/25/2021 04/05/2021 03/23/2021  Glucose 70 - 99 mg/dL 58(L) 122(H) 94  BUN 8 - 23 mg/dL '18 12 20  ' Creatinine 0.44 - 1.00 mg/dL 0.90 0.67 0.82  Sodium 135 - 145 mmol/L 143 138 138  Potassium 3.5 - 5.1 mmol/L 3.9 3.8 3.9  Chloride 98 - 111 mmol/L 105 106 102  CO2 22 - 32 mmol/L '28 24 29  ' Calcium 8.9 - 10.3 mg/dL 9.6 8.9 9.7  Total Protein 6.5 - 8.1 g/dL 6.8 - -  Total Bilirubin 0.3 - 1.2 mg/dL 0.4 - -  Alkaline Phos 38 - 126 U/L 57 - -  AST 15 - 41 U/L 23 - -  ALT 0 - 44 U/L 21 - -      RADIOGRAPHIC STUDIES: I have personally reviewed the radiological images as listed and agreed with the findings in the report. No results found.    No orders of the defined types were placed in  this encounter.  All questions were answered. The patient knows to call the clinic with any problems, questions or concerns. No barriers to learning was detected. The total time spent in the appointment was 30 minutes.     Truitt Merle, MD 05/25/2021   I, Wilburn Mylar, am acting as scribe  for Truitt Merle, MD.   I have reviewed the above documentation for accuracy and completeness, and I agree with the above.

## 2021-05-26 DIAGNOSIS — M6281 Muscle weakness (generalized): Secondary | ICD-10-CM | POA: Diagnosis not present

## 2021-05-26 DIAGNOSIS — R262 Difficulty in walking, not elsewhere classified: Secondary | ICD-10-CM | POA: Diagnosis not present

## 2021-05-26 DIAGNOSIS — M25652 Stiffness of left hip, not elsewhere classified: Secondary | ICD-10-CM | POA: Diagnosis not present

## 2021-05-26 DIAGNOSIS — M2569 Stiffness of other specified joint, not elsewhere classified: Secondary | ICD-10-CM | POA: Diagnosis not present

## 2021-06-02 DIAGNOSIS — R262 Difficulty in walking, not elsewhere classified: Secondary | ICD-10-CM | POA: Diagnosis not present

## 2021-06-02 DIAGNOSIS — M25652 Stiffness of left hip, not elsewhere classified: Secondary | ICD-10-CM | POA: Diagnosis not present

## 2021-06-02 DIAGNOSIS — M6281 Muscle weakness (generalized): Secondary | ICD-10-CM | POA: Diagnosis not present

## 2021-06-02 DIAGNOSIS — M2569 Stiffness of other specified joint, not elsewhere classified: Secondary | ICD-10-CM | POA: Diagnosis not present

## 2021-06-12 DIAGNOSIS — M25552 Pain in left hip: Secondary | ICD-10-CM | POA: Diagnosis not present

## 2021-06-13 DIAGNOSIS — R262 Difficulty in walking, not elsewhere classified: Secondary | ICD-10-CM | POA: Diagnosis not present

## 2021-06-13 DIAGNOSIS — M25652 Stiffness of left hip, not elsewhere classified: Secondary | ICD-10-CM | POA: Diagnosis not present

## 2021-06-13 DIAGNOSIS — M2569 Stiffness of other specified joint, not elsewhere classified: Secondary | ICD-10-CM | POA: Diagnosis not present

## 2021-06-13 DIAGNOSIS — M6281 Muscle weakness (generalized): Secondary | ICD-10-CM | POA: Diagnosis not present

## 2021-06-28 DIAGNOSIS — D2339 Other benign neoplasm of skin of other parts of face: Secondary | ICD-10-CM | POA: Diagnosis not present

## 2021-06-28 DIAGNOSIS — D216 Benign neoplasm of connective and other soft tissue of trunk, unspecified: Secondary | ICD-10-CM | POA: Diagnosis not present

## 2021-06-28 DIAGNOSIS — L570 Actinic keratosis: Secondary | ICD-10-CM | POA: Diagnosis not present

## 2021-06-28 DIAGNOSIS — L814 Other melanin hyperpigmentation: Secondary | ICD-10-CM | POA: Diagnosis not present

## 2021-06-28 DIAGNOSIS — L821 Other seborrheic keratosis: Secondary | ICD-10-CM | POA: Diagnosis not present

## 2021-06-28 DIAGNOSIS — L565 Disseminated superficial actinic porokeratosis (DSAP): Secondary | ICD-10-CM | POA: Diagnosis not present

## 2021-06-28 DIAGNOSIS — Z85820 Personal history of malignant melanoma of skin: Secondary | ICD-10-CM | POA: Diagnosis not present

## 2021-07-03 DIAGNOSIS — M546 Pain in thoracic spine: Secondary | ICD-10-CM | POA: Diagnosis not present

## 2021-07-03 DIAGNOSIS — M545 Low back pain, unspecified: Secondary | ICD-10-CM | POA: Diagnosis not present

## 2021-07-03 DIAGNOSIS — M4154 Other secondary scoliosis, thoracic region: Secondary | ICD-10-CM | POA: Diagnosis not present

## 2021-07-03 DIAGNOSIS — M419 Scoliosis, unspecified: Secondary | ICD-10-CM | POA: Diagnosis not present

## 2021-07-03 DIAGNOSIS — M5415 Radiculopathy, thoracolumbar region: Secondary | ICD-10-CM | POA: Diagnosis not present

## 2021-07-17 ENCOUNTER — Ambulatory Visit (HOSPITAL_BASED_OUTPATIENT_CLINIC_OR_DEPARTMENT_OTHER): Payer: BC Managed Care – PPO | Admitting: Obstetrics & Gynecology

## 2021-07-17 ENCOUNTER — Encounter (HOSPITAL_BASED_OUTPATIENT_CLINIC_OR_DEPARTMENT_OTHER): Payer: Self-pay

## 2021-08-09 DIAGNOSIS — M545 Low back pain, unspecified: Secondary | ICD-10-CM | POA: Diagnosis not present

## 2021-08-09 DIAGNOSIS — M2569 Stiffness of other specified joint, not elsewhere classified: Secondary | ICD-10-CM | POA: Diagnosis not present

## 2021-08-09 DIAGNOSIS — M6281 Muscle weakness (generalized): Secondary | ICD-10-CM | POA: Diagnosis not present

## 2021-08-09 DIAGNOSIS — M419 Scoliosis, unspecified: Secondary | ICD-10-CM | POA: Diagnosis not present

## 2021-08-10 DIAGNOSIS — M545 Low back pain, unspecified: Secondary | ICD-10-CM | POA: Diagnosis not present

## 2021-08-10 DIAGNOSIS — M6281 Muscle weakness (generalized): Secondary | ICD-10-CM | POA: Diagnosis not present

## 2021-08-10 DIAGNOSIS — M419 Scoliosis, unspecified: Secondary | ICD-10-CM | POA: Diagnosis not present

## 2021-08-10 DIAGNOSIS — M2569 Stiffness of other specified joint, not elsewhere classified: Secondary | ICD-10-CM | POA: Diagnosis not present

## 2021-08-14 DIAGNOSIS — M2569 Stiffness of other specified joint, not elsewhere classified: Secondary | ICD-10-CM | POA: Diagnosis not present

## 2021-08-14 DIAGNOSIS — M6281 Muscle weakness (generalized): Secondary | ICD-10-CM | POA: Diagnosis not present

## 2021-08-14 DIAGNOSIS — M419 Scoliosis, unspecified: Secondary | ICD-10-CM | POA: Diagnosis not present

## 2021-08-14 DIAGNOSIS — M545 Low back pain, unspecified: Secondary | ICD-10-CM | POA: Diagnosis not present

## 2021-08-15 DIAGNOSIS — M419 Scoliosis, unspecified: Secondary | ICD-10-CM | POA: Diagnosis not present

## 2021-08-15 DIAGNOSIS — M6281 Muscle weakness (generalized): Secondary | ICD-10-CM | POA: Diagnosis not present

## 2021-08-15 DIAGNOSIS — M545 Low back pain, unspecified: Secondary | ICD-10-CM | POA: Diagnosis not present

## 2021-08-15 DIAGNOSIS — M2569 Stiffness of other specified joint, not elsewhere classified: Secondary | ICD-10-CM | POA: Diagnosis not present

## 2021-08-21 ENCOUNTER — Ambulatory Visit: Payer: BC Managed Care – PPO | Admitting: Family Medicine

## 2021-08-21 DIAGNOSIS — M2569 Stiffness of other specified joint, not elsewhere classified: Secondary | ICD-10-CM | POA: Diagnosis not present

## 2021-08-21 DIAGNOSIS — M6281 Muscle weakness (generalized): Secondary | ICD-10-CM | POA: Diagnosis not present

## 2021-08-21 DIAGNOSIS — M545 Low back pain, unspecified: Secondary | ICD-10-CM | POA: Diagnosis not present

## 2021-08-21 DIAGNOSIS — M419 Scoliosis, unspecified: Secondary | ICD-10-CM | POA: Diagnosis not present

## 2021-08-22 ENCOUNTER — Other Ambulatory Visit: Payer: Self-pay

## 2021-08-22 ENCOUNTER — Ambulatory Visit (INDEPENDENT_AMBULATORY_CARE_PROVIDER_SITE_OTHER): Payer: BC Managed Care – PPO | Admitting: Family Medicine

## 2021-08-22 ENCOUNTER — Encounter: Payer: Self-pay | Admitting: Family Medicine

## 2021-08-22 VITALS — BP 124/78 | HR 72 | Temp 97.1°F | Ht 62.0 in | Wt 140.4 lb

## 2021-08-22 DIAGNOSIS — I10 Essential (primary) hypertension: Secondary | ICD-10-CM

## 2021-08-22 DIAGNOSIS — E7800 Pure hypercholesterolemia, unspecified: Secondary | ICD-10-CM

## 2021-08-22 DIAGNOSIS — Z5181 Encounter for therapeutic drug level monitoring: Secondary | ICD-10-CM | POA: Diagnosis not present

## 2021-08-22 LAB — B12 AND FOLATE PANEL
Folate: >23.4 ng/mL (ref >5.9)
Vitamin B-12: 578 pg/mL (ref 211–911)

## 2021-08-22 MED ORDER — ATORVASTATIN CALCIUM 10 MG PO TABS: 10.0000 mg | ORAL_TABLET | Freq: Every day | ORAL | 3 refills | Status: DC

## 2021-08-22 NOTE — Progress Notes (Signed)
Established Patient Office Visit  Subjective:  Patient ID: EVOLEHT HOVATTER, female    DOB: [birth year]  Age: 65 y.o. MRN: [id number]  CC:  Chief Complaint  Patient presents with   Follow-up    6 month follow up, patient not fasting.     HPI Melissa Mcclure presents for follow-up of hypertension and elevated LDL cholesterol with a favorable HDL level.  Melissa Mcclure does not smoke or have any known history of vascular disease.  Blood pressure is well controlled with Hyzaar.  Not aware of vascular disease in her immediate family that her mom did have diabetes.  Status post recent Nissen fundoplication in July of this year.  Reflux has improved.  Still has dysphagia with rice.  Currently tapering off the Protonix and wonders about long-term side effects of taking that medication  Past Medical History:  Diagnosis Date   Acid reflux    GERD   Arthritis    Cancer ([place]) 04/2010   melanoma-left knee, right arm   Chronic back pain    Complication of anesthesia    Ductal carcinoma in situ (DCIS) of right breast 10/2017   Family history of adverse reaction to anesthesia    mother has PONV   Family history of colon cancer    Family history of melanoma    Family history of ovarian cancer    Genetic testing 12/23/2017   STAT Breast panel with reflex to Multi-Cancer panel (83 genes) @ [facility] - No pathogenic mutations detected   GERD (gastroesophageal reflux disease)    H/O hiatal hernia    Heart murmur    no problems   History of hand surgery    Hypertension    PONV (postoperative nausea and vomiting)    "zofran works great   Seasonal allergies    SVD (spontaneous vaginal delivery)    x 1   Vertigo     Past Surgical History:  Procedure Laterality Date   ABDOMINAL HYSTERECTOMY     ANTERIOR LAT LUMBAR FUSION Right 04/17/2013   Procedure: ANTERIOR LATERAL LUMBAR FUSION 3 LEVELS;  Surgeon: Erline Levine, MD;  Location: [facility];  Service: Neurosurgery;  Laterality: Right;  Right Lumbar Two-Three  Lumbar Three-Four Lumbar Four-Five  Anterolateral Fusion   BACK SURGERY     BREAST LUMPECTOMY WITH RADIOACTIVE SEED LOCALIZATION Right 12/25/2017   Procedure: RIGHT BREAST LUMPECTOMY WITH RADIOACTIVE SEED LOCALIZATION;  Surgeon: Fanny Skates, MD;  Location: [facility];  Service: General;  Laterality: Right;   [place]   COLONOSCOPY     COLONOSCOPY WITH PROPOFOL N/A 06/23/2018   Procedure: COLONOSCOPY WITH PROPOFOL;  Surgeon: Clarene Essex, MD;  Location: [facility];  Service: Endoscopy;  Laterality: N/A;   CYSTOSCOPY N/A 06/26/2016   Procedure: CYSTOSCOPY;  Surgeon: Megan Salon, MD;  Location: [facility];  Service: Gynecology;  Laterality: N/A;   DILATATION & CURETTAGE/HYSTEROSCOPY WITH MYOSURE N/A 01/24/2016   Procedure: DILATATION & CURETTAGE/HYSTEROSCOPY WITH MYOSURE;  Surgeon: Megan Salon, MD;  Location: [facility];  Service: Gynecology;  Laterality: N/A;   FOOT SURGERY Right    early 20s-bone spur removal   HAND SURGERY Bilateral    for basal joint arthritis   LAPAROSCOPIC HYSTERECTOMY Bilateral 06/26/2016   Procedure: HYSTERECTOMY TOTAL LAPAROSCOPIC;  Surgeon: Megan Salon, MD;  Location: [facility];  Service: Gynecology;  Laterality: Bilateral;   LUMBAR PERCUTANEOUS PEDICLE SCREW 3 LEVEL N/A 04/17/2013  Procedure: LUMBAR PERCUTANEOUS PEDICLE SCREW 3 LEVEL;  Surgeon: Erline Levine, MD;  Location: [facility];  Service: Neurosurgery;  Laterality: N/A;  Decompression and Posterior Lumbar Interbody Fusion of Lumbar Five-Sacral One, Percutaneous Screws Lumbar Two through Sacral One   MELANOMA EXCISION     knee 08 lft. elbow 11rt   SALPINGOOPHORECTOMY Bilateral 06/26/2016   Procedure: SALPINGO OOPHORECTOMY;  Surgeon: Megan Salon, MD;  Location: [facility];  Service: Gynecology;  Laterality: Bilateral;   SHOULDER SURGERY Right 04/28/2015   STRABISMUS SURGERY Bilateral 07/29/2020   Procedure: BILATERAL STRABISMUS REPAIR;   Surgeon: Everitt Amber, MD;  Location: [facility];  Service: Ophthalmology;  Laterality: Bilateral;   TUBAL LIGATION  1986   WLE  06/17/2007   and left groin lymph node   XI ROBOTIC ASSISTED HIATAL HERNIA REPAIR N/A 04/04/2021   Procedure: ROBOTIC HIATAL HERNIA REPAIR WITH FUNDOPLICATION;  Surgeon: Ralene Ok, MD;  Location: [facility];  Service: General;  Laterality: N/A;    Family History  Problem Relation Age of Onset   Diabetes Father        borderline   Melanoma Father        dx 12s/60s; deceased 53s   Congestive Heart Failure Father    Colon cancer Mother        dx 35s; deceased 21   Hypertension Mother    Osteoporosis Mother    Ovarian cancer Maternal Grandmother        deceased 22s   Heart disease Paternal Grandfather    Melanoma Daughter        on leg; dx 14s    Social History   Socioeconomic History   Marital status: Married    Spouse name: Not on file   Number of children: Not on file   Years of education: Not on file   Highest education level: Not on file  Occupational History   Not on file  Tobacco Use   Smoking status: Never   Smokeless tobacco: Never  Vaping Use   Vaping Use: Never used  Substance and Sexual Activity   Alcohol use: Yes    Alcohol/week: 0.0 - 1.0 standard drinks   Drug use: No   Sexual activity: Not Currently    Partners: Male    Birth control/protection: Surgical, Post-menopausal    Comment: hysterectomy  Other Topics Concern   Not on file  Social History Narrative   Not on file   Social Determinants of Health   Financial Resource Strain: Not on file  Food Insecurity: Not on file  Transportation Needs: Not on file  Physical Activity: Not on file  Stress: Not on file  Social Connections: Not on file  Intimate Partner Violence: Not on file    Outpatient Medications Prior to Visit  Medication Sig Dispense Refill   acetaminophen (TYLENOL) 500 MG tablet Take 1,000 mg by mouth 2 (two) times daily as needed  for mild pain.     Ascorbic Acid (VITAMIN C WITH ROSE HIPS) 1000 MG tablet Take 1,000 mg by mouth daily.     Calcium-Vitamin D-Vitamin K (VIACTIV CALCIUM PLUS D PO) Take 1 tablet by mouth daily.     cetirizine (ZYRTEC) 10 MG tablet Take 10 mg by mouth daily.     losartan-hydrochlorothiazide (HYZAAR) 100-12.5 MG tablet Take 1 tablet by mouth daily. Reported on 01/23/2016 (Patient taking differently: Take 1 tablet by mouth daily.) 90 tablet 1   meclizine (ANTIVERT) 25 MG tablet Take 1 tablet (25 mg total) by mouth  3 (three) times daily as needed (vertigo). Reported on 01/23/2016 30 tablet 1   Multiple Vitamins-Minerals (ALIVE WOMENS 50+) TABS Take 1 tablet by mouth daily.     ondansetron (ZOFRAN ODT) 4 MG disintegrating tablet 4mg  ODT q4 hours prn nausea/vomit 10 tablet 0   ondansetron (ZOFRAN-ODT) 8 MG disintegrating tablet Take 8 mg by mouth every 8 (eight) hours as needed for nausea or vomiting.     pantoprazole (PROTONIX) 40 MG tablet Take 40 mg by mouth daily before breakfast.     polyethylene glycol (MIRALAX / GLYCOLAX) packet Take 17 g by mouth daily as needed for moderate constipation.     Probiotic Product (ALIGN) 4 MG CAPS Take 4 mg by mouth at bedtime.     raloxifene (EVISTA) 60 MG tablet TAKE 1 TABLET BY MOUTH EVERY DAY (Patient taking differently: Take 60 mg by mouth daily.) 90 tablet 3   traMADol (ULTRAM) 50 MG tablet Take 50 mg by mouth every 6 (six) hours as needed (for pain).     ibuprofen (ADVIL,MOTRIN) 200 MG tablet Take 400 mg by mouth every 6 (six) hours as needed for moderate pain. (Patient not taking: Reported on 08/22/2021)     No facility-administered medications prior to visit.    Allergies  Allergen Reactions   Elemental Sulfur Hives    ROS Review of Systems  Constitutional:  Negative for diaphoresis, fatigue, fever and unexpected weight change.  HENT: Negative.    Eyes:  Negative for photophobia and visual disturbance.  Respiratory: Negative.  Negative for chest  tightness, shortness of breath and wheezing.   Cardiovascular: Negative.   Gastrointestinal: Negative.   Endocrine: Negative for polyphagia and polyuria.  Genitourinary: Negative.   Musculoskeletal:  Positive for back pain.  Neurological:  Negative for weakness and headaches.     Objective:    Physical Exam Vitals and nursing note reviewed.  Constitutional:      Appearance: Normal appearance. Melissa Mcclure is normal weight.  HENT:     Head: Normocephalic and atraumatic.     Right Ear: External ear normal.     Left Ear: External ear normal.     Mouth/Throat:     Pharynx: No oropharyngeal exudate or posterior oropharyngeal erythema.  Eyes:     General: No scleral icterus.       Right eye: No discharge.        Left eye: No discharge.     Extraocular Movements: Extraocular movements intact.     Conjunctiva/sclera: Conjunctivae normal.  Neck:     Vascular: No carotid bruit.  Cardiovascular:     Rate and Rhythm: Normal rate and regular rhythm.  Pulmonary:     Effort: Pulmonary effort is normal.     Breath sounds: Normal breath sounds.  Musculoskeletal:     Cervical back: No rigidity or tenderness.  Lymphadenopathy:     Cervical: No cervical adenopathy.  Skin:    General: Skin is warm and dry.  Neurological:     Mental Status: Melissa Mcclure is alert and oriented to person, place, and time.  Psychiatric:        Mood and Affect: Mood normal.        Behavior: Behavior normal.    BP 124/78 (BP Location: Left Arm, Patient Position: Sitting, Cuff Size: Normal)   Pulse 72   Temp (!) 97.1 F (36.2 C) (Temporal)   Ht 5\' 2"  (1.575 m)   Wt 140 lb 6.4 oz (63.7 kg)   LMP 06/24/2008   SpO2 97%  BMI 25.68 kg/m  Wt Readings from Last 3 Encounters:  08/22/21 140 lb 6.4 oz (63.7 kg)  05/25/21 137 lb 12.8 oz (62.5 kg)  04/04/21 143 lb 8.3 oz (65.1 kg)     Health Maintenance Due  Topic Date Due   Pneumonia Vaccine 64+ Years old (1 - PCV) Never done   Hepatitis C Screening  Never done   COVID-19  Vaccine (3 - Pfizer risk series) 03/28/2020    There are no preventive care reminders to display for this patient.  No results found for: TSH Lab Results  Component Value Date   WBC 4.7 05/25/2021   HGB 12.8 05/25/2021   HCT 37.9 05/25/2021   MCV 93.3 05/25/2021   PLT 233 05/25/2021   Lab Results  Component Value Date   NA 143 05/25/2021   K 3.9 05/25/2021   CO2 28 05/25/2021   GLUCOSE 58 (L) 05/25/2021   BUN 18 05/25/2021   CREATININE 0.90 05/25/2021   BILITOT 0.4 05/25/2021   ALKPHOS 57 05/25/2021   AST 23 05/25/2021   ALT 21 05/25/2021   PROT 6.8 05/25/2021   ALBUMIN 4.1 05/25/2021   CALCIUM 9.6 05/25/2021   ANIONGAP 10 05/25/2021   GFR 90.87 02/13/2021   Lab Results  Component Value Date   CHOL 207 (H) 02/13/2021   Lab Results  Component Value Date   HDL 77.00 02/13/2021   Lab Results  Component Value Date   LDLCALC 116 (H) 02/13/2021   Lab Results  Component Value Date   TRIG 66.0 02/13/2021   Lab Results  Component Value Date   CHOLHDL 3 02/13/2021   No results found for: HGBA1C   The 10-year ASCVD risk score (Arnett DK, et al., 2019) is: 7%   Values used to calculate the score:     Age: 37 years     Sex: Female     Is Non-Hispanic African American: No     Diabetic: No     Tobacco smoker: No     Systolic Blood Pressure: 263 mmHg     Is BP treated: Yes     HDL Cholesterol: 77 mg/dL     Total Cholesterol: 207 mg/dL  Assessment & Plan:   Problem List Items Addressed This Visit       Cardiovascular and Mediastinum   Essential hypertension - Primary   Relevant Medications   atorvastatin (LIPITOR) 10 MG tablet     Other   Medication monitoring encounter   Relevant Orders   B12 and Folate Panel   Elevated LDL cholesterol level   Relevant Medications   atorvastatin (LIPITOR) 10 MG tablet    Meds ordered this encounter  Medications   atorvastatin (LIPITOR) 10 MG tablet    Sig: Take 1 tablet (10 mg total) by mouth daily.    Dispense:   90 tablet    Refill:  3    Follow-up: Return in about 3 months (around 11/21/2021).  Patient falls into the intermediate risk or category of the 10-year ASCVD risk calculation.  Melissa Mcclure would like to try a statin to lower her risk of vascular disease.  I agree.  We will start low-dose atorvastatin.  Asked her to report any unusual joint aches and pains.  Melissa Mcclure was given information on atorvastatin.  Libby Maw, MD

## 2021-08-23 ENCOUNTER — Ambulatory Visit (HOSPITAL_BASED_OUTPATIENT_CLINIC_OR_DEPARTMENT_OTHER): Payer: BC Managed Care – PPO | Admitting: Obstetrics & Gynecology

## 2021-08-24 ENCOUNTER — Other Ambulatory Visit: Payer: Self-pay | Admitting: Family Medicine

## 2021-08-24 DIAGNOSIS — I10 Essential (primary) hypertension: Secondary | ICD-10-CM

## 2021-08-29 DIAGNOSIS — H33102 Unspecified retinoschisis, left eye: Secondary | ICD-10-CM | POA: Diagnosis not present

## 2021-08-29 DIAGNOSIS — H2513 Age-related nuclear cataract, bilateral: Secondary | ICD-10-CM | POA: Diagnosis not present

## 2021-08-29 DIAGNOSIS — H40013 Open angle with borderline findings, low risk, bilateral: Secondary | ICD-10-CM | POA: Diagnosis not present

## 2021-08-29 DIAGNOSIS — H25013 Cortical age-related cataract, bilateral: Secondary | ICD-10-CM | POA: Diagnosis not present

## 2021-08-30 DIAGNOSIS — M419 Scoliosis, unspecified: Secondary | ICD-10-CM | POA: Diagnosis not present

## 2021-08-30 DIAGNOSIS — M545 Low back pain, unspecified: Secondary | ICD-10-CM | POA: Diagnosis not present

## 2021-08-30 DIAGNOSIS — M6281 Muscle weakness (generalized): Secondary | ICD-10-CM | POA: Diagnosis not present

## 2021-08-30 DIAGNOSIS — M2569 Stiffness of other specified joint, not elsewhere classified: Secondary | ICD-10-CM | POA: Diagnosis not present

## 2021-09-07 ENCOUNTER — Ambulatory Visit (INDEPENDENT_AMBULATORY_CARE_PROVIDER_SITE_OTHER): Payer: BC Managed Care – PPO | Admitting: Obstetrics & Gynecology

## 2021-09-07 ENCOUNTER — Encounter (HOSPITAL_BASED_OUTPATIENT_CLINIC_OR_DEPARTMENT_OTHER): Payer: Self-pay | Admitting: Obstetrics & Gynecology

## 2021-09-07 ENCOUNTER — Other Ambulatory Visit: Payer: Self-pay

## 2021-09-07 VITALS — BP 160/90 | HR 60 | Ht 62.0 in | Wt 139.8 lb

## 2021-09-07 DIAGNOSIS — Z01419 Encounter for gynecological examination (general) (routine) without abnormal findings: Secondary | ICD-10-CM

## 2021-09-07 DIAGNOSIS — Z800 Family history of malignant neoplasm of digestive organs: Secondary | ICD-10-CM | POA: Diagnosis not present

## 2021-09-07 DIAGNOSIS — Z8041 Family history of malignant neoplasm of ovary: Secondary | ICD-10-CM

## 2021-09-07 DIAGNOSIS — R151 Fecal smearing: Secondary | ICD-10-CM | POA: Diagnosis not present

## 2021-09-07 DIAGNOSIS — D0511 Intraductal carcinoma in situ of right breast: Secondary | ICD-10-CM | POA: Diagnosis not present

## 2021-09-07 NOTE — Patient Instructions (Addendum)
You do need to have the pneumovax vaccination.  You had Prevnar-13 last year.  Dr. Carlean Purl, [facility]  Metamucil

## 2021-09-07 NOTE — Progress Notes (Signed)
65 y.o. G3P3 Married White or Caucasian female here for AEX.  Doing well.  Had robotic hiatal hernia repair.  Has done well since surgery.  Denies vaginal bleeding.  H/o TLH/BSO 10/17.  Having acupuncture for hot flashes.  This has helped.    H/o DICS and on Raloxifene.    Having issues with stool leakage.  Can have incontinence at times.  Isn't always aware this is occurring.  Has discussed this with Dr. Watt Climes who told her she wasn't fully emptying with bowel movements.  She doesn't know how to   Patient's last menstrual period was 06/24/2008.           Health Maintenance: PCP:  Dr. Ethelene Hal.  Last wellness appt was 05/2021.  Did blood work at that appt:  yes Vaccines are up to date:  needs pneumovax and has not done any other covid vaccines Colonoscopy:  06/23/2018 MMG:  12/13/2020 BMD:  12/10/2019, normal Last pap smear:  12/30/2015 .   H/o abnormal pap smear:  no    reports that she has never smoked. She has never used smokeless tobacco. She reports current alcohol use. She reports that she does not use drugs.  Past Medical History:  Diagnosis Date   Arthritis    Cancer ([place]) 04/2010   melanoma-left knee, right arm   Chronic back pain    Complication of anesthesia    Ductal carcinoma in situ (DCIS) of right breast 10/2017   Family history of adverse reaction to anesthesia    mother has PONV   Family history of colon cancer    Family history of melanoma    Family history of ovarian cancer    Genetic testing 12/23/2017   STAT Breast panel with reflex to Multi-Cancer panel (83 genes) @ [facility] - No pathogenic mutations detected   GERD (gastroesophageal reflux disease)    H/O hiatal hernia    Heart murmur    no problems   History of hand surgery    Hypertension    PONV (postoperative nausea and vomiting)    "zofran works great   Seasonal allergies    Vertigo     Past Surgical History:  Procedure Laterality Date   ABDOMINAL HYSTERECTOMY     ANTERIOR LAT LUMBAR FUSION  Right 04/17/2013   Procedure: ANTERIOR LATERAL LUMBAR FUSION 3 LEVELS;  Surgeon: Erline Levine, MD;  Location: [facility];  Service: Neurosurgery;  Laterality: Right;  Right Lumbar Two-Three Lumbar Three-Four Lumbar Four-Five  Anterolateral Fusion   BACK SURGERY     BREAST LUMPECTOMY WITH RADIOACTIVE SEED LOCALIZATION Right 12/25/2017   Procedure: RIGHT BREAST LUMPECTOMY WITH RADIOACTIVE SEED LOCALIZATION;  Surgeon: Fanny Skates, MD;  Location: [facility];  Service: General;  Laterality: Right;   [place]   COLONOSCOPY     COLONOSCOPY WITH PROPOFOL N/A 06/23/2018   Procedure: COLONOSCOPY WITH PROPOFOL;  Surgeon: Clarene Essex, MD;  [facility];  Service: Endoscopy;  Laterality: N/A;   CYSTOSCOPY N/A 06/26/2016   Procedure: CYSTOSCOPY;  Surgeon: Megan Salon, MD;  Location: [facility];  Service: Gynecology;  Laterality: N/A;   DILATATION & CURETTAGE/HYSTEROSCOPY WITH MYOSURE N/A 01/24/2016   Procedure: DILATATION & CURETTAGE/HYSTEROSCOPY WITH MYOSURE;  Surgeon: Megan Salon, MD;  Location: [facility];  Service: Gynecology;  Laterality: N/A;   FOOT SURGERY Right    early 20s-bone spur removal   HAND SURGERY Bilateral    for basal  joint arthritis   LAPAROSCOPIC HYSTERECTOMY Bilateral 06/26/2016   Procedure: HYSTERECTOMY TOTAL LAPAROSCOPIC;  Surgeon: Megan Salon, MD;  Location: [facility];  Service: Gynecology;  Laterality: Bilateral;   LUMBAR PERCUTANEOUS PEDICLE SCREW 3 LEVEL N/A 04/17/2013   Procedure: LUMBAR PERCUTANEOUS PEDICLE SCREW 3 LEVEL;  Surgeon: Erline Levine, MD;  Location: [facility];  Service: Neurosurgery;  Laterality: N/A;  Decompression and Posterior Lumbar Interbody Fusion of Lumbar Five-Sacral One, Percutaneous Screws Lumbar Two through Sacral One   MELANOMA EXCISION     knee 08 lft. elbow 11rt   SALPINGOOPHORECTOMY Bilateral 06/26/2016   Procedure: SALPINGO OOPHORECTOMY;  Surgeon: Megan Salon,  MD;  Location: [facility];  Service: Gynecology;  Laterality: Bilateral;   SHOULDER SURGERY Right 04/28/2015   STRABISMUS SURGERY Bilateral 07/29/2020   Procedure: BILATERAL STRABISMUS REPAIR;  Surgeon: Everitt Amber, MD;  Location: [facility];  Service: Ophthalmology;  Laterality: Bilateral;   TUBAL LIGATION  1986   WLE  06/17/2007   and left groin lymph node   XI ROBOTIC ASSISTED HIATAL HERNIA REPAIR N/A 04/04/2021   Procedure: ROBOTIC HIATAL HERNIA REPAIR WITH FUNDOPLICATION;  Surgeon: Ralene Ok, MD;  Location: [facility];  Service: General;  Laterality: N/A;    Current Outpatient Medications  Medication Sig Dispense Refill   acetaminophen (TYLENOL) 500 MG tablet Take 1,000 mg by mouth 2 (two) times daily as needed for mild pain.     Ascorbic Acid (VITAMIN C WITH ROSE HIPS) 1000 MG tablet Take 1,000 mg by mouth daily.     atorvastatin (LIPITOR) 10 MG tablet Take 1 tablet (10 mg total) by mouth daily. 90 tablet 3   Calcium-Vitamin D-Vitamin K (VIACTIV CALCIUM PLUS D PO) Take 1 tablet by mouth daily.     cetirizine (ZYRTEC) 10 MG tablet Take 10 mg by mouth daily.     losartan-hydrochlorothiazide (HYZAAR) 100-12.5 MG tablet TAKE 1 TABLET BY MOUTH DAILY. REPORTED ON 01/23/2016 90 tablet 1   meclizine (ANTIVERT) 25 MG tablet Take 1 tablet (25 mg total) by mouth 3 (three) times daily as needed (vertigo). Reported on 01/23/2016 30 tablet 1   Multiple Vitamins-Minerals (ALIVE WOMENS 50+) TABS Take 1 tablet by mouth daily.     ondansetron (ZOFRAN-ODT) 8 MG disintegrating tablet Take 8 mg by mouth every 8 (eight) hours as needed for nausea or vomiting.     polyethylene glycol (MIRALAX / GLYCOLAX) packet Take 17 g by mouth daily as needed for moderate constipation.     Probiotic Product (ALIGN) 4 MG CAPS Take 4 mg by mouth at bedtime.     raloxifene (EVISTA) 60 MG tablet TAKE 1 TABLET BY MOUTH EVERY DAY (Patient taking differently: Take 60 mg by mouth daily.) 90 tablet 3   traMADol (ULTRAM)  50 MG tablet Take 50 mg by mouth every 6 (six) hours as needed (for pain).     No current facility-administered medications for this visit.    Family History  Problem Relation Age of Onset   Diabetes Father        borderline   Melanoma Father        dx 27s/60s; deceased 53s   Congestive Heart Failure Father    Colon cancer Mother        dx 74s; deceased 43   Hypertension Mother    Osteoporosis Mother    Ovarian cancer Maternal Grandmother        deceased 73s   Heart disease Paternal Grandfather    Melanoma Daughter  on leg; dx 30s    Review of Systems  All other systems reviewed and are negative.  Exam:   BP (!) 160/90 (BP Location: Right Arm, Patient Position: Sitting, Cuff Size: Normal)    Pulse 60    Ht 5\' 2"  (1.575 m) Comment: reported   Wt 139 lb 12.8 oz (63.4 kg)    LMP 06/24/2008    BMI 25.57 kg/m   Height: 5\' 2"  (157.5 cm) (reported)  General appearance: alert, cooperative and appears stated age Breasts: normal appearance, no masses or tenderness Abdomen: soft, non-tender; bowel sounds normal; no masses,  no organomegaly Lymph nodes: Cervical, supraclavicular, and axillary nodes normal.  No abnormal inguinal nodes palpated Neurologic: Grossly normal  Pelvic: External genitalia:  no lesions              Urethra:  normal appearing urethra with no masses, tenderness or lesions              Bartholins and Skenes: normal                 Vagina: normal appearing vagina with atrophic changes and no discharge, no lesions              Cervix: absent              Pap taken: No. Bimanual Exam:  Uterus:  uterus absent              Adnexa: no mass, fullness, tenderness               Rectovaginal: Confirms               Anus:  normal sphincter tone, no lesions  Chaperone, Ezekiel Ina, RN, was present for exam.  Assessment/Plan: 1. Well woman exam with routine gynecological exam - pap smear not indicated - MMG 11/2020 - BMD 11/2019 - colonoscopy 2019 - lab work up  to date - vaccines reviewed/updated  2. Fecal smearing - Ambulatory referral to Physical Therapy  3. Ductal carcinoma in situ (DCIS) of right breast - followed by Dr. Burr Medico - on Raloxifene for two more years  4. Family history of colon cancer - last colonoscopy 2024  5. Family history of ovarian cancer

## 2021-09-11 DIAGNOSIS — M2569 Stiffness of other specified joint, not elsewhere classified: Secondary | ICD-10-CM | POA: Diagnosis not present

## 2021-09-11 DIAGNOSIS — M419 Scoliosis, unspecified: Secondary | ICD-10-CM | POA: Diagnosis not present

## 2021-09-11 DIAGNOSIS — M6281 Muscle weakness (generalized): Secondary | ICD-10-CM | POA: Diagnosis not present

## 2021-09-11 DIAGNOSIS — M545 Low back pain, unspecified: Secondary | ICD-10-CM | POA: Diagnosis not present

## 2021-09-24 HISTORY — PX: SHOULDER SURGERY: SHX246

## 2021-10-09 ENCOUNTER — Ambulatory Visit: Payer: BC Managed Care – PPO | Admitting: Physical Therapy

## 2021-10-10 ENCOUNTER — Other Ambulatory Visit: Payer: Self-pay

## 2021-10-10 ENCOUNTER — Ambulatory Visit: Payer: BC Managed Care – PPO | Attending: Obstetrics & Gynecology

## 2021-10-10 DIAGNOSIS — M62838 Other muscle spasm: Secondary | ICD-10-CM | POA: Diagnosis not present

## 2021-10-10 DIAGNOSIS — M6281 Muscle weakness (generalized): Secondary | ICD-10-CM | POA: Insufficient documentation

## 2021-10-10 DIAGNOSIS — R279 Unspecified lack of coordination: Secondary | ICD-10-CM | POA: Insufficient documentation

## 2021-10-10 DIAGNOSIS — R293 Abnormal posture: Secondary | ICD-10-CM | POA: Diagnosis not present

## 2021-10-10 DIAGNOSIS — R151 Fecal smearing: Secondary | ICD-10-CM | POA: Diagnosis not present

## 2021-10-10 NOTE — Patient Instructions (Addendum)
A high fiber diet with plenty of fluids (up to 8 glasses of water daily) is suggested to relieve these symptoms.  Metamucil, 1 tablespoon once or twice daily can be used to keep bowels regular if needed.  Squatty potty or small step stool under your feet while you're trying to have a bowel movement.    Strategies for Constipation - Developing a Bowel Routine  Bowels love routine.  Choose the best time of day to have a bowel movement.  Usually the best time of day for a bowel movement in 30 min to 1 hour after breakfast or lunch.  It can take a week or longer to transition to new bowel routines, but if you follow the guidelines below, you will start to retrain your system for more successful bowel routines.   For right now, I want you to go to the bathroom and see if your body will have a bowel movement after each meal. Go within 5 minutes of finishing a meal and then don't sit on the toilet for longer than 5 minutes trying. No straining!  Eat breakfast every morning to try to get things moving along.  Eating stimulates the bowels through turning on the gastrocolic reflex, which are the wave-like contractions of smooth muscle in your intestines to help move feces along.  Adding in a hot beverage with breakfast may help as well.  Think of your digestive tract as being a conveyor belt - new food comes in and pushes along old food, with the end of the line being a bowel movement.  You may add 1/4 cup or 1/3 cup of prune juice every evening to help stimulate morning bowel function until you see more successful bowel function.  Create routine in both your timing and portions of intake of food and drink throughout the day.  Eat each of your meals at consistent times of the day.  For portions, the size of each meal may vary, but try to eat consistent portions each day at breakfast, lunch and dinner.  For example, you may have a small breakfast every day and a big lunch every day. This is fine if it is a  consistent pattern of intake.  With each meal, aim for at least 2 servings of fruits and vegetables and at least one serving of whole grains (whole grain cereal, brown rice, bran, whole wheat or rye bread, oatmeal) to get some fiber in your system.  These foods provide soft bulk for the bowel.  Drink water.  Aim for at least 8 large glasses of water a day.  Water helps the stool stay softer and easier to pass.  If you are adding more fiber in your diet, be sure to increase your water intake too.  Exercise can help move things along the digestive tract.  Exercise may be a walk, jog, yoga, exercise video, gym time, or even household walking or marching in place at the kitchen countertop.  Pairing exercise and breakfast as part of your morning routine may help establish a morning bowel routine.  Listen to your body's signals that it is time to go to the bathroom!  Our body has automatic reflexes that signal when it is time to have a bowel movement.  If you are establishing routines to help stimulate your bowels, be sure to allow time for toileting.  For example, if you typically have a bowel movement after breakfast, get up early enough to eat AND empty your bowels before your schedule gets too  busy or before it becomes inconvenient to access a bathroom.  If we routinely delay or hold back a bowel movement, these reflexes can stop signaling, leading to constipation which may become chronic (long-term).    Ensure proper toileting technique including posture, breathing and emptying strategies.*  Abdominal massage may also help stimulate your bowels and reduce the discomfort that accompanies constipation.*  *Your PT can educate you on toileting strategies and self-massage for your abdomen.  Your PT may also ask you to complete a food log and bowel diary for 1-3 days to help identify patterns and make suggestions for increased success in managing your symptoms.

## 2021-10-10 NOTE — Therapy (Signed)
[facility] [place] @ [address] [place], [place], [address] Phone: [phone]   Fax:  [phone]  Physical Therapy Evaluation  Patient Details  Name: Melissa Mcclure MRN: [id number] Date of Birth: [birth year] Referring Provider (PT): Megan Salon, MD   Encounter Date: 10/10/2021   PT End of Session - 10/10/21 1203     Visit Number 1    Date for PT Re-Evaluation 01/02/22    Authorization Type BCBS    PT Start Time 1101    PT Stop Time 1150    PT Time Calculation (min) 49 min    Activity Tolerance Patient tolerated treatment well    Behavior During Therapy [facility] for tasks assessed/performed             Past Medical History:  Diagnosis Date   Arthritis    Cancer ([place]) 04/2010   melanoma-left knee, right arm   Chronic back pain    Complication of anesthesia    Ductal carcinoma in situ (DCIS) of right breast 10/2017   Family history of adverse reaction to anesthesia    mother has PONV   Family history of colon cancer    Family history of melanoma    Family history of ovarian cancer    Genetic testing 12/23/2017   STAT Breast panel with reflex to Multi-Cancer panel (83 genes) @ [facility] - No pathogenic mutations detected   GERD (gastroesophageal reflux disease)    H/O hiatal hernia    Heart murmur    no problems   History of hand surgery    Hypertension    PONV (postoperative nausea and vomiting)    "zofran works great   Seasonal allergies    Vertigo     Past Surgical History:  Procedure Laterality Date   ANTERIOR LAT LUMBAR FUSION Right 04/17/2013   Procedure: ANTERIOR LATERAL LUMBAR FUSION 3 LEVELS;  Surgeon: Erline Levine, MD;  Location: [place] [facility];  Service: Neurosurgery;  Laterality: Right;  Right Lumbar Two-Three Lumbar Three-Four Lumbar Four-Five  Anterolateral Fusion   BACK SURGERY     BREAST LUMPECTOMY WITH RADIOACTIVE SEED LOCALIZATION Right 12/25/2017   Procedure: RIGHT BREAST LUMPECTOMY WITH  RADIOACTIVE SEED LOCALIZATION;  Surgeon: Fanny Skates, MD;  Location: [place];  Service: General;  Laterality: Right;   [place]   COLONOSCOPY     COLONOSCOPY WITH PROPOFOL N/A 06/23/2018   Procedure: COLONOSCOPY WITH PROPOFOL;  Surgeon: Clarene Essex, MD;  Location: [facility];  Service: Endoscopy;  Laterality: N/A;   CYSTOSCOPY N/A 06/26/2016   Procedure: CYSTOSCOPY;  Surgeon: Megan Salon, MD;  Location: [facility];  Service: Gynecology;  Laterality: N/A;   DILATATION & CURETTAGE/HYSTEROSCOPY WITH MYOSURE N/A 01/24/2016   Procedure: DILATATION & CURETTAGE/HYSTEROSCOPY WITH MYOSURE;  Surgeon: Megan Salon, MD;  Location: [facility];  Service: Gynecology;  Laterality: N/A;   FOOT SURGERY Right    early 20s-bone spur removal   HAND SURGERY Bilateral    for basal joint arthritis   LAPAROSCOPIC HYSTERECTOMY Bilateral 06/26/2016   Procedure: HYSTERECTOMY TOTAL LAPAROSCOPIC;  Surgeon: Megan Salon, MD;  Location: [facility];  Service: Gynecology;  Laterality: Bilateral;   LUMBAR PERCUTANEOUS PEDICLE SCREW 3 LEVEL N/A 04/17/2013   Procedure: LUMBAR PERCUTANEOUS PEDICLE SCREW 3 LEVEL;  Surgeon: Erline Levine, MD;  Location: [place] [facility];  Service: Neurosurgery;  Laterality: N/A;  Decompression and Posterior Lumbar Interbody Fusion of  Lumbar Five-Sacral One, Percutaneous Screws Lumbar Two through Sacral One   MELANOMA EXCISION     knee 08 lft. elbow 11rt   SALPINGOOPHORECTOMY Bilateral 06/26/2016   Procedure: SALPINGO OOPHORECTOMY;  Surgeon: Megan Salon, MD;  Location: [facility];  Service: Gynecology;  Laterality: Bilateral;   SHOULDER SURGERY Right 04/28/2015   STRABISMUS SURGERY Bilateral 07/29/2020   Procedure: BILATERAL STRABISMUS REPAIR;  Surgeon: Everitt Amber, MD;  Location: [facility];  Service: Ophthalmology;  Laterality: Bilateral;   TUBAL LIGATION  1986   [facility]  06/17/2007   and left groin lymph node    XI ROBOTIC ASSISTED HIATAL HERNIA REPAIR N/A 04/04/2021   Procedure: ROBOTIC HIATAL HERNIA REPAIR WITH FUNDOPLICATION;  Surgeon: Ralene Ok, MD;  Location: [facility];  Service: General;  Laterality: N/A;    There were no vitals filed for this visit.    Subjective Assessment - 10/10/21 1056     Subjective Pt reports long history of fecal smearing that has recetly worsened. She is now taking metamucil in the morning. She believes that fecal smearing started after her lumbar fusion in 2014. She does feel like stomach is very sensitive and she has to avoid certain foods like sweets, possibly dairy. She does shakeology in the mornings daily. Not always clear patterns between diet and stomach upset, but fecal incontinence is worse when her stomach is upset. She also reports gas incontinence.    Pertinent History MD reports not emptying bowels completely; lumbar fusion (anterior) L2-5, breast cancer/lumpectomy Rt, LAP hysterectomy, hiatal hernia repair    Limitations Walking;Standing    How long can you sit comfortably? no limitations    How long can you stand comfortably? 10 minutes    How long can you walk comfortably? no limitations - 3-4 hours cuases some soreness    Patient Stated Goals reduce fecal smearing    Currently in Pain? Yes    Pain Score 2     Pain Location Back    Pain Orientation Left;Right    Pain Descriptors / Indicators Spasm    Pain Type Chronic pain    Pain Onset More than a month ago    Pain Frequency Intermittent    Aggravating Factors  standing    Pain Relieving Factors walking, sitting    Effect of Pain on Daily Activities limiting    Multiple Pain Sites No                [facility] PT Assessment - 10/10/21 0001       Assessment   Medical Diagnosis R15.1 (ICD-10-CM) - Fecal smearing    Referring Provider (PT) Megan Salon, MD    Onset Date/Surgical Date 09/24/12   pt unsure, but wondering if smearing started with lumbar fusion   Next MD Visit 11/22/21     Prior Therapy yes      Precautions   Precautions None      Restrictions   Weight Bearing Restrictions No      Balance Screen   Has the patient fallen in the past 6 months No    Has the patient had a decrease in activity level because of a fear of falling?  No    Is the patient reluctant to leave their home because of a fear of falling?  No      Home Social worker Private residence    Living Arrangements Spouse/significant other      Prior Function   Level of [place]  Vocation Retired      Charity fundraiser Status Within Functional Limits for tasks assessed      Functional Tests   Functional tests Single leg stance;Squat      Squat   Comments Bil valgus knee collapse, rt>lt      Single Leg Stance   Comments Unable Bil; trendelenburg present Bil      Posture/Postural Control   Posture Comments Scoliosis with R thoracic curve and remnants of L lumbar curve, elevated Lt iliac crest, elevated Rt shoulder; sig Bil LE external rotation      ROM / Strength   AROM / PROM / Strength AROM;Strength      AROM   Overall AROM Comments Lumbar: flexion reduced 50%, extension limited 80%, Bil side bend limited by 75%; no Lt rotation and Rt rotation limited by 75%; Rt hip IR 15 degress; unable to reach neutral Bil hip extension      Strength   Overall Strength Comments Bil hip flexion 4/5, B hip extension 2/5, B hip abd 2/5      Flexibility   Soft Tissue Assessment /Muscle Length yes   Hip flexors with significant restriction ( 90 degrees in prone)   Hamstrings reduced by 60% bil    Piriformis no reduction, but pt reported discomfort in posterolateral hip mm with testing bil      Palpation   Palpation comment sig tightness in bil lumbar/thoracic paraspinals with tenderness      Ambulation/Gait   Ambulation/Gait Yes    Ambulation/Gait Assistance --   Significant Bil LE external rotation, not achieving upright posture and remains in  Bil hip flexion (no hip extension present)                   No emotional/communication barriers or cognitive limitation. Patient is motivated to learn. Patient understands and agrees with treatment goals and plan. PT explains patient will be examined in standing, sitting, and lying down to see how their muscles and joints work. When they are ready, they will be asked to remove their underwear so PT can examine their perineum. The patient is also given the option of providing their own chaperone as one is not provided in our facility. The patient also has the right and is explained the right to defer or refuse any part of the evaluation or treatment including the internal exam. With the patient's consent, PT will use one gloved finger to gently assess the muscles of the pelvic floor, seeing how well it contracts and relaxes and if there is muscle symmetry. After, the patient will get dressed and PT and patient will discuss exam findings and plan of care. PT and patient discuss plan of care, schedule, attendance policy and HEP activities.     Objective measurements completed on examination: See above findings.     Pelvic Floor Special Questions - 10/10/21 0001     Prior Pelvic/Prostate Exam Yes    Are you Pregnant or attempting pregnancy? No    Prior Pregnancies Yes    Number of Pregnancies 3    Number of C-Sections 2    Number of Vaginal Deliveries 1    Any difficulty with labor and deliveries No    Episiotomy Performed No   tearing with sutures   Currently Sexually Active No    History of sexually transmitted disease No    Urinary Leakage Yes   started having epsiodes of complete void without warning - 2x in the middle of the  night   How often daily, small amounts    Pad use yes   1   Activities that cause leaking Coughing;Sneezing;Laughing;Walking;Lifting    Urinary urgency Yes    Urinary frequency every 2-3 hours - pt unsure    Fecal incontinence Yes   depends on diet;  once she has an peiosde, she will keep having smearing afterwards; she will have smearing after a normal BM; denies constipation currently, but history of; she has BM every day to every other day; no straining   Fluid intake 16oz bottle a day    Caffeine beverages yes    Falling out feeling (prolapse) No    External Perineal Exam posterior triangle only    Skin Integrity Intact;Hemorroids    Scar none    External Palpation WNL    Prolapse None   No rectal prolapse noted   Pelvic Floor Internal Exam Pt identity confirmed and verbal consent provided for internal pelvic exam    Exam Type Rectal    Sensation WNL    Palpation WNL    Strength weak squeeze, no lift    Strength # of reps 4    Strength # of seconds 12    Tone WNL              [facility] Adult PT Treatment/Exercise - 10/10/21 0001       Self-Care   Self-Care Other Self-Care Comments   Discussed timing of bowel movements attempts to after meals, squatty potty for more appropriate positioning, increasing fiber and water intake.                    PT Education - 10/10/21 1202     Education Details Pt education performed on squatty potty, timing of bowel movement attempts after meals, increasing fiber intake in foods as well as supplement, increasing water intake, and impact of exercise on bowel movements. Written handout provided from patient instructions.    Person(s) Educated Patient    Methods Explanation;Demonstration;Tactile cues;Verbal cues    Comprehension Verbalized understanding              PT Short Term Goals - 10/10/21 1217       PT SHORT TERM GOAL #1   Title 1. Pt will be independent with HEP in 2 weeks.    Time 2    Period Weeks    Status New    Target Date 10/24/21      PT SHORT TERM GOAL #2   Title Pt will increase thoracolumbar/pelvic moiblity by 25% in all directions in order increase functional mobility.    Time 4    Period Weeks    Status New    Target Date 11/07/21                PT Long Term Goals - 10/10/21 1219       PT LONG TERM GOAL #1   Title Pt will be independent with advanced HEP in 8 weeks.    Time 8    Period Weeks    Status New    Target Date 12/05/21      PT LONG TERM GOAL #2   Title Pt will demonstrate increased pelvic floor muscle strength to 4/5 in order to improve fecal/urinary incontinence.    Time 8    Period Weeks    Status New    Target Date 12/05/21      PT LONG TERM GOAL #3   Title Pt will report 75%  improvement in fecal/urinary incontinence in order to improve confidence with community activities.    Time 10    Period Weeks    Status New    Target Date 12/19/21      PT LONG TERM GOAL #4   Title Pt will increase all impaired hip strength by at least 1 muscle grade in order to appropriately support pelvic floor muscles and improve body mechanics with functional activities to appropriately activate pelvic floor muscles.    Time 10    Period Weeks    Status New    Target Date 12/19/21                    Plan - 10/10/21 1203     Clinical Impression Statement Pt is a 66 year old female with chief complaint of fecal smearing/incontinence for the last 9 years that has recently gotten worse and secondary complaints of urinary incontinence. Exam findings notable for abnormal posture including scoliosis/ER of Bil LE/Lt pelvis elevation, sig Bil hip weakness, sig decrease in Bil hip extension and Rt hip IR, decreased lumbar A/ROM in all directions, abnormal gait pattern, pelvic floor muscle weakness 2/5, decreased pelvic floor muscle endurance 12 seconds at 2/5 strength; will pla nto perform internal vaginal assessment next treatment session to provide more information. Signs and symptoms most consistent with abnormal posture, pelvic floor and core weakness and reduced endurance, and altered lumbopelvic mechanics. She tolerated initial treatment of lifestyle modifications well demonstrated by good teach back and  verbalized understanding. She will benefit from skilled PT intervention in order to address impairments, reduce fecal incontinence, improve urinary incontinence, and increase QOL.    Personal Factors and Comorbidities Comorbidity 1;Comorbidity 3+;Comorbidity 2    Comorbidities MD reports not emptying bowels completely; lumbar fusion (anterior) L2-5, breast cancer/lumpectomy Rt, LAP hysterectomy, hiatal hernia repair, 2 c-sections/1 vaginal delivery with tearing    Examination-Activity Limitations Bend;Continence;Locomotion Level;Stand    Examination-Participation Restrictions Community Activity    Stability/Clinical Decision Making Stable/Uncomplicated    Clinical Decision Making Low    Rehab Potential Good    PT Frequency 1x / week    PT Duration 12 weeks    PT Treatment/Interventions ADLs/Self Care Home Management;Biofeedback;Cryotherapy;Electrical Stimulation;Moist Heat;Therapeutic activities;Neuromuscular re-education;Therapeutic exercise;Patient/family education;Manual techniques;Passive range of motion;Dry needling;Spinal Manipulations    PT Next Visit Plan Perform internal vaginal exam; abdominal exam and begin treatment for any restrictions; consider beginning pelvic floor strengthening/coordination training; review appropriate bowel habits.    PT Home Exercise Plan See pt instructions.    Consulted and Agree with Plan of Care Patient             Patient will benefit from skilled therapeutic intervention in order to improve the following deficits and impairments:  Abnormal gait, Decreased balance, Decreased endurance, Decreased mobility, Increased muscle spasms, Hypomobility, Impaired tone, Decreased scar mobility, Decreased activity tolerance, Decreased coordination, Decreased strength, Increased fascial restricitons, Pain, Postural dysfunction  Visit Diagnosis: Abnormal posture  Muscle weakness (generalized)  Other muscle spasm  Unspecified lack of  coordination     Problem List Patient Active Problem List   Diagnosis Date Noted   Elevated LDL cholesterol level 08/22/2021   S/P repair of paraesophageal hernia 04/04/2021   Labyrinthitis 02/13/2021   Medication monitoring encounter 12/23/2017   Family history of ovarian cancer    Family history of colon cancer    Family history of melanoma    Ductal carcinoma in situ (DCIS) of right breast 11/22/2017   Hiatal hernia 12/30/2015  Malignant melanoma of lower leg ([place]) 03/10/2015   Melanoma of right upper arm ([place]) 10/12/2013   GERD (gastroesophageal reflux disease) 10/12/2013   Essential hypertension 10/12/2013    Heather Roberts, PT, DPT01/17/2312:25 PM   [facility] @ [address] [place], [place], [address] Phone: [phone]   Fax:  [phone]  Name: Melissa Mcclure MRN: [id number] Date of Birth: 02-17-1956

## 2021-10-12 NOTE — Progress Notes (Signed)
[facility] Note  10/16/2021     CHIEF COMPLAINT Patient presents for Retina Evaluation   HISTORY OF PRESENT ILLNESS: Melissa Mcclure is a 66 y.o. female who presents to the clinic today for:   HPI     Retina Evaluation   In left eye.  I, the attending physician,  performed the HPI with the patient and updated documentation appropriately.        Comments   Patient here for retina evaluation. Referred by Dr Lucianne Lei for retinoschisis OS. Patient states vision doing ok, no eye pain. OS has floaters. Has had a long time. Had muscle sx OS to keep eye from drifting to side. Done by Dr Annamaria Boots.       Last edited by Bernarda Caffey, MD on 10/17/2021  4:26 PM.    Pt is here on the referral of Dr. Lucianne Lei for concern of retinoschisis OS, she states no previous eye drs have told her about the schisis, pt states she has had bilateral strabismus repair with Dr. Annamaria Boots in November 2021 bc her eyes were was drifting towards her ear and making her off balance, she states she has floaters OS only, she endorses being hypertensive  Referring physician: Lisabeth Pick, MD [place],  [place] [address]  HISTORICAL INFORMATION:   Selected notes from the MEDICAL RECORD NUMBER Referred by Dr. Lucianne Lei for concern of retinoschisis OS LEE:  Ocular Hx- PMH-    CURRENT MEDICATIONS: No current outpatient medications on file. (Ophthalmic Drugs)   No current facility-administered medications for this visit. (Ophthalmic Drugs)   Current Outpatient Medications (Other)  Medication Sig   acetaminophen (TYLENOL) 500 MG tablet Take 1,000 mg by mouth 2 (two) times daily as needed for mild pain.   Ascorbic Acid (VITAMIN C WITH ROSE HIPS) 1000 MG tablet Take 1,000 mg by mouth daily.   atorvastatin (LIPITOR) 10 MG tablet Take 1 tablet (10 mg total) by mouth daily.   cetirizine (ZYRTEC) 10 MG tablet Take 10 mg by mouth daily.   losartan-hydrochlorothiazide (HYZAAR) 100-12.5 MG tablet  TAKE 1 TABLET BY MOUTH DAILY. REPORTED ON 01/23/2016   meclizine (ANTIVERT) 25 MG tablet Take 1 tablet (25 mg total) by mouth 3 (three) times daily as needed (vertigo). Reported on 01/23/2016   Multiple Vitamins-Minerals (ALIVE WOMENS 50+) TABS Take 1 tablet by mouth daily.   ondansetron (ZOFRAN-ODT) 8 MG disintegrating tablet Take 8 mg by mouth every 8 (eight) hours as needed for nausea or vomiting.   polyethylene glycol (MIRALAX / GLYCOLAX) packet Take 17 g by mouth daily as needed for moderate constipation.   Probiotic Product (ALIGN) 4 MG CAPS Take 4 mg by mouth at bedtime.   raloxifene (EVISTA) 60 MG tablet TAKE 1 TABLET BY MOUTH EVERY DAY (Patient taking differently: Take 60 mg by mouth daily.)   traMADol (ULTRAM) 50 MG tablet Take 50 mg by mouth every 6 (six) hours as needed (for pain).   Calcium-Vitamin D-Vitamin K (VIACTIV CALCIUM PLUS D PO) Take 1 tablet by mouth daily.   No current facility-administered medications for this visit. (Other)   REVIEW OF SYSTEMS: ROS   Positive for: Cardiovascular, Eyes Negative for: Constitutional, Gastrointestinal, Neurological, Skin, Genitourinary, Musculoskeletal, HENT, Endocrine, Respiratory, Psychiatric, Allergic/Imm, Heme/Lymph Last edited by Leonie Douglas, COA on 10/16/2021  1:41 PM.     ALLERGIES Allergies  Allergen Reactions   Elemental Sulfur Hives    PAST MEDICAL HISTORY Past Medical History:  Diagnosis Date   Arthritis  Cancer ([place]) 04/2010   melanoma-left knee, right arm   Chronic back pain    Complication of anesthesia    Ductal carcinoma in situ (DCIS) of right breast 10/2017   Family history of adverse reaction to anesthesia    mother has PONV   Family history of colon cancer    Family history of melanoma    Family history of ovarian cancer    Genetic testing 12/23/2017   STAT Breast panel with reflex to Multi-Cancer panel (83 genes) @ [facility] - No pathogenic mutations detected   GERD (gastroesophageal reflux disease)     H/O hiatal hernia    Heart murmur    no problems   History of hand surgery    Hypertension    PONV (postoperative nausea and vomiting)    "zofran works great   Seasonal allergies    Vertigo    Past Surgical History:  Procedure Laterality Date   ANTERIOR LAT LUMBAR FUSION Right 04/17/2013   Procedure: ANTERIOR LATERAL LUMBAR FUSION 3 LEVELS;  Surgeon: Erline Levine, MD;  Location: [facility];  Service: Neurosurgery;  Laterality: Right;  Right Lumbar Two-Three Lumbar Three-Four Lumbar Four-Five  Anterolateral Fusion   BACK SURGERY     BREAST LUMPECTOMY WITH RADIOACTIVE SEED LOCALIZATION Right 12/25/2017   Procedure: RIGHT BREAST LUMPECTOMY WITH RADIOACTIVE SEED LOCALIZATION;  Surgeon: Fanny Skates, MD;  Location: [place];  Service: General;  Laterality: Right;   [place]   COLONOSCOPY     COLONOSCOPY WITH PROPOFOL N/A 06/23/2018   Procedure: COLONOSCOPY WITH PROPOFOL;  Surgeon: Clarene Essex, MD;  Location: [facility];  Service: Endoscopy;  Laterality: N/A;   CYSTOSCOPY N/A 06/26/2016   Procedure: CYSTOSCOPY;  Surgeon: Megan Salon, MD;  Location: [facility];  Service: Gynecology;  Laterality: N/A;   DILATATION & CURETTAGE/HYSTEROSCOPY WITH MYOSURE N/A 01/24/2016   Procedure: DILATATION & CURETTAGE/HYSTEROSCOPY WITH MYOSURE;  Surgeon: Megan Salon, MD;  Location: [facility];  Service: Gynecology;  Laterality: N/A;   FOOT SURGERY Right    early 20s-bone spur removal   HAND SURGERY Bilateral    for basal joint arthritis   LAPAROSCOPIC HYSTERECTOMY Bilateral 06/26/2016   Procedure: HYSTERECTOMY TOTAL LAPAROSCOPIC;  Surgeon: Megan Salon, MD;  Location: [facility];  Service: Gynecology;  Laterality: Bilateral;   LUMBAR PERCUTANEOUS PEDICLE SCREW 3 LEVEL N/A 04/17/2013   Procedure: LUMBAR PERCUTANEOUS PEDICLE SCREW 3 LEVEL;  Surgeon: Erline Levine, MD;  Location: [facility];  Service: Neurosurgery;  Laterality: N/A;   Decompression and Posterior Lumbar Interbody Fusion of Lumbar Five-Sacral One, Percutaneous Screws Lumbar Two through Sacral One   MELANOMA EXCISION     knee 08 lft. elbow 11rt   SALPINGOOPHORECTOMY Bilateral 06/26/2016   Procedure: SALPINGO OOPHORECTOMY;  Surgeon: Megan Salon, MD;  Location: [facility];  Service: Gynecology;  Laterality: Bilateral;   SHOULDER SURGERY Right 04/28/2015   STRABISMUS SURGERY Bilateral 07/29/2020   Procedure: BILATERAL STRABISMUS REPAIR;  Surgeon: Everitt Amber, MD;  Location: [facility];  Service: Ophthalmology;  Laterality: Bilateral;   TUBAL LIGATION  1986   [facility]  06/17/2007   and left groin lymph node   XI ROBOTIC ASSISTED HIATAL HERNIA REPAIR N/A 04/04/2021   Procedure: ROBOTIC HIATAL HERNIA REPAIR WITH FUNDOPLICATION;  Surgeon: Ralene Ok, MD;  Location: [facility];  Service: General;  Laterality: N/A;   FAMILY HISTORY Family History  Problem Relation Age of Onset  Diabetes Father        borderline   Melanoma Father        dx 81s/60s; deceased 39s   Congestive Heart Failure Father    Colon cancer Mother        dx 22s; deceased 18   Hypertension Mother    Osteoporosis Mother    Ovarian cancer Maternal Grandmother        deceased 35s   Heart disease Paternal Grandfather    Melanoma Daughter        on leg; dx 70s   SOCIAL HISTORY Social History   Tobacco Use   Smoking status: Never   Smokeless tobacco: Never  Vaping Use   Vaping Use: Never used  Substance Use Topics   Alcohol use: Yes    Alcohol/week: 0.0 - 1.0 standard drinks   Drug use: No       OPHTHALMIC EXAM:  Base Eye Exam     Visual Acuity (Snellen - Linear)       Right Left   Dist [place] 20/25 -2 20/20   Dist ph [place] 20/20          Tonometry (Tonopen, 1:31 PM)       Right Left   Pressure 11 10         Pupils       Dark Light Shape React APD   Right 3 2 Round Brisk None   Left 3 2 Round Brisk None         Visual Fields (Counting fingers)        Left Right    Full Full         Extraocular Movement       Right Left    Full, Ortho Full, Ortho         Neuro/Psych     Oriented x3: Yes   Mood/Affect: Normal         Dilation     Both eyes: 1.0% Mydriacyl, 2.5% Phenylephrine @ 1:30 PM           Slit Lamp and Fundus Exam     Slit Lamp Exam       Right Left   Lids/Lashes Dermatochalasis - upper lid Dermatochalasis - upper lid   Conjunctiva/Sclera White and quiet Mild nasal and temporal pinguecula   Cornea 1+ Punctate epithelial erosions, mild tear film debris 1+ Punctate epithelial erosions, mild tear film debris   Anterior Chamber Deep and quiet deep, clear, narrow temporal angle   Iris Round and dilated Round and dilated   Lens 2+ Nuclear sclerosis, 2+ Cortical cataract, pigment deposition on inferior capsule 2+ Nuclear sclerosis, 2+ Cortical cataract, pigment deposition on inferior capsule   Anterior Vitreous Vitreous syneresis Vitreous syneresis         Fundus Exam       Right Left   Disc Pink and Sharp, +cupping, mild temporal PPA Pink and Sharp, +cupping, mild temporal PPA   C/D Ratio 0.65 0.65   Macula Flat, Good foveal reflex, mild RPE mottling, No heme or edema Flat, Good foveal reflex, mild RPE mottling, No heme or edema   Vessels mild attenuation mild attenuation   Periphery Attached, focal CR atrophy at 0900, ?shallow schisis ST periphery, no RT/RD Attached, schisis from 0100-0300, no RT/RD on scleral depression           Refraction     Manifest Refraction       Sphere Cylinder Axis Dist VA   Right +0.50 +1.00 015  20/20   Left +0.50 +0.50 090 20/20            IMAGING AND PROCEDURES  Imaging and Procedures for 10/16/2021  OCT, Retina - OU - Both Eyes       Right Eye Quality was good. Central Foveal Thickness: 320. Progression has no prior data. Findings include normal foveal contour, no IRF, no SRF (Partial PVD).   Left Eye Quality was good. Central Foveal  Thickness: 348. Progression has no prior data. Findings include normal foveal contour, no IRF, no SRF (Bullous retinoschisis from 0100-0230 caught on widefield).   Notes *Images captured and stored on drive  Diagnosis / Impression:  OD: NFP, no IRF/SRF OS: Bullous retinoschisis from 0100-0230 caught on widefield  Clinical management:  See below  Abbreviations: NFP - Normal foveal profile. CME - cystoid macular edema. PED - pigment epithelial detachment. IRF - intraretinal fluid. SRF - subretinal fluid. EZ - ellipsoid zone. ERM - epiretinal membrane. ORA - outer retinal atrophy. ORT - outer retinal tubulation. SRHM - subretinal hyper-reflective material. IRHM - intraretinal hyper-reflective material            ASSESSMENT/PLAN:    ICD-10-CM   1. Bilateral retinoschisis  H33.103 OCT, Retina - OU - Both Eyes    2. Essential hypertension  I10     3. Hypertensive retinopathy of both eyes  H35.033     4. History of strabismus surgery  Z98.890     5. Combined forms of age-related cataract of both eyes  H25.813      1. Retinoschisis OU  - OD: ?shallow schisis ST periphery - OS: bullous schisis located from 0100-0200 - no RT/RD OU - discussed findings, prognosis, treatment options - no retinal or ophthalmic interventions indicated or recommended at this time - recommend monitoring - f/u 4 weeks, DFE, OCT -- scan through schisis  2,3. Hypertensive retinopathy OU - discussed importance of tight BP control - monitor  4. History of strabismus surgery  - pt reports history of XT  - s/p strabismus sx w/ Dr. Annamaria Boots 11.5.21  5. Mixed Cataract OU - The symptoms of cataract, surgical options, and treatments and risks were discussed with patient. - discussed diagnosis and progression - not yet visually significant - monitor for now   Ophthalmic Meds Ordered this visit:  No orders of the defined types were placed in this encounter.    Return in about 4 weeks (around 11/13/2021)  for f/u retinoschisis OU.  There are no Patient Instructions on file for this visit.   Explained the diagnoses, plan, and follow up with the patient and they expressed understanding.  Patient expressed understanding of the importance of proper follow up care.   This document serves as a record of services personally performed by Gardiner Sleeper, MD, PhD. It was created on their behalf by Roselee Nova, COMT. The creation of this record is the provider's dictation and/or activities during the visit.  Electronically signed by: Roselee Nova, COMT 10/17/21 4:32 PM  This document serves as a record of services personally performed by Gardiner Sleeper, MD, PhD. It was created on their behalf by San Jetty. Owens Shark, OA an ophthalmic technician. The creation of this record is the provider's dictation and/or activities during the visit.    Electronically signed by: San Jetty. Owens Shark, [place] 01.23.2023 4:32 PM  Gardiner Sleeper, M.D., Ph.D. Diseases & Surgery of the Retina and Vitreous [facility]  I have reviewed the above documentation for accuracy  and completeness, and I agree with the above. Gardiner Sleeper, M.D., Ph.D. 10/17/21 4:32 PM   Abbreviations: M myopia (nearsighted); A astigmatism; H hyperopia (farsighted); P presbyopia; Mrx spectacle prescription;  CTL contact lenses; OD right eye; OS left eye; OU both eyes  XT exotropia; ET esotropia; PEK punctate epithelial keratitis; PEE punctate epithelial erosions; DES dry eye syndrome; MGD meibomian gland dysfunction; ATs artificial tears; PFAT's preservative free artificial tears; [place] nuclear sclerotic cataract; PSC posterior subcapsular cataract; ERM epi-retinal membrane; PVD posterior vitreous detachment; RD retinal detachment; DM diabetes mellitus; DR diabetic retinopathy; NPDR non-proliferative diabetic retinopathy; PDR proliferative diabetic retinopathy; CSME clinically significant macular edema; DME diabetic macular edema; dbh dot  blot hemorrhages; CWS cotton wool spot; POAG primary open angle glaucoma; C/D cup-to-disc ratio; HVF humphrey visual field; GVF goldmann visual field; OCT optical coherence tomography; IOP intraocular pressure; BRVO Branch retinal vein occlusion; CRVO central retinal vein occlusion; CRAO central retinal artery occlusion; BRAO branch retinal artery occlusion; RT retinal tear; SB scleral buckle; PPV pars plana vitrectomy; VH Vitreous hemorrhage; PRP panretinal laser photocoagulation; IVK intravitreal kenalog; VMT vitreomacular traction; MH Macular hole;  NVD neovascularization of the disc; NVE neovascularization elsewhere; AREDS age related eye disease study; ARMD age related macular degeneration; POAG primary open angle glaucoma; EBMD epithelial/anterior basement membrane dystrophy; ACIOL anterior chamber intraocular lens; IOL intraocular lens; PCIOL posterior chamber intraocular lens; Phaco/IOL phacoemulsification with intraocular lens placement; [place] photorefractive keratectomy; LASIK laser assisted in situ keratomileusis; HTN hypertension; DM diabetes mellitus; COPD chronic obstructive pulmonary disease

## 2021-10-13 ENCOUNTER — Other Ambulatory Visit: Payer: Self-pay

## 2021-10-13 ENCOUNTER — Emergency Department (HOSPITAL_BASED_OUTPATIENT_CLINIC_OR_DEPARTMENT_OTHER)
Admission: EM | Admit: 2021-10-13 | Discharge: 2021-10-14 | Disposition: A | Discharge status: Home / Self Care | Payer: BC Managed Care – PPO | Attending: Emergency Medicine | Admitting: Emergency Medicine

## 2021-10-13 ENCOUNTER — Emergency Department (HOSPITAL_BASED_OUTPATIENT_CLINIC_OR_DEPARTMENT_OTHER): Payer: BC Managed Care – PPO

## 2021-10-13 ENCOUNTER — Encounter (HOSPITAL_BASED_OUTPATIENT_CLINIC_OR_DEPARTMENT_OTHER): Payer: Self-pay | Admitting: *Deleted

## 2021-10-13 DIAGNOSIS — M62838 Other muscle spasm: Secondary | ICD-10-CM | POA: Diagnosis not present

## 2021-10-13 DIAGNOSIS — M549 Dorsalgia, unspecified: Secondary | ICD-10-CM | POA: Insufficient documentation

## 2021-10-13 DIAGNOSIS — M546 Pain in thoracic spine: Secondary | ICD-10-CM | POA: Diagnosis not present

## 2021-10-13 DIAGNOSIS — R079 Chest pain, unspecified: Secondary | ICD-10-CM

## 2021-10-13 DIAGNOSIS — M542 Cervicalgia: Secondary | ICD-10-CM | POA: Diagnosis not present

## 2021-10-13 DIAGNOSIS — I10 Essential (primary) hypertension: Secondary | ICD-10-CM | POA: Diagnosis not present

## 2021-10-13 DIAGNOSIS — M25512 Pain in left shoulder: Secondary | ICD-10-CM | POA: Insufficient documentation

## 2021-10-13 DIAGNOSIS — K449 Diaphragmatic hernia without obstruction or gangrene: Secondary | ICD-10-CM | POA: Diagnosis not present

## 2021-10-13 LAB — CBC WITH DIFFERENTIAL/PLATELET
Abs Immature Granulocytes: 0.01 10*3/uL (ref 0.00–0.07)
Basophils Absolute: 0.1 10*3/uL (ref 0.0–0.1)
Basophils Relative: 1 %
Eosinophils Absolute: 0.2 10*3/uL (ref 0.0–0.5)
Eosinophils Relative: 3 %
HCT: 38.3 % (ref 36.0–46.0)
Hemoglobin: 13.5 g/dL (ref 12.0–15.0)
Immature Granulocytes: 0 %
Lymphocytes Relative: 38 %
Lymphs Abs: 1.9 10*3/uL (ref 0.7–4.0)
MCH: 32.1 pg (ref 26.0–34.0)
MCHC: 35.2 g/dL (ref 30.0–36.0)
MCV: 91.2 fL (ref 80.0–100.0)
Monocytes Absolute: 0.4 10*3/uL (ref 0.1–1.0)
Monocytes Relative: 8 %
Neutro Abs: 2.5 10*3/uL (ref 1.7–7.7)
Neutrophils Relative %: 50 %
Platelets: 233 10*3/uL (ref 150–400)
RBC: 4.2 MIL/uL (ref 3.87–5.11)
RDW: 12.7 % (ref 11.5–15.5)
WBC: 5 10*3/uL (ref 4.0–10.5)
nRBC: 0 % (ref 0.0–0.2)

## 2021-10-13 LAB — URINALYSIS, ROUTINE W REFLEX MICROSCOPIC
Bilirubin Urine: NEGATIVE
Glucose, UA: NEGATIVE mg/dL
Hgb urine dipstick: NEGATIVE
Ketones, ur: NEGATIVE mg/dL
Leukocytes,Ua: NEGATIVE
Nitrite: NEGATIVE
Protein, ur: NEGATIVE mg/dL
Specific Gravity, Urine: 1.015 (ref 1.005–1.030)
pH: 6 (ref 5.0–8.0)

## 2021-10-13 LAB — COMPREHENSIVE METABOLIC PANEL
ALT: 26 U/L (ref 0–44)
AST: 29 U/L (ref 15–41)
Albumin: 4.4 g/dL (ref 3.5–5.0)
Alkaline Phosphatase: 59 U/L (ref 38–126)
Anion gap: 8 (ref 5–15)
BUN: 23 mg/dL (ref 8–23)
CO2: 29 mmol/L (ref 22–32)
Calcium: 9.6 mg/dL (ref 8.9–10.3)
Chloride: 102 mmol/L (ref 98–111)
Creatinine, Ser: 0.72 mg/dL (ref 0.44–1.00)
GFR, Estimated: >60 mL/min (ref >60)
Glucose, Bld: 98 mg/dL (ref 70–99)
Potassium: 3.5 mmol/L (ref 3.5–5.1)
Sodium: 139 mmol/L (ref 135–145)
Total Bilirubin: 0.5 mg/dL (ref 0.3–1.2)
Total Protein: 7 g/dL (ref 6.5–8.1)

## 2021-10-13 LAB — D-DIMER, QUANTITATIVE: D-Dimer, Quant: 0.27 ug/mL-FEU (ref 0.00–0.50)

## 2021-10-13 LAB — TROPONIN I (HIGH SENSITIVITY)
Troponin I (High Sensitivity): 4 ng/L (ref <18)
Troponin I (High Sensitivity): 4 ng/L (ref <18)

## 2021-10-13 NOTE — ED Provider Notes (Signed)
Melissa Mcclure Provider Note   CSN: [id number] Arrival date & time: 10/13/21  1931     History  Chief Complaint  Patient presents with   Back Pain    L scapula    Melissa Mcclure is a 66 y.o. female.  Presents to the emergency room with concern for left scapula pain.  Pain is minimal going on for about a week.  Left shoulder to left scapula.  Feels like muscle spasm at times.  Seems to have occurred after she had gotten acupuncture.  Went to the orthopedic office today and was told that she should go to ER for evaluation of cardiopulmonary cause.  Currently pain is mild to moderate.  Worse with certain movements and improved with rest.  Not associated with exertion.  Aching sensation.  No chest pain.  She denies any prior history of heart disease.  HPI     Home Medications Prior to Admission medications   Medication Sig Start Date End Date Taking? Authorizing Provider  acetaminophen (TYLENOL) 500 MG tablet Take 1,000 mg by mouth 2 (two) times daily as needed for mild pain.    [provider]  Ascorbic Acid (VITAMIN C WITH ROSE HIPS) 1000 MG tablet Take 1,000 mg by mouth daily.    [provider]  atorvastatin (LIPITOR) 10 MG tablet Take 1 tablet (10 mg total) by mouth daily. 08/22/21   Libby Maw, MD  Calcium-Vitamin D-Vitamin K (VIACTIV CALCIUM PLUS D PO) Take 1 tablet by mouth daily.    [provider]  cetirizine (ZYRTEC) 10 MG tablet Take 10 mg by mouth daily.    [provider]  losartan-hydrochlorothiazide (HYZAAR) 100-12.5 MG tablet TAKE 1 TABLET BY MOUTH DAILY. REPORTED ON 01/23/2016 08/24/21   Libby Maw, MD  meclizine (ANTIVERT) 25 MG tablet Take 1 tablet (25 mg total) by mouth 3 (three) times daily as needed (vertigo). Reported on 01/23/2016 02/13/21   Libby Maw, MD  Multiple Vitamins-Minerals (ALIVE WOMENS 50+) TABS Take 1 tablet by mouth daily.    [provider]   ondansetron (ZOFRAN-ODT) 8 MG disintegrating tablet Take 8 mg by mouth every 8 (eight) hours as needed for nausea or vomiting.    [provider]  polyethylene glycol (MIRALAX / GLYCOLAX) packet Take 17 g by mouth daily as needed for moderate constipation.    [provider]  Probiotic Product (ALIGN) 4 MG CAPS Take 4 mg by mouth at bedtime.    [provider]  raloxifene (EVISTA) 60 MG tablet TAKE 1 TABLET BY MOUTH EVERY DAY Patient taking differently: Take 60 mg by mouth daily. 01/27/21   Truitt Merle, MD  traMADol (ULTRAM) 50 MG tablet Take 50 mg by mouth every 6 (six) hours as needed (for pain). 12/28/16   [provider]      Allergies    Elemental sulfur    Review of Systems   Review of Systems  Constitutional:  Negative for chills and fever.  HENT:  Negative for ear pain and sore throat.   Eyes:  Negative for pain and visual disturbance.  Respiratory:  Negative for cough and shortness of breath.   Cardiovascular:  Negative for chest pain and palpitations.  Gastrointestinal:  Negative for abdominal pain and vomiting.  Genitourinary:  Negative for dysuria and hematuria.  Musculoskeletal:  Positive for arthralgias. Negative for back pain.  Skin:  Negative for color change and rash.  Neurological:  Negative for seizures and syncope.  All  other systems reviewed and are negative.  Physical Exam Updated Vital Signs BP 121/82    Pulse 69    Temp 98.2 F (36.8 C) (Oral)    Resp 16    Ht 5\' 2"  (1.575 m)    Wt 63.4 kg    LMP 06/24/2008    SpO2 98%    BMI 25.56 kg/m  Physical Exam Vitals and nursing note reviewed.  Constitutional:      General: She is not in acute distress.    Appearance: She is well-developed.  HENT:     Head: Normocephalic and atraumatic.  Eyes:     Conjunctiva/sclera: Conjunctivae normal.  Cardiovascular:     Rate and Rhythm: Normal rate and regular rhythm.     Heart sounds: No murmur heard. Pulmonary:     Effort: Pulmonary  effort is normal. No respiratory distress.     Breath sounds: Normal breath sounds.  Abdominal:     Palpations: Abdomen is soft.     Tenderness: There is no abdominal tenderness.  Musculoskeletal:     Cervical back: Neck supple.     Comments: There is mild tenderness to the left shoulder, left lateral neck muscles, normal joint range of motion, no swelling noted  Skin:    General: Skin is warm and dry.     Capillary Refill: Capillary refill takes less than 2 seconds.  Neurological:     Mental Status: She is alert.  Psychiatric:        Mood and Affect: Mood normal.    ED Results / Procedures / Treatments   Labs (all labs ordered are listed, but only abnormal results are displayed) Labs Reviewed  D-DIMER, QUANTITATIVE  CBC WITH DIFFERENTIAL/PLATELET  COMPREHENSIVE METABOLIC PANEL  URINALYSIS, ROUTINE W REFLEX MICROSCOPIC  TROPONIN I (HIGH SENSITIVITY)  TROPONIN I (HIGH SENSITIVITY)    EKG EKG Interpretation  Date/Time:  Friday October 13 2021 19:54:06 EST Ventricular Rate:  75 PR Interval:  156 QRS Duration: 74 QT Interval:  384 QTC Calculation: 428 R Axis:   2 Text Interpretation: Sinus rhythm with Premature supraventricular complexes and with occasional Premature ventricular complexes Otherwise normal ECG When compared with ECG of 04-Apr-2021 09:55, PREVIOUS ECG IS PRESENT Confirmed by Madalyn Rob [phone]) on 10/13/2021 11:32:19 PM  Radiology DG Chest 2 View  Result Date: 10/14/2021 CLINICAL DATA:  Left upper back spasms versus scapula pain x1 week. EXAM: CHEST - 2 VIEW COMPARISON:  April 04, 2021 FINDINGS: The heart size and mediastinal contours are within normal limits. Both lungs are clear. A small to moderate sized hiatal hernia is present. There is marked severity dextroscoliosis of the thoracic spine with postoperative changes seen within the visualized portion of the lumbar spine. IMPRESSION: No active cardiopulmonary disease. Electronically Signed   By: Virgina Norfolk M.D.   On: 10/14/2021 00:06    Procedures Procedures    Medications Ordered in ED Medications - No data to display  ED Course/ Medical Decision Making/ A&P                           Medical Decision Making Amount and/or Complexity of Data Reviewed Labs: ordered. Radiology: ordered.   66 year old lady presenting to ER with concern for left shoulder/scapula pain.  Occurring since recent acupuncture procedure.  Seen by Ortho earlier in the day but sent to ER to rule out cardiopulmonary etiology.  On physical exam patient is well-appearing in no distress.  No obvious deformity to  her shoulder or neck or scapula.  EKG without acute ischemic change, doubt ACS.  Basic labs are grossly stable, no anemia or electrolyte derangement.  Will check chest x-ray to rule out pneumonia or pneumothorax.  Ultimately if this is negative, suspect most likely MSK in etiology.  While awaiting CXR, signed out to Dr. Wyvonnia Dusky.        Final Clinical Impression(s) / ED Diagnoses Final diagnoses:  Acute back pain, unspecified back location, unspecified back pain laterality    Rx / DC Orders ED Discharge Orders     None         Lucrezia Starch, MD 10/15/21 [id number]

## 2021-10-13 NOTE — ED Triage Notes (Signed)
Left scapula pain for a week. No blistery rash noted. States it feels like a muscle spasm.

## 2021-10-13 NOTE — Discharge Instructions (Signed)
Please follow-up with your primary care doctor to discuss your symptoms from today.  Recommend taking the medicine that was prescribed by her orthopedist for your symptoms.  Come back to ER if you develop worsening chest pain, difficulty breathing or other concerning symptom.

## 2021-10-14 NOTE — ED Notes (Signed)
Patient discharged to home.  All discharge instructions reviewed.  Patient verbalized understanding via teachback method.  VS WDL.  Respirations even and unlabored.  Ambulatory out of ED.   °

## 2021-10-16 ENCOUNTER — Encounter (INDEPENDENT_AMBULATORY_CARE_PROVIDER_SITE_OTHER): Payer: Self-pay | Admitting: Ophthalmology

## 2021-10-16 ENCOUNTER — Ambulatory Visit (INDEPENDENT_AMBULATORY_CARE_PROVIDER_SITE_OTHER): Payer: BC Managed Care – PPO | Admitting: Ophthalmology

## 2021-10-16 ENCOUNTER — Other Ambulatory Visit: Payer: Self-pay

## 2021-10-16 DIAGNOSIS — H35033 Hypertensive retinopathy, bilateral: Secondary | ICD-10-CM | POA: Diagnosis not present

## 2021-10-16 DIAGNOSIS — I10 Essential (primary) hypertension: Secondary | ICD-10-CM | POA: Diagnosis not present

## 2021-10-16 DIAGNOSIS — H33103 Unspecified retinoschisis, bilateral: Secondary | ICD-10-CM

## 2021-10-16 DIAGNOSIS — H25813 Combined forms of age-related cataract, bilateral: Secondary | ICD-10-CM

## 2021-10-16 DIAGNOSIS — Z9889 Other specified postprocedural states: Secondary | ICD-10-CM

## 2021-10-17 ENCOUNTER — Encounter (INDEPENDENT_AMBULATORY_CARE_PROVIDER_SITE_OTHER): Payer: Self-pay | Admitting: Ophthalmology

## 2021-10-18 ENCOUNTER — Ambulatory Visit: Payer: BC Managed Care – PPO

## 2021-10-19 ENCOUNTER — Other Ambulatory Visit: Payer: Self-pay

## 2021-10-19 ENCOUNTER — Ambulatory Visit: Payer: BC Managed Care – PPO

## 2021-10-19 DIAGNOSIS — M62838 Other muscle spasm: Secondary | ICD-10-CM | POA: Diagnosis not present

## 2021-10-19 DIAGNOSIS — M6281 Muscle weakness (generalized): Secondary | ICD-10-CM

## 2021-10-19 DIAGNOSIS — R293 Abnormal posture: Secondary | ICD-10-CM | POA: Diagnosis not present

## 2021-10-19 DIAGNOSIS — R151 Fecal smearing: Secondary | ICD-10-CM | POA: Diagnosis not present

## 2021-10-19 DIAGNOSIS — R279 Unspecified lack of coordination: Secondary | ICD-10-CM | POA: Diagnosis not present

## 2021-10-19 NOTE — Therapy (Addendum)
[facility] @ [address] [place], [place], [address] Phone: [phone]   Fax:  [phone]  Physical Therapy Treatment  Patient Details  Name: Melissa Mcclure MRN: [id number] Date of Birth: June 06, 1956 Referring Provider (PT): Megan Salon, MD   Encounter Date: 10/19/2021   PT End of Session - 10/19/21 1026     Visit Number 2    Date for PT Re-Evaluation 01/02/22    Authorization Type BCBS    PT Start Time 1696    PT Stop Time 1058    PT Time Calculation (min) 43 min    Activity Tolerance Patient tolerated treatment well    Behavior During Therapy [facility] for tasks assessed/performed             Past Medical History:  Diagnosis Date   Arthritis    Cancer ([place]) 04/2010   melanoma-left knee, right arm   Chronic back pain    Complication of anesthesia    Ductal carcinoma in situ (DCIS) of right breast 10/2017   Family history of adverse reaction to anesthesia    mother has PONV   Family history of colon cancer    Family history of melanoma    Family history of ovarian cancer    Genetic testing 12/23/2017   STAT Breast panel with reflex to Multi-Cancer panel (83 genes) @ [facility] - No pathogenic mutations detected   GERD (gastroesophageal reflux disease)    H/O hiatal hernia    Heart murmur    no problems   History of hand surgery    Hypertension    PONV (postoperative nausea and vomiting)    "zofran works great   Seasonal allergies    Vertigo     Past Surgical History:  Procedure Laterality Date   ANTERIOR LAT LUMBAR FUSION Right 04/17/2013   Procedure: ANTERIOR LATERAL LUMBAR FUSION 3 LEVELS;  Surgeon: Erline Levine, MD;  Location: [facility];  Service: Neurosurgery;  Laterality: Right;  Right Lumbar Two-Three Lumbar Three-Four Lumbar Four-Five  Anterolateral Fusion   BACK SURGERY     BREAST LUMPECTOMY WITH RADIOACTIVE SEED LOCALIZATION Right 12/25/2017   Procedure: RIGHT BREAST LUMPECTOMY WITH  RADIOACTIVE SEED LOCALIZATION;  Surgeon: Fanny Skates, MD;  Location: [facility];  Service: General;  Laterality: Right;   [place]   COLONOSCOPY     COLONOSCOPY WITH PROPOFOL N/A 06/23/2018   Procedure: COLONOSCOPY WITH PROPOFOL;  Surgeon: Clarene Essex, MD;  [facility];  Service: Endoscopy;  Laterality: N/A;   CYSTOSCOPY N/A 06/26/2016   Procedure: CYSTOSCOPY;  Surgeon: Megan Salon, MD;  Location: [facility];  Service: Gynecology;  Laterality: N/A;   DILATATION & CURETTAGE/HYSTEROSCOPY WITH MYOSURE N/A 01/24/2016   Procedure: DILATATION & CURETTAGE/HYSTEROSCOPY WITH MYOSURE;  Surgeon: Megan Salon, MD;  Location: [facility];  Service: Gynecology;  Laterality: N/A;   FOOT SURGERY Right    early 20s-bone spur removal   HAND SURGERY Bilateral    for basal joint arthritis   LAPAROSCOPIC HYSTERECTOMY Bilateral 06/26/2016   Procedure: HYSTERECTOMY TOTAL LAPAROSCOPIC;  Surgeon: Megan Salon, MD;  Location: [facility];  Service: Gynecology;  Laterality: Bilateral;   LUMBAR PERCUTANEOUS PEDICLE SCREW 3 LEVEL N/A 04/17/2013   Procedure: LUMBAR PERCUTANEOUS PEDICLE SCREW 3 LEVEL;  Surgeon: Erline Levine, MD;  Location: [facility];  Service: Neurosurgery;  Laterality: N/A;  Decompression and Posterior Lumbar Interbody Fusion of  Lumbar Five-Sacral One, Percutaneous Screws Lumbar Two through Sacral One   MELANOMA EXCISION     knee 08 lft. elbow 11rt   SALPINGOOPHORECTOMY Bilateral 06/26/2016   Procedure: SALPINGO OOPHORECTOMY;  Surgeon: Megan Salon, MD;  Location: [facility];  Service: Gynecology;  Laterality: Bilateral;   SHOULDER SURGERY Right 04/28/2015   STRABISMUS SURGERY Bilateral 07/29/2020   Procedure: BILATERAL STRABISMUS REPAIR;  Surgeon: Everitt Amber, MD;  Location: [facility];  Service: Ophthalmology;  Laterality: Bilateral;   TUBAL LIGATION  1986   WLE  06/17/2007   and left groin lymph node    XI ROBOTIC ASSISTED HIATAL HERNIA REPAIR N/A 04/04/2021   Procedure: ROBOTIC HIATAL HERNIA REPAIR WITH FUNDOPLICATION;  Surgeon: Ralene Ok, MD;  Location: [facility];  Service: General;  Laterality: N/A;    There were no vitals filed for this visit.   Subjective Assessment - 10/19/21 1015     Subjective Pt states that she tried a stool during bowel movements but she thinks it's too high and needs to order a squatty potty. She has increased metamucil to 2x/day and is working on eating more fibrous foods. She is working on Designer, fashion/clothing intake, espeically with increased fiber intake. She reports noticing some improvement with fecal smearing, but it is still happening. She does report diffiuclty with having excess gas.    Patient Stated Goals reduce fecal smearing    Currently in Pain? No/denies                     No emotional/communication barriers or cognitive limitation. Patient is motivated to learn. Patient understands and agrees with treatment goals and plan. PT explains patient will be examined in standing, sitting, and lying down to see how their muscles and joints work. When they are ready, they will be asked to remove their underwear so PT can examine their perineum. The patient is also given the option of providing their own chaperone as one is not provided in our facility. The patient also has the right and is explained the right to defer or refuse any part of the evaluation or treatment including the internal exam. With the patient's consent, PT will use one gloved finger to gently assess the muscles of the pelvic floor, seeing how well it contracts and relaxes and if there is muscle symmetry. After, the patient will get dressed and PT and patient will discuss exam findings and plan of care. PT and patient discuss plan of care, schedule, attendance policy and HEP activities.        Pelvic Floor Special Questions - 10/19/21 0001     Diastasis Recti 2 finger width  separation    Pelvic Floor Internal Exam Pt identity confirmed and verbal consent provided for internal pelvic exam    Exam Type Vaginal    Sensation WNL    Palpation WNL - some atrophy in deep layers    Strength fair squeeze, definite lift    Strength # of reps 5    Strength # of seconds 4    Tone normal               [facility] Adult PT Treatment/Exercise - 10/19/21 0001       Self-Care   Self-Care Other Self-Care Comments   bowel journal, stopping probiotic for trial, the knack, bowel massage     Neuro Re-ed    Neuro Re-ed Details  Pelvic floor contraction training with breath coordination; pelvic floor quick flicks 62I; pelvic floor  isometrics 5 x 10 sec      Manual Therapy   Manual Therapy Myofascial release    Myofascial Release bowel massage to abdomen                     PT Education - 10/19/21 1025     Education Details We continued discussing fiber/water intake and pt agrees to keep a bowel dairy for the next week; she is going to stop align probiotics for the week to see if thatis helpful with gas reduction; the knack for improved urinary and gas incontinence; self-bowel massage to improve motility. [email or web address]    Person(s) Educated Patient    Methods Explanation;Demonstration;Tactile cues;Verbal cues;Handout    Comprehension Verbalized understanding              PT Short Term Goals - 10/10/21 1217       PT SHORT TERM GOAL #1   Title 1. Pt will be independent with HEP in 2 weeks.    Time 2    Period Weeks    Status New    Target Date 10/24/21      PT SHORT TERM GOAL #2   Title Pt will increase thoracolumbar/pelvic moiblity by 25% in all directions in order increase functional mobility.    Time 4    Period Weeks    Status New    Target Date 11/07/21               PT Long Term Goals - 10/10/21 1219       PT LONG TERM GOAL #1   Title Pt will be independent with advanced HEP in 8 weeks.    Time 8    Period Weeks    Status New     Target Date 12/05/21      PT LONG TERM GOAL #2   Title Pt will demonstrate increased pelvic floor muscle strength to 4/5 in order to improve fecal/urinary incontinence.    Time 8    Period Weeks    Status New    Target Date 12/05/21      PT LONG TERM GOAL #3   Title Pt will report 75% improvement in fecal/urinary incontinence in order to improve confidence with community activities.    Time 10    Period Weeks    Status New    Target Date 12/19/21      PT LONG TERM GOAL #4   Title Pt will increase all impaired hip strength by at least 1 muscle grade in order to appropriately support pelvic floor muscles and improve body mechanics with functional activities to appropriately activate pelvic floor muscles.    Time 10    Period Weeks    Status New    Target Date 12/19/21                   Plan - 10/19/21 1101     Clinical Impression Statement Pt overall doing well with seeing some improvements in fecalsmearing just increasingfiber and water; however, due to complaint of excessive gas, she is going to trial not taking probiotic and see how it impacts her. Internal vaginal exam notable for pelvic floor weakness 3/5 but good coordination of contraction, decreased pelvic floor endurance 4 seconds, and mild anterior vaginal wall laxity. She did well with initial pelvic floor strengthening program in order to help improve bowel/bladder control. She will continue to benefit from skilled PT intervention in order to work towards goals.    PT  Treatment/Interventions ADLs/Self Care Home Management;Biofeedback;Cryotherapy;Electrical Stimulation;Moist Heat;Therapeutic activities;Neuromuscular re-education;Therapeutic exercise;Patient/family education;Manual techniques;Passive range of motion;Dry needling;Spinal Manipulations    PT Next Visit Plan Plan to progress pelvic floor strengthening to various seated and standing positions; continue bowel massage; begin core training and functional  strengthening activities with pelvic floor contraction; review bowel journal.    PT Home Exercise Plan [email or web address]    Consulted and Agree with Plan of Care Patient             Patient will benefit from skilled therapeutic intervention in order to improve the following deficits and impairments:  Abnormal gait, Decreased balance, Decreased endurance, Decreased mobility, Increased muscle spasms, Hypomobility, Impaired tone, Decreased scar mobility, Decreased activity tolerance, Decreased coordination, Decreased strength, Increased fascial restricitons, Pain, Postural dysfunction  Visit Diagnosis: Abnormal posture  Muscle weakness (generalized)  Other muscle spasm  Unspecified lack of coordination     Problem List Patient Active Problem List   Diagnosis Date Noted   Elevated LDL cholesterol level 08/22/2021   S/P repair of paraesophageal hernia 04/04/2021   Labyrinthitis 02/13/2021   Medication monitoring encounter 12/23/2017   Family history of ovarian cancer    Family history of colon cancer    Family history of melanoma    Ductal carcinoma in situ (DCIS) of right breast 11/22/2017   Hiatal hernia 12/30/2015   Malignant melanoma of lower leg ([place]) 03/10/2015   Melanoma of right upper arm ([place]) 10/12/2013   GERD (gastroesophageal reflux disease) 10/12/2013   Essential hypertension 10/12/2013   Heather Roberts, PT, DPT01/26/2312:17 PM   [facility] @ [address] [place], [place], [address] Phone: [phone]   Fax:  [phone]  Name: LYNNETT LANGLINAIS MRN: [id number] Date of Birth: 1956/03/13  PHYSICAL THERAPY DISCHARGE SUMMARY  Visits from Start of Care: 2  Current functional level related to goals / functional outcomes: Incomplete   Remaining deficits: See above   Education / Equipment: HEP   Patient agrees to discharge. Patient goals were not met. Patient is being discharged due to not returning since the  last visit.

## 2021-10-19 NOTE — Patient Instructions (Addendum)
The knack: Use this technique while coughing, laughing, sneezing, or with any activities that causes you to leak urine a little. Right before you perform one of these activities that increase pressure in the abdomen and pushes a little urine out, perform a pelvic floor muscle contraction and hold. If that does not completely stop the leaking, try tightening your thighs together in addition to performing a pelvic floor muscle contraction. Make sure you are not trying to stifle a cough, sneeze, or laugh; allow these activities in full as it will cause less pressure down into the bladder and pelvic floor muscles.   Bowel massage: To assist with more regular and more comfortable bowel movements, try performing bowel massage nightly for 5-10 minutes. Place hands in the lower right side of your abdomen to start; in small circles, massage up, across, and down the left side of your abdomen. Pressure does not need to be hard, but just comfortable. You can use lotion or oil to make more comfortable.  Access Code: [email or web address] URL: [email or web address] Date: 10/19/2021 Prepared by: Heather Roberts  Exercises Supine Pelvic Floor Contraction - 3 x daily - 7 x weekly - 2 sets - 10 reps Supine Pelvic Floor Contraction - 3 x daily - 7 x weekly - 1 sets - 10 reps - 10 hold

## 2021-10-25 ENCOUNTER — Ambulatory Visit: Payer: BC Managed Care – PPO

## 2021-10-27 ENCOUNTER — Other Ambulatory Visit: Payer: Self-pay

## 2021-10-27 ENCOUNTER — Ambulatory Visit: Payer: BC Managed Care – PPO | Admitting: Family Medicine

## 2021-10-27 VITALS — BP 120/74 | HR 80 | Temp 98.3°F | Wt 140.6 lb

## 2021-10-27 DIAGNOSIS — I493 Ventricular premature depolarization: Secondary | ICD-10-CM

## 2021-10-27 DIAGNOSIS — J069 Acute upper respiratory infection, unspecified: Secondary | ICD-10-CM

## 2021-10-27 LAB — POCT INFLUENZA A/B
Influenza A, POC: NEGATIVE
Influenza B, POC: NEGATIVE

## 2021-10-27 MED ORDER — BENZONATATE 100 MG PO CAPS: 100.0000 mg | ORAL_CAPSULE | Freq: Two times a day (BID) | ORAL | 0 refills | Status: DC | PRN

## 2021-10-27 NOTE — Patient Instructions (Signed)

## 2021-10-27 NOTE — Progress Notes (Signed)
[facility] [address] [place] [address] Dept: [phone] Dept Fax: [phone]  Office Visit  Subjective:    Patient ID: Ericka Pontiff, female    DOB: [birth year], 66 y.o..   MRN: [id number]  Chief Complaint  Patient presents with   Acute Visit    C/o having cough, body aches, chills, chest congestion x 2 days.  She has taken Tylenol with little relief.  Declines flu shot.     History of Present Illness:  Patient is in today for evaluation of a 2-day history of cough, myalgias, chills, and tightness in her chest. She has not run fever. She has some mild rhinorrhea and nasal congestion. She is on daily Zyrtec for allergies. She denies tobacco use. She has used some Tylenol. She took a home COVID test, which was negative.  Ms. Oland notes she was seen by an orthopedist last week with an upper back pain issue. He did a chest x-ray, which was reportedly clear. He sent her to Urgent Care to make sure about any cardiac reasons for the chest pain. She notes they did do lab work and an EKG. She was cleared and returned home.  Past Medical History: Patient Active Problem List   Diagnosis Date Noted   Elevated LDL cholesterol level 08/22/2021   S/P repair of paraesophageal hernia 04/04/2021   Labyrinthitis 02/13/2021   Medication monitoring encounter 12/23/2017   Family history of ovarian cancer    Family history of colon cancer    Family history of melanoma    Ductal carcinoma in situ (DCIS) of right breast 11/22/2017   Hiatal hernia 12/30/2015   Malignant melanoma of lower leg ([place]) 03/10/2015   Melanoma of right upper arm ([place]) 10/12/2013   GERD (gastroesophageal reflux disease) 10/12/2013   Essential hypertension 10/12/2013   Past Surgical History:  Procedure Laterality Date   ANTERIOR LAT LUMBAR FUSION Right 04/17/2013   Procedure: ANTERIOR LATERAL LUMBAR FUSION 3 LEVELS;  Surgeon: Erline Levine, MD;  Location: [facility];  Service: Neurosurgery;  Laterality: Right;  Right Lumbar Two-Three Lumbar Three-Four Lumbar Four-Five  Anterolateral Fusion   BACK SURGERY     BREAST LUMPECTOMY WITH RADIOACTIVE SEED LOCALIZATION Right 12/25/2017   Procedure: RIGHT BREAST LUMPECTOMY WITH RADIOACTIVE SEED LOCALIZATION;  Surgeon: Fanny Skates, MD;  Location: [facility];  Service: General;  Laterality: Right;   [place]   COLONOSCOPY     COLONOSCOPY WITH PROPOFOL N/A 06/23/2018   Procedure: COLONOSCOPY WITH PROPOFOL;  Surgeon: Clarene Essex, MD;  [facility];  Service: Endoscopy;  Laterality: N/A;   CYSTOSCOPY N/A 06/26/2016   Procedure: CYSTOSCOPY;  Surgeon: Megan Salon, MD;  Location: [facility];  Service: Gynecology;  Laterality: N/A;   DILATATION & CURETTAGE/HYSTEROSCOPY WITH MYOSURE N/A 01/24/2016   Procedure: DILATATION & CURETTAGE/HYSTEROSCOPY WITH MYOSURE;  Surgeon: Megan Salon, MD;  Location: [facility];  Service: Gynecology;  Laterality: N/A;   FOOT SURGERY Right    early 20s-bone spur removal   HAND SURGERY Bilateral    for basal joint arthritis   LAPAROSCOPIC HYSTERECTOMY Bilateral 06/26/2016   Procedure: HYSTERECTOMY TOTAL LAPAROSCOPIC;  Surgeon: Megan Salon, MD;  Location: [facility];  Service: Gynecology;  Laterality: Bilateral;   LUMBAR PERCUTANEOUS PEDICLE SCREW 3 LEVEL N/A 04/17/2013   Procedure: LUMBAR PERCUTANEOUS PEDICLE SCREW 3 LEVEL;  Surgeon: Erline Levine, MD;  Location: [facility];  Service: Neurosurgery;  Laterality: N/A;  Decompression and Posterior Lumbar Interbody Fusion of Lumbar Five-Sacral One, Percutaneous Screws Lumbar Two through Sacral One   MELANOMA EXCISION     knee 08 lft. elbow 11rt   SALPINGOOPHORECTOMY Bilateral 06/26/2016   Procedure: SALPINGO OOPHORECTOMY;  Surgeon: Megan Salon, MD;  Location: [facility];  Service: Gynecology;  Laterality: Bilateral;   SHOULDER SURGERY Right 04/28/2015    STRABISMUS SURGERY Bilateral 07/29/2020   Procedure: BILATERAL STRABISMUS REPAIR;  Surgeon: Everitt Amber, MD;  Location: [facility];  Service: Ophthalmology;  Laterality: Bilateral;   TUBAL LIGATION  1986   WLE  06/17/2007   and left groin lymph node   XI ROBOTIC ASSISTED HIATAL HERNIA REPAIR N/A 04/04/2021   Procedure: ROBOTIC HIATAL HERNIA REPAIR WITH FUNDOPLICATION;  Surgeon: Ralene Ok, MD;  Location: [facility];  Service: General;  Laterality: N/A;   Family History  Problem Relation Age of Onset   Diabetes Father        borderline   Melanoma Father        dx 46s/60s; deceased 43s   Congestive Heart Failure Father    Colon cancer Mother        dx 68s; deceased 3   Hypertension Mother    Osteoporosis Mother    Ovarian cancer Maternal Grandmother        deceased 59s   Heart disease Paternal Grandfather    Melanoma Daughter        on leg; dx 73s   Outpatient Medications Prior to Visit  Medication Sig Dispense Refill   acetaminophen (TYLENOL) 500 MG tablet Take 1,000 mg by mouth 2 (two) times daily as needed for mild pain.     Ascorbic Acid (VITAMIN C WITH ROSE HIPS) 1000 MG tablet Take 1,000 mg by mouth daily.     atorvastatin (LIPITOR) 10 MG tablet Take 1 tablet (10 mg total) by mouth daily. 90 tablet 3   cetirizine (ZYRTEC) 10 MG tablet Take 10 mg by mouth daily.     fluorouracil (EFUDEX) 5 % cream fluorouracil 5 % topical cream  APPLY 1 A SMALL AMOUNT TO AFFECTED AREA TWICE A DAY APPLY TWICE DAILY TO AFFECTED AREAS X 2 WEEKS     lidocaine (LIDODERM) 5 % lidocaine 5 % topical patch  APPLY 1 PATCH BY TOPICAL ROUTE ONCE DAILY (MAY WEAR UP TO 12HOURS.)     losartan-hydrochlorothiazide (HYZAAR) 100-12.5 MG tablet TAKE 1 TABLET BY MOUTH DAILY. REPORTED ON 01/23/2016 90 tablet 1   meclizine (ANTIVERT) 25 MG tablet Take 1 tablet (25 mg total) by mouth 3 (three) times daily as needed (vertigo). Reported on 01/23/2016 30 tablet 1   Multiple Vitamins-Minerals (ALIVE  WOMENS 50+) TABS Take 1 tablet by mouth daily.     ondansetron (ZOFRAN-ODT) 8 MG disintegrating tablet Take 8 mg by mouth every 8 (eight) hours as needed for nausea or vomiting.     psyllium (REGULOID) 0.52 g capsule Take 0.52 g by mouth daily.     raloxifene (EVISTA) 60 MG tablet TAKE 1 TABLET BY MOUTH EVERY DAY (Patient taking differently: Take 60 mg by mouth daily.) 90 tablet 3   traMADol (ULTRAM) 50 MG tablet Take 50 mg by mouth every 6 (six) hours as needed (for pain).     Turmeric (QC TUMERIC COMPLEX) 500 MG CAPS Take by mouth.     polyethylene glycol (MIRALAX / GLYCOLAX) packet Take 17 g by mouth daily as needed for moderate constipation.     Probiotic  Product (ALIGN) 4 MG CAPS Take 4 mg by mouth at bedtime. (Patient not taking: Reported on 10/27/2021)     tiZANidine (ZANAFLEX) 2 MG tablet tizanidine 2 mg tablet  TAKE 1 TABLET BY MOUTH EVERY DAY     Calcium-Vitamin D-Vitamin K (VIACTIV CALCIUM PLUS D PO) Take 1 tablet by mouth daily.     No facility-administered medications prior to visit.   Allergies  Allergen Reactions   Elemental Sulfur Hives   Objective:   Today's Vitals   10/27/21 1138  BP: 120/74  Pulse: 80  Temp: 98.3 F (36.8 C)  TempSrc: Temporal  SpO2: 94%  Weight: 140 lb 9.6 oz (63.8 kg)   Body mass index is 25.72 kg/m.   General: Well developed, well nourished. No acute distress. HEENT: Normocephalic, non-traumatic. Conjunctiva clear. External ears normal. Right EAC with wax.   Left TM normal. Nose with mild congestion. Mucous membranes moist. Mild mucous streakign of   posterior oropharynx. Good dentition. Neck: Supple. No lymphadenopathy. No thyromegaly. Lungs: Mild mucousy breath sounds bilaterally. No wheezing, rales or rhonchi. CV: RRR without murmurs or rubs, but occasional premature beats. Pulses 2+ bilaterally. Psych: Alert and oriented. Normal mood and affect.  Health Maintenance Due  Topic Date Due   Hepatitis C Screening  Never done   COVID-19  Vaccine (3 - Pfizer risk series) 03/28/2020   Pneumonia Vaccine 54+ Years old (2 - PPSV23 if available, else PCV20) 03/31/2021   EKG (10/17/2021) Sinus rhythm (rate= 72) with occasional PAC and PVC.    Assessment & Plan:   1. Viral URI with cough Discussed home care for viral illness, including rest, pushing fluids, and OTC medications as needed for symptom relief. Recommend hot tea with honey for sore throat symptoms. Follow-up if needed for worsening or persistent symptoms.  - POCT Influenza A/B - benzonatate (TESSALON) 100 MG capsule; Take 1 capsule (100 mg total) by mouth 2 (two) times daily as needed for cough.  Dispense: 20 capsule; Refill: 0  2. PVC's (premature ventricular contractions) Reviewed prior EKGs and recent labs form ED visit. Patient having some occasional PVCs. Appear benign. Reassured her.  Haydee Salter, MD

## 2021-11-10 NOTE — Progress Notes (Signed)
[facility] Note  11/13/2021     CHIEF COMPLAINT Patient presents for Retina Follow Up   HISTORY OF PRESENT ILLNESS: Melissa Mcclure is a 66 y.o. female who presents to the clinic today for:   HPI     Retina Follow Up   Diagnosis: retinoschisis.  In both eyes.  Severity is moderate.  Duration of 4 weeks.  Since onset it is stable.  I, the attending physician,  performed the HPI with the patient and updated documentation appropriately.        Comments   Patient states vision the same OU. Still has floaters OU, but no worse than before.       Last edited by Bernarda Caffey, MD on 11/13/2021  1:20 PM.     Referring physician: Libby Maw, MD [place],  [place] [address]  HISTORICAL INFORMATION:   Selected notes from the MEDICAL RECORD NUMBER Referred by Dr. Lucianne Lei for concern of retinoschisis OS LEE:  Ocular Hx- PMH-    CURRENT MEDICATIONS: No current outpatient medications on file. (Ophthalmic Drugs)   No current facility-administered medications for this visit. (Ophthalmic Drugs)   Current Outpatient Medications (Other)  Medication Sig   acetaminophen (TYLENOL) 500 MG tablet Take 1,000 mg by mouth 2 (two) times daily as needed for mild pain.   Ascorbic Acid (VITAMIN C WITH ROSE HIPS) 1000 MG tablet Take 1,000 mg by mouth daily.   atorvastatin (LIPITOR) 10 MG tablet Take 1 tablet (10 mg total) by mouth daily.   benzonatate (TESSALON) 100 MG capsule Take 1 capsule (100 mg total) by mouth 2 (two) times daily as needed for cough.   cetirizine (ZYRTEC) 10 MG tablet Take 10 mg by mouth daily.   fluorouracil (EFUDEX) 5 % cream fluorouracil 5 % topical cream  APPLY 1 A SMALL AMOUNT TO AFFECTED AREA TWICE A DAY APPLY TWICE DAILY TO AFFECTED AREAS X 2 WEEKS   lidocaine (LIDODERM) 5 % lidocaine 5 % topical patch  APPLY 1 PATCH BY TOPICAL ROUTE ONCE DAILY (MAY WEAR UP TO 12HOURS.)   losartan-hydrochlorothiazide (HYZAAR)  100-12.5 MG tablet TAKE 1 TABLET BY MOUTH DAILY. REPORTED ON 01/23/2016   meclizine (ANTIVERT) 25 MG tablet Take 1 tablet (25 mg total) by mouth 3 (three) times daily as needed (vertigo). Reported on 01/23/2016   Multiple Vitamins-Minerals (ALIVE WOMENS 50+) TABS Take 1 tablet by mouth daily.   Probiotic Product (ALIGN) 4 MG CAPS Take 4 mg by mouth at bedtime.   psyllium (REGULOID) 0.52 g capsule Take 0.52 g by mouth daily.   raloxifene (EVISTA) 60 MG tablet TAKE 1 TABLET BY MOUTH EVERY DAY   tiZANidine (ZANAFLEX) 2 MG tablet tizanidine 2 mg tablet  TAKE 1 TABLET BY MOUTH EVERY DAY   traMADol (ULTRAM) 50 MG tablet Take 50 mg by mouth every 6 (six) hours as needed (for pain).   Turmeric 500 MG CAPS Take by mouth.   ondansetron (ZOFRAN-ODT) 8 MG disintegrating tablet Take 8 mg by mouth every 8 (eight) hours as needed for nausea or vomiting. (Patient not taking: Reported on 11/13/2021)   No current facility-administered medications for this visit. (Other)   REVIEW OF SYSTEMS: ROS   Positive for: Cardiovascular, Eyes Negative for: Constitutional, Gastrointestinal, Neurological, Skin, Genitourinary, Musculoskeletal, HENT, Endocrine, Respiratory, Psychiatric, Allergic/Imm, Heme/Lymph Last edited by Roselee Nova D, COT on 11/13/2021 12:59 PM.     ALLERGIES Allergies  Allergen Reactions   Elemental Sulfur Hives   PAST  MEDICAL HISTORY Past Medical History:  Diagnosis Date   Arthritis    Cancer ([place]) 04/2010   melanoma-left knee, right arm   Chronic back pain    Complication of anesthesia    Ductal carcinoma in situ (DCIS) of right breast 10/2017   Family history of adverse reaction to anesthesia    mother has PONV   Family history of colon cancer    Family history of melanoma    Family history of ovarian cancer    Genetic testing 12/23/2017   STAT Breast panel with reflex to Multi-Cancer panel (83 genes) @ [facility] - No pathogenic mutations detected   GERD (gastroesophageal reflux disease)     H/O hiatal hernia    Heart murmur    no problems   History of hand surgery    Hypertension    PONV (postoperative nausea and vomiting)    "zofran works great   Seasonal allergies    Vertigo    Past Surgical History:  Procedure Laterality Date   ANTERIOR LAT LUMBAR FUSION Right 04/17/2013   Procedure: ANTERIOR LATERAL LUMBAR FUSION 3 LEVELS;  Surgeon: Erline Levine, MD;  Location: [facility];  Service: Neurosurgery;  Laterality: Right;  Right Lumbar Two-Three Lumbar Three-Four Lumbar Four-Five  Anterolateral Fusion   BACK SURGERY     BREAST LUMPECTOMY WITH RADIOACTIVE SEED LOCALIZATION Right 12/25/2017   Procedure: RIGHT BREAST LUMPECTOMY WITH RADIOACTIVE SEED LOCALIZATION;  Surgeon: Fanny Skates, MD;  Location: [facility];  Service: General;  Laterality: Right;   [place]   COLONOSCOPY     COLONOSCOPY WITH PROPOFOL N/A 06/23/2018   Procedure: COLONOSCOPY WITH PROPOFOL;  Surgeon: Clarene Essex, MD;  Location: [facility];  Service: Endoscopy;  Laterality: N/A;   CYSTOSCOPY N/A 06/26/2016   Procedure: CYSTOSCOPY;  Surgeon: Megan Salon, MD;  Location: [facility];  Service: Gynecology;  Laterality: N/A;   DILATATION & CURETTAGE/HYSTEROSCOPY WITH MYOSURE N/A 01/24/2016   Procedure: DILATATION & CURETTAGE/HYSTEROSCOPY WITH MYOSURE;  Surgeon: Megan Salon, MD;  Location: [facility];  Service: Gynecology;  Laterality: N/A;   FOOT SURGERY Right    early 20s-bone spur removal   HAND SURGERY Bilateral    for basal joint arthritis   LAPAROSCOPIC HYSTERECTOMY Bilateral 06/26/2016   Procedure: HYSTERECTOMY TOTAL LAPAROSCOPIC;  Surgeon: Megan Salon, MD;  Location: [facility];  Service: Gynecology;  Laterality: Bilateral;   LUMBAR PERCUTANEOUS PEDICLE SCREW 3 LEVEL N/A 04/17/2013   Procedure: LUMBAR PERCUTANEOUS PEDICLE SCREW 3 LEVEL;  Surgeon: Erline Levine, MD;  Location: [facility];  Service: Neurosurgery;  Laterality:  N/A;  Decompression and Posterior Lumbar Interbody Fusion of Lumbar Five-Sacral One, Percutaneous Screws Lumbar Two through Sacral One   MELANOMA EXCISION     knee 08 lft. elbow 11rt   SALPINGOOPHORECTOMY Bilateral 06/26/2016   Procedure: SALPINGO OOPHORECTOMY;  Surgeon: Megan Salon, MD;  Location: [facility];  Service: Gynecology;  Laterality: Bilateral;   SHOULDER SURGERY Right 04/28/2015   STRABISMUS SURGERY Bilateral 07/29/2020   Procedure: BILATERAL STRABISMUS REPAIR;  Surgeon: Everitt Amber, MD;  Location: [facility];  Service: Ophthalmology;  Laterality: Bilateral;   TUBAL LIGATION  1986   [facility]  06/17/2007   and left groin lymph node   XI ROBOTIC ASSISTED HIATAL HERNIA REPAIR N/A 04/04/2021   Procedure: ROBOTIC HIATAL HERNIA REPAIR WITH FUNDOPLICATION;  Surgeon: Ralene Ok, MD;  Location: [facility];  Service: General;  Laterality:  N/A;   FAMILY HISTORY Family History  Problem Relation Age of Onset   Diabetes Father        borderline   Melanoma Father        dx 62s/60s; deceased 32s   Congestive Heart Failure Father    Colon cancer Mother        dx 58s; deceased 53   Hypertension Mother    Osteoporosis Mother    Ovarian cancer Maternal Grandmother        deceased 61s   Heart disease Paternal Grandfather    Melanoma Daughter        on leg; dx 35s   SOCIAL HISTORY Social History   Tobacco Use   Smoking status: Never   Smokeless tobacco: Never  Vaping Use   Vaping Use: Never used  Substance Use Topics   Alcohol use: Yes    Alcohol/week: 0.0 - 1.0 standard drinks   Drug use: No       OPHTHALMIC EXAM:  Base Eye Exam     Visual Acuity (Snellen - Linear)       Right Left   Dist [place] 20/25 +2 20/20 -2   Dist ph [place] 20/20          Tonometry (Tonopen, 1:08 PM)       Right Left   Pressure 12 11         Pupils       Dark Light Shape React APD   Right 2 1 Round Slow None   Left 2 1 Round Slow None         Visual Fields (Counting  fingers)       Left Right    Full Full         Extraocular Movement       Right Left    Full, Ortho Full, Ortho         Neuro/Psych     Oriented x3: Yes   Mood/Affect: Normal         Dilation     Both eyes: 1.0% Mydriacyl, 2.5% Phenylephrine @ 1:09 PM           Slit Lamp and Fundus Exam     Slit Lamp Exam       Right Left   Lids/Lashes Dermatochalasis - upper lid Dermatochalasis - upper lid   Conjunctiva/Sclera White and quiet Mild nasal and temporal pinguecula   Cornea 1-2+ inferior Punctate epithelial erosions, mild tear film debris 1-2+ inferior Punctate epithelial erosions, mild tear film debris   Anterior Chamber deep, clear, narrow temporal angle deep, clear, narrow temporal angle   Iris Round and dilated Round and dilated   Lens 2+ Nuclear sclerosis, 2+ Cortical cataract, pigment deposition on inferior capsule 2+ Nuclear sclerosis, 2+ Cortical cataract, pigment deposition on inferior capsule   Anterior Vitreous Vitreous syneresis Vitreous syneresis, Posterior vitreous detachment         Fundus Exam       Right Left   Disc Pink and Sharp, +cupping, mild temporal PPA Pink and Sharp, +cupping, mild temporal PPA   C/D Ratio 0.65 0.65   Macula Flat, Good foveal reflex, mild RPE mottling, No heme or edema Flat, Good foveal reflex, mild RPE mottling, No heme or edema   Vessels mild attenuation mild attenuation   Periphery Attached, focal CR atrophy at 0900, shallow schisis ST periphery, no RT/RD Attached, schisis from 0130-0300, no RT/RD on scleral depression           IMAGING  AND PROCEDURES  Imaging and Procedures for 11/13/2021  OCT, Retina - OU - Both Eyes       Right Eye Quality was good. Central Foveal Thickness: 277. Progression has been stable. Findings include normal foveal contour, no IRF, no SRF (Partial PVD).   Left Eye Quality was good. Central Foveal Thickness: 288. Progression has been stable. Findings include normal foveal  contour, no IRF, no SRF (Bullous retinoschisis from 0100-0230 caught on widefield -- stable).   Notes *Images captured and stored on drive  Diagnosis / Impression:  OD: NFP, no IRF/SRF OS: Bullous retinoschisis from 0100-0230 caught on widefield -- stable from prior  Clinical management:  See below  Abbreviations: NFP - Normal foveal profile. CME - cystoid macular edema. PED - pigment epithelial detachment. IRF - intraretinal fluid. SRF - subretinal fluid. EZ - ellipsoid zone. ERM - epiretinal membrane. ORA - outer retinal atrophy. ORT - outer retinal tubulation. SRHM - subretinal hyper-reflective material. IRHM - intraretinal hyper-reflective material            ASSESSMENT/PLAN:    ICD-10-CM   1. Bilateral retinoschisis  H33.103 OCT, Retina - OU - Both Eyes    2. Essential hypertension  I10     3. Hypertensive retinopathy of both eyes  H35.033     4. History of strabismus surgery  Z98.890     5. Combined forms of age-related cataract of both eyes  H25.813      1. Retinoschisis OU  - OD: shallow schisis ST periphery - OS: bullous schisis located from 0100-0230 - no RT/RD OU - discussed findings, prognosis, treatment options - no retinal or ophthalmic interventions indicated or recommended at this time - recommend monitoring - f/u 3-4 months, DFE, OCT -- scan through schisis  2,3. Hypertensive retinopathy OU - discussed importance of tight BP control - monitor  4. History of strabismus surgery  - pt reports history of XT  - s/p strabismus SX Dr. Annamaria Boots (11.05.21)  5. Mixed Cataract OU - The symptoms of cataract, surgical options, and treatments and risks were discussed with patient. - discussed diagnosis and progression - not yet visually significant - monitor for now   Ophthalmic Meds Ordered this visit:  No orders of the defined types were placed in this encounter.    Return for f/u 3-4 months, retinoschisis OU, DFE, OCT.  There are no Patient  Instructions on file for this visit.   Explained the diagnoses, plan, and follow up with the patient and they expressed understanding.  Patient expressed understanding of the importance of proper follow up care.   This document serves as a record of services personally performed by Gardiner Sleeper, MD, PhD. It was created on their behalf by Roselee Nova, COMT. The creation of this record is the provider's dictation and/or activities during the visit.  Electronically signed by: Roselee Nova, COMT 11/15/21 12:58 PM  This document serves as a record of services personally performed by Gardiner Sleeper, MD, PhD. It was created on their behalf by San Jetty. Owens Shark, OA an ophthalmic technician. The creation of this record is the provider's dictation and/or activities during the visit.    Electronically signed by: San Jetty. Owens Shark, [place] 02.20.2023 12:58 PM   Gardiner Sleeper, M.D., Ph.D. Diseases & Surgery of the Retina and Vitreous [facility]  I have reviewed the above documentation for accuracy and completeness, and I agree with the above. Gardiner Sleeper, M.D., Ph.D. 11/15/21 12:59 PM  Abbreviations: M myopia (nearsighted); A astigmatism; H hyperopia (farsighted); P presbyopia; Mrx spectacle prescription;  CTL contact lenses; OD right eye; OS left eye; OU both eyes  XT exotropia; ET esotropia; PEK punctate epithelial keratitis; PEE punctate epithelial erosions; DES dry eye syndrome; MGD meibomian gland dysfunction; ATs artificial tears; PFAT's preservative free artificial tears; [place] nuclear sclerotic cataract; PSC posterior subcapsular cataract; ERM epi-retinal membrane; PVD posterior vitreous detachment; RD retinal detachment; DM diabetes mellitus; DR diabetic retinopathy; NPDR non-proliferative diabetic retinopathy; PDR proliferative diabetic retinopathy; CSME clinically significant macular edema; DME diabetic macular edema; dbh dot blot hemorrhages; CWS cotton wool spot; POAG  primary open angle glaucoma; C/D cup-to-disc ratio; HVF humphrey visual field; GVF goldmann visual field; OCT optical coherence tomography; IOP intraocular pressure; BRVO Branch retinal vein occlusion; CRVO central retinal vein occlusion; CRAO central retinal artery occlusion; BRAO branch retinal artery occlusion; RT retinal tear; SB scleral buckle; PPV pars plana vitrectomy; VH Vitreous hemorrhage; PRP panretinal laser photocoagulation; IVK intravitreal kenalog; VMT vitreomacular traction; MH Macular hole;  NVD neovascularization of the disc; NVE neovascularization elsewhere; AREDS age related eye disease study; ARMD age related macular degeneration; POAG primary open angle glaucoma; EBMD epithelial/anterior basement membrane dystrophy; ACIOL anterior chamber intraocular lens; IOL intraocular lens; PCIOL posterior chamber intraocular lens; Phaco/IOL phacoemulsification with intraocular lens placement; [place] photorefractive keratectomy; LASIK laser assisted in situ keratomileusis; HTN hypertension; DM diabetes mellitus; COPD chronic obstructive pulmonary disease

## 2021-11-13 ENCOUNTER — Other Ambulatory Visit: Payer: Self-pay

## 2021-11-13 ENCOUNTER — Ambulatory Visit (INDEPENDENT_AMBULATORY_CARE_PROVIDER_SITE_OTHER): Payer: BC Managed Care – PPO | Admitting: Ophthalmology

## 2021-11-13 ENCOUNTER — Other Ambulatory Visit: Payer: Self-pay | Admitting: Hematology

## 2021-11-13 ENCOUNTER — Encounter (INDEPENDENT_AMBULATORY_CARE_PROVIDER_SITE_OTHER): Payer: Self-pay | Admitting: Ophthalmology

## 2021-11-13 DIAGNOSIS — Z9889 Other specified postprocedural states: Secondary | ICD-10-CM | POA: Diagnosis not present

## 2021-11-13 DIAGNOSIS — I10 Essential (primary) hypertension: Secondary | ICD-10-CM

## 2021-11-13 DIAGNOSIS — H35033 Hypertensive retinopathy, bilateral: Secondary | ICD-10-CM

## 2021-11-13 DIAGNOSIS — H33103 Unspecified retinoschisis, bilateral: Secondary | ICD-10-CM

## 2021-11-13 DIAGNOSIS — H25813 Combined forms of age-related cataract, bilateral: Secondary | ICD-10-CM

## 2021-11-22 ENCOUNTER — Encounter: Payer: Self-pay | Admitting: Family Medicine

## 2021-11-22 ENCOUNTER — Other Ambulatory Visit: Payer: Self-pay

## 2021-11-22 ENCOUNTER — Ambulatory Visit: Payer: BC Managed Care – PPO | Admitting: Family Medicine

## 2021-11-22 VITALS — BP 140/76 | HR 65 | Temp 97.1°F | Ht 62.0 in | Wt 140.0 lb

## 2021-11-22 DIAGNOSIS — M545 Low back pain, unspecified: Secondary | ICD-10-CM

## 2021-11-22 DIAGNOSIS — E7800 Pure hypercholesterolemia, unspecified: Secondary | ICD-10-CM | POA: Diagnosis not present

## 2021-11-22 DIAGNOSIS — I10 Essential (primary) hypertension: Secondary | ICD-10-CM

## 2021-11-22 DIAGNOSIS — Z23 Encounter for immunization: Secondary | ICD-10-CM

## 2021-11-22 DIAGNOSIS — I491 Atrial premature depolarization: Secondary | ICD-10-CM

## 2021-11-22 DIAGNOSIS — G8929 Other chronic pain: Secondary | ICD-10-CM

## 2021-11-22 LAB — URINALYSIS, ROUTINE W REFLEX MICROSCOPIC
Bilirubin Urine: NEGATIVE
Hgb urine dipstick: NEGATIVE
Ketones, ur: NEGATIVE
Leukocytes,Ua: NEGATIVE
Nitrite: NEGATIVE
Specific Gravity, Urine: 1.015 (ref 1.000–1.030)
Total Protein, Urine: NEGATIVE
Urine Glucose: NEGATIVE
Urobilinogen, UA: 0.2 (ref 0.0–1.0)
pH: 6.5 (ref 5.0–8.0)

## 2021-11-22 LAB — COMPREHENSIVE METABOLIC PANEL
ALT: 22 U/L (ref 0–35)
AST: 23 U/L (ref 0–37)
Albumin: 4.2 g/dL (ref 3.5–5.2)
Alkaline Phosphatase: 55 U/L (ref 39–117)
BUN: 21 mg/dL (ref 6–23)
CO2: 31 mEq/L (ref 19–32)
Calcium: 9.7 mg/dL (ref 8.4–10.5)
Chloride: 104 mEq/L (ref 96–112)
Creatinine, Ser: 0.75 mg/dL (ref 0.40–1.20)
GFR: 82.62 mL/min (ref >60.00)
Glucose, Bld: 97 mg/dL (ref 70–99)
Potassium: 3.4 mEq/L — ABNORMAL LOW (ref 3.5–5.1)
Sodium: 142 mEq/L (ref 135–145)
Total Bilirubin: 0.6 mg/dL (ref 0.2–1.2)
Total Protein: 6.5 g/dL (ref 6.0–8.3)

## 2021-11-22 LAB — CBC
HCT: 36.6 % (ref 36.0–46.0)
Hemoglobin: 12.4 g/dL (ref 12.0–15.0)
MCHC: 33.8 g/dL (ref 30.0–36.0)
MCV: 93.1 fl (ref 78.0–100.0)
Platelets: 209 10*3/uL (ref 150.0–400.0)
RBC: 3.94 Mil/uL (ref 3.87–5.11)
RDW: 13 % (ref 11.5–15.5)
WBC: 4.6 10*3/uL (ref 4.0–10.5)

## 2021-11-22 LAB — LIPID PANEL
Cholesterol: 165 mg/dL (ref 0–200)
HDL: 73.7 mg/dL (ref >39.00)
LDL Cholesterol: 77 mg/dL (ref 0–99)
NonHDL: 91.75
Total CHOL/HDL Ratio: 2
Triglycerides: 73 mg/dL (ref 0.0–149.0)
VLDL: 14.6 mg/dL (ref 0.0–40.0)

## 2021-11-22 NOTE — Progress Notes (Signed)
Established Patient Office Visit  Subjective:  Patient ID: Melissa Mcclure, female    DOB: 26-Nov-1955  Age: 66 y.o. MRN: [id number]  CC:  Chief Complaint  Patient presents with   Follow-up    3 month follow up on BP and labs patient fasting. Would like vein on back of leg checked, little cough sometimes not sure if this is from cholesterol medications.     HPI Melissa Mcclure presents for follow-up of hypertension, elevated LDL cholesterol, history of PACs.  Recent negative cardiac work-up in the ER.  PACs were noted.  The patient was asymptomatic.  Denies chest pain shortness of breath or palpitations.  Having no issues taking atorvastatin.  Blood pressure is well controlled on her current regimen with Hyzaar.  Past Medical History:  Diagnosis Date   Arthritis    Cancer ([place]) 04/2010   melanoma-left knee, right arm   Chronic back pain    Complication of anesthesia    Ductal carcinoma in situ (DCIS) of right breast 10/2017   Family history of adverse reaction to anesthesia    mother has PONV   Family history of colon cancer    Family history of melanoma    Family history of ovarian cancer    Genetic testing 12/23/2017   STAT Breast panel with reflex to Multi-Cancer panel (83 genes) @ [facility] - No pathogenic mutations detected   GERD (gastroesophageal reflux disease)    H/O hiatal hernia    Heart murmur    no problems   History of hand surgery    Hypertension    PONV (postoperative nausea and vomiting)    "zofran works great   Seasonal allergies    Vertigo     Past Surgical History:  Procedure Laterality Date   ANTERIOR LAT LUMBAR FUSION Right 04/17/2013   Procedure: ANTERIOR LATERAL LUMBAR FUSION 3 LEVELS;  Surgeon: Erline Levine, MD;  Location: [facility];  Service: Neurosurgery;  Laterality: Right;  Right Lumbar Two-Three Lumbar Three-Four Lumbar Four-Five  Anterolateral Fusion   BACK SURGERY     BREAST LUMPECTOMY WITH RADIOACTIVE SEED LOCALIZATION Right 12/25/2017    Procedure: RIGHT BREAST LUMPECTOMY WITH RADIOACTIVE SEED LOCALIZATION;  Surgeon: Fanny Skates, MD;  Location: [facility];  Service: General;  Laterality: Right;   [place]   COLONOSCOPY     COLONOSCOPY WITH PROPOFOL N/A 06/23/2018   Procedure: COLONOSCOPY WITH PROPOFOL;  Surgeon: Clarene Essex, MD;  Location: [facility];  Service: Endoscopy;  Laterality: N/A;   CYSTOSCOPY N/A 06/26/2016   Procedure: CYSTOSCOPY;  Surgeon: Megan Salon, MD;  Location: [facility];  Service: Gynecology;  Laterality: N/A;   DILATATION & CURETTAGE/HYSTEROSCOPY WITH MYOSURE N/A 01/24/2016   Procedure: DILATATION & CURETTAGE/HYSTEROSCOPY WITH MYOSURE;  Surgeon: Megan Salon, MD;  Location: [facility];  Service: Gynecology;  Laterality: N/A;   FOOT SURGERY Right    early 20s-bone spur removal   HAND SURGERY Bilateral    for basal joint arthritis   LAPAROSCOPIC HYSTERECTOMY Bilateral 06/26/2016   Procedure: HYSTERECTOMY TOTAL LAPAROSCOPIC;  Surgeon: Megan Salon, MD;  Location: [facility];  Service: Gynecology;  Laterality: Bilateral;   LUMBAR PERCUTANEOUS PEDICLE SCREW 3 LEVEL N/A 04/17/2013   Procedure: LUMBAR PERCUTANEOUS PEDICLE SCREW 3 LEVEL;  Surgeon: Erline Levine, MD;  Location: [facility];  Service: Neurosurgery;  Laterality: N/A;  Decompression and Posterior Lumbar Interbody Fusion of Lumbar Five-Sacral One,  Percutaneous Screws Lumbar Two through Sacral One   MELANOMA EXCISION     knee 08 lft. elbow 11rt   SALPINGOOPHORECTOMY Bilateral 06/26/2016   Procedure: SALPINGO OOPHORECTOMY;  Surgeon: Megan Salon, MD;  Location: [facility];  Service: Gynecology;  Laterality: Bilateral;   SHOULDER SURGERY Right 04/28/2015   STRABISMUS SURGERY Bilateral 07/29/2020   Procedure: BILATERAL STRABISMUS REPAIR;  Surgeon: Everitt Amber, MD;  Location: [facility];  Service: Ophthalmology;  Laterality: Bilateral;   TUBAL LIGATION  1986   [facility]   06/17/2007   and left groin lymph node   XI ROBOTIC ASSISTED HIATAL HERNIA REPAIR N/A 04/04/2021   Procedure: ROBOTIC HIATAL HERNIA REPAIR WITH FUNDOPLICATION;  Surgeon: Ralene Ok, MD;  Location: [facility];  Service: General;  Laterality: N/A;    Family History  Problem Relation Age of Onset   Diabetes Father        borderline   Melanoma Father        dx 34s/60s; deceased 46s   Congestive Heart Failure Father    Colon cancer Mother        dx 63s; deceased 11   Hypertension Mother    Osteoporosis Mother    Ovarian cancer Maternal Grandmother        deceased 54s   Heart disease Paternal Grandfather    Melanoma Daughter        on leg; dx 29s    Social History   Socioeconomic History   Marital status: Married    Spouse name: Not on file   Number of children: Not on file   Years of education: Not on file   Highest education level: Not on file  Occupational History   Not on file  Tobacco Use   Smoking status: Never   Smokeless tobacco: Never  Vaping Use   Vaping Use: Never used  Substance and Sexual Activity   Alcohol use: Yes    Alcohol/week: 0.0 - 1.0 standard drinks   Drug use: No   Sexual activity: Not Currently    Partners: Male    Birth control/protection: Surgical, Post-menopausal    Comment: hysterectomy  Other Topics Concern   Not on file  Social History Narrative   Not on file   Social Determinants of Health   Financial Resource Strain: Not on file  Food Insecurity: Not on file  Transportation Needs: Not on file  Physical Activity: Not on file  Stress: Not on file  Social Connections: Not on file  Intimate Partner Violence: Not on file    Outpatient Medications Prior to Visit  Medication Sig Dispense Refill   acetaminophen (TYLENOL) 500 MG tablet Take 1,000 mg by mouth 2 (two) times daily as needed for mild pain.     Ascorbic Acid (VITAMIN C WITH ROSE HIPS) 1000 MG tablet Take 1,000 mg by mouth daily.     atorvastatin (LIPITOR) 10 MG tablet  Take 1 tablet (10 mg total) by mouth daily. 90 tablet 3   cetirizine (ZYRTEC) 10 MG tablet Take 10 mg by mouth daily.     fluorouracil (EFUDEX) 5 % cream fluorouracil 5 % topical cream  APPLY 1 A SMALL AMOUNT TO AFFECTED AREA TWICE A DAY APPLY TWICE DAILY TO AFFECTED AREAS X 2 WEEKS     lidocaine (LIDODERM) 5 % lidocaine 5 % topical patch  APPLY 1 PATCH BY TOPICAL ROUTE ONCE DAILY (MAY WEAR UP TO 12HOURS.)     losartan-hydrochlorothiazide (HYZAAR) 100-12.5 MG tablet TAKE 1 TABLET BY MOUTH DAILY. REPORTED  ON 01/23/2016 90 tablet 1   meclizine (ANTIVERT) 25 MG tablet Take 1 tablet (25 mg total) by mouth 3 (three) times daily as needed (vertigo). Reported on 01/23/2016 30 tablet 1   Multiple Vitamins-Minerals (ALIVE WOMENS 50+) TABS Take 1 tablet by mouth daily.     psyllium (REGULOID) 0.52 g capsule Take 0.52 g by mouth daily.     raloxifene (EVISTA) 60 MG tablet TAKE 1 TABLET BY MOUTH EVERY DAY 90 tablet 3   tiZANidine (ZANAFLEX) 2 MG tablet tizanidine 2 mg tablet  TAKE 1 TABLET BY MOUTH EVERY DAY     traMADol (ULTRAM) 50 MG tablet Take 50 mg by mouth every 6 (six) hours as needed (for pain).     Turmeric 500 MG CAPS Take by mouth.     ondansetron (ZOFRAN-ODT) 8 MG disintegrating tablet Take 8 mg by mouth every 8 (eight) hours as needed for nausea or vomiting. (Patient not taking: Reported on 11/13/2021)     Probiotic Product (ALIGN) 4 MG CAPS Take 4 mg by mouth at bedtime. (Patient not taking: Reported on 11/22/2021)     benzonatate (TESSALON) 100 MG capsule Take 1 capsule (100 mg total) by mouth 2 (two) times daily as needed for cough. 20 capsule 0   No facility-administered medications prior to visit.    Allergies  Allergen Reactions   Elemental Sulfur Hives    ROS Review of Systems  Constitutional: Negative.   HENT: Negative.    Eyes:  Negative for photophobia and visual disturbance.  Respiratory:  Positive for cough. Negative for shortness of breath and wheezing.   Cardiovascular:   Negative for chest pain and palpitations.  Gastrointestinal:  Negative for abdominal pain, nausea and vomiting.  Musculoskeletal:  Positive for back pain. Negative for arthralgias and myalgias.  Neurological:  Negative for speech difficulty and weakness.  Psychiatric/Behavioral: Negative.       Objective:    Physical Exam Vitals and nursing note reviewed.  Constitutional:      General: She is not in acute distress.    Appearance: Normal appearance. She is not ill-appearing, toxic-appearing or diaphoretic.  HENT:     Head: Normocephalic and atraumatic.     Right Ear: External ear normal.     Left Ear: External ear normal.     Mouth/Throat:     Mouth: Mucous membranes are moist.     Pharynx: Oropharynx is clear. No oropharyngeal exudate or posterior oropharyngeal erythema.  Eyes:     General: No scleral icterus.       Right eye: No discharge.        Left eye: No discharge.     Extraocular Movements: Extraocular movements intact.     Conjunctiva/sclera: Conjunctivae normal.     Pupils: Pupils are equal, round, and reactive to light.  Neck:     Vascular: No carotid bruit.  Cardiovascular:     Rate and Rhythm: Normal rate and regular rhythm.  Pulmonary:     Effort: Pulmonary effort is normal.     Breath sounds: Normal breath sounds.  Abdominal:     General: Bowel sounds are normal.  Musculoskeletal:     Cervical back: No rigidity or tenderness.  Lymphadenopathy:     Cervical: No cervical adenopathy.  Skin:    General: Skin is warm and dry.  Neurological:     Mental Status: She is alert and oriented to person, place, and time.  Psychiatric:        Mood and Affect: Mood normal.  Behavior: Behavior normal.    BP 140/76 (BP Location: Right Arm, Patient Position: Sitting, Cuff Size: Normal)    Pulse 65    Temp (!) 97.1 F (36.2 C) (Temporal)    Ht 5' 2" (1.575 m)    Wt 140 lb (63.5 kg)    LMP 06/24/2008    SpO2 96%    BMI 25.61 kg/m  Wt Readings from Last 3  Encounters:  11/22/21 140 lb (63.5 kg)  10/27/21 140 lb 9.6 oz (63.8 kg)  10/13/21 139 lb 12.4 oz (63.4 kg)     Health Maintenance Due  Topic Date Due   Hepatitis C Screening  Never done   Pneumonia Vaccine 28+ Years old (2 - PPSV23 if available, else PCV20) 03/31/2021    There are no preventive care reminders to display for this patient.  No results found for: TSH Lab Results  Component Value Date   WBC 5.0 10/13/2021   HGB 13.5 10/13/2021   HCT 38.3 10/13/2021   MCV 91.2 10/13/2021   PLT 233 10/13/2021   Lab Results  Component Value Date   NA 139 10/13/2021   K 3.5 10/13/2021   CO2 29 10/13/2021   GLUCOSE 98 10/13/2021   BUN 23 10/13/2021   CREATININE 0.72 10/13/2021   BILITOT 0.5 10/13/2021   ALKPHOS 59 10/13/2021   AST 29 10/13/2021   ALT 26 10/13/2021   PROT 7.0 10/13/2021   ALBUMIN 4.4 10/13/2021   CALCIUM 9.6 10/13/2021   ANIONGAP 8 10/13/2021   GFR 90.87 02/13/2021   Lab Results  Component Value Date   CHOL 207 (H) 02/13/2021   Lab Results  Component Value Date   HDL 77.00 02/13/2021   Lab Results  Component Value Date   LDLCALC 116 (H) 02/13/2021   Lab Results  Component Value Date   TRIG 66.0 02/13/2021   Lab Results  Component Value Date   CHOLHDL 3 02/13/2021   No results found for: HGBA1C    Assessment & Plan:   Problem List Items Addressed This Visit       Cardiovascular and Mediastinum   Essential hypertension - Primary   Relevant Orders   CBC   Comp Met (CMET)   Urinalysis, Routine w reflex microscopic     Other   Elevated LDL cholesterol level   Relevant Orders   Lipid Profile   Other Visit Diagnoses     Chronic low back pain without sciatica, unspecified back pain laterality       Need for vaccination against Streptococcus pneumoniae       Relevant Orders   Pneumococcal conjugate vaccine 20-valent (Prevnar 20)       No orders of the defined types were placed in this encounter.   Follow-up: Return in about  6 months (around 05/25/2022).  We will continue atorvastatin and Hyzaar.  Prevnar 20 today.  Information was given on PACs and we discussed prevention as well.  Libby Maw, MD

## 2021-12-06 ENCOUNTER — Ambulatory Visit (HOSPITAL_BASED_OUTPATIENT_CLINIC_OR_DEPARTMENT_OTHER): Payer: BC Managed Care – PPO | Admitting: Obstetrics & Gynecology

## 2021-12-14 DIAGNOSIS — Z1231 Encounter for screening mammogram for malignant neoplasm of breast: Secondary | ICD-10-CM | POA: Diagnosis not present

## 2021-12-14 LAB — HM MAMMOGRAPHY

## 2021-12-15 ENCOUNTER — Encounter (HOSPITAL_BASED_OUTPATIENT_CLINIC_OR_DEPARTMENT_OTHER): Payer: Self-pay | Admitting: *Deleted

## 2022-01-01 DIAGNOSIS — S46012A Strain of muscle(s) and tendon(s) of the rotator cuff of left shoulder, initial encounter: Secondary | ICD-10-CM | POA: Diagnosis not present

## 2022-01-04 ENCOUNTER — Telehealth: Payer: Self-pay | Admitting: Family Medicine

## 2022-01-04 NOTE — Telephone Encounter (Signed)
Pt is wanting to transfer her care from Dr. Ethelene Hal to Ander Purpura, if that's OK just let me know. ?

## 2022-02-06 NOTE — Progress Notes (Signed)
[facility] Note  02/12/2022     CHIEF COMPLAINT Patient presents for Retina Follow Up   HISTORY OF PRESENT ILLNESS: Melissa Mcclure is a 66 y.o. female who presents to the clinic today for:   HPI     Retina Follow Up   Patient presents with  Other.  In both eyes.  This started 3 months ago.  I, the attending physician,  performed the HPI with the patient and updated documentation appropriately.        Comments   Patient here for 3 months retina follow up for retinoschisis OU. Patient states vision doing fine. No eye pain.       Last edited by Bernarda Caffey, MD on 02/13/2022 11:49 PM.    Pt states vision is stable, she has not noticed any fol or new floaters   Referring physician: Lisabeth Pick, MD [address] [place],  [place] [address]  HISTORICAL INFORMATION:   Selected notes from the MEDICAL RECORD NUMBER Referred by Dr. Lucianne Lei for concern of retinoschisis OS LEE:  Ocular Hx- PMH-    CURRENT MEDICATIONS: No current outpatient medications on file. (Ophthalmic Drugs)   No current facility-administered medications for this visit. (Ophthalmic Drugs)   Current Outpatient Medications (Other)  Medication Sig   acetaminophen (TYLENOL) 500 MG tablet Take 1,000 mg by mouth 2 (two) times daily as needed for mild pain.   Ascorbic Acid (VITAMIN C WITH ROSE HIPS) 1000 MG tablet Take 1,000 mg by mouth daily.   atorvastatin (LIPITOR) 10 MG tablet Take 1 tablet (10 mg total) by mouth daily.   cetirizine (ZYRTEC) 10 MG tablet Take 10 mg by mouth daily.   losartan-hydrochlorothiazide (HYZAAR) 100-12.5 MG tablet TAKE 1 TABLET BY MOUTH DAILY. REPORTED ON 01/23/2016   meclizine (ANTIVERT) 25 MG tablet Take 1 tablet (25 mg total) by mouth 3 (three) times daily as needed (vertigo). Reported on 01/23/2016   Multiple Vitamins-Minerals (ALIVE WOMENS 50+) TABS Take 1 tablet by mouth daily.   raloxifene (EVISTA) 60 MG tablet TAKE 1 TABLET BY MOUTH EVERY DAY    traMADol (ULTRAM) 50 MG tablet Take 50 mg by mouth every 6 (six) hours as needed (for pain).   Turmeric 500 MG CAPS Take by mouth.   fluorouracil (EFUDEX) 5 % cream fluorouracil 5 % topical cream  APPLY 1 A SMALL AMOUNT TO AFFECTED AREA TWICE A DAY APPLY TWICE DAILY TO AFFECTED AREAS X 2 WEEKS (Patient not taking: Reported on 02/12/2022)   lidocaine (LIDODERM) 5 % lidocaine 5 % topical patch  APPLY 1 PATCH BY TOPICAL ROUTE ONCE DAILY (MAY WEAR UP TO 12HOURS.) (Patient not taking: Reported on 02/12/2022)   ondansetron (ZOFRAN-ODT) 8 MG disintegrating tablet Take 8 mg by mouth every 8 (eight) hours as needed for nausea or vomiting. (Patient not taking: Reported on 11/13/2021)   Probiotic Product (ALIGN) 4 MG CAPS Take 4 mg by mouth at bedtime. (Patient not taking: Reported on 11/22/2021)   psyllium (REGULOID) 0.52 g capsule Take 0.52 g by mouth daily. (Patient not taking: Reported on 02/12/2022)   tiZANidine (ZANAFLEX) 2 MG tablet tizanidine 2 mg tablet  TAKE 1 TABLET BY MOUTH EVERY DAY (Patient not taking: Reported on 02/12/2022)   No current facility-administered medications for this visit. (Other)   REVIEW OF SYSTEMS: ROS   Positive for: Cardiovascular, Eyes Negative for: Constitutional, Gastrointestinal, Neurological, Skin, Genitourinary, Musculoskeletal, HENT, Endocrine, Respiratory, Psychiatric, Allergic/Imm, Heme/Lymph Last edited by Theodore Demark, COA on 02/12/2022  1:25 PM.     ALLERGIES Allergies  Allergen Reactions   Elemental Sulfur Hives   PAST MEDICAL HISTORY Past Medical History:  Diagnosis Date   Arthritis    Cancer ([place]) 04/2010   melanoma-left knee, right arm   Chronic back pain    Complication of anesthesia    Ductal carcinoma in situ (DCIS) of right breast 10/2017   Family history of adverse reaction to anesthesia    mother has PONV   Family history of colon cancer    Family history of melanoma    Family history of ovarian cancer    Genetic testing 12/23/2017    STAT Breast panel with reflex to Multi-Cancer panel (83 genes) @ [facility] - No pathogenic mutations detected   GERD (gastroesophageal reflux disease)    H/O hiatal hernia    Heart murmur    no problems   History of hand surgery    Hypertension    PONV (postoperative nausea and vomiting)    "zofran works great   Seasonal allergies    Vertigo    Past Surgical History:  Procedure Laterality Date   ANTERIOR LAT LUMBAR FUSION Right 04/17/2013   Procedure: ANTERIOR LATERAL LUMBAR FUSION 3 LEVELS;  Surgeon: Erline Levine, MD;  Location: [facility];  Service: Neurosurgery;  Laterality: Right;  Right Lumbar Two-Three Lumbar Three-Four Lumbar Four-Five  Anterolateral Fusion   BACK SURGERY     BREAST LUMPECTOMY WITH RADIOACTIVE SEED LOCALIZATION Right 12/25/2017   Procedure: RIGHT BREAST LUMPECTOMY WITH RADIOACTIVE SEED LOCALIZATION;  Surgeon: Fanny Skates, MD;  Location: [facility];  Service: General;  Laterality: Right;   [place]   COLONOSCOPY     COLONOSCOPY WITH PROPOFOL N/A 06/23/2018   Procedure: COLONOSCOPY WITH PROPOFOL;  Surgeon: Clarene Essex, MD;  Location: [facility];  Service: Endoscopy;  Laterality: N/A;   CYSTOSCOPY N/A 06/26/2016   Procedure: CYSTOSCOPY;  Surgeon: Megan Salon, MD;  Location: [facility];  Service: Gynecology;  Laterality: N/A;   DILATATION & CURETTAGE/HYSTEROSCOPY WITH MYOSURE N/A 01/24/2016   Procedure: DILATATION & CURETTAGE/HYSTEROSCOPY WITH MYOSURE;  Surgeon: Megan Salon, MD;  Location: [facility];  Service: Gynecology;  Laterality: N/A;   FOOT SURGERY Right    early 20s-bone spur removal   HAND SURGERY Bilateral    for basal joint arthritis   LAPAROSCOPIC HYSTERECTOMY Bilateral 06/26/2016   Procedure: HYSTERECTOMY TOTAL LAPAROSCOPIC;  Surgeon: Megan Salon, MD;  Location: [facility];  Service: Gynecology;  Laterality: Bilateral;   LUMBAR PERCUTANEOUS PEDICLE SCREW 3 LEVEL N/A 04/17/2013    Procedure: LUMBAR PERCUTANEOUS PEDICLE SCREW 3 LEVEL;  Surgeon: Erline Levine, MD;  Location: [facility];  Service: Neurosurgery;  Laterality: N/A;  Decompression and Posterior Lumbar Interbody Fusion of Lumbar Five-Sacral One, Percutaneous Screws Lumbar Two through Sacral One   MELANOMA EXCISION     knee 08 lft. elbow 11rt   SALPINGOOPHORECTOMY Bilateral 06/26/2016   Procedure: SALPINGO OOPHORECTOMY;  Surgeon: Megan Salon, MD;  Location: [facility];  Service: Gynecology;  Laterality: Bilateral;   SHOULDER SURGERY Right 04/28/2015   STRABISMUS SURGERY Bilateral 07/29/2020   Procedure: BILATERAL STRABISMUS REPAIR;  Surgeon: Everitt Amber, MD;  Location: [facility];  Service: Ophthalmology;  Laterality: Bilateral;   TUBAL LIGATION  1986   [facility]  06/17/2007   and left groin lymph node   XI ROBOTIC ASSISTED HIATAL HERNIA REPAIR N/A 04/04/2021   Procedure: ROBOTIC  HIATAL HERNIA REPAIR WITH FUNDOPLICATION;  Surgeon: Ralene Ok, MD;  Location: [facility];  Service: General;  Laterality: N/A;   FAMILY HISTORY Family History  Problem Relation Age of Onset   Diabetes Father        borderline   Melanoma Father        dx 74s/60s; deceased 14s   Congestive Heart Failure Father    Colon cancer Mother        dx 57s; deceased 9   Hypertension Mother    Osteoporosis Mother    Ovarian cancer Maternal Grandmother        deceased 24s   Heart disease Paternal Grandfather    Melanoma Daughter        on leg; dx 88s   SOCIAL HISTORY Social History   Tobacco Use   Smoking status: Never   Smokeless tobacco: Never  Vaping Use   Vaping Use: Never used  Substance Use Topics   Alcohol use: Yes    Alcohol/week: 0.0 - 1.0 standard drinks   Drug use: No       OPHTHALMIC EXAM:  Base Eye Exam     Visual Acuity (Snellen - Linear)       Right Left   Dist [place] 20/25 -1 20/20   Dist ph [place] 20/20          Tonometry (Tonopen, 1:23 PM)       Right Left   Pressure 12 12          Pupils       Dark Light Shape React APD   Right 2 1 Round Slow None   Left 2 1 Round Slow None         Visual Fields (Counting fingers)       Left Right    Full Full         Extraocular Movement       Right Left    Full, Ortho Full, Ortho         Neuro/Psych     Oriented x3: Yes   Mood/Affect: Normal         Dilation     Both eyes: 1.0% Mydriacyl, 2.5% Phenylephrine @ 1:23 PM           Slit Lamp and Fundus Exam     Slit Lamp Exam       Right Left   Lids/Lashes Dermatochalasis - upper lid Dermatochalasis - upper lid   Conjunctiva/Sclera White and quiet Mild nasal and temporal pinguecula   Cornea 1-2+ inferior Punctate epithelial erosions, mild tear film debris trace Punctate epithelial erosions, mild tear film debris   Anterior Chamber deep, clear, narrow temporal angle deep, clear, narrow temporal angle   Iris Round and dilated Round and dilated   Lens 2+ Nuclear sclerosis, 2+ Cortical cataract, ?early pseudoexfoliation on inferior capsule 2+ Nuclear sclerosis, 2+ Cortical cataract   Anterior Vitreous Vitreous syneresis Vitreous syneresis, Posterior vitreous detachment, vitreous condensations         Fundus Exam       Right Left   Disc Pink and Sharp, +cupping, mild temporal PPA Pink and Sharp, +cupping, mild temporal PPA   C/D Ratio 0.7 0.6   Macula Flat, Good foveal reflex, mild RPE mottling, No heme or edema Flat, Good foveal reflex, mild RPE mottling, No heme or edema   Vessels mild attenuation mild attenuation   Periphery Attached, focal CR atrophy at 0900, shallow schisis ST periphery, no RT/RD Attached, bullous schisis from 0130-0300, no  RT/RD           IMAGING AND PROCEDURES  Imaging and Procedures for 02/12/2022  OCT, Retina - OU - Both Eyes       Right Eye Quality was good. Central Foveal Thickness: 277. Progression has been stable. Findings include normal foveal contour, no IRF, no SRF (Partial PVD, focal multilaminer  schisis IT periphery caught on widefield).   Left Eye Quality was good. Central Foveal Thickness: 286. Progression has been stable. Findings include normal foveal contour, no IRF, no SRF (Bullous retinoschisis from 0100-0230 caught on widefield -- stable).   Notes *Images captured and stored on drive  Diagnosis / Impression:  NFP, no IRF/SRF OU OD: Partial PVD, focal multilaminer schisis IT periphery caught on widefield OS: Bullous retinoschisis from 0100-0230 caught on widefield -- stable from prior  Clinical management:  See below  Abbreviations: NFP - Normal foveal profile. CME - cystoid macular edema. PED - pigment epithelial detachment. IRF - intraretinal fluid. SRF - subretinal fluid. EZ - ellipsoid zone. ERM - epiretinal membrane. ORA - outer retinal atrophy. ORT - outer retinal tubulation. SRHM - subretinal hyper-reflective material. IRHM - intraretinal hyper-reflective material            ASSESSMENT/PLAN:    ICD-10-CM   1. Bilateral retinoschisis  H33.103 OCT, Retina - OU - Both Eyes    2. Essential hypertension  I10     3. Hypertensive retinopathy of both eyes  H35.033 OCT, Retina - OU - Both Eyes    4. History of strabismus surgery  Z98.890     5. Combined forms of age-related cataract of both eyes  H25.813      1. Retinoschisis OU  - OD: shallow schisis ST periphery - OS: bullous schisis located from 0100-0230 - both stable without progression; no RT/RD OU - discussed findings, prognosis, treatment options - no retinal or ophthalmic interventions indicated or recommended at this time - recommend monitoring - f/u 6 months, DFE, OCT -- scan through schisis  2,3. Hypertensive retinopathy OU - discussed importance of tight BP control - monitor  4. History of strabismus surgery  - pt reports history of XT  - s/p strabismus Sx--Dr. Annamaria Boots (11.05.21)  5. Mixed Cataract OU - The symptoms of cataract, surgical options, and treatments and risks were discussed  with patient. - discussed diagnosis and progression - not yet visually significant - monitor for now   Ophthalmic Meds Ordered this visit:  No orders of the defined types were placed in this encounter.    Return in about 6 months (around 08/15/2022) for f/u retinoschisis OU, DFE, OCT.  There are no Patient Instructions on file for this visit.   Explained the diagnoses, plan, and follow up with the patient and they expressed understanding.  Patient expressed understanding of the importance of proper follow up care.   This document serves as a record of services personally performed by Gardiner Sleeper, MD, PhD. It was created on their behalf by Roselee Nova, COMT. The creation of this record is the provider's dictation and/or activities during the visit.  Electronically signed by: Roselee Nova, COMT 02/13/22 11:52 PM  This document serves as a record of services personally performed by Gardiner Sleeper, MD, PhD. It was created on their behalf by San Jetty. Owens Shark, OA an ophthalmic technician. The creation of this record is the provider's dictation and/or activities during the visit.    Electronically signed by: San Jetty. [place], [place] 05.22.2023 11:52 PM  Gardiner Sleeper,  M.D., Ph.D. Diseases & Surgery of the Retina and [place]  I have reviewed the above documentation for accuracy and completeness, and I agree with the above. Gardiner Sleeper, M.D., Ph.D. 02/13/22 11:52 PM  Abbreviations: M myopia (nearsighted); A astigmatism; H hyperopia (farsighted); P presbyopia; Mrx spectacle prescription;  CTL contact lenses; OD right eye; OS left eye; OU both eyes  XT exotropia; ET esotropia; PEK punctate epithelial keratitis; PEE punctate epithelial erosions; DES dry eye syndrome; MGD meibomian gland dysfunction; ATs artificial tears; PFAT's preservative free artificial tears; [place] nuclear sclerotic cataract; PSC posterior subcapsular cataract; ERM epi-retinal membrane;  PVD posterior vitreous detachment; RD retinal detachment; DM diabetes mellitus; DR diabetic retinopathy; NPDR non-proliferative diabetic retinopathy; PDR proliferative diabetic retinopathy; CSME clinically significant macular edema; DME diabetic macular edema; dbh dot blot hemorrhages; CWS cotton wool spot; POAG primary open angle glaucoma; C/D cup-to-disc ratio; HVF humphrey visual field; GVF goldmann visual field; OCT optical coherence tomography; IOP intraocular pressure; BRVO Branch retinal vein occlusion; CRVO central retinal vein occlusion; CRAO central retinal artery occlusion; BRAO branch retinal artery occlusion; RT retinal tear; SB scleral buckle; PPV pars plana vitrectomy; VH Vitreous hemorrhage; PRP panretinal laser photocoagulation; IVK intravitreal kenalog; VMT vitreomacular traction; MH Macular hole;  NVD neovascularization of the disc; NVE neovascularization elsewhere; AREDS age related eye disease study; ARMD age related macular degeneration; POAG primary open angle glaucoma; EBMD epithelial/anterior basement membrane dystrophy; ACIOL anterior chamber intraocular lens; IOL intraocular lens; PCIOL posterior chamber intraocular lens; Phaco/IOL phacoemulsification with intraocular lens placement; [place] photorefractive keratectomy; LASIK laser assisted in situ keratomileusis; HTN hypertension; DM diabetes mellitus; COPD chronic obstructive pulmonary disease

## 2022-02-12 ENCOUNTER — Encounter (INDEPENDENT_AMBULATORY_CARE_PROVIDER_SITE_OTHER): Payer: Self-pay | Admitting: Ophthalmology

## 2022-02-12 ENCOUNTER — Ambulatory Visit (INDEPENDENT_AMBULATORY_CARE_PROVIDER_SITE_OTHER): Payer: BC Managed Care – PPO | Admitting: Ophthalmology

## 2022-02-12 DIAGNOSIS — H25813 Combined forms of age-related cataract, bilateral: Secondary | ICD-10-CM

## 2022-02-12 DIAGNOSIS — H33103 Unspecified retinoschisis, bilateral: Secondary | ICD-10-CM

## 2022-02-12 DIAGNOSIS — H35033 Hypertensive retinopathy, bilateral: Secondary | ICD-10-CM

## 2022-02-12 DIAGNOSIS — I10 Essential (primary) hypertension: Secondary | ICD-10-CM

## 2022-02-12 DIAGNOSIS — Z9889 Other specified postprocedural states: Secondary | ICD-10-CM

## 2022-02-13 ENCOUNTER — Encounter (INDEPENDENT_AMBULATORY_CARE_PROVIDER_SITE_OTHER): Payer: Self-pay | Admitting: Ophthalmology

## 2022-02-14 ENCOUNTER — Other Ambulatory Visit: Payer: Self-pay | Admitting: Family Medicine

## 2022-02-14 DIAGNOSIS — I10 Essential (primary) hypertension: Secondary | ICD-10-CM

## 2022-02-26 DIAGNOSIS — S46012A Strain of muscle(s) and tendon(s) of the rotator cuff of left shoulder, initial encounter: Secondary | ICD-10-CM | POA: Diagnosis not present

## 2022-03-05 DIAGNOSIS — M25512 Pain in left shoulder: Secondary | ICD-10-CM | POA: Diagnosis not present

## 2022-03-12 DIAGNOSIS — Z85820 Personal history of malignant melanoma of skin: Secondary | ICD-10-CM | POA: Diagnosis not present

## 2022-03-12 DIAGNOSIS — L565 Disseminated superficial actinic porokeratosis (DSAP): Secondary | ICD-10-CM | POA: Diagnosis not present

## 2022-03-12 DIAGNOSIS — L570 Actinic keratosis: Secondary | ICD-10-CM | POA: Diagnosis not present

## 2022-03-13 DIAGNOSIS — S46012D Strain of muscle(s) and tendon(s) of the rotator cuff of left shoulder, subsequent encounter: Secondary | ICD-10-CM | POA: Diagnosis not present

## 2022-05-01 DIAGNOSIS — M5136 Other intervertebral disc degeneration, lumbar region: Secondary | ICD-10-CM | POA: Diagnosis not present

## 2022-05-16 DIAGNOSIS — M545 Low back pain, unspecified: Secondary | ICD-10-CM | POA: Diagnosis not present

## 2022-05-16 DIAGNOSIS — M5136 Other intervertebral disc degeneration, lumbar region: Secondary | ICD-10-CM | POA: Diagnosis not present

## 2022-05-16 DIAGNOSIS — M5126 Other intervertebral disc displacement, lumbar region: Secondary | ICD-10-CM | POA: Diagnosis not present

## 2022-05-22 NOTE — Progress Notes (Unsigned)
[place]   [phone] [phone]   Clinic Follow up Note   Patient Care Team: Melissa Dancer, NP as PCP - General (Internal Medicine) 05/23/2022  CHIEF COMPLAINT: Follow up R breast DCIS  SUMMARY OF ONCOLOGIC HISTORY: Oncology History Overview Note  Cancer Staging Ductal carcinoma in situ (DCIS) of right breast Staging form: Breast, AJCC 8th Edition - Clinical stage from 11/19/2017: Stage 0 (cTis (DCIS), cN0, cM0, ER: Positive, PR: Positive, HER2: Not assessed ) - Signed by Melissa Merle, MD on 11/26/2017     Ductal carcinoma in situ (DCIS) of right breast  11/07/2017 Mammogram    Diagnostic Mammogram Right breast 11/07/17 IMPRESSION:  The results of the exam were reviewed with the patient.  The 3 cm are of grouped branching linear calcifications in the right breast lower inner quadrant middle depth are at moderate concern but not classic for malignancy.  A Stereotactic biopsy is recommended.   11/19/2017 Initial Biopsy   Diagnosis 11/19/17 Breast, right, needle core biopsy - DUCTAL CARCINOMA IN SITU WITH CALCIFICATIONS. - SEE COMMENT.    11/19/2017 Receptors her2   Estrogen Receptor: 90%, POSITIVE, STRONG STAINING INTENSITY Progesterone Receptor: 25%, POSITIVE, STRONG STAINING INTENSITY    11/19/2017 Initial Diagnosis   Ductal carcinoma in situ (DCIS) of right breast   12/23/2017 Genetic Testing   Testing did not reveal a pathogenic mutation in any of the genes analyzed. A copy of the genetic test report will be scanned into Epic under the Media tab.   The genes analyzed were the 83 genes on [facility]'s Multi-Cancer panel (ALK, APC, ATM, AXIN2, BAP1, BARD1, BLM, BMPR1A, BRCA1, BRCA2, BRIP1, CASR, CDC73, CDH1, CDK4, CDKN1B, CDKN1C, CDKN2A, CEBPA, CHEK2, CTNNA1, DICER1, DIS3L2, EGFR, EPCAM, FH, FLCN, GATA2, GPC3, GREM1, HOXB13, HRAS, KIT, MAX, MEN1, MET, MITF, MLH1, MSH2, MSH3, MSH6, MUTYH, NBN, NF1, NF2, NTHL1, PALB2, PDGFRA, PHOX2B, PMS2,  POLD1, POLE, POT1, PRKAR1A, PTCH1, PTEN, RAD50, RAD51C, RAD51D, RB1, RECQL4, RET, RUNX1, SDHA, SDHAF2, SDHB, SDHC, SDHD, SMAD4, SMARCA4, SMARCB1, SMARCE1, STK11, SUFU, TERC, TERT, TMEM127, TP53, TSC1, TSC2, VHL, WRN, WT1).   12/25/2017 Pathology Results   Diagnosis, 12/25/2017 Breast, lumpectomy, Right w/seed - DUCTAL CARCINOMA IN SITU WITH CALCIFICATIONS, LOW GRADE, SPANNING 1.3 CM. - THE SURGICAL RESECTION MARGINS ARE NEGATIVE FOR CARCINOMA   01/20/2018 - 02/14/2018 Radiation Therapy   Radiation treatment dates:   01/20/2018 - 02/14/2018   Site/dose:   The patient initially received a dose of 42.5 Gy in 17 fractions to the right breast using whole-breast tangent fields. This was delivered using a 3-D conformal technique. The patient then received a boost to the seroma. This delivered an additional 7.5 Gy in 3 fractions using a 3 field photon technique due to the depth of the seroma. The total dose was 50 Gy.   Narrative: The patient tolerated radiation treatment relatively well.   The patient had some expected skin irritation as she progressed during treatment. Moist desquamation was not present at the end of treatment.    02/2018 -  Anti-estrogen oral therapy   Raloxifene 75m once daily starting in 02/2018     CURRENT THERAPY: Raloxifene starting 02/2018, held for 2 months in 2021 due to hot flashes; plan to complete summer 2024  INTERVAL HISTORY: [name] for follow up as scheduled. Last seen by Dr. FBurr Medico9/1/22. Mammogram 12/14/21 is negative, shows breast density cat C.  She is doing well without changes in her overall health.  She plans to see a new PCP soon.  Hot flashes and mild joint aches are stable, not limiting function and tolerable.  She denies concerns in her breast such as new lump/mass, nipple discharge or inversion, or skin change.  All other systems were reviewed with the patient and are negative.  MEDICAL HISTORY:  Past Medical History:  Diagnosis Date   Arthritis     Cancer ([facility]) 04/2010   melanoma-left knee, right arm   Chronic back pain    Complication of anesthesia    Ductal carcinoma in situ (DCIS) of right breast 10/2017   Family history of adverse reaction to anesthesia    mother has PONV   Family history of colon cancer    Family history of melanoma    Family history of ovarian cancer    Genetic testing 12/23/2017   STAT Breast panel with reflex to Multi-Cancer panel (83 genes) @ [facility] - No pathogenic mutations detected   GERD (gastroesophageal reflux disease)    H/O hiatal hernia    Heart murmur    no problems   History of hand surgery    Hypertension    PONV (postoperative nausea and vomiting)    "zofran works great   Seasonal allergies    Vertigo     SURGICAL HISTORY: Past Surgical History:  Procedure Laterality Date   ANTERIOR LAT LUMBAR FUSION Right 04/17/2013   Procedure: ANTERIOR LATERAL LUMBAR FUSION 3 LEVELS;  Surgeon: Melissa Levine, MD;  Location: [facility];  Service: Neurosurgery;  Laterality: Right;  Right Lumbar Two-Three Lumbar Three-Four Lumbar Four-Five  Anterolateral Fusion   BACK SURGERY     BREAST LUMPECTOMY WITH RADIOACTIVE SEED LOCALIZATION Right 12/25/2017   Procedure: RIGHT BREAST LUMPECTOMY WITH RADIOACTIVE SEED LOCALIZATION;  Surgeon: Melissa Skates, MD;  Location: [facility];  Service: General;  Laterality: Right;   [place]   COLONOSCOPY     COLONOSCOPY WITH PROPOFOL N/A 06/23/2018   Procedure: COLONOSCOPY WITH PROPOFOL;  Surgeon: Clarene Essex, MD;  Location: [facility];  Service: Endoscopy;  Laterality: N/A;   CYSTOSCOPY N/A 06/26/2016   Procedure: CYSTOSCOPY;  Surgeon: Megan Salon, MD;  Location: [facility];  Service: Gynecology;  Laterality: N/A;   DILATATION & CURETTAGE/HYSTEROSCOPY WITH MYOSURE N/A 01/24/2016   Procedure: DILATATION & CURETTAGE/HYSTEROSCOPY WITH MYOSURE;  Surgeon: Megan Salon, MD;  Location: [facility];   Service: Gynecology;  Laterality: N/A;   FOOT SURGERY Right    early 20s-bone spur removal   HAND SURGERY Bilateral    for basal joint arthritis   LAPAROSCOPIC HYSTERECTOMY Bilateral 06/26/2016   Procedure: HYSTERECTOMY TOTAL LAPAROSCOPIC;  Surgeon: Megan Salon, MD;  Location: [facility];  Service: Gynecology;  Laterality: Bilateral;   LUMBAR PERCUTANEOUS PEDICLE SCREW 3 LEVEL N/A 04/17/2013   Procedure: LUMBAR PERCUTANEOUS PEDICLE SCREW 3 LEVEL;  Surgeon: Melissa Levine, MD;  Location: [facility];  Service: Neurosurgery;  Laterality: N/A;  Decompression and Posterior Lumbar Interbody Fusion of Lumbar Five-Sacral One, Percutaneous Screws Lumbar Two through Sacral One   MELANOMA EXCISION     knee 08 lft. elbow 11rt   SALPINGOOPHORECTOMY Bilateral 06/26/2016   Procedure: SALPINGO OOPHORECTOMY;  Surgeon: Megan Salon, MD;  Location: [facility];  Service: Gynecology;  Laterality: Bilateral;   SHOULDER SURGERY Right 04/28/2015   STRABISMUS SURGERY Bilateral 07/29/2020   Procedure: BILATERAL STRABISMUS REPAIR;  Surgeon: Everitt Amber, MD;  Location: [facility];  Service: Ophthalmology;  Laterality: Bilateral;   TUBAL  LIGATION  1986   [facility]  06/17/2007   and left groin lymph node   XI ROBOTIC ASSISTED HIATAL HERNIA REPAIR N/A 04/04/2021   Procedure: ROBOTIC HIATAL HERNIA REPAIR WITH FUNDOPLICATION;  Surgeon: Ralene Ok, MD;  Location: [facility];  Service: General;  Laterality: N/A;    I have reviewed the social history and family history with the patient and they are unchanged from previous note.  ALLERGIES:  is allergic to elemental sulfur.  MEDICATIONS:  Current Outpatient Medications  Medication Sig Dispense Refill   acetaminophen (TYLENOL) 500 MG tablet Take 1,000 mg by mouth 2 (two) times daily as needed for mild pain.     Ascorbic Acid (VITAMIN C WITH ROSE HIPS) 1000 MG tablet Take 1,000 mg by mouth daily.     atorvastatin (LIPITOR) 10 MG tablet Take 1 tablet (10 mg total)  by mouth daily. 90 tablet 3   cetirizine (ZYRTEC) 10 MG tablet Take 10 mg by mouth daily.     diclofenac (VOLTAREN) 75 MG EC tablet TK 1 T PO BID PRN     fluorouracil (EFUDEX) 5 % cream fluorouracil 5 % topical cream  APPLY 1 A SMALL AMOUNT TO AFFECTED AREA TWICE A DAY APPLY TWICE DAILY TO AFFECTED AREAS X 2 WEEKS (Patient not taking: Reported on 02/12/2022)     lidocaine (LIDODERM) 5 % lidocaine 5 % topical patch  APPLY 1 PATCH BY TOPICAL ROUTE ONCE DAILY (MAY WEAR UP TO 12HOURS.) (Patient not taking: Reported on 02/12/2022)     losartan-hydrochlorothiazide (HYZAAR) 100-12.5 MG tablet TAKE 1 TABLET BY MOUTH DAILY. REPORTED ON 01/23/2016 90 tablet 1   meclizine (ANTIVERT) 25 MG tablet Take 1 tablet (25 mg total) by mouth 3 (three) times daily as needed (vertigo). Reported on 01/23/2016 30 tablet 1   Multiple Vitamins-Minerals (ALIVE WOMENS 50+) TABS Take 1 tablet by mouth daily.     ondansetron (ZOFRAN-ODT) 8 MG disintegrating tablet Take 8 mg by mouth every 8 (eight) hours as needed for nausea or vomiting. (Patient not taking: Reported on 11/13/2021)     Probiotic Product (ALIGN) 4 MG CAPS Take 4 mg by mouth at bedtime. (Patient not taking: Reported on 11/22/2021)     psyllium (REGULOID) 0.52 g capsule Take 0.52 g by mouth daily. (Patient not taking: Reported on 02/12/2022)     raloxifene (EVISTA) 60 MG tablet TAKE 1 TABLET BY MOUTH EVERY DAY 90 tablet 3   tiZANidine (ZANAFLEX) 2 MG tablet tizanidine 2 mg tablet  TAKE 1 TABLET BY MOUTH EVERY DAY (Patient not taking: Reported on 02/12/2022)     traMADol (ULTRAM) 50 MG tablet Take 50 mg by mouth every 6 (six) hours as needed (for pain).     Turmeric 500 MG CAPS Take by mouth.     No current facility-administered medications for this visit.    PHYSICAL EXAMINATION:  Vitals:   05/23/22 1019  BP: (!) 149/85  Pulse: 90  Resp: 15  Temp: 98.1 F (36.7 C)  SpO2: 99%   Filed Weights   05/23/22 1019  Weight: 135 lb 4.8 oz (61.4 kg)    GENERAL:alert,  no distress and comfortable SKIN: Scattered rash to bilateral legs EYES:  sclera clear NECK: Without mass LYMPH:  no palpable cervical or supraclavicular lymphadenopathy LUNGS:  normal breathing effort HEART:  no lower extremity edema NEURO: alert & oriented x 3 with fluent speech, no focal motor/sensory deficits Breast exam pendulous breasts are symmetrical without nipple discharge or inversion.  S/p right lumpectomy, incisions completely healed  with minimal scar tissue.  Scattered dense tissue without palpable mass or nodularity in either breast or axilla that I could appreciate.  LABORATORY DATA:  I have reviewed the data as listed    Latest Ref Rng & Units 05/23/2022   10:03 AM 11/22/2021   11:04 AM 10/13/2021    8:04 PM  CBC  WBC 4.0 - 10.5 K/uL 5.4  4.6  5.0   Hemoglobin 12.0 - 15.0 g/dL 13.2  12.4  13.5   Hematocrit 36.0 - 46.0 % 37.9  36.6  38.3   Platelets 150 - 400 K/uL 237  209.0  233         Latest Ref Rng & Units 05/23/2022   10:03 AM 11/22/2021   11:04 AM 10/13/2021    8:04 PM  CMP  Glucose 70 - 99 mg/dL 99  97  98   BUN 8 - 23 mg/dL _0 Creatinine 0.44 - 1.00 mg/dL 0.74  0.75  0.72   Sodium 135 - 145 mmol/L 140  142  139   Potassium 3.5 - 5.1 mmol/L 3.6  3.4  3.5   Chloride 98 - 111 mmol/L 102  104  102   CO2 22 - 32 mmol/L 33  31  29   Calcium 8.9 - 10.3 mg/dL 10.3  9.7  9.6   Total Protein 6.5 - 8.1 g/dL 6.8  6.5  7.0   Total Bilirubin 0.3 - 1.2 mg/dL 0.5  0.6  0.5   Alkaline Phos 38 - 126 U/L 50  55  59   AST 15 - 41 U/L 32  23  29   ALT 0 - 44 U/L _1 RADIOGRAPHIC STUDIES: I have personally reviewed the radiological images as listed and agreed with the findings in the report. No results found.   ASSESSMENT & PLAN: Melissa Mcclure is a 66 y.o. female with    1. Ductal carcinoma in situ (DCIS) of right breast, ER/PR Positive, Grade 1 -Diagnosed in 10/2017. S/p right breast lumpectomy and adjuvant radiation.  -She started anti-estrogen  therapy with Raloxifene in 02/2018. She held it for 2 months in 2021 due to hot flashes but restarted due to no improvement. Overall tolerable.  -Most recent mammogram 12/14/21 was benign. -Melissa Mcclure is clinically doing well.  Breast exam is benign, labs are normal.  There is no clinical concern for recurrent or new breast cancer.   -She is over 4 years from initial breast cancer, continue surveillance; she will complete raloxifene next summer -Follow-up in 1 year for last visit.   2. Hot Flashes  -She knows to avoid hormonal replacement. Gabapentin and Effexor did not work well enough for her.  -holding Raloxifene for 2 months in 2021 did not help.  -Improved with acupuncture, once a month -Stable and tolerable   3. Genetic Counseling was negative for pathogenic mutation.    4. HTN, history of skin melanoma, rash, arthritis  -Follow-up with PCP -She has chronic diffuse skin rash of her LE. She will continue f/u with her Dermatologist.  -OA Mainly in hands, back and neck  -I encouraged her to continue healthy active lifestyle and age-appropriate health maintenance   5. Bone health  -Her 10/2017 D[facility] at [facility] was normal with lowest T-Score of -0.4 at left femur neck. Her 12/10/19 was stable (T-score -0.5).  -She is on Raloxifene which strengthens her bone. -Repeat DEXA 11/2022 with mammogram  Plan: -Labs reviewed -Continue breast cancer surveillance and raloxifene, plan to complete next summer (anytime June through August 2024, when her supply runs out) -Mammogram and DEXA 11/2022 at [facility] in 1 year for last visit   Orders Placed This Encounter  Procedures   MM 3D SCREEN BREAST BILATERAL    Standing Status:   Future    Standing Expiration Date:   05/24/2023    Scheduling Instructions:     [facility]    Order Specific Question:   Reason for Exam (SYMPTOM  OR DIAGNOSIS REQUIRED)    Answer:   h/o R breast DCIS 2019    Order Specific Question:   Preferred imaging location?     Answer:   External   DG Bone Density    Standing Status:   Future    Standing Expiration Date:   05/23/2023    Scheduling Instructions:     [facility]    Order Specific Question:   Reason for Exam (SYMPTOM  OR DIAGNOSIS REQUIRED)    Answer:   h/o R breast DCIS 2019, on raloxifene    Order Specific Question:   Preferred imaging location?    Answer:   External   All questions were answered. The patient knows to call the clinic with any problems, questions or concerns. No barriers to learning were detected.      Melissa Feeling, NP 05/23/22

## 2022-05-23 ENCOUNTER — Other Ambulatory Visit: Payer: Self-pay

## 2022-05-23 ENCOUNTER — Inpatient Hospital Stay: Payer: BC Managed Care – PPO | Attending: Nurse Practitioner

## 2022-05-23 ENCOUNTER — Encounter: Payer: Self-pay | Admitting: Nurse Practitioner

## 2022-05-23 ENCOUNTER — Inpatient Hospital Stay: Payer: BC Managed Care – PPO | Admitting: Nurse Practitioner

## 2022-05-23 VITALS — BP 149/85 | HR 90 | Temp 98.1°F | Resp 15 | Ht 62.0 in | Wt 135.3 lb

## 2022-05-23 DIAGNOSIS — D0511 Intraductal carcinoma in situ of right breast: Secondary | ICD-10-CM | POA: Insufficient documentation

## 2022-05-23 DIAGNOSIS — Z79811 Long term (current) use of aromatase inhibitors: Secondary | ICD-10-CM | POA: Insufficient documentation

## 2022-05-23 DIAGNOSIS — Z79899 Other long term (current) drug therapy: Secondary | ICD-10-CM | POA: Diagnosis not present

## 2022-05-23 LAB — CBC WITH DIFFERENTIAL (CANCER CENTER ONLY)
Abs Immature Granulocytes: 0.01 10*3/uL (ref 0.00–0.07)
Basophils Absolute: 0 10*3/uL (ref 0.0–0.1)
Basophils Relative: 1 %
Eosinophils Absolute: 0.2 10*3/uL (ref 0.0–0.5)
Eosinophils Relative: 3 %
HCT: 37.9 % (ref 36.0–46.0)
Hemoglobin: 13.2 g/dL (ref 12.0–15.0)
Immature Granulocytes: 0 %
Lymphocytes Relative: 33 %
Lymphs Abs: 1.8 10*3/uL (ref 0.7–4.0)
MCH: 32 pg (ref 26.0–34.0)
MCHC: 34.8 g/dL (ref 30.0–36.0)
MCV: 92 fL (ref 80.0–100.0)
Monocytes Absolute: 0.5 10*3/uL (ref 0.1–1.0)
Monocytes Relative: 8 %
Neutro Abs: 3 10*3/uL (ref 1.7–7.7)
Neutrophils Relative %: 55 %
Platelet Count: 237 10*3/uL (ref 150–400)
RBC: 4.12 MIL/uL (ref 3.87–5.11)
RDW: 12.2 % (ref 11.5–15.5)
WBC Count: 5.4 10*3/uL (ref 4.0–10.5)
nRBC: 0 % (ref 0.0–0.2)

## 2022-05-23 LAB — CMP (CANCER CENTER ONLY)
ALT: 29 U/L (ref 0–44)
AST: 32 U/L (ref 15–41)
Albumin: 4.4 g/dL (ref 3.5–5.0)
Alkaline Phosphatase: 50 U/L (ref 38–126)
Anion gap: 5 (ref 5–15)
BUN: 21 mg/dL (ref 8–23)
CO2: 33 mmol/L — ABNORMAL HIGH (ref 22–32)
Calcium: 10.3 mg/dL (ref 8.9–10.3)
Chloride: 102 mmol/L (ref 98–111)
Creatinine: 0.74 mg/dL (ref 0.44–1.00)
GFR, Estimated: >60 mL/min (ref >60)
Glucose, Bld: 99 mg/dL (ref 70–99)
Potassium: 3.6 mmol/L (ref 3.5–5.1)
Sodium: 140 mmol/L (ref 135–145)
Total Bilirubin: 0.5 mg/dL (ref 0.3–1.2)
Total Protein: 6.8 g/dL (ref 6.5–8.1)

## 2022-05-23 NOTE — Addendum Note (Signed)
Addended by: Alla Feeling on: 05/23/2022 11:24 AM   Modules accepted: Orders

## 2022-05-24 NOTE — Progress Notes (Signed)
New Patient Visit  BP 124/72   Pulse 80   Temp 97.9 F (36.6 C) (Temporal)   Ht '5\' 2"'$  (1.575 m)   Wt 134 lb 6.4 oz (61 kg)   LMP 06/24/2008   SpO2 96%   BMI 24.58 kg/m    Subjective:    Patient ID: Melissa Mcclure, female    DOB: [birth year], 66 y.o.   MRN: [id number]  CC: Chief Complaint  Patient presents with   Establish Care    [facility]- establish care.  No concerns.  Fasting today.   Declines flu shot.    HPI: Melissa Mcclure is a 66 y.o. female presents to transfer care to a new provider.  Introduced to Designer, jewellery role and practice setting.  All questions answered.  Discussed provider/patient relationship and expectations.  Eran has a history of hypertension and is not checking her blood pressure at home.  She does have a cuff and will check it once in a while.  She is currently taking losartan-hydrochlorothiazide 100-12.5 mg daily.  She denies chest pain, shortness of breath, headaches.  She also has a history of elevated cholesterol and is taking atorvastatin 10 mg daily.  She has a history of breast cancer and had a lumpectomy and radiation.  She is currently taking raloxifene and following with oncology.    She has a history of melanoma and follows with dermatology yearly.  She wears sunscreen when she goes outside.  She has a history of chronic back pain and is following with orthopedics.  She takes tramadol and diclofenac as needed for pain.  She is also seeing orthopedics for a tear in her left rotator cuff and is planning to have surgery next month.  Depression Screen done:     05/25/2022   12:16 PM 11/22/2021   10:32 AM 09/07/2021    9:14 AM 08/22/2021   11:23 AM 02/13/2021   11:33 AM  Depression screen PHQ 2/9  Decreased Interest 0 0 0 0 0  Down, Depressed, Hopeless 0 0 0 0 0  PHQ - 2 Score 0 0 0 0 0     Past Medical History:  Diagnosis Date   Arthritis    Cancer ([place]) 04/2010   melanoma-left knee, right arm   Chronic back pain    Complication of  anesthesia    Ductal carcinoma in situ (DCIS) of right breast 10/2017   Family history of adverse reaction to anesthesia    mother has PONV   Family history of colon cancer    Family history of melanoma    Family history of ovarian cancer    Genetic testing 12/23/2017   STAT Breast panel with reflex to Multi-Cancer panel (83 genes) @ [facility] - No pathogenic mutations detected   GERD (gastroesophageal reflux disease)    H/O hiatal hernia    Heart murmur    no problems   History of hand surgery    Hyperlipidemia    Hypertension    Left rotator cuff tear    PONV (postoperative nausea and vomiting)    "zofran works great   Seasonal allergies    Vertigo     Past Surgical History:  Procedure Laterality Date   ABDOMINAL HYSTERECTOMY     ANTERIOR LAT LUMBAR FUSION Right 04/17/2013   Procedure: ANTERIOR LATERAL LUMBAR FUSION 3 LEVELS;  Surgeon: Erline Levine, MD;  Location: [facility];  Service: Neurosurgery;  Laterality: Right;  Right Lumbar Two-Three Lumbar Three-Four Lumbar Four-Five  Anterolateral Fusion  BACK SURGERY     BREAST LUMPECTOMY WITH RADIOACTIVE SEED LOCALIZATION Right 12/25/2017   Procedure: RIGHT BREAST LUMPECTOMY WITH RADIOACTIVE SEED LOCALIZATION;  Surgeon: Fanny Skates, MD;  Location: [place];  Service: General;  Laterality: Right;   [place]   COLONOSCOPY     COLONOSCOPY WITH PROPOFOL N/A 06/23/2018   Procedure: COLONOSCOPY WITH PROPOFOL;  Surgeon: Clarene Essex, MD;  Location: [facility];  Service: Endoscopy;  Laterality: N/A;   CYSTOSCOPY N/A 06/26/2016   Procedure: CYSTOSCOPY;  Surgeon: Megan Salon, MD;  Location: [facility];  Service: Gynecology;  Laterality: N/A;   DILATATION & CURETTAGE/HYSTEROSCOPY WITH MYOSURE N/A 01/24/2016   Procedure: DILATATION & CURETTAGE/HYSTEROSCOPY WITH MYOSURE;  Surgeon: Megan Salon, MD;  Location: [facility];  Service: Gynecology;  Laterality: N/A;    FOOT SURGERY Right    early 20s-bone spur removal   HAND SURGERY Bilateral    for basal joint arthritis   LAPAROSCOPIC HYSTERECTOMY Bilateral 06/26/2016   Procedure: HYSTERECTOMY TOTAL LAPAROSCOPIC;  Surgeon: Megan Salon, MD;  Location: [facility];  Service: Gynecology;  Laterality: Bilateral;   LUMBAR PERCUTANEOUS PEDICLE SCREW 3 LEVEL N/A 04/17/2013   Procedure: LUMBAR PERCUTANEOUS PEDICLE SCREW 3 LEVEL;  Surgeon: Erline Levine, MD;  Location: [facility];  Service: Neurosurgery;  Laterality: N/A;  Decompression and Posterior Lumbar Interbody Fusion of Lumbar Five-Sacral One, Percutaneous Screws Lumbar Two through Sacral One   MELANOMA EXCISION     knee 08 lft. elbow 11rt   SALPINGOOPHORECTOMY Bilateral 06/26/2016   Procedure: SALPINGO OOPHORECTOMY;  Surgeon: Megan Salon, MD;  Location: [facility];  Service: Gynecology;  Laterality: Bilateral;   SHOULDER SURGERY Right 04/28/2015   STRABISMUS SURGERY Bilateral 07/29/2020   Procedure: BILATERAL STRABISMUS REPAIR;  Surgeon: Everitt Amber, MD;  Location: [facility];  Service: Ophthalmology;  Laterality: Bilateral;   TUBAL LIGATION  1986   WLE  06/17/2007   and left groin lymph node   XI ROBOTIC ASSISTED HIATAL HERNIA REPAIR N/A 04/04/2021   Procedure: ROBOTIC HIATAL HERNIA REPAIR WITH FUNDOPLICATION;  Surgeon: Ralene Ok, MD;  Location: [facility];  Service: General;  Laterality: N/A;    Family History  Problem Relation Age of Onset   Diabetes Father        borderline   Melanoma Father        dx 44s/60s; deceased 47s   Congestive Heart Failure Father    Colon cancer Mother        dx 91s; deceased 63   Hypertension Mother    Osteoporosis Mother    Ovarian cancer Maternal Grandmother        deceased 69s   Heart disease Paternal Grandfather    Melanoma Daughter        on leg; dx 54s     Social History   Tobacco Use   Smoking status: Never   Smokeless tobacco: Never  Vaping Use   Vaping Use: Never used  Substance  Use Topics   Alcohol use: Yes    Alcohol/week: 0.0 - 1.0 standard drinks of alcohol    Comment: socially   Drug use: No    Current Outpatient Medications on File Prior to Visit  Medication Sig Dispense Refill   acetaminophen (TYLENOL) 500 MG tablet Take 1,000 mg by mouth 2 (two) times daily as needed for mild pain.     Ascorbic Acid (VITAMIN C WITH ROSE HIPS) 1000 MG  tablet Take 1,000 mg by mouth daily.     atorvastatin (LIPITOR) 10 MG tablet Take 1 tablet (10 mg total) by mouth daily. 90 tablet 3   Calcium-Vitamin D-Vitamin K 650-12.5-40 MG-MCG-MCG CHEW      cetirizine (ZYRTEC) 10 MG tablet Take 10 mg by mouth daily.     diclofenac (VOLTAREN) 75 MG EC tablet TK 1 T PO BID PRN     losartan-hydrochlorothiazide (HYZAAR) 100-12.5 MG tablet TAKE 1 TABLET BY MOUTH DAILY. REPORTED ON 01/23/2016 90 tablet 1   meclizine (ANTIVERT) 25 MG tablet Take 1 tablet (25 mg total) by mouth 3 (three) times daily as needed (vertigo). Reported on 01/23/2016 30 tablet 1   Multiple Vitamins-Minerals (ALIVE WOMENS 50+) TABS Take 1 tablet by mouth daily.     ondansetron (ZOFRAN-ODT) 8 MG disintegrating tablet Take 8 mg by mouth every 8 (eight) hours as needed for nausea or vomiting.     psyllium (REGULOID) 0.52 g capsule Take 0.52 g by mouth daily.     raloxifene (EVISTA) 60 MG tablet TAKE 1 TABLET BY MOUTH EVERY DAY 90 tablet 3   traMADol (ULTRAM) 50 MG tablet Take 50 mg by mouth every 6 (six) hours as needed (for pain).     Turmeric 500 MG CAPS Take by mouth.     No current facility-administered medications on file prior to visit.     Review of Systems  Constitutional: Negative.   HENT: Negative.    Eyes: Negative.   Respiratory: Negative.    Cardiovascular: Negative.   Gastrointestinal: Negative.   Genitourinary: Negative.   Musculoskeletal:  Positive for arthralgias (left shoulder) and back pain.  Neurological: Negative.   Psychiatric/Behavioral: Negative.        Objective:    BP 124/72   Pulse 80    Temp 97.9 F (36.6 C) (Temporal)   Ht '5\' 2"'$  (1.575 m)   Wt 134 lb 6.4 oz (61 kg)   LMP 06/24/2008   SpO2 96%   BMI 24.58 kg/m   Wt Readings from Last 3 Encounters:  05/25/22 134 lb 6.4 oz (61 kg)  05/23/22 135 lb 4.8 oz (61.4 kg)  11/22/21 140 lb (63.5 kg)    BP Readings from Last 3 Encounters:  05/25/22 124/72  05/23/22 (!) 149/85  11/22/21 140/76    Physical Exam Vitals and nursing note reviewed.  Constitutional:      General: She is not in acute distress.    Appearance: Normal appearance.  HENT:     Head: Normocephalic and atraumatic.     Right Ear: Tympanic membrane, ear canal and external ear normal.     Left Ear: Tympanic membrane, ear canal and external ear normal.  Eyes:     Conjunctiva/sclera: Conjunctivae normal.  Cardiovascular:     Rate and Rhythm: Normal rate and regular rhythm.     Pulses: Normal pulses.     Heart sounds: Normal heart sounds.  Pulmonary:     Effort: Pulmonary effort is normal.     Breath sounds: Normal breath sounds.  Abdominal:     Palpations: Abdomen is soft.     Tenderness: There is no abdominal tenderness.  Musculoskeletal:        General: Normal range of motion.     Cervical back: Normal range of motion and neck supple.     Right lower leg: No edema.     Left lower leg: No edema.  Lymphadenopathy:     Cervical: No cervical adenopathy.  Skin:  General: Skin is warm and dry.  Neurological:     General: No focal deficit present.     Mental Status: She is alert and oriented to person, place, and time.     Cranial Nerves: No cranial nerve deficit.     Coordination: Coordination normal.     Gait: Gait normal.  Psychiatric:        Mood and Affect: Mood normal.        Behavior: Behavior normal.        Thought Content: Thought content normal.        Judgment: Judgment normal.        Assessment & Plan:   Problem List Items Addressed This Visit       Cardiovascular and Mediastinum   Essential hypertension    Chronic,  stable.  Continue losartan-hydrochlorothiazide 100/12.5 mg daily.  Most recent CMP, CBC reviewed.  Follow-up 6 months.        Other   Ductal carcinoma in situ (DCIS) of right breast    She has a history of breast cancer, she received a lumpectomy and radiation.  She is currently taking raloxifene daily.  Continue collaboration recommendation from oncology.      Elevated LDL cholesterol level    Continue atorvastatin 10 mg daily.  We will check lipid panel and adjust regimen based on results.      Relevant Orders   Lipid panel   Chronic low back pain without sciatica    Chronic, stable.  She is currently following with orthopedics and will take diclofenac or tramadol as needed for pain.  Continue current regimen.  Encourage stretches daily.  Follow-up in 6 months or sooner with concerns.      Other Visit Diagnoses     Routine general medical examination at a health care facility    -  Primary   Health maintenance reviewed and updated.  CMP, CBC reviewed from oncology 2 days ago.  Discussed nutrition, exercise.  F/U 1 year       LABORATORY TESTING:  - Pap smear: not applicable  IMMUNIZATIONS:   - Tdap: Tetanus vaccination status reviewed: last tetanus booster within 10 years. - Influenza: Refused - Pneumovax: Up to date - Prevnar: Up to date - HPV: Not applicable - Zostavax vaccine: Up to date  SCREENING: -Mammogram: Up to date  - Colonoscopy: Up to date  - Bone Density: Up to date  -Hearing Test: Not applicable  -Spirometry: Not applicable   PATIENT COUNSELING:   Advised to take 1 mg of folate supplement per day if capable of pregnancy.   Sexuality: Discussed sexually transmitted diseases, partner selection, use of condoms, avoidance of unintended pregnancy  and contraceptive alternatives.   Advised to avoid cigarette smoking.  I discussed with the patient that most people either abstain from alcohol or drink within safe limits (<=14/week and <=4 drinks/occasion for  males, <=7/weeks and <= 3 drinks/occasion for females) and that the risk for alcohol disorders and other health effects rises proportionally with the number of drinks per week and how often a drinker exceeds daily limits.  Discussed cessation/primary prevention of drug use and availability of treatment for abuse.   Diet: Encouraged to adjust caloric intake to maintain  or achieve ideal body weight, to reduce intake of dietary saturated fat and total fat, to limit sodium intake by avoiding high sodium foods and not adding table salt, and to maintain adequate dietary potassium and calcium preferably from fresh fruits, vegetables, and low-fat dairy products.  stressed the importance of regular exercise  Injury prevention: Discussed safety belts, safety helmets, smoke detector, smoking near bedding or upholstery.   Dental health: Discussed importance of regular tooth brushing, flossing, and dental visits.    NEXT PREVENTATIVE PHYSICAL DUE IN 1 YEAR.   Follow up plan: Return in about 6 months (around 11/23/2022) for HTN, HLD.

## 2022-05-25 ENCOUNTER — Ambulatory Visit: Payer: BC Managed Care – PPO | Admitting: Nurse Practitioner

## 2022-05-25 ENCOUNTER — Encounter: Payer: Self-pay | Admitting: Nurse Practitioner

## 2022-05-25 ENCOUNTER — Ambulatory Visit: Payer: BC Managed Care – PPO | Admitting: Family Medicine

## 2022-05-25 VITALS — BP 124/72 | HR 80 | Temp 97.9°F | Ht 62.0 in | Wt 134.4 lb

## 2022-05-25 DIAGNOSIS — E7800 Pure hypercholesterolemia, unspecified: Secondary | ICD-10-CM | POA: Diagnosis not present

## 2022-05-25 DIAGNOSIS — M545 Low back pain, unspecified: Secondary | ICD-10-CM

## 2022-05-25 DIAGNOSIS — G8929 Other chronic pain: Secondary | ICD-10-CM

## 2022-05-25 DIAGNOSIS — I10 Essential (primary) hypertension: Secondary | ICD-10-CM

## 2022-05-25 DIAGNOSIS — D0511 Intraductal carcinoma in situ of right breast: Secondary | ICD-10-CM

## 2022-05-25 DIAGNOSIS — Z0000 Encounter for general adult medical examination without abnormal findings: Secondary | ICD-10-CM

## 2022-05-25 LAB — LIPID PANEL
Cholesterol: 164 mg/dL (ref 0–200)
HDL: 73.4 mg/dL (ref >39.00)
LDL Cholesterol: 78 mg/dL (ref 0–99)
NonHDL: 90.46
Total CHOL/HDL Ratio: 2
Triglycerides: 62 mg/dL (ref 0.0–149.0)
VLDL: 12.4 mg/dL (ref 0.0–40.0)

## 2022-05-25 NOTE — Assessment & Plan Note (Signed)
She has a history of breast cancer, she received a lumpectomy and radiation.  She is currently taking raloxifene daily.  Continue collaboration recommendation from oncology.

## 2022-05-25 NOTE — Assessment & Plan Note (Signed)
Chronic, stable.  She is currently following with orthopedics and will take diclofenac or tramadol as needed for pain.  Continue current regimen.  Encourage stretches daily.  Follow-up in 6 months or sooner with concerns.

## 2022-05-25 NOTE — Assessment & Plan Note (Signed)
Chronic, stable.  Continue losartan-hydrochlorothiazide 100/12.5 mg daily.  Most recent CMP, CBC reviewed.  Follow-up 6 months.

## 2022-05-25 NOTE — Patient Instructions (Addendum)
It was great to see you!  We are checking your labs today and will let you know the results via mychart/phone.   Keep taking your medications as prescribed.  Let's follow-up in 6 months, sooner if you have concerns.  If a referral was placed today, you will be contacted for an appointment. Please note that routine referrals can sometimes take up to 3-4 weeks to process. Please call our office if you haven't heard anything after this time frame.  Take care,  Vance Peper, NP

## 2022-05-25 NOTE — Assessment & Plan Note (Signed)
Continue atorvastatin 10 mg daily.  We will check lipid panel and adjust regimen based on results.

## 2022-05-30 DIAGNOSIS — L570 Actinic keratosis: Secondary | ICD-10-CM | POA: Diagnosis not present

## 2022-05-30 DIAGNOSIS — D2261 Melanocytic nevi of right upper limb, including shoulder: Secondary | ICD-10-CM | POA: Diagnosis not present

## 2022-05-30 DIAGNOSIS — D045 Carcinoma in situ of skin of trunk: Secondary | ICD-10-CM | POA: Diagnosis not present

## 2022-05-30 DIAGNOSIS — D225 Melanocytic nevi of trunk: Secondary | ICD-10-CM | POA: Diagnosis not present

## 2022-05-30 DIAGNOSIS — D2239 Melanocytic nevi of other parts of face: Secondary | ICD-10-CM | POA: Diagnosis not present

## 2022-05-30 DIAGNOSIS — D224 Melanocytic nevi of scalp and neck: Secondary | ICD-10-CM | POA: Diagnosis not present

## 2022-07-10 ENCOUNTER — Telehealth: Payer: Self-pay | Admitting: *Deleted

## 2022-07-10 NOTE — Chronic Care Management (AMB) (Signed)
  Care Coordination   Note   07/10/2022 Name: LAKEISHA WALDROP MRN: [id number] DOB: 1956/04/20  DAVETTA OLLIFF is a 66 y.o. year old female who sees McElwee, Lauren A, NP for primary care. I reached out to Ericka Pontiff by phone today to offer care coordination services.  Ms. Probus was given information about Care Coordination services today including:   The Care Coordination services include support from the care team which includes your Nurse Coordinator, Clinical Social Worker, or Pharmacist.  The Care Coordination team is here to help remove barriers to the health concerns and goals most important to you. Care Coordination services are voluntary, and the patient may decline or stop services at any time by request to their care team member.   Care Coordination Consent Status: Patient agreed to services and verbal consent obtained.   Follow up plan:  Telephone appointment with care coordination team member scheduled for:  08/03/22  Encounter Outcome:  Pt. Scheduled  [place]  Direct Dial: [phone]

## 2022-07-22 ENCOUNTER — Other Ambulatory Visit: Payer: Self-pay | Admitting: Family Medicine

## 2022-07-22 DIAGNOSIS — E7800 Pure hypercholesterolemia, unspecified: Secondary | ICD-10-CM

## 2022-07-22 DIAGNOSIS — I10 Essential (primary) hypertension: Secondary | ICD-10-CM

## 2022-07-23 ENCOUNTER — Other Ambulatory Visit (HOSPITAL_COMMUNITY): Payer: Self-pay | Admitting: Physician Assistant

## 2022-07-23 DIAGNOSIS — M19011 Primary osteoarthritis, right shoulder: Secondary | ICD-10-CM | POA: Diagnosis not present

## 2022-07-23 DIAGNOSIS — M67814 Other specified disorders of tendon, left shoulder: Secondary | ICD-10-CM | POA: Diagnosis not present

## 2022-07-23 DIAGNOSIS — Z9889 Other specified postprocedural states: Secondary | ICD-10-CM

## 2022-07-23 DIAGNOSIS — M24111 Other articular cartilage disorders, right shoulder: Secondary | ICD-10-CM | POA: Diagnosis not present

## 2022-07-23 DIAGNOSIS — M24112 Other articular cartilage disorders, left shoulder: Secondary | ICD-10-CM | POA: Diagnosis not present

## 2022-07-23 DIAGNOSIS — M7552 Bursitis of left shoulder: Secondary | ICD-10-CM | POA: Diagnosis not present

## 2022-07-23 DIAGNOSIS — M25812 Other specified joint disorders, left shoulder: Secondary | ICD-10-CM | POA: Diagnosis not present

## 2022-07-23 DIAGNOSIS — G8918 Other acute postprocedural pain: Secondary | ICD-10-CM | POA: Diagnosis not present

## 2022-07-23 DIAGNOSIS — S46012A Strain of muscle(s) and tendon(s) of the rotator cuff of left shoulder, initial encounter: Secondary | ICD-10-CM | POA: Diagnosis not present

## 2022-07-23 DIAGNOSIS — M7522 Bicipital tendinitis, left shoulder: Secondary | ICD-10-CM | POA: Diagnosis not present

## 2022-07-23 DIAGNOSIS — S46812A Strain of other muscles, fascia and tendons at shoulder and upper arm level, left arm, initial encounter: Secondary | ICD-10-CM | POA: Diagnosis not present

## 2022-07-23 MED ORDER — ACETAMINOPHEN 500 MG PO TABS: 1000.0000 mg | ORAL_TABLET | Freq: Three times a day (TID) | ORAL | 0 refills | Status: DC

## 2022-07-23 MED ORDER — OXYCODONE HCL 5 MG PO TABS: ORAL_TABLET | ORAL | 0 refills | Status: AC

## 2022-07-23 MED ORDER — DICLOFENAC SODIUM 75 MG PO TBEC: 75.0000 mg | DELAYED_RELEASE_TABLET | Freq: Two times a day (BID) | ORAL | 0 refills | Status: DC

## 2022-07-23 MED ORDER — ONDANSETRON HCL 4 MG PO TABS: 4.0000 mg | ORAL_TABLET | Freq: Three times a day (TID) | ORAL | 0 refills | Status: AC | PRN

## 2022-07-23 NOTE — Progress Notes (Unsigned)
Patient had a left shoulder arthroscopy with rotator cuff repair on 07/23/2022 at the [facility]. Post-op medications sent to his local pharmacy.   Melissa Chapel, PA-C 07/23/2022

## 2022-07-27 ENCOUNTER — Other Ambulatory Visit (HOSPITAL_COMMUNITY): Payer: Self-pay | Admitting: Orthopaedic Surgery

## 2022-07-27 ENCOUNTER — Ambulatory Visit (HOSPITAL_COMMUNITY): Payer: BC Managed Care – PPO

## 2022-07-27 DIAGNOSIS — M6281 Muscle weakness (generalized): Secondary | ICD-10-CM | POA: Diagnosis not present

## 2022-07-27 DIAGNOSIS — M25612 Stiffness of left shoulder, not elsewhere classified: Secondary | ICD-10-CM | POA: Diagnosis not present

## 2022-07-27 DIAGNOSIS — Z4789 Encounter for other orthopedic aftercare: Secondary | ICD-10-CM | POA: Diagnosis not present

## 2022-07-27 DIAGNOSIS — M25512 Pain in left shoulder: Secondary | ICD-10-CM | POA: Diagnosis not present

## 2022-07-27 DIAGNOSIS — M79604 Pain in right leg: Secondary | ICD-10-CM

## 2022-07-31 DIAGNOSIS — M24112 Other articular cartilage disorders, left shoulder: Secondary | ICD-10-CM | POA: Diagnosis not present

## 2022-08-01 DIAGNOSIS — Z4789 Encounter for other orthopedic aftercare: Secondary | ICD-10-CM | POA: Diagnosis not present

## 2022-08-01 DIAGNOSIS — M25512 Pain in left shoulder: Secondary | ICD-10-CM | POA: Diagnosis not present

## 2022-08-01 DIAGNOSIS — M6281 Muscle weakness (generalized): Secondary | ICD-10-CM | POA: Diagnosis not present

## 2022-08-01 DIAGNOSIS — M25612 Stiffness of left shoulder, not elsewhere classified: Secondary | ICD-10-CM | POA: Diagnosis not present

## 2022-08-03 ENCOUNTER — Ambulatory Visit: Payer: Self-pay

## 2022-08-03 DIAGNOSIS — M25612 Stiffness of left shoulder, not elsewhere classified: Secondary | ICD-10-CM | POA: Diagnosis not present

## 2022-08-03 DIAGNOSIS — M6281 Muscle weakness (generalized): Secondary | ICD-10-CM | POA: Diagnosis not present

## 2022-08-03 DIAGNOSIS — Z4789 Encounter for other orthopedic aftercare: Secondary | ICD-10-CM | POA: Diagnosis not present

## 2022-08-03 DIAGNOSIS — M25512 Pain in left shoulder: Secondary | ICD-10-CM | POA: Diagnosis not present

## 2022-08-03 NOTE — Patient Outreach (Signed)
  Care Coordination   Reschedule  Visit Note   08/03/2022 Name: Melissa Mcclure MRN: [id number] DOB: 1956/05/19  Melissa Mcclure is a 66 y.o. year old female who sees McElwee, Lauren A, NP for primary care. I spoke with  Ericka Pontiff by phone today.  What matters to the patients health and wellness today?  The patient waas on her way to an appointment and asked to reschedule the call    Goals Addressed   None     SDOH assessments and interventions completed:  No     Care Coordination Interventions Activated:  No  Care Coordination Interventions:  No, not indicated   Follow up plan: Follow up call scheduled for 11/14 1115 am    Encounter Outcome:  Pt. Visit Completed   Lazaro Arms RN, BSN, [facility]   Phone: [phone]

## 2022-08-07 ENCOUNTER — Ambulatory Visit: Payer: Self-pay

## 2022-08-07 NOTE — Patient Instructions (Signed)
Visit Information  Thank you for taking time to visit with me today. Please don't hesitate to contact me if I can be of assistance to you.   Following are the goals we discussed today:   Goals Addressed             This Visit's Progress    COMPLETED: Care Coordnination Activities- no follow up required        Care Coordination Interventions: Active listening / Reflection utilized  Emotional Support Provided Problem [place] strategies reviewed Discussed/.Educated Care Coordination Program 2.   Discussed/.Educated Annual Wellness Visit 3.   Discussed/.Educated Social Determinates of Health 4.   Please inform PCP if services needed in the future         If you are experiencing a Mental Health or [place] or need someone to talk to, please call 1-800-273-TALK (toll free, 24 hour hotline)  Patient verbalizes understanding of instructions and care plan provided today and agrees to view in [place]. Active MyChart status and patient understanding of how to access instructions and care plan via MyChart confirmed with patient.      Lazaro Arms RN, BSN, [facility]   Phone: [phone]

## 2022-08-07 NOTE — Patient Outreach (Signed)
  Care Coordination   Initial Visit Note   08/07/2022 Name: ZAKYIA GAGAN MRN: [id number] DOB: [birth year]  NYEMAH WATTON is a 66 y.o. year old female who sees McElwee, Lauren A, NP for primary care. I spoke with  Ericka Pontiff by phone today.  What matters to the patients health and wellness today?  No questions or concerns about her health at this time.    Goals Addressed             This Visit's Progress    COMPLETED: Care Coordnination Activities- no follow up required        Care Coordination Interventions: Active listening / Reflection utilized  Emotional Support Provided Problem [place] strategies reviewed Discussed/.Educated Care Coordination Program 2.   Discussed/.Educated Annual Wellness Visit 3.   Discussed/.Educated Social Determinates of Health 4.   Please inform PCP if services needed in the future         SDOH assessments and interventions completed:  Yes  SDOH Interventions Today    Flowsheet Row Most Recent Value  SDOH Interventions   Food Insecurity Interventions Intervention Not Indicated  Transportation Interventions Intervention Not Indicated        Care Coordination Interventions Activated:  Yes  Care Coordination Interventions:  Yes, provided   Follow up plan: No further intervention required.   Encounter Outcome:  Pt. Visit Completed   Lazaro Arms RN, BSN, [facility]   Phone: [phone]

## 2022-08-09 DIAGNOSIS — M6281 Muscle weakness (generalized): Secondary | ICD-10-CM | POA: Diagnosis not present

## 2022-08-09 DIAGNOSIS — Z4789 Encounter for other orthopedic aftercare: Secondary | ICD-10-CM | POA: Diagnosis not present

## 2022-08-09 DIAGNOSIS — M25512 Pain in left shoulder: Secondary | ICD-10-CM | POA: Diagnosis not present

## 2022-08-09 DIAGNOSIS — M25612 Stiffness of left shoulder, not elsewhere classified: Secondary | ICD-10-CM | POA: Diagnosis not present

## 2022-08-10 DIAGNOSIS — Z4789 Encounter for other orthopedic aftercare: Secondary | ICD-10-CM | POA: Diagnosis not present

## 2022-08-10 DIAGNOSIS — M25612 Stiffness of left shoulder, not elsewhere classified: Secondary | ICD-10-CM | POA: Diagnosis not present

## 2022-08-10 DIAGNOSIS — M6281 Muscle weakness (generalized): Secondary | ICD-10-CM | POA: Diagnosis not present

## 2022-08-10 DIAGNOSIS — M25512 Pain in left shoulder: Secondary | ICD-10-CM | POA: Diagnosis not present

## 2022-08-13 DIAGNOSIS — Z4789 Encounter for other orthopedic aftercare: Secondary | ICD-10-CM | POA: Diagnosis not present

## 2022-08-13 DIAGNOSIS — M25612 Stiffness of left shoulder, not elsewhere classified: Secondary | ICD-10-CM | POA: Diagnosis not present

## 2022-08-13 DIAGNOSIS — M6281 Muscle weakness (generalized): Secondary | ICD-10-CM | POA: Diagnosis not present

## 2022-08-13 DIAGNOSIS — M25512 Pain in left shoulder: Secondary | ICD-10-CM | POA: Diagnosis not present

## 2022-08-15 DIAGNOSIS — M6281 Muscle weakness (generalized): Secondary | ICD-10-CM | POA: Diagnosis not present

## 2022-08-15 DIAGNOSIS — M25512 Pain in left shoulder: Secondary | ICD-10-CM | POA: Diagnosis not present

## 2022-08-15 DIAGNOSIS — Z4789 Encounter for other orthopedic aftercare: Secondary | ICD-10-CM | POA: Diagnosis not present

## 2022-08-15 DIAGNOSIS — M25612 Stiffness of left shoulder, not elsewhere classified: Secondary | ICD-10-CM | POA: Diagnosis not present

## 2022-08-15 NOTE — Progress Notes (Signed)
[facility] Note  08/20/2022     CHIEF COMPLAINT Patient presents for Retina Follow Up   HISTORY OF PRESENT ILLNESS: Melissa Mcclure is a 66 y.o. female who presents to the clinic today for:   HPI     Retina Follow Up   Patient presents with  Other.  In both eyes.  This started 6 months ago.  Duration of 6 months.  I, the attending physician,  performed the HPI with the patient and updated documentation appropriately.        Comments   6 month retinoschisis pt states no vision changes noticed       Last edited by Bernarda Caffey, MD on 08/20/2022  2:44 PM.    Pt states vision is stable, she had rotor cuff and bicep repair 4 weeks ago, pt is going to see Dr. Lucianne Lei in a couple weeks for her yearly exam   Referring physician: Libby Maw, MD [place] [place],  [place] [address]  HISTORICAL INFORMATION:   Selected notes from the MEDICAL RECORD NUMBER Referred by Dr. Lucianne Lei for concern of retinoschisis OS LEE:  Ocular Hx- PMH-    CURRENT MEDICATIONS: No current outpatient medications on file. (Ophthalmic Drugs)   No current facility-administered medications for this visit. (Ophthalmic Drugs)   Current Outpatient Medications (Other)  Medication Sig   acetaminophen (TYLENOL) 500 MG tablet Take 2 tablets (1,000 mg total) by mouth every 8 (eight) hours for 14 days.   Ascorbic Acid (VITAMIN C WITH ROSE HIPS) 1000 MG tablet Take 1,000 mg by mouth daily.   atorvastatin (LIPITOR) 10 MG tablet TAKE 1 TABLET BY MOUTH EVERY DAY   Calcium-Vitamin D-Vitamin K 650-12.5-40 MG-MCG-MCG CHEW  (Patient not taking: Reported on 08/07/2022)   cetirizine (ZYRTEC) 10 MG tablet Take 10 mg by mouth daily.   diclofenac (VOLTAREN) 75 MG EC tablet Take 1 tablet (75 mg total) by mouth 2 (two) times daily.   losartan-hydrochlorothiazide (HYZAAR) 100-12.5 MG tablet TAKE 1 TABLET BY MOUTH DAILY. REPORTED ON 01/23/2016   meclizine (ANTIVERT) 25 MG tablet Take  1 tablet (25 mg total) by mouth 3 (three) times daily as needed (vertigo). Reported on 01/23/2016   Multiple Vitamins-Minerals (ALIVE WOMENS 50+) TABS Take 1 tablet by mouth daily.   psyllium (REGULOID) 0.52 g capsule Take 0.52 g by mouth daily.   raloxifene (EVISTA) 60 MG tablet TAKE 1 TABLET BY MOUTH EVERY DAY   Turmeric 500 MG CAPS Take by mouth.   No current facility-administered medications for this visit. (Other)   REVIEW OF SYSTEMS:   ALLERGIES Allergies  Allergen Reactions   Elemental Sulfur Hives   PAST MEDICAL HISTORY Past Medical History:  Diagnosis Date   Arthritis    Cancer ([place]) 04/2010   melanoma-left knee, right arm   Chronic back pain    Complication of anesthesia    Ductal carcinoma in situ (DCIS) of right breast 10/2017   Family history of adverse reaction to anesthesia    mother has PONV   Family history of colon cancer    Family history of melanoma    Family history of ovarian cancer    Genetic testing 12/23/2017   STAT Breast panel with reflex to Multi-Cancer panel (83 genes) @ [facility] - No pathogenic mutations detected   GERD (gastroesophageal reflux disease)    H/O hiatal hernia    Heart murmur    no problems   History of hand surgery    Hyperlipidemia  Hypertension    Left rotator cuff tear    PONV (postoperative nausea and vomiting)    "zofran works great   Seasonal allergies    Vertigo    Past Surgical History:  Procedure Laterality Date   ABDOMINAL HYSTERECTOMY     ANTERIOR LAT LUMBAR FUSION Right 04/17/2013   Procedure: ANTERIOR LATERAL LUMBAR FUSION 3 LEVELS;  Surgeon: Erline Levine, MD;  Location: [facility];  Service: Neurosurgery;  Laterality: Right;  Right Lumbar Two-Three Lumbar Three-Four Lumbar Four-Five  Anterolateral Fusion   BACK SURGERY     BREAST LUMPECTOMY WITH RADIOACTIVE SEED LOCALIZATION Right 12/25/2017   Procedure: RIGHT BREAST LUMPECTOMY WITH RADIOACTIVE SEED LOCALIZATION;  Surgeon: Fanny Skates, MD;  Location:  [place];  Service: General;  Laterality: Right;   [place]   COLONOSCOPY     COLONOSCOPY WITH PROPOFOL N/A 06/23/2018   Procedure: COLONOSCOPY WITH PROPOFOL;  Surgeon: Clarene Essex, MD;  [facility];  Service: Endoscopy;  Laterality: N/A;   CYSTOSCOPY N/A 06/26/2016   Procedure: CYSTOSCOPY;  Surgeon: Megan Salon, MD;  Location: [facility];  Service: Gynecology;  Laterality: N/A;   DILATATION & CURETTAGE/HYSTEROSCOPY WITH MYOSURE N/A 01/24/2016   Procedure: DILATATION & CURETTAGE/HYSTEROSCOPY WITH MYOSURE;  Surgeon: Megan Salon, MD;  Location: [facility];  Service: Gynecology;  Laterality: N/A;   FOOT SURGERY Right    early 20s-bone spur removal   HAND SURGERY Bilateral    for basal joint arthritis   LAPAROSCOPIC HYSTERECTOMY Bilateral 06/26/2016   Procedure: HYSTERECTOMY TOTAL LAPAROSCOPIC;  Surgeon: Megan Salon, MD;  Location: [facility];  Service: Gynecology;  Laterality: Bilateral;   LUMBAR PERCUTANEOUS PEDICLE SCREW 3 LEVEL N/A 04/17/2013   Procedure: LUMBAR PERCUTANEOUS PEDICLE SCREW 3 LEVEL;  Surgeon: Erline Levine, MD;  Location: [facility];  Service: Neurosurgery;  Laterality: N/A;  Decompression and Posterior Lumbar Interbody Fusion of Lumbar Five-Sacral One, Percutaneous Screws Lumbar Two through Sacral One   MELANOMA EXCISION     knee 08 lft. elbow 11rt   SALPINGOOPHORECTOMY Bilateral 06/26/2016   Procedure: SALPINGO OOPHORECTOMY;  Surgeon: Megan Salon, MD;  Location: [facility];  Service: Gynecology;  Laterality: Bilateral;   SHOULDER SURGERY Right 04/28/2015   STRABISMUS SURGERY Bilateral 07/29/2020   Procedure: BILATERAL STRABISMUS REPAIR;  Surgeon: Everitt Amber, MD;  Location: [place] [facility];  Service: Ophthalmology;  Laterality: Bilateral;   TUBAL LIGATION  1986   WLE  06/17/2007   and left groin lymph node   XI ROBOTIC ASSISTED HIATAL HERNIA REPAIR N/A 04/04/2021   Procedure:  ROBOTIC HIATAL HERNIA REPAIR WITH FUNDOPLICATION;  Surgeon: Ralene Ok, MD;  Location: [place];  Service: General;  Laterality: N/A;   FAMILY HISTORY Family History  Problem Relation Age of Onset   Diabetes Father        borderline   Melanoma Father        dx 66s/60s; deceased 71s   Congestive Heart Failure Father    Colon cancer Mother        dx 65s; deceased 40   Hypertension Mother    Osteoporosis Mother    Ovarian cancer Maternal Grandmother        deceased 60s   Heart disease Paternal Grandfather    Melanoma Daughter        on leg; dx 43s   SOCIAL HISTORY Social History   Tobacco Use   Smoking status: Never  Smokeless tobacco: Never  Vaping Use   Vaping Use: Never used  Substance Use Topics   Alcohol use: Yes    Alcohol/week: 0.0 - 1.0 standard drinks of alcohol    Comment: socially   Drug use: No       OPHTHALMIC EXAM:  Base Eye Exam     Visual Acuity (Snellen - Linear)       Right Left   Dist  20/20 -2 20/20 -2         Tonometry (Tonopen, 2:11 PM)       Right Left   Pressure 12 13         Pupils       Pupils Dark Light Shape React APD   Right PERRL 2 1 Round Sluggish None   Left PERRL 2 1 Round Sluggish None         Visual Fields       Left Right    Full Full         Extraocular Movement       Right Left    Full, Ortho Full, Ortho         Neuro/Psych     Oriented x3: Yes   Mood/Affect: Normal         Dilation     Both eyes: 2.5% Phenylephrine @ 2:11 PM           Slit Lamp and Fundus Exam     Slit Lamp Exam       Right Left   Lids/Lashes Dermatochalasis - upper lid, Meibomian gland dysfunction Dermatochalasis - upper lid, Meibomian gland dysfunction   Conjunctiva/Sclera nasal pingeucula Mild nasal and temporal pinguecula   Cornea 1-2+ Punctate epithelial erosions, tear film debris 1+ inferior Punctate epithelial erosions, mild tear film debris   Anterior Chamber deep, clear, narrow temporal angle  deep, clear, narrow temporal angle   Iris Round and dilated Round and dilated   Lens 2+ Nuclear sclerosis, 2+ Cortical cataract, mild pigment deposition anterior capsule 2+ Nuclear sclerosis, 2+ Cortical cataract   Anterior Vitreous Vitreous syneresis Vitreous syneresis, Posterior vitreous detachment, vitreous condensations         Fundus Exam       Right Left   Disc Pink and Sharp, +cupping, mild temporal PPA Pink and Sharp, +cupping, mild temporal PPA   C/D Ratio 0.7 0.6   Macula Flat, Good foveal reflex, mild RPE mottling, No heme or edema Flat, Good foveal reflex, mild RPE mottling, No heme or edema   Vessels mild attenuation, mild tortuosity, mild AV crossing changes attenuated, copper wiring, mild tortuosity   Periphery Attached, focal CR atrophy at 0900, shallow schisis ST periphery, no RT/RD Attached, bullous schisis from 0130-0300, no RT/RD           IMAGING AND PROCEDURES  Imaging and Procedures for 08/20/2022  OCT, Retina - OU - Both Eyes       Right Eye Quality was good. Central Foveal Thickness: 274. Progression has been stable. Findings include normal foveal contour, no IRF, no SRF (Partial PVD, focal multilaminer schisis ST periphery caught on widefield -- not imaged today).   Left Eye Quality was good. Central Foveal Thickness: 284. Progression has been stable. Findings include normal foveal contour, no IRF, no SRF (Bullous retinoschisis from 0100-0230 caught on widefield -- stable).   Notes *Images captured and stored on drive  Diagnosis / Impression:  NFP, no IRF/SRF OU OD: Partial PVD, focal multilaminer schisis ST periphery caught on widefield --  not imaged today OS: Bullous retinoschisis from 0100-0230 caught on widefield -- stable from prior  Clinical management:  See below  Abbreviations: NFP - Normal foveal profile. CME - cystoid macular edema. PED - pigment epithelial detachment. IRF - intraretinal fluid. SRF - subretinal fluid. EZ - ellipsoid  zone. ERM - epiretinal membrane. ORA - outer retinal atrophy. ORT - outer retinal tubulation. SRHM - subretinal hyper-reflective material. IRHM - intraretinal hyper-reflective material            ASSESSMENT/PLAN:    ICD-10-CM   1. Bilateral retinoschisis  H33.103 OCT, Retina - OU - Both Eyes    2. Essential hypertension  I10     3. Hypertensive retinopathy of both eyes  H35.033     4. History of strabismus surgery  Z98.890     5. Combined forms of age-related cataract of both eyes  H25.813      1. Retinoschisis OU  - OD: shallow schisis ST periphery - OS: bullous schisis located from 0100-0230 - both stable without progression; no RT/RD OU - discussed findings, prognosis, treatment options - no retinal or ophthalmic interventions indicated or recommended at this time - recommend monitoring - f/u 9 months, DFE, OCT -- scan through schisis  2,3. Hypertensive retinopathy OU - discussed importance of tight BP control - monitor  4. History of strabismus surgery  - pt reports history of XT  - s/p strabismus SX Dr. Annamaria Boots (11.05.21)  5. Mixed Cataract OU - The symptoms of cataract, surgical options, and treatments and risks were discussed with patient. - discussed diagnosis and progression - under the expert management of Dr. Lucianne Lei   Ophthalmic Meds Ordered this visit:  No orders of the defined types were placed in this encounter.    Return in about 9 months (around 05/21/2023) for f/u schisis OU, DFE, OCT.  There are no Patient Instructions on file for this visit.   Explained the diagnoses, plan, and follow up with the patient and they expressed understanding.  Patient expressed understanding of the importance of proper follow up care.   This document serves as a record of services personally performed by Gardiner Sleeper, MD, PhD. It was created on their behalf by Roselee Nova, COMT. The creation of this record is the provider's dictation and/or activities during the  visit.  Electronically signed by: Roselee Nova, COMT 08/21/22 12:50 PM  This document serves as a record of services personally performed by Gardiner Sleeper, MD, PhD. It was created on their behalf by San Jetty. Owens Shark, OA an ophthalmic technician. The creation of this record is the provider's dictation and/or activities during the visit.    Electronically signed by: San Jetty. Owens Shark, [place] 11.27.2023 12:50 PM   Gardiner Sleeper, M.D., Ph.D. Diseases & Surgery of the Retina and Vitreous [facility]  I have reviewed the above documentation for accuracy and completeness, and I agree with the above. Gardiner Sleeper, M.D., Ph.D. 08/21/22 12:52 PM   Abbreviations: M myopia (nearsighted); A astigmatism; H hyperopia (farsighted); P presbyopia; Mrx spectacle prescription;  CTL contact lenses; OD right eye; OS left eye; OU both eyes  XT exotropia; ET esotropia; PEK punctate epithelial keratitis; PEE punctate epithelial erosions; DES dry eye syndrome; MGD meibomian gland dysfunction; ATs artificial tears; PFAT's preservative free artificial tears; [place] nuclear sclerotic cataract; PSC posterior subcapsular cataract; ERM epi-retinal membrane; PVD posterior vitreous detachment; RD retinal detachment; DM diabetes mellitus; DR diabetic retinopathy; NPDR non-proliferative diabetic retinopathy; PDR proliferative diabetic  retinopathy; CSME clinically significant macular edema; DME diabetic macular edema; dbh dot blot hemorrhages; CWS cotton wool spot; POAG primary open angle glaucoma; C/D cup-to-disc ratio; HVF humphrey visual field; GVF goldmann visual field; OCT optical coherence tomography; IOP intraocular pressure; BRVO Branch retinal vein occlusion; CRVO central retinal vein occlusion; CRAO central retinal artery occlusion; BRAO branch retinal artery occlusion; RT retinal tear; SB scleral buckle; PPV pars plana vitrectomy; VH Vitreous hemorrhage; PRP panretinal laser photocoagulation; IVK  intravitreal kenalog; VMT vitreomacular traction; MH Macular hole;  NVD neovascularization of the disc; NVE neovascularization elsewhere; AREDS age related eye disease study; ARMD age related macular degeneration; POAG primary open angle glaucoma; EBMD epithelial/anterior basement membrane dystrophy; ACIOL anterior chamber intraocular lens; IOL intraocular lens; PCIOL posterior chamber intraocular lens; Phaco/IOL phacoemulsification with intraocular lens placement; [place] photorefractive keratectomy; LASIK laser assisted in situ keratomileusis; HTN hypertension; DM diabetes mellitus; COPD chronic obstructive pulmonary disease

## 2022-08-20 ENCOUNTER — Encounter (INDEPENDENT_AMBULATORY_CARE_PROVIDER_SITE_OTHER): Payer: Self-pay | Admitting: Ophthalmology

## 2022-08-20 ENCOUNTER — Ambulatory Visit (INDEPENDENT_AMBULATORY_CARE_PROVIDER_SITE_OTHER): Payer: BC Managed Care – PPO | Admitting: Ophthalmology

## 2022-08-20 DIAGNOSIS — H35033 Hypertensive retinopathy, bilateral: Secondary | ICD-10-CM | POA: Diagnosis not present

## 2022-08-20 DIAGNOSIS — H33103 Unspecified retinoschisis, bilateral: Secondary | ICD-10-CM | POA: Diagnosis not present

## 2022-08-20 DIAGNOSIS — H25813 Combined forms of age-related cataract, bilateral: Secondary | ICD-10-CM

## 2022-08-20 DIAGNOSIS — Z9889 Other specified postprocedural states: Secondary | ICD-10-CM | POA: Diagnosis not present

## 2022-08-20 DIAGNOSIS — I10 Essential (primary) hypertension: Secondary | ICD-10-CM | POA: Diagnosis not present

## 2022-08-22 DIAGNOSIS — M6281 Muscle weakness (generalized): Secondary | ICD-10-CM | POA: Diagnosis not present

## 2022-08-22 DIAGNOSIS — Z4789 Encounter for other orthopedic aftercare: Secondary | ICD-10-CM | POA: Diagnosis not present

## 2022-08-22 DIAGNOSIS — M25612 Stiffness of left shoulder, not elsewhere classified: Secondary | ICD-10-CM | POA: Diagnosis not present

## 2022-08-22 DIAGNOSIS — M25512 Pain in left shoulder: Secondary | ICD-10-CM | POA: Diagnosis not present

## 2022-08-24 DIAGNOSIS — M25612 Stiffness of left shoulder, not elsewhere classified: Secondary | ICD-10-CM | POA: Diagnosis not present

## 2022-08-24 DIAGNOSIS — M25512 Pain in left shoulder: Secondary | ICD-10-CM | POA: Diagnosis not present

## 2022-08-24 DIAGNOSIS — Z4789 Encounter for other orthopedic aftercare: Secondary | ICD-10-CM | POA: Diagnosis not present

## 2022-08-24 DIAGNOSIS — M6281 Muscle weakness (generalized): Secondary | ICD-10-CM | POA: Diagnosis not present

## 2022-08-28 DIAGNOSIS — M25512 Pain in left shoulder: Secondary | ICD-10-CM | POA: Diagnosis not present

## 2022-08-28 DIAGNOSIS — M25612 Stiffness of left shoulder, not elsewhere classified: Secondary | ICD-10-CM | POA: Diagnosis not present

## 2022-08-28 DIAGNOSIS — M6281 Muscle weakness (generalized): Secondary | ICD-10-CM | POA: Diagnosis not present

## 2022-08-28 DIAGNOSIS — Z4789 Encounter for other orthopedic aftercare: Secondary | ICD-10-CM | POA: Diagnosis not present

## 2022-08-30 DIAGNOSIS — H40013 Open angle with borderline findings, low risk, bilateral: Secondary | ICD-10-CM | POA: Diagnosis not present

## 2022-08-30 DIAGNOSIS — H25813 Combined forms of age-related cataract, bilateral: Secondary | ICD-10-CM | POA: Diagnosis not present

## 2022-08-30 DIAGNOSIS — H33103 Unspecified retinoschisis, bilateral: Secondary | ICD-10-CM | POA: Diagnosis not present

## 2022-08-30 DIAGNOSIS — H04123 Dry eye syndrome of bilateral lacrimal glands: Secondary | ICD-10-CM | POA: Diagnosis not present

## 2022-08-30 LAB — HM DIABETES EYE EXAM

## 2022-08-31 DIAGNOSIS — M25612 Stiffness of left shoulder, not elsewhere classified: Secondary | ICD-10-CM | POA: Diagnosis not present

## 2022-08-31 DIAGNOSIS — Z4789 Encounter for other orthopedic aftercare: Secondary | ICD-10-CM | POA: Diagnosis not present

## 2022-08-31 DIAGNOSIS — M25512 Pain in left shoulder: Secondary | ICD-10-CM | POA: Diagnosis not present

## 2022-08-31 DIAGNOSIS — M6281 Muscle weakness (generalized): Secondary | ICD-10-CM | POA: Diagnosis not present

## 2022-09-03 DIAGNOSIS — L570 Actinic keratosis: Secondary | ICD-10-CM | POA: Diagnosis not present

## 2022-09-03 DIAGNOSIS — L218 Other seborrheic dermatitis: Secondary | ICD-10-CM | POA: Diagnosis not present

## 2022-09-03 DIAGNOSIS — Z85828 Personal history of other malignant neoplasm of skin: Secondary | ICD-10-CM | POA: Diagnosis not present

## 2022-09-04 DIAGNOSIS — M24112 Other articular cartilage disorders, left shoulder: Secondary | ICD-10-CM | POA: Diagnosis not present

## 2022-09-06 DIAGNOSIS — M25512 Pain in left shoulder: Secondary | ICD-10-CM | POA: Diagnosis not present

## 2022-09-06 DIAGNOSIS — M25612 Stiffness of left shoulder, not elsewhere classified: Secondary | ICD-10-CM | POA: Diagnosis not present

## 2022-09-06 DIAGNOSIS — Z4789 Encounter for other orthopedic aftercare: Secondary | ICD-10-CM | POA: Diagnosis not present

## 2022-09-06 DIAGNOSIS — M6281 Muscle weakness (generalized): Secondary | ICD-10-CM | POA: Diagnosis not present

## 2022-09-07 DIAGNOSIS — M25512 Pain in left shoulder: Secondary | ICD-10-CM | POA: Diagnosis not present

## 2022-09-07 DIAGNOSIS — M6281 Muscle weakness (generalized): Secondary | ICD-10-CM | POA: Diagnosis not present

## 2022-09-07 DIAGNOSIS — Z4789 Encounter for other orthopedic aftercare: Secondary | ICD-10-CM | POA: Diagnosis not present

## 2022-09-07 DIAGNOSIS — M25612 Stiffness of left shoulder, not elsewhere classified: Secondary | ICD-10-CM | POA: Diagnosis not present

## 2022-09-10 DIAGNOSIS — M25512 Pain in left shoulder: Secondary | ICD-10-CM | POA: Diagnosis not present

## 2022-09-10 DIAGNOSIS — M25612 Stiffness of left shoulder, not elsewhere classified: Secondary | ICD-10-CM | POA: Diagnosis not present

## 2022-09-10 DIAGNOSIS — M6281 Muscle weakness (generalized): Secondary | ICD-10-CM | POA: Diagnosis not present

## 2022-09-10 DIAGNOSIS — Z4789 Encounter for other orthopedic aftercare: Secondary | ICD-10-CM | POA: Diagnosis not present

## 2022-09-11 DIAGNOSIS — M25612 Stiffness of left shoulder, not elsewhere classified: Secondary | ICD-10-CM | POA: Diagnosis not present

## 2022-09-11 DIAGNOSIS — Z4789 Encounter for other orthopedic aftercare: Secondary | ICD-10-CM | POA: Diagnosis not present

## 2022-09-11 DIAGNOSIS — M25512 Pain in left shoulder: Secondary | ICD-10-CM | POA: Diagnosis not present

## 2022-09-11 DIAGNOSIS — M6281 Muscle weakness (generalized): Secondary | ICD-10-CM | POA: Diagnosis not present

## 2022-09-13 ENCOUNTER — Ambulatory Visit (HOSPITAL_BASED_OUTPATIENT_CLINIC_OR_DEPARTMENT_OTHER): Payer: BC Managed Care – PPO | Admitting: Obstetrics & Gynecology

## 2022-09-21 DIAGNOSIS — M25612 Stiffness of left shoulder, not elsewhere classified: Secondary | ICD-10-CM | POA: Diagnosis not present

## 2022-09-21 DIAGNOSIS — M25512 Pain in left shoulder: Secondary | ICD-10-CM | POA: Diagnosis not present

## 2022-09-21 DIAGNOSIS — Z4789 Encounter for other orthopedic aftercare: Secondary | ICD-10-CM | POA: Diagnosis not present

## 2022-09-21 DIAGNOSIS — M6281 Muscle weakness (generalized): Secondary | ICD-10-CM | POA: Diagnosis not present

## 2022-09-26 DIAGNOSIS — M25612 Stiffness of left shoulder, not elsewhere classified: Secondary | ICD-10-CM | POA: Diagnosis not present

## 2022-09-26 DIAGNOSIS — M25512 Pain in left shoulder: Secondary | ICD-10-CM | POA: Diagnosis not present

## 2022-09-26 DIAGNOSIS — Z4789 Encounter for other orthopedic aftercare: Secondary | ICD-10-CM | POA: Diagnosis not present

## 2022-09-26 DIAGNOSIS — M6281 Muscle weakness (generalized): Secondary | ICD-10-CM | POA: Diagnosis not present

## 2022-10-17 DIAGNOSIS — M25512 Pain in left shoulder: Secondary | ICD-10-CM | POA: Diagnosis not present

## 2022-10-19 DIAGNOSIS — M25512 Pain in left shoulder: Secondary | ICD-10-CM | POA: Diagnosis not present

## 2022-10-23 DIAGNOSIS — M25512 Pain in left shoulder: Secondary | ICD-10-CM | POA: Diagnosis not present

## 2022-10-23 DIAGNOSIS — Z4789 Encounter for other orthopedic aftercare: Secondary | ICD-10-CM | POA: Diagnosis not present

## 2022-10-23 DIAGNOSIS — M6281 Muscle weakness (generalized): Secondary | ICD-10-CM | POA: Diagnosis not present

## 2022-10-23 DIAGNOSIS — M25612 Stiffness of left shoulder, not elsewhere classified: Secondary | ICD-10-CM | POA: Diagnosis not present

## 2022-10-30 DIAGNOSIS — Z4789 Encounter for other orthopedic aftercare: Secondary | ICD-10-CM | POA: Diagnosis not present

## 2022-10-30 DIAGNOSIS — M25512 Pain in left shoulder: Secondary | ICD-10-CM | POA: Diagnosis not present

## 2022-10-30 DIAGNOSIS — M25612 Stiffness of left shoulder, not elsewhere classified: Secondary | ICD-10-CM | POA: Diagnosis not present

## 2022-10-30 DIAGNOSIS — M6281 Muscle weakness (generalized): Secondary | ICD-10-CM | POA: Diagnosis not present

## 2022-11-06 DIAGNOSIS — M25512 Pain in left shoulder: Secondary | ICD-10-CM | POA: Diagnosis not present

## 2022-11-06 DIAGNOSIS — M6281 Muscle weakness (generalized): Secondary | ICD-10-CM | POA: Diagnosis not present

## 2022-11-06 DIAGNOSIS — Z4789 Encounter for other orthopedic aftercare: Secondary | ICD-10-CM | POA: Diagnosis not present

## 2022-11-06 DIAGNOSIS — M25612 Stiffness of left shoulder, not elsewhere classified: Secondary | ICD-10-CM | POA: Diagnosis not present

## 2022-11-13 DIAGNOSIS — M25512 Pain in left shoulder: Secondary | ICD-10-CM | POA: Diagnosis not present

## 2022-11-20 ENCOUNTER — Telehealth: Payer: Self-pay | Admitting: Gastroenterology

## 2022-11-20 ENCOUNTER — Ambulatory Visit (INDEPENDENT_AMBULATORY_CARE_PROVIDER_SITE_OTHER): Payer: BC Managed Care – PPO | Admitting: Obstetrics & Gynecology

## 2022-11-20 ENCOUNTER — Encounter (HOSPITAL_BASED_OUTPATIENT_CLINIC_OR_DEPARTMENT_OTHER): Payer: Self-pay | Admitting: Obstetrics & Gynecology

## 2022-11-20 VITALS — BP 138/78 | HR 63 | Ht 62.0 in | Wt 128.6 lb

## 2022-11-20 DIAGNOSIS — R1319 Other dysphagia: Secondary | ICD-10-CM | POA: Diagnosis not present

## 2022-11-20 DIAGNOSIS — Z01419 Encounter for gynecological examination (general) (routine) without abnormal findings: Secondary | ICD-10-CM | POA: Diagnosis not present

## 2022-11-20 DIAGNOSIS — N3942 Incontinence without sensory awareness: Secondary | ICD-10-CM | POA: Diagnosis not present

## 2022-11-20 DIAGNOSIS — Z9189 Other specified personal risk factors, not elsewhere classified: Secondary | ICD-10-CM | POA: Diagnosis not present

## 2022-11-20 DIAGNOSIS — Z800 Family history of malignant neoplasm of digestive organs: Secondary | ICD-10-CM

## 2022-11-20 DIAGNOSIS — D0511 Intraductal carcinoma in situ of right breast: Secondary | ICD-10-CM

## 2022-11-20 DIAGNOSIS — Z1211 Encounter for screening for malignant neoplasm of colon: Secondary | ICD-10-CM

## 2022-11-20 NOTE — Telephone Encounter (Signed)
Good afternoon Dr. Bryan Lemma,   Patient called stating that Dr. Hale Bogus had sent over a referral to our practice to be seen by you for  dysphagia, colonoscopy and  intermittent diarrhea. Patient stated that she is a previous Dr. Earlean Shawl patient but is requesting transfer of care to our practice because he misdiagnosed her sister. Patient had her last colonoscopy with Dr. Earlean Shawl in 2019. Patient stated that she has been having issues when she eats current foods, she begins to have diarrhea. Patient also mentioned that she had a hernia surgery with Dr. Rockwell Alexandria in 2022. Patients records are in epic will you please review and advise on scheduling patient?   Thank you.

## 2022-11-20 NOTE — Progress Notes (Signed)
67 y.o. G3P3 Married White or Caucasian female here for breast and pelvic exam.  Denies vaginal bleeding.  H/o TLH/BSO 10/17.  Does have some urinary incontinence.  Leaks with coughing or sneezing but sometimes she is just not aware and there is leakage.    Has hiatal hernia surgery.  Has some issues with swallowing.  She does have intermittent diarrhea at times as well.  Would like different GI.  Has seen Dr. Watt Climes in the past.    H/o DCIS and is on raloxifene.     Patient's last menstrual period was 06/24/2008.          Sexually active: No.  H/O STD:  no  Health Maintenance: PCP:  Vance Peper.  Last wellness appt was 05/2022.  Has f/u on Friday and will do blood work. Vaccines are up to date:  yes Colonoscopy:  06/23/2018 MMG:  12/14/2021 Negative.  Is scheduled.   BMD:  12/10/2019.  Pt is going to repeat this year. Last pap smear:  not indicated.   H/o abnormal pap smear:      reports that she has never smoked. She has never used smokeless tobacco. She reports current alcohol use. She reports that she does not use drugs.  Past Medical History:  Diagnosis Date   Arthritis    Cancer ([place]) 04/2010   melanoma-left knee, right arm   Chronic back pain    Complication of anesthesia    Ductal carcinoma in situ (DCIS) of right breast 10/2017   Family history of adverse reaction to anesthesia    mother has PONV   Family history of colon cancer    Family history of melanoma    Family history of ovarian cancer    Genetic testing 12/23/2017   STAT Breast panel with reflex to Multi-Cancer panel (83 genes) @ [facility] - No pathogenic mutations detected   GERD (gastroesophageal reflux disease)    H/O hiatal hernia    Heart murmur    no problems   History of hand surgery    Hyperlipidemia    Hypertension    Left rotator cuff tear    PONV (postoperative nausea and vomiting)    "zofran works great   Seasonal allergies    Vertigo     Past Surgical History:  Procedure Laterality  Date   ABDOMINAL HYSTERECTOMY     ANTERIOR LAT LUMBAR FUSION Right 04/17/2013   Procedure: ANTERIOR LATERAL LUMBAR FUSION 3 LEVELS;  Surgeon: Erline Levine, MD;  Location: [facility];  Service: Neurosurgery;  Laterality: Right;  Right Lumbar Two-Three Lumbar Three-Four Lumbar Four-Five  Anterolateral Fusion   BACK SURGERY     BREAST LUMPECTOMY WITH RADIOACTIVE SEED LOCALIZATION Right 12/25/2017   Procedure: RIGHT BREAST LUMPECTOMY WITH RADIOACTIVE SEED LOCALIZATION;  Surgeon: Fanny Skates, MD;  Location: [facility];  Service: General;  Laterality: Right;   [place]   COLONOSCOPY     COLONOSCOPY WITH PROPOFOL N/A 06/23/2018   Procedure: COLONOSCOPY WITH PROPOFOL;  Surgeon: Clarene Essex, MD;  [facility];  Service: Endoscopy;  Laterality: N/A;   CYSTOSCOPY N/A 06/26/2016   Procedure: CYSTOSCOPY;  Surgeon: Megan Salon, MD;  Location: [facility];  Service: Gynecology;  Laterality: N/A;   DILATATION & CURETTAGE/HYSTEROSCOPY WITH MYOSURE N/A 01/24/2016   Procedure: DILATATION & CURETTAGE/HYSTEROSCOPY WITH MYOSURE;  Surgeon: Megan Salon, MD;  Location: [facility];  Service: Gynecology;  Laterality: N/A;  FOOT SURGERY Right    early 20s-bone spur removal   HAND SURGERY Bilateral    for basal joint arthritis   LAPAROSCOPIC HYSTERECTOMY Bilateral 06/26/2016   Procedure: HYSTERECTOMY TOTAL LAPAROSCOPIC;  Surgeon: Megan Salon, MD;  Location: [facility];  Service: Gynecology;  Laterality: Bilateral;   LUMBAR PERCUTANEOUS PEDICLE SCREW 3 LEVEL N/A 04/17/2013   Procedure: LUMBAR PERCUTANEOUS PEDICLE SCREW 3 LEVEL;  Surgeon: Erline Levine, MD;  Location: [facility];  Service: Neurosurgery;  Laterality: N/A;  Decompression and Posterior Lumbar Interbody Fusion of Lumbar Five-Sacral One, Percutaneous Screws Lumbar Two through Sacral One   MELANOMA EXCISION     knee 08 lft. elbow 11rt   SALPINGOOPHORECTOMY Bilateral 06/26/2016    Procedure: SALPINGO OOPHORECTOMY;  Surgeon: Megan Salon, MD;  Location: [facility];  Service: Gynecology;  Laterality: Bilateral;   SHOULDER SURGERY Right 04/28/2015   STRABISMUS SURGERY Bilateral 07/29/2020   Procedure: BILATERAL STRABISMUS REPAIR;  Surgeon: Everitt Amber, MD;  Location: [facility];  Service: Ophthalmology;  Laterality: Bilateral;   TUBAL LIGATION  1986   WLE  06/17/2007   and left groin lymph node   XI ROBOTIC ASSISTED HIATAL HERNIA REPAIR N/A 04/04/2021   Procedure: ROBOTIC HIATAL HERNIA REPAIR WITH FUNDOPLICATION;  Surgeon: Ralene Ok, MD;  Location: [facility];  Service: General;  Laterality: N/A;    Current Outpatient Medications  Medication Sig Dispense Refill   Ascorbic Acid (VITAMIN C WITH ROSE HIPS) 1000 MG tablet Take 1,000 mg by mouth daily.     atorvastatin (LIPITOR) 10 MG tablet TAKE 1 TABLET BY MOUTH EVERY DAY 90 tablet 3   cetirizine (ZYRTEC) 10 MG tablet Take 10 mg by mouth daily.     losartan-hydrochlorothiazide (HYZAAR) 100-12.5 MG tablet TAKE 1 TABLET BY MOUTH DAILY. REPORTED ON 01/23/2016 90 tablet 1   meclizine (ANTIVERT) 25 MG tablet Take 1 tablet (25 mg total) by mouth 3 (three) times daily as needed (vertigo). Reported on 01/23/2016 30 tablet 1   Multiple Vitamins-Minerals (ALIVE WOMENS 50+) TABS Take 1 tablet by mouth daily.     psyllium (REGULOID) 0.52 g capsule Take 0.52 g by mouth daily.     raloxifene (EVISTA) 60 MG tablet TAKE 1 TABLET BY MOUTH EVERY DAY 90 tablet 3   Turmeric 500 MG CAPS Take by mouth.     acetaminophen (TYLENOL) 500 MG tablet Take 2 tablets (1,000 mg total) by mouth every 8 (eight) hours for 14 days. 84 tablet 0   No current facility-administered medications for this visit.    Family History  Problem Relation Age of Onset   Diabetes Father        borderline   Melanoma Father        dx 74s/60s; deceased 78s   Congestive Heart Failure Father    Colon cancer Mother        dx 73s; deceased 61    Hypertension Mother    Osteoporosis Mother    Ovarian cancer Maternal Grandmother        deceased 31s   Heart disease Paternal Grandfather    Melanoma Daughter        on leg; dx 31s    Review of Systems  Constitutional: Negative.   Genitourinary: Negative.     Exam:   BP 138/83 (BP Location: Right Arm, Patient Position: Sitting, Cuff Size: Normal)   Pulse 63   Ht '5\' 2"'$  (1.575 m) Comment: Reported  Wt 128 lb 9.6 oz (58.3 kg)   LMP 06/24/2008  BMI 23.52 kg/m   Height: '5\' 2"'$  (157.5 cm) (Reported)  General appearance: alert, cooperative and appears stated age Breasts: normal appearance, no masses or tenderness, well healed right breast scan Abdomen: soft, non-tender; bowel sounds normal; no masses,  no organomegaly Lymph nodes: Cervical, supraclavicular, and axillary nodes normal.  No abnormal inguinal nodes palpated Neurologic: Grossly normal  Pelvic: External genitalia:  no lesions              Urethra:  normal appearing urethra with no masses, tenderness or lesions              Bartholins and Skenes: normal                 Vagina: normal appearing vagina with atrophic changes and no discharge, no lesions              Cervix: absent              Pap taken: No. Bimanual Exam:  Uterus:  uterus absent              Adnexa: normal adnexa               Rectovaginal: Confirms               Anus:  normal sphincter tone, no lesions  Chaperone, Octaviano Batty, CMA, was present for exam.  Assessment/Plan: 1. GYN exam for high-risk Medicare patient - Pap smear not indicated - Mammogram scheduled 11/2021 - Colonoscopy due this year.  Referral placed.  - Bone mineral density due this year.  Pt will call to schedule - lab work is going to be done this week - vaccines reviewed/updated  2. Other dysphagia - Ambulatory referral to Gastroenterology  3. Urinary incontinence without sensory awareness - Ambulatory referral to Urogynecology - pt aware Dr. Wannetta Sender will be on maternity  leave and does not want referral to [facility]  4. Colon cancer screening - Ambulatory referral to Gastroenterology  5. Family history of colon cancer  6. Ductal carcinoma in situ (DCIS) of right breast - followed by Cira Rue.  On Raloxifene.

## 2022-11-23 ENCOUNTER — Ambulatory Visit: Payer: BC Managed Care – PPO | Admitting: Nurse Practitioner

## 2022-11-23 ENCOUNTER — Encounter: Payer: Self-pay | Admitting: Nurse Practitioner

## 2022-11-23 VITALS — BP 128/72 | HR 55 | Temp 98.2°F | Ht 62.0 in | Wt 127.8 lb

## 2022-11-23 DIAGNOSIS — D0511 Intraductal carcinoma in situ of right breast: Secondary | ICD-10-CM

## 2022-11-23 DIAGNOSIS — I10 Essential (primary) hypertension: Secondary | ICD-10-CM | POA: Diagnosis not present

## 2022-11-23 DIAGNOSIS — H8309 Labyrinthitis, unspecified ear: Secondary | ICD-10-CM

## 2022-11-23 DIAGNOSIS — E7800 Pure hypercholesterolemia, unspecified: Secondary | ICD-10-CM | POA: Diagnosis not present

## 2022-11-23 LAB — LIPID PANEL
Cholesterol: 163 mg/dL (ref 0–200)
HDL: 72.5 mg/dL (ref >39.00)
LDL Cholesterol: 73 mg/dL (ref 0–99)
NonHDL: 90.47
Total CHOL/HDL Ratio: 2
Triglycerides: 88 mg/dL (ref 0.0–149.0)
VLDL: 17.6 mg/dL (ref 0.0–40.0)

## 2022-11-23 LAB — COMPREHENSIVE METABOLIC PANEL
ALT: 22 U/L (ref 0–35)
AST: 21 U/L (ref 0–37)
Albumin: 3.8 g/dL (ref 3.5–5.2)
Alkaline Phosphatase: 47 U/L (ref 39–117)
BUN: 18 mg/dL (ref 6–23)
CO2: 33 mEq/L — ABNORMAL HIGH (ref 19–32)
Calcium: 10.1 mg/dL (ref 8.4–10.5)
Chloride: 102 mEq/L (ref 96–112)
Creatinine, Ser: 0.8 mg/dL (ref 0.40–1.20)
GFR: 75.93 mL/min (ref >60.00)
Glucose, Bld: 89 mg/dL (ref 70–99)
Potassium: 3.3 mEq/L — ABNORMAL LOW (ref 3.5–5.1)
Sodium: 143 mEq/L (ref 135–145)
Total Bilirubin: 0.6 mg/dL (ref 0.2–1.2)
Total Protein: 6.2 g/dL (ref 6.0–8.3)

## 2022-11-23 LAB — CBC WITH DIFFERENTIAL/PLATELET
Basophils Absolute: 0.1 10*3/uL (ref 0.0–0.1)
Basophils Relative: 1.2 % (ref 0.0–3.0)
Eosinophils Absolute: 0.1 10*3/uL (ref 0.0–0.7)
Eosinophils Relative: 1.4 % (ref 0.0–5.0)
HCT: 37.4 % (ref 36.0–46.0)
Hemoglobin: 13.1 g/dL (ref 12.0–15.0)
Lymphocytes Relative: 29.3 % (ref 12.0–46.0)
Lymphs Abs: 1.5 10*3/uL (ref 0.7–4.0)
MCHC: 35 g/dL (ref 30.0–36.0)
MCV: 92.7 fl (ref 78.0–100.0)
Monocytes Absolute: 0.4 10*3/uL (ref 0.1–1.0)
Monocytes Relative: 7.2 % (ref 3.0–12.0)
Neutro Abs: 3.1 10*3/uL (ref 1.4–7.7)
Neutrophils Relative %: 60.9 % (ref 43.0–77.0)
Platelets: 289 10*3/uL (ref 150.0–400.0)
RBC: 4.03 Mil/uL (ref 3.87–5.11)
RDW: 13.1 % (ref 11.5–15.5)
WBC: 5.1 10*3/uL (ref 4.0–10.5)

## 2022-11-23 MED ORDER — MECLIZINE HCL 25 MG PO TABS: 25.0000 mg | ORAL_TABLET | Freq: Three times a day (TID) | ORAL | 1 refills | Status: DC | PRN

## 2022-11-23 MED ORDER — LOSARTAN POTASSIUM-HCTZ 100-12.5 MG PO TABS: 1.0000 | ORAL_TABLET | Freq: Every day | ORAL | 1 refills | Status: DC

## 2022-11-23 NOTE — Patient Instructions (Signed)
It was great to see you!  We are checking your labs today and will let you know the results via mychart/phone.   I have refilled your medications  Let's follow-up in 6 months, sooner if you have concerns.  If a referral was placed today, you will be contacted for an appointment. Please note that routine referrals can sometimes take up to 3-4 weeks to process. Please call our office if you haven't heard anything after this time frame.  Take care,  Vance Peper, NP

## 2022-11-23 NOTE — Assessment & Plan Note (Signed)
Continue atorvastatin 10 mg daily.  We will check CMP, CBC, lipid panel and adjust regimen based on results.

## 2022-11-23 NOTE — Assessment & Plan Note (Signed)
She is following with oncology and doing well. She has a little over 1 year left of taking evista. Last mammogram normal.

## 2022-11-23 NOTE — Progress Notes (Signed)
Established Patient Office Visit  Subjective   Patient ID: Melissa Mcclure, female    DOB: 06-07-1956  Age: 67 y.o. MRN: [id number]  Chief Complaint  Patient presents with   Medical Management of Chronic Issues    With fasting lab work    HPI  Melissa Mcclure is here to follow-up on hypertension, hyperlipidemia, and breast cancer.   She is doing well. She denies chest pain, shortness of breath, and chest pain. She has been following with oncology and is on evista for 1 more year. Her last mammogram was normal.   Current Outpatient Medications on File Prior to Visit  Medication Sig Dispense Refill   Ascorbic Acid (VITAMIN C WITH ROSE HIPS) 1000 MG tablet Take 1,000 mg by mouth daily.     atorvastatin (LIPITOR) 10 MG tablet TAKE 1 TABLET BY MOUTH EVERY DAY 90 tablet 3   cetirizine (ZYRTEC) 10 MG tablet Take 10 mg by mouth daily.     psyllium (REGULOID) 0.52 g capsule Take 0.52 g by mouth daily.     raloxifene (EVISTA) 60 MG tablet TAKE 1 TABLET BY MOUTH EVERY DAY 90 tablet 3   Turmeric 500 MG CAPS Take by mouth.     acetaminophen (TYLENOL) 500 MG tablet Take 2 tablets (1,000 mg total) by mouth every 8 (eight) hours for 14 days. 84 tablet 0   Multiple Vitamins-Minerals (ALIVE WOMENS 50+) TABS Take 1 tablet by mouth daily. (Patient not taking: Reported on 11/23/2022)     No current facility-administered medications on file prior to visit.      ROS See pertinent positives and negatives per HPI.    Objective:     BP 128/72 (BP Location: Right Arm)   Pulse (!) 55   Temp 98.2 F (36.8 C)   Ht '5\' 2"'$  (1.575 m)   Wt 127 lb 12.8 oz (58 kg)   LMP 06/24/2008   SpO2 99%   BMI 23.37 kg/m    Physical Exam Vitals and nursing note reviewed.  Constitutional:      General: She is not in acute distress.    Appearance: Normal appearance.  HENT:     Head: Normocephalic.  Eyes:     Conjunctiva/sclera: Conjunctivae normal.  Cardiovascular:     Rate and Rhythm: Normal rate and regular  rhythm.     Pulses: Normal pulses.     Heart sounds: Normal heart sounds.  Pulmonary:     Effort: Pulmonary effort is normal.     Breath sounds: Normal breath sounds.  Musculoskeletal:     Cervical back: Normal range of motion.  Skin:    General: Skin is warm.  Neurological:     General: No focal deficit present.     Mental Status: She is alert and oriented to person, place, and time.  Psychiatric:        Mood and Affect: Mood normal.        Behavior: Behavior normal.        Thought Content: Thought content normal.        Judgment: Judgment normal.    The 10-year ASCVD risk score (Arnett DK, et al., 2019) is: 8.7%    Assessment & Plan:   Problem List Items Addressed This Visit       Cardiovascular and Mediastinum   Essential hypertension - Primary    Chronic, stable.  Continue losartan-hydrochlorothiazide 100-12.5 mg daily.  Check CMP, CBC, lipid panel today.  Follow-up 6 months.      Relevant  Medications   losartan-hydrochlorothiazide (HYZAAR) 100-12.5 MG tablet   Other Relevant Orders   CBC with Differential/Platelet   Lipid panel   Comprehensive metabolic panel     Nervous and Auditory   Labyrinthitis    Chronic, stable. Continue meclizine '25mg'$  TID prn. Refill sent to the pharmacy.       Relevant Medications   meclizine (ANTIVERT) 25 MG tablet     Other   Ductal carcinoma in situ (DCIS) of right breast    She is following with oncology and doing well. She has a little over 1 year left of taking evista. Last mammogram normal.       Relevant Medications   meclizine (ANTIVERT) 25 MG tablet   Elevated LDL cholesterol level    Continue atorvastatin 10 mg daily.  We will check CMP, CBC, lipid panel and adjust regimen based on results.      Relevant Orders   CBC with Differential/Platelet   Lipid panel   Comprehensive metabolic panel    Return in about 6 months (around 05/26/2023) for CPE.    Charyl Dancer, NP

## 2022-11-23 NOTE — Assessment & Plan Note (Signed)
Chronic, stable. Continue meclizine '25mg'$  TID prn. Refill sent to the pharmacy.

## 2022-11-23 NOTE — Assessment & Plan Note (Signed)
Chronic, stable.  Continue losartan-hydrochlorothiazide 100-12.5 mg daily.  Check CMP, CBC, lipid panel today.  Follow-up 6 months.

## 2022-11-25 ENCOUNTER — Encounter: Payer: Self-pay | Admitting: Nurse Practitioner

## 2022-11-26 DIAGNOSIS — M6281 Muscle weakness (generalized): Secondary | ICD-10-CM | POA: Diagnosis not present

## 2022-11-26 DIAGNOSIS — Z4789 Encounter for other orthopedic aftercare: Secondary | ICD-10-CM | POA: Diagnosis not present

## 2022-11-26 DIAGNOSIS — M25612 Stiffness of left shoulder, not elsewhere classified: Secondary | ICD-10-CM | POA: Diagnosis not present

## 2022-11-26 DIAGNOSIS — M25512 Pain in left shoulder: Secondary | ICD-10-CM | POA: Diagnosis not present

## 2022-11-27 NOTE — Telephone Encounter (Signed)
Ok to transfer care. Please obtain records from [facility] (seen by Dr. Watt Climes) for review ahead of time.

## 2022-12-20 DIAGNOSIS — Z780 Asymptomatic menopausal state: Secondary | ICD-10-CM | POA: Diagnosis not present

## 2022-12-20 DIAGNOSIS — Z1231 Encounter for screening mammogram for malignant neoplasm of breast: Secondary | ICD-10-CM | POA: Diagnosis not present

## 2022-12-20 LAB — HM DEXA SCAN

## 2022-12-24 ENCOUNTER — Encounter: Payer: Self-pay | Admitting: Nurse Practitioner

## 2022-12-24 ENCOUNTER — Telehealth: Payer: Self-pay

## 2022-12-24 NOTE — Telephone Encounter (Signed)
I called patient and notified her of Bone density scan results. Pt expresses understanding. No further questions or concerns.

## 2023-01-13 DIAGNOSIS — R519 Headache, unspecified: Secondary | ICD-10-CM | POA: Diagnosis not present

## 2023-01-13 DIAGNOSIS — R051 Acute cough: Secondary | ICD-10-CM | POA: Diagnosis not present

## 2023-01-13 DIAGNOSIS — J101 Influenza due to other identified influenza virus with other respiratory manifestations: Secondary | ICD-10-CM | POA: Diagnosis not present

## 2023-01-13 DIAGNOSIS — M791 Myalgia, unspecified site: Secondary | ICD-10-CM | POA: Diagnosis not present

## 2023-01-13 DIAGNOSIS — U071 COVID-19: Secondary | ICD-10-CM | POA: Diagnosis not present

## 2023-01-14 ENCOUNTER — Other Ambulatory Visit: Payer: Self-pay | Admitting: Hematology

## 2023-01-24 ENCOUNTER — Encounter: Payer: Self-pay | Admitting: Gastroenterology

## 2023-02-07 ENCOUNTER — Encounter (HOSPITAL_BASED_OUTPATIENT_CLINIC_OR_DEPARTMENT_OTHER): Payer: Self-pay | Admitting: *Deleted

## 2023-02-14 DIAGNOSIS — F411 Generalized anxiety disorder: Secondary | ICD-10-CM | POA: Diagnosis not present

## 2023-02-20 DIAGNOSIS — F411 Generalized anxiety disorder: Secondary | ICD-10-CM | POA: Diagnosis not present

## 2023-03-05 DIAGNOSIS — M546 Pain in thoracic spine: Secondary | ICD-10-CM | POA: Diagnosis not present

## 2023-03-05 DIAGNOSIS — M419 Scoliosis, unspecified: Secondary | ICD-10-CM | POA: Diagnosis not present

## 2023-03-06 DIAGNOSIS — F411 Generalized anxiety disorder: Secondary | ICD-10-CM | POA: Diagnosis not present

## 2023-03-10 ENCOUNTER — Other Ambulatory Visit: Payer: Self-pay | Admitting: Nurse Practitioner

## 2023-03-11 ENCOUNTER — Other Ambulatory Visit: Payer: Self-pay

## 2023-03-11 MED ORDER — RALOXIFENE HCL 60 MG PO TABS: 60.0000 mg | ORAL_TABLET | Freq: Every day | ORAL | 0 refills | Status: DC

## 2023-03-19 DIAGNOSIS — M4316 Spondylolisthesis, lumbar region: Secondary | ICD-10-CM | POA: Diagnosis not present

## 2023-03-19 DIAGNOSIS — M419 Scoliosis, unspecified: Secondary | ICD-10-CM | POA: Diagnosis not present

## 2023-03-19 DIAGNOSIS — M546 Pain in thoracic spine: Secondary | ICD-10-CM | POA: Diagnosis not present

## 2023-03-27 DIAGNOSIS — F411 Generalized anxiety disorder: Secondary | ICD-10-CM | POA: Diagnosis not present

## 2023-04-11 DIAGNOSIS — M5416 Radiculopathy, lumbar region: Secondary | ICD-10-CM | POA: Diagnosis not present

## 2023-04-16 DIAGNOSIS — F411 Generalized anxiety disorder: Secondary | ICD-10-CM | POA: Diagnosis not present

## 2023-04-29 ENCOUNTER — Encounter: Payer: Self-pay | Admitting: Gastroenterology

## 2023-04-29 ENCOUNTER — Ambulatory Visit: Payer: BC Managed Care – PPO | Admitting: Gastroenterology

## 2023-04-29 VITALS — BP 126/62 | HR 58 | Ht 62.0 in | Wt 136.0 lb

## 2023-04-29 DIAGNOSIS — Z800 Family history of malignant neoplasm of digestive organs: Secondary | ICD-10-CM

## 2023-04-29 DIAGNOSIS — R197 Diarrhea, unspecified: Secondary | ICD-10-CM | POA: Diagnosis not present

## 2023-04-29 DIAGNOSIS — R12 Heartburn: Secondary | ICD-10-CM | POA: Diagnosis not present

## 2023-04-29 DIAGNOSIS — R1310 Dysphagia, unspecified: Secondary | ICD-10-CM

## 2023-04-29 MED ORDER — NA SULFATE-K SULFATE-MG SULF 17.5-3.13-1.6 GM/177ML PO SOLN: 1.0000 | Freq: Once | ORAL | 0 refills | Status: AC

## 2023-04-29 NOTE — Progress Notes (Unsigned)
Chief Complaint: Dysphagia, diarrhea   Referring Provider:     Jerene Bears, MD   HPI:     Melissa Mcclure is a 67 y.o. female with medical history as below referred to the Gastroenterology Clinic for evaluation of dysphagia.  Has been having intermittent solid food dysphagia a couple times per month. Dysphagia present intermitntely for a few years, and was present prior to surgery, pointing to suprasternal notch and upper sternum. Worse with chicken, rice.   Has occasional heartburn 1/month. Had been on Protonix for years prior to surgery, and now***   Last EGD was years ago no record in EMR for review.   Underwent robotic hiatal hernia repair and toupee fundoplication by Dr. Derrell Lolling in 03/2021.  UGI series in 12/2020 for preoperative evaluation was notable for large fixed hiatal hernia, normal gastric emptying, mild reflux, severe esophageal dysmotility with significant weakening of primary peristalsis, suggestive of chronic reflux related dysmotility.  Separately, has been having postprandial diarrhea. Has been present for many years. Tends to be worse with sweet foods. Sxs occur a few days.month. Takes Metamucil daily.  Will use Imodium when traveling with good effect. Has not trialed any dietary modifications.    Has previously followed with Dr. Ewing Schlein at [facility], most recently seen on 01/25/2023.  Endoscopy reports as below, but otherwise outpatient clinic notes not available for review.  Mother with colon cancer. Daughter with "thousands of polyps" and had a total colectomy with J pouch.    Endoscopic History: - 08/2014: Colonoscopy (Dr. Ewing Schlein): Internal/external hemorrhoids, sigmoid diverticulosis, large amount of stool with incomplete clearance and poor visualization.  Repeat in 3 years - 05/2018: Colonoscopy (Dr. Ewing Schlein): Internal/external hemorrhoids, sigmoid diverticulosis, otherwise normal.  Repeat 5 years due to family history.   Past Medical History:   Diagnosis Date   Arthritis    Cancer (HCC) 04/2010   melanoma-left knee, right arm   Chronic back pain    Complication of anesthesia    Ductal carcinoma in situ (DCIS) of right breast 10/2017   Family history of adverse reaction to anesthesia    mother has PONV   Family history of colon cancer    Family history of melanoma    Family history of ovarian cancer    Genetic testing 12/23/2017   STAT Breast panel with reflex to Multi-Cancer panel (83 genes) @ [facility] - No pathogenic mutations detected   GERD (gastroesophageal reflux disease)    H/O hiatal hernia    Heart murmur    no problems   History of hand surgery    Hyperlipidemia    Hypertension    Left rotator cuff tear    Malignant melanoma of lower leg (HCC) 03/10/2015   Melanoma of right upper arm (HCC) 10/12/2013   PONV (postoperative nausea and vomiting)    "zofran works great   Seasonal allergies    Vertigo      Past Surgical History:  Procedure Laterality Date   ABDOMINAL HYSTERECTOMY     ANTERIOR LAT LUMBAR FUSION Right 04/17/2013   Procedure: ANTERIOR LATERAL LUMBAR FUSION 3 LEVELS;  Surgeon: Maeola Harman, MD;  Location: [facility];  Service: Neurosurgery;  Laterality: Right;  Right Lumbar Two-Three Lumbar Three-Four Lumbar Four-Five  Anterolateral Fusion   BACK SURGERY     BREAST LUMPECTOMY WITH RADIOACTIVE SEED LOCALIZATION Right 12/25/2017   Procedure: RIGHT BREAST LUMPECTOMY WITH RADIOACTIVE SEED LOCALIZATION;  Surgeon: Claud Kelp, MD;  Location: [facility];  Service: General;  Laterality: Right;   BREAST SURGERY     CESAREAN SECTION  1978   CESAREAN SECTION  1980   COLONOSCOPY     COLONOSCOPY WITH PROPOFOL N/A 06/23/2018   Procedure: COLONOSCOPY WITH PROPOFOL;  Surgeon: Vida Rigger, MD;  Location: [facility];  Service: Endoscopy;  Laterality: N/A;   CYSTOSCOPY N/A 06/26/2016   Procedure: CYSTOSCOPY;  Surgeon: Jerene Bears, MD;  Location: [facility];  Service: Gynecology;  Laterality:  N/A;   DILATATION & CURETTAGE/HYSTEROSCOPY WITH MYOSURE N/A 01/24/2016   Procedure: DILATATION & CURETTAGE/HYSTEROSCOPY WITH MYOSURE;  Surgeon: Jerene Bears, MD;  Location: [facility];  Service: Gynecology;  Laterality: N/A;   FOOT SURGERY Right    early 20s-bone spur removal   HAND SURGERY Bilateral    for basal joint arthritis   HERNIA REPAIR     LAPAROSCOPIC HYSTERECTOMY Bilateral 06/26/2016   Procedure: HYSTERECTOMY TOTAL LAPAROSCOPIC;  Surgeon: Jerene Bears, MD;  Location: [facility];  Service: Gynecology;  Laterality: Bilateral;   LUMBAR PERCUTANEOUS PEDICLE SCREW 3 LEVEL N/A 04/17/2013   Procedure: LUMBAR PERCUTANEOUS PEDICLE SCREW 3 LEVEL;  Surgeon: Maeola Harman, MD;  Location: [facility];  Service: Neurosurgery;  Laterality: N/A;  Decompression and Posterior Lumbar Interbody Fusion of Lumbar Five-Sacral One, Percutaneous Screws Lumbar Two through Sacral One   MELANOMA EXCISION     knee 08 lft. elbow 11rt   SALPINGOOPHORECTOMY Bilateral 06/26/2016   Procedure: SALPINGO OOPHORECTOMY;  Surgeon: Jerene Bears, MD;  Location: [facility];  Service: Gynecology;  Laterality: Bilateral;   SHOULDER SURGERY Right 04/28/2015   SHOULDER SURGERY Left 2023   STRABISMUS SURGERY Bilateral 07/29/2020   Procedure: BILATERAL STRABISMUS REPAIR;  Surgeon: Verne Carrow, MD;  Location: [facility];  Service: Ophthalmology;  Laterality: Bilateral;   TUBAL LIGATION  1986   [facility]  06/17/2007   and left groin lymph node   XI ROBOTIC ASSISTED HIATAL HERNIA REPAIR N/A 04/04/2021   Procedure: ROBOTIC HIATAL HERNIA REPAIR WITH FUNDOPLICATION;  Surgeon: Axel Filler, MD;  Location: [facility];  Service: General;  Laterality: N/A;   Family History  Problem Relation Age of Onset   Diabetes Father        borderline   Melanoma Father        dx 30s/60s; deceased 31s   Congestive Heart Failure Father    Colon cancer Mother        dx 6s; deceased 81   Hypertension Mother    Osteoporosis Mother    Ovarian  cancer Maternal Grandmother        deceased 43s   Heart disease Paternal Grandfather    Melanoma Daughter        on leg; dx 30s   Social History   Tobacco Use   Smoking status: Never   Smokeless tobacco: Never  Vaping Use   Vaping status: Never Used  Substance Use Topics   Alcohol use: Yes    Alcohol/week: 0.0 - 1.0 standard drinks of alcohol    Comment: socially   Drug use: No   Current Outpatient Medications  Medication Sig Dispense Refill   Ascorbic Acid (VITAMIN C WITH ROSE HIPS) 1000 MG tablet Take 1,000 mg by mouth daily.     atorvastatin (LIPITOR) 10 MG tablet TAKE 1 TABLET BY MOUTH EVERY DAY 90 tablet 3   cetirizine (ZYRTEC) 10 MG tablet Take 10 mg by mouth daily.     diclofenac (CATAFLAM) 50 MG tablet Take 50  mg by mouth 3 (three) times daily.     losartan-hydrochlorothiazide (HYZAAR) 100-12.5 MG tablet Take 1 tablet by mouth daily. Reported on 01/23/2016 90 tablet 1   meclizine (ANTIVERT) 25 MG tablet Take 1 tablet (25 mg total) by mouth 3 (three) times daily as needed (vertigo). Reported on 01/23/2016 30 tablet 1   Multiple Vitamins-Minerals (ALIVE WOMENS 50+) TABS Take 1 tablet by mouth daily.     psyllium (REGULOID) 0.52 g capsule Take 0.52 g by mouth daily.     raloxifene (EVISTA) 60 MG tablet Take 1 tablet (60 mg total) by mouth daily. 60 tablet 0   acetaminophen (TYLENOL) 500 MG tablet Take 2 tablets (1,000 mg total) by mouth every 8 (eight) hours for 14 days. 84 tablet 0   Turmeric 500 MG CAPS Take by mouth. (Patient not taking: Reported on 04/29/2023)     No current facility-administered medications for this visit.   Allergies  Allergen Reactions   Elemental Sulfur Hives     Review of Systems: All systems reviewed and negative except where noted in HPI.     Physical Exam:    Wt Readings from Last 3 Encounters:  04/29/23 136 lb (61.7 kg)  11/23/22 127 lb 12.8 oz (58 kg)  11/20/22 128 lb 9.6 oz (58.3 kg)    BP 126/62   Pulse (!) 58   Ht 5\' 2"  (1.575  m)   Wt 136 lb (61.7 kg)   LMP 06/24/2008   BMI 24.87 kg/m  Constitutional:  Pleasant, in no acute distress. Psychiatric: Normal mood and affect. Behavior is normal. EENT: Pupils normal.  Conjunctivae are normal. No scleral icterus. Neck supple. No cervical LAD. Cardiovascular: Normal rate, regular rhythm. No edema Pulmonary/chest: Effort normal and breath sounds normal. No wheezing, rales or rhonchi. Abdominal: Soft, nondistended, nontender. Bowel sounds active throughout. There are no masses palpable. No hepatomegaly. Neurological: Alert and oriented to person place and time. Skin: Skin is warm and dry. No rashes noted.   ASSESSMENT AND PLAN;   1)  - 2-day prep - Low FODMAP diet - e/c    Shellia Cleverly, DO, FACG  04/29/2023, 11:02 AM   Jerene Bears, MD

## 2023-04-29 NOTE — Patient Instructions (Addendum)
You have been scheduled for an endoscopy and colonoscopy. Please follow the written instructions given to you at your visit today.  Please pick up your prep supplies at the pharmacy within the next 1-3 days.  If you use inhalers (even only as needed), please bring them with you on the day of your procedure.  DO NOT TAKE 7 DAYS PRIOR TO TEST- Trulicity (dulaglutide) Ozempic, Wegovy (semaglutide) Mounjaro (tirzepatide) Bydureon Bcise (exanatide extended release)  DO NOT TAKE 1 DAY PRIOR TO YOUR TEST Rybelsus (semaglutide) Adlyxin (lixisenatide) Victoza (liraglutide) Byetta (exanatide) ___________________________________________________________________________  Due to recent changes in healthcare laws, you may see the results of your imaging and laboratory studies on MyChart before your provider has had a chance to review them.  We understand that in some cases there may be results that are confusing or concerning to you. Not all laboratory results come back in the same time frame and the provider may be waiting for multiple results in order to interpret others.  Please give [place] 48 hours in order for your provider to thoroughly review all the results before contacting the office for clarification of your results.   _______________________________________________________  If your blood pressure at your visit was 140/90 or greater, please contact your primary care physician to follow up on this.  _______________________________________________________  If you are age 35 or older, your body mass index should be between 23-30. Your Body mass index is 24.87 kg/m. If this is out of the aforementioned range listed, please consider follow up with your Primary Care Provider. __________________________________________________________  The [facility] providers would like to encourage you to use [facility] to communicate with providers for non-urgent requests or questions.  Due to long hold times on the  telephone, sending your provider a message by [facility] may be a faster and more efficient way to get a response.  Please allow 48 business hours for a response.  Please remember that this is for non-urgent requests.    Thank you for choosing me and [facility].  Vito Cirigliano, D.O.

## 2023-05-07 DIAGNOSIS — M4185 Other forms of scoliosis, thoracolumbar region: Secondary | ICD-10-CM | POA: Diagnosis not present

## 2023-05-07 DIAGNOSIS — M4316 Spondylolisthesis, lumbar region: Secondary | ICD-10-CM | POA: Diagnosis not present

## 2023-05-07 DIAGNOSIS — M47814 Spondylosis without myelopathy or radiculopathy, thoracic region: Secondary | ICD-10-CM | POA: Diagnosis not present

## 2023-05-07 DIAGNOSIS — M546 Pain in thoracic spine: Secondary | ICD-10-CM | POA: Diagnosis not present

## 2023-05-07 DIAGNOSIS — M419 Scoliosis, unspecified: Secondary | ICD-10-CM | POA: Diagnosis not present

## 2023-05-07 DIAGNOSIS — Z0189 Encounter for other specified special examinations: Secondary | ICD-10-CM | POA: Diagnosis not present

## 2023-05-07 DIAGNOSIS — M4183 Other forms of scoliosis, cervicothoracic region: Secondary | ICD-10-CM | POA: Diagnosis not present

## 2023-05-07 DIAGNOSIS — K449 Diaphragmatic hernia without obstruction or gangrene: Secondary | ICD-10-CM | POA: Diagnosis not present

## 2023-05-07 NOTE — Progress Notes (Signed)
[facility] - Clinic Note  05/21/2023     CHIEF COMPLAINT Patient presents for Retina Follow Up   HISTORY OF PRESENT ILLNESS: Melissa Mcclure is a 67 y.o. female who presents to the clinic today for:   HPI     Retina Follow Up   Patient presents with  Other.  In both eyes.  Severity is moderate.  Duration of 9 months.  Since onset it is stable.  I, the attending physician,  performed the HPI with the patient and updated documentation appropriately.        Comments   Patient states vision the same OU. No new floaters or flashes. Has floaters OU, but no worse than before.      Last edited by Rennis Chris, MD on 05/23/2023  2:08 AM.    Pt states she has a lot of blurry vision, she is not using any drops   Referring physician: [name], NP [address] [place],  [place] [address]  HISTORICAL INFORMATION:   Selected notes from the MEDICAL RECORD NUMBER Referred by Dr. Zenaida Niece for concern of retinoschisis OS LEE:  Ocular Hx- PMH-    CURRENT MEDICATIONS: No current outpatient medications on file. (Ophthalmic Drugs)   No current facility-administered medications for this visit. (Ophthalmic Drugs)   Current Outpatient Medications (Other)  Medication Sig   Ascorbic Acid (VITAMIN C WITH ROSE HIPS) 1000 MG tablet Take 1,000 mg by mouth daily.   atorvastatin (LIPITOR) 10 MG tablet TAKE 1 TABLET BY MOUTH EVERY DAY   cetirizine (ZYRTEC) 10 MG tablet Take 10 mg by mouth daily.   diclofenac (CATAFLAM) 50 MG tablet Take 50 mg by mouth 3 (three) times daily.   losartan-hydrochlorothiazide (HYZAAR) 100-12.5 MG tablet Take 1 tablet by mouth daily. Reported on 01/23/2016   meclizine (ANTIVERT) 25 MG tablet Take 1 tablet (25 mg total) by mouth 3 (three) times daily as needed (vertigo). Reported on 01/23/2016   mirabegron ER (MYRBETRIQ) 25 MG TB24 tablet Take 1 tablet (25 mg total) by mouth daily.   Multiple Vitamins-Minerals (ALIVE WOMENS 50+) TABS Take 1  tablet by mouth daily.   pregabalin (LYRICA) 50 MG capsule Take 50 mg by mouth 3 (three) times daily.   psyllium (REGULOID) 0.52 g capsule Take 0.52 g by mouth daily.   raloxifene (EVISTA) 60 MG tablet Take 1 tablet (60 mg total) by mouth daily.   Turmeric 500 MG CAPS Take by mouth. (Patient not taking: Reported on 05/20/2023)   No current facility-administered medications for this visit. (Other)   REVIEW OF SYSTEMS: ROS   Positive for: Cardiovascular, Eyes Negative for: Constitutional, Gastrointestinal, Neurological, Skin, Genitourinary, Musculoskeletal, HENT, Endocrine, Respiratory, Psychiatric, Allergic/Imm, Heme/Lymph Last edited by Annalee Genta D, COT on 05/21/2023 12:55 PM.      ALLERGIES Allergies  Allergen Reactions   Elemental Sulfur Hives   PAST MEDICAL HISTORY Past Medical History:  Diagnosis Date   Arthritis    Cancer (HCC) 04/2010   melanoma-left knee, right arm   Chronic back pain    Complication of anesthesia    Ductal carcinoma in situ (DCIS) of right breast 10/2017   Family history of adverse reaction to anesthesia    mother has PONV   Family history of colon cancer    Family history of melanoma    Family history of ovarian cancer    Genetic testing 12/23/2017   STAT Breast panel with reflex to Multi-Cancer panel (83 genes) @ [facility] - No pathogenic  mutations detected   GERD (gastroesophageal reflux disease)    H/O hiatal hernia    Heart murmur    no problems   History of hand surgery    Hyperlipidemia    Hypertension    Left rotator cuff tear    Malignant melanoma of lower leg (HCC) 03/10/2015   Melanoma of right upper arm (HCC) 10/12/2013   PONV (postoperative nausea and vomiting)    "zofran works great   Seasonal allergies    Vertigo    Past Surgical History:  Procedure Laterality Date   ANTERIOR LAT LUMBAR FUSION Right 04/17/2013   Procedure: ANTERIOR LATERAL LUMBAR FUSION 3 LEVELS;  Surgeon: Maeola Harman, MD;  Location: [facility];   Service: Neurosurgery;  Laterality: Right;  Right Lumbar Two-Three Lumbar Three-Four Lumbar Four-Five  Anterolateral Fusion   BACK SURGERY     BREAST LUMPECTOMY WITH RADIOACTIVE SEED LOCALIZATION Right 12/25/2017   Procedure: RIGHT BREAST LUMPECTOMY WITH RADIOACTIVE SEED LOCALIZATION;  Surgeon: Claud Kelp, MD;  Location: [facility];  Service: General;  Laterality: Right;   BREAST SURGERY     CESAREAN SECTION  1978   CESAREAN SECTION  1980   COLONOSCOPY     COLONOSCOPY WITH PROPOFOL N/A 06/23/2018   Procedure: COLONOSCOPY WITH PROPOFOL;  Surgeon: Vida Rigger, MD;  Location: [facility];  Service: Endoscopy;  Laterality: N/A;   CYSTOSCOPY N/A 06/26/2016   Procedure: CYSTOSCOPY;  Surgeon: Jerene Bears, MD;  Location: [facility];  Service: Gynecology;  Laterality: N/A;   DILATATION & CURETTAGE/HYSTEROSCOPY WITH MYOSURE N/A 01/24/2016   Procedure: DILATATION & CURETTAGE/HYSTEROSCOPY WITH MYOSURE;  Surgeon: Jerene Bears, MD;  Location: [facility];  Service: Gynecology;  Laterality: N/A;   FOOT SURGERY Right    early 20s-bone spur removal   HAND SURGERY Bilateral    for basal joint arthritis   HERNIA REPAIR     LAPAROSCOPIC HYSTERECTOMY Bilateral 06/26/2016   Procedure: HYSTERECTOMY TOTAL LAPAROSCOPIC;  Surgeon: Jerene Bears, MD;  Location: [facility];  Service: Gynecology;  Laterality: Bilateral;   LUMBAR PERCUTANEOUS PEDICLE SCREW 3 LEVEL N/A 04/17/2013   Procedure: LUMBAR PERCUTANEOUS PEDICLE SCREW 3 LEVEL;  Surgeon: Maeola Harman, MD;  Location: [facility];  Service: Neurosurgery;  Laterality: N/A;  Decompression and Posterior Lumbar Interbody Fusion of Lumbar Five-Sacral One, Percutaneous Screws Lumbar Two through Sacral One   MELANOMA EXCISION     knee 08 lft. elbow 11rt   SALPINGOOPHORECTOMY Bilateral 06/26/2016   Procedure: SALPINGO OOPHORECTOMY;  Surgeon: Jerene Bears, MD;  Location: [facility];  Service: Gynecology;  Laterality: Bilateral;   SHOULDER SURGERY Right 04/28/2015    SHOULDER SURGERY Left 2023   STRABISMUS SURGERY Bilateral 07/29/2020   Procedure: BILATERAL STRABISMUS REPAIR;  Surgeon: Verne Carrow, MD;  Location: [facility];  Service: Ophthalmology;  Laterality: Bilateral;   TUBAL LIGATION  1986   [facility]  06/17/2007   and left groin lymph node   XI ROBOTIC ASSISTED HIATAL HERNIA REPAIR N/A 04/04/2021   Procedure: ROBOTIC HIATAL HERNIA REPAIR WITH FUNDOPLICATION;  Surgeon: Axel Filler, MD;  Location: [facility];  Service: General;  Laterality: N/A;   FAMILY HISTORY Family History  Problem Relation Age of Onset   Diabetes Father        borderline   Melanoma Father        dx 60s/60s; deceased 45s   Congestive Heart Failure Father    Colon cancer Mother        dx 36s; deceased 67  Hypertension Mother    Osteoporosis Mother    Ovarian cancer Maternal Grandmother        deceased 68s   Heart disease Paternal Grandfather    Melanoma Daughter        on leg; dx 30s   SOCIAL HISTORY Social History   Tobacco Use   Smoking status: Never   Smokeless tobacco: Never  Vaping Use   Vaping status: Never Used  Substance Use Topics   Alcohol use: Yes    Alcohol/week: 0.0 - 1.0 standard drinks of alcohol    Comment: socially   Drug use: No       OPHTHALMIC EXAM:  Base Eye Exam     Visual Acuity (Snellen - Linear)       Right Left   Dist [place] 20/30 -2 20/20 -2   Dist ph [place] 20/20 -2          Tonometry (Tonopen, 1:01 PM)       Right Left   Pressure 13 10         Pupils       Dark Light Shape React APD   Right 2 1 Round Brisk None   Left 2 1 Round Brisk None         Visual Fields (Counting fingers)       Left Right    Full Full         Extraocular Movement       Right Left    Full, Ortho Full, Ortho         Neuro/Psych     Oriented x3: Yes   Mood/Affect: Normal         Dilation     Both eyes: 1.0% Mydriacyl, 2.5% Phenylephrine @ 1:01 PM           Slit Lamp and Fundus Exam     Slit  Lamp Exam       Right Left   Lids/Lashes Dermatochalasis - upper lid, mild Ptosis Dermatochalasis - upper lid   Conjunctiva/Sclera nasal pingeucula Mild nasal and temporal pinguecula   Cornea 1+ Punctate epithelial erosions, tear film debris Trace Punctate epithelial erosions, mild tear film debris   Anterior Chamber deep, clear, narrow temporal angle deep, clear, narrow temporal angle   Iris Round and dilated Round and dilated   Lens 2+ Nuclear sclerosis, 2+ Cortical cataract, mild pigment deposition anterior capsule 2+ Nuclear sclerosis, 2+ Cortical cataract   Anterior Vitreous Vitreous syneresis mild syneresis, Posterior vitreous detachment, vitreous condensations         Fundus Exam       Right Left   Disc Pink and Sharp, +cupping, mild temporal PPA Pink and Sharp, +cupping, mild temporal PPA   C/D Ratio 0.7 0.6   Macula Flat, Good foveal reflex, mild RPE mottling, focal linear IRH IN mac, No edema Flat, Good foveal reflex, mild RPE mottling, No heme or edema   Vessels mild attenuation, mild tortuosity attenuated, mild tortuosity   Periphery Attached, focal CR atrophy at 0900, shallow schisis ST periphery, no RT/RD, No heme Attached, bullous schisis from 0130-0300, no RT/RD           IMAGING AND PROCEDURES  Imaging and Procedures for 05/21/2023  OCT, Retina - OU - Both Eyes       Right Eye Quality was good. Central Foveal Thickness: 278. Progression has been stable. Findings include normal foveal contour, no IRF, no SRF (Partial PVD, focal multilaminer schisis ST periphery caught on widefield -- not  imaged today).   Left Eye Quality was good. Central Foveal Thickness: 281. Progression has been stable. Findings include normal foveal contour, no IRF, no SRF (Bullous retinoschisis from 0100-0230 caught on widefield -- stable).   Notes *Images captured and stored on drive  Diagnosis / Impression:  NFP, no IRF/SRF OU OD: Partial PVD, focal multilaminer schisis ST periphery  caught on widefield -- not imaged today OS: Bullous retinoschisis from 0100-0230 caught on widefield -- stable from prior  Clinical management:  See below  Abbreviations: NFP - Normal foveal profile. CME - cystoid macular edema. PED - pigment epithelial detachment. IRF - intraretinal fluid. SRF - subretinal fluid. EZ - ellipsoid zone. ERM - epiretinal membrane. ORA - outer retinal atrophy. ORT - outer retinal tubulation. SRHM - subretinal hyper-reflective material. IRHM - intraretinal hyper-reflective material      Color Fundus Photography Optos - OU - Both Eyes       Right Eye Progression has no prior data. Disc findings include normal observations. Macula : hemorrhage (Focal linear IRH IN macula). Vessels : tortuous vessels. Periphery : RPE abnormality (Focal atrophic CR scar at 0900, ?focal shallow schisis 1030 periphery).   Left Eye Progression has no prior data. Disc findings include normal observations. Macula : normal observations. Vessels : tortuous vessels, attenuated. Periphery : (Peripheral schisis cavity ST quad (spanning 0130-0300)).   Notes **Images stored on drive**  Impression: OD: Focal atrophic CR scar at 0900, ?focal shallow schisis 1030 periphery OS: Peripheral schisis cavity ST quad (spanning 0130-0300)            ASSESSMENT/PLAN:    ICD-10-CM   1. Bilateral retinoschisis  H33.103 OCT, Retina - OU - Both Eyes    Color Fundus Photography Optos - OU - Both Eyes    2. Essential hypertension  I10     3. Hypertensive retinopathy of both eyes  H35.033     4. History of strabismus surgery  Z98.890     5. Combined forms of age-related cataract of both eyes  H25.813      1. Retinoschisis OU  - OD: shallow schisis ST periphery - OS: bullous schisis located from 0100-0230 - both stable without progression; no RT/RD OU - baseline Optos images obtained today, 08.27.24 - discussed findings, prognosis, treatment options - no retinal or ophthalmic  interventions indicated or recommended at this time - recommend monitoring - f/u 1 yr, sooner prn - DFE, Optos images, OCT -- **scan through schisis**  2,3. Hypertensive retinopathy OU - discussed importance of tight BP control - monitor  4. History of strabismus surgery  - pt reports history of XT  - s/p strabismus SX Dr. Maple Hudson (11.05.21)  5. Mixed Cataract OU - The symptoms of cataract, surgical options, and treatments and risks were discussed with patient. - discussed diagnosis and progression - under the expert management of Dr. Zenaida Niece  - clear from a retina standpoint to proceed with cataract surgery when pt and surgeon are ready   Ophthalmic Meds Ordered this visit:  No orders of the defined types were placed in this encounter.    Return in about 1 year (around 05/20/2024) for f/u retinoschisis OU, DFE, OCT.  There are no Patient Instructions on file for this visit.   Explained the diagnoses, plan, and follow up with the patient and they expressed understanding.  Patient expressed understanding of the importance of proper follow up care.   This document serves as a record of services personally performed by Karie Chimera, MD, PhD.  It was created on their behalf by Laurey Morale, COT an ophthalmic technician. The creation of this record is the provider's dictation and/or activities during the visit.    Electronically signed by:  Charlette Caffey, COT  05/23/23 2:09 AM   This document serves as a record of services personally performed by Karie Chimera, MD, PhD. It was created on their behalf by Glee Arvin. Manson Passey, OA an ophthalmic technician. The creation of this record is the provider's dictation and/or activities during the visit.    Electronically signed by: Glee Arvin. Manson Passey, OA 05/23/23 2:09 AM  Karie Chimera, M.D., Ph.D. Diseases & Surgery of the Retina and Vitreous [facility]  I have reviewed the above documentation for accuracy and  completeness, and I agree with the above. Karie Chimera, M.D., Ph.D. 05/23/23 2:12 AM  Abbreviations: M myopia (nearsighted); A astigmatism; H hyperopia (farsighted); P presbyopia; Mrx spectacle prescription;  CTL contact lenses; OD right eye; OS left eye; OU both eyes  XT exotropia; ET esotropia; PEK punctate epithelial keratitis; PEE punctate epithelial erosions; DES dry eye syndrome; MGD meibomian gland dysfunction; ATs artificial tears; PFAT's preservative free artificial tears; NSC nuclear sclerotic cataract; PSC posterior subcapsular cataract; ERM epi-retinal membrane; PVD posterior vitreous detachment; RD retinal detachment; DM diabetes mellitus; DR diabetic retinopathy; NPDR non-proliferative diabetic retinopathy; PDR proliferative diabetic retinopathy; CSME clinically significant macular edema; DME diabetic macular edema; dbh dot blot hemorrhages; CWS cotton wool spot; POAG primary open angle glaucoma; C/D cup-to-disc ratio; HVF humphrey visual field; GVF goldmann visual field; OCT optical coherence tomography; IOP intraocular pressure; BRVO Branch retinal vein occlusion; CRVO central retinal vein occlusion; CRAO central retinal artery occlusion; BRAO branch retinal artery occlusion; RT retinal tear; SB scleral buckle; PPV pars plana vitrectomy; VH Vitreous hemorrhage; PRP panretinal laser photocoagulation; IVK intravitreal kenalog; VMT vitreomacular traction; MH Macular hole;  NVD neovascularization of the disc; NVE neovascularization elsewhere; AREDS age related eye disease study; ARMD age related macular degeneration; POAG primary open angle glaucoma; EBMD epithelial/anterior basement membrane dystrophy; ACIOL anterior chamber intraocular lens; IOL intraocular lens; PCIOL posterior chamber intraocular lens; Phaco/IOL phacoemulsification with intraocular lens placement; PRK photorefractive keratectomy; LASIK laser assisted in situ keratomileusis; HTN hypertension; DM diabetes mellitus; COPD chronic  obstructive pulmonary disease

## 2023-05-08 DIAGNOSIS — F411 Generalized anxiety disorder: Secondary | ICD-10-CM | POA: Diagnosis not present

## 2023-05-09 ENCOUNTER — Encounter (INDEPENDENT_AMBULATORY_CARE_PROVIDER_SITE_OTHER): Payer: Self-pay

## 2023-05-17 ENCOUNTER — Encounter: Payer: Self-pay | Admitting: Obstetrics and Gynecology

## 2023-05-17 ENCOUNTER — Ambulatory Visit: Payer: BC Managed Care – PPO | Admitting: Obstetrics and Gynecology

## 2023-05-17 VITALS — BP 135/89 | HR 59 | Ht 60.75 in | Wt 129.0 lb

## 2023-05-17 DIAGNOSIS — R350 Frequency of micturition: Secondary | ICD-10-CM | POA: Diagnosis not present

## 2023-05-17 DIAGNOSIS — N393 Stress incontinence (female) (male): Secondary | ICD-10-CM | POA: Diagnosis not present

## 2023-05-17 DIAGNOSIS — R159 Full incontinence of feces: Secondary | ICD-10-CM | POA: Diagnosis not present

## 2023-05-17 DIAGNOSIS — N3281 Overactive bladder: Secondary | ICD-10-CM | POA: Diagnosis not present

## 2023-05-17 LAB — POCT URINALYSIS DIPSTICK
Bilirubin, UA: NEGATIVE
Blood, UA: NEGATIVE
Glucose, UA: NEGATIVE
Ketones, UA: NEGATIVE
Leukocytes, UA: NEGATIVE
Nitrite, UA: NEGATIVE
Protein, UA: NEGATIVE
Spec Grav, UA: 1.01 (ref 1.010–1.025)
Urobilinogen, UA: 0.2 E.U./dL
pH, UA: 7 (ref 5.0–8.0)

## 2023-05-17 MED ORDER — MIRABEGRON ER 25 MG PO TB24
25.0000 mg | ORAL_TABLET | Freq: Every day | ORAL | 5 refills | Status: DC
Start: 2023-05-17 — End: 2023-12-09

## 2023-05-17 NOTE — Patient Instructions (Addendum)
Today we talked about ways to manage bladder urgency such as altering your diet to avoid irritative beverages and foods (bladder diet) as well as attempting to decrease stress and other exacerbating factors.    The Most Bothersome Foods* The Least Bothersome Foods*  Coffee - Regular & Decaf Tea - caffeinated Carbonated beverages - cola, non-colas, diet & caffeine-free Alcohols - Beer, Red Wine, White Wine, [address] - Grapefruit, [place], Orange, [employer] - Cranberry, Grapefruit, Orange, Pineapple Vegetables - Tomato & Tomato Products Flavor Enhancers - Hot peppers, Spicy foods, Chili, Horseradish, Vinegar, Monosodium glutamate (MSG) Artificial Sweeteners - NutraSweet, Sweet 'N Low, Equal (sweetener), Saccharin Ethnic foods - [place], [place], [place] food [employer] - low-fat & whole Fruits - Bananas, Blueberries, Honeydew melon, Pears, Raisins, Watermelon Vegetables - Broccoli, [address] Sprouts, [place], Carrots, Cauliflower, Ulysses, Cucumber, Mushrooms, Peas, Radishes, Squash, Zucchini, White potatoes, Sweet potatoes & yams Poultry - Chicken, Eggs, [place], [employer] - Beef, Diplomatic Services operational officer, Lamb Seafood - Shrimp, [place] fish, Salmon Grains - Oat, Rice Snacks - Pretzels, Popcorn  *Lenward Chancellor et al. Diet and its role in interstitial cystitis/bladder pain syndrome (IC/BPS) and comorbid conditions. [employer]. BJU Int. 2012 Jan 11.   We discussed the symptoms of overactive bladder (OAB), which include urinary urgency, urinary frequency, night-time urination, with or without urge incontinence.  We discussed management including behavioral therapy (decreasing bladder irritants by following a bladder diet, urge suppression strategies, timed voids, bladder retraining), physical therapy, medication; and for refractory cases posterior tibial nerve stimulation, sacral neuromodulation, and intravesical botulinum toxin injection.   Prescribed Myrbetriq 25mg  daily. For Beta-3 agonist  medication, we discussed the potential side effect of elevated blood pressure which is more likely to occur in individuals with uncontrolled hypertension.   It can take a month to start working so give it time, but if you have bothersome side effects call sooner and we can try a different medication.  Call us if you have trouble filling the prescription or if it's not covered by your insurance.

## 2023-05-17 NOTE — Progress Notes (Signed)
[facility] New Patient Evaluation and Consultation  Referring Provider: Jerene Bears, MD PCP: [name], NP Date of Service: 05/17/2023  SUBJECTIVE Chief Complaint: New Patient (Initial Visit) Melissa Mcclure is a 67 y.o. female here for a consult for urinary and fecal incontinence.)  History of Present Illness: Melissa Mcclure is a 67 y.o. White or Caucasian female seen in consultation at the request of Dr. Hyacinth Meeker for evaluation of incontinence.    Review of records significant for: Leaks with cough or sneeze but also sometimes not aware of leakage.   Urinary Symptoms: Leaks urine with cough/ sneeze and without sensation Leaks 1-2 time(s) per day. Sometimes will have a large loss of urine on the way to the bathroom.  Pad use:  liners/ mini-pads prn  She is bothered by her UI symptoms.  Day time voids 5-6.  Nocturia: 0-1 times per night to void. Voiding dysfunction: she empties her bladder well.  does not use a catheter to empty bladder.  When urinating, she feels she has no difficulties Drinks: 1 cup coffee in AM, small juice, water, blueberry flavored drink (not carbonated) per day  UTIs:  0  UTI's in the last year.   Denies history of blood in urine and kidney or bladder stones  Pelvic Organ Prolapse Symptoms:                  She Denies a feeling of a bulge the vaginal area.   Bowel Symptom: Bowel movements: 1 time(s) per day Stool consistency: soft  Straining: no.  Splinting: no.  Incomplete evacuation: no.  She Admits to accidental bowel leakage / fecal incontinence  Occurs: after a bowel movement  Consistency with leakage: soft  Bowel regimen: fiber- metamucil daily Last colonoscopy: Date Dec 2019- negative. She is having a new colonoscopy in September- she has a concern for microscopic colitis due to loose stools. Has noticed some loose stools with certain foods (some dairy)  Sexual Function Sexually active: no.   Pelvic Pain Denies pelvic  pain    Past Medical History:  Past Medical History:  Diagnosis Date   Arthritis    Cancer (HCC) 04/2010   melanoma-left knee, right arm   Chronic back pain    Complication of anesthesia    Ductal carcinoma in situ (DCIS) of right breast 10/2017   Family history of adverse reaction to anesthesia    mother has PONV   Family history of colon cancer    Family history of melanoma    Family history of ovarian cancer    Genetic testing 12/23/2017   STAT Breast panel with reflex to Multi-Cancer panel (83 genes) @ [facility] - No pathogenic mutations detected   GERD (gastroesophageal reflux disease)    H/O hiatal hernia    Heart murmur    no problems   History of hand surgery    Hyperlipidemia    Hypertension    Left rotator cuff tear    Malignant melanoma of lower leg (HCC) 03/10/2015   Melanoma of right upper arm (HCC) 10/12/2013   PONV (postoperative nausea and vomiting)    "zofran works great   Seasonal allergies    Vertigo      Past Surgical History:   Past Surgical History:  Procedure Laterality Date   ANTERIOR LAT LUMBAR FUSION Right 04/17/2013   Procedure: ANTERIOR LATERAL LUMBAR FUSION 3 LEVELS;  Surgeon: Maeola Harman, MD;  Location: [facility];  Service: Neurosurgery;  Laterality: Right;  Right  Lumbar Two-Three Lumbar Three-Four Lumbar Four-Five  Anterolateral Fusion   BACK SURGERY     BREAST LUMPECTOMY WITH RADIOACTIVE SEED LOCALIZATION Right 12/25/2017   Procedure: RIGHT BREAST LUMPECTOMY WITH RADIOACTIVE SEED LOCALIZATION;  Surgeon: Claud Kelp, MD;  Location: [facility];  Service: General;  Laterality: Right;   BREAST SURGERY     CESAREAN SECTION  1978   CESAREAN SECTION  1980   COLONOSCOPY     COLONOSCOPY WITH PROPOFOL N/A 06/23/2018   Procedure: COLONOSCOPY WITH PROPOFOL;  Surgeon: Vida Rigger, MD;  Location: [facility];  Service: Endoscopy;  Laterality: N/A;   CYSTOSCOPY N/A 06/26/2016   Procedure: CYSTOSCOPY;  Surgeon: Jerene Bears,  MD;  Location: [facility];  Service: Gynecology;  Laterality: N/A;   DILATATION & CURETTAGE/HYSTEROSCOPY WITH MYOSURE N/A 01/24/2016   Procedure: DILATATION & CURETTAGE/HYSTEROSCOPY WITH MYOSURE;  Surgeon: Jerene Bears, MD;  Location: [facility];  Service: Gynecology;  Laterality: N/A;   FOOT SURGERY Right    early 20s-bone spur removal   HAND SURGERY Bilateral    for basal joint arthritis   HERNIA REPAIR     LAPAROSCOPIC HYSTERECTOMY Bilateral 06/26/2016   Procedure: HYSTERECTOMY TOTAL LAPAROSCOPIC;  Surgeon: Jerene Bears, MD;  Location: [facility];  Service: Gynecology;  Laterality: Bilateral;   LUMBAR PERCUTANEOUS PEDICLE SCREW 3 LEVEL N/A 04/17/2013   Procedure: LUMBAR PERCUTANEOUS PEDICLE SCREW 3 LEVEL;  Surgeon: Maeola Harman, MD;  Location: [facility];  Service: Neurosurgery;  Laterality: N/A;  Decompression and Posterior Lumbar Interbody Fusion of Lumbar Five-Sacral One, Percutaneous Screws Lumbar Two through Sacral One   MELANOMA EXCISION     knee 08 lft. elbow 11rt   SALPINGOOPHORECTOMY Bilateral 06/26/2016   Procedure: SALPINGO OOPHORECTOMY;  Surgeon: Jerene Bears, MD;  Location: [facility];  Service: Gynecology;  Laterality: Bilateral;   SHOULDER SURGERY Right 04/28/2015   SHOULDER SURGERY Left 2023   STRABISMUS SURGERY Bilateral 07/29/2020   Procedure: BILATERAL STRABISMUS REPAIR;  Surgeon: Verne Carrow, MD;  Location: [facility];  Service: Ophthalmology;  Laterality: Bilateral;   TUBAL LIGATION  1986   WLE  06/17/2007   and left groin lymph node   XI ROBOTIC ASSISTED HIATAL HERNIA REPAIR N/A 04/04/2021   Procedure: ROBOTIC HIATAL HERNIA REPAIR WITH FUNDOPLICATION;  Surgeon: Axel Filler, MD;  Location: [facility];  Service: General;  Laterality: N/A;     Past OB/GYN History: OB History  Gravida Para Term Preterm AB Living  3 3 1     3   SAB IAB Ectopic Multiple Live Births          3    # Outcome Date GA Lbr Len/2nd Weight Sex Type Anes PTL Lv  3 Para       CS-LTranv     2 Para      CS-LTranv     1 Term      Vag-Spont       S/p hysterectomy   Medications: She has a current medication list which includes the following prescription(s): vitamin c with rose hips, atorvastatin, cetirizine, diclofenac, losartan-hydrochlorothiazide, meclizine, mirabegron er, alive womens 50+, pregabalin, psyllium, raloxifene, and turmeric.   Allergies: Patient is allergic to elemental sulfur.   Social History:  Social History   Tobacco Use   Smoking status: Never   Smokeless tobacco: Never  Vaping Use   Vaping status: Never Used  Substance Use Topics   Alcohol use: Yes    Alcohol/week: 0.0 - 1.0 standard drinks of alcohol  Comment: socially   Drug use: No    Relationship status: married She lives with husband.   She is not employed. Regular exercise: No History of abuse: No  Family History:   Family History  Problem Relation Age of Onset   Diabetes Father        borderline   Melanoma Father        dx 90s/60s; deceased 13s   Congestive Heart Failure Father    Colon cancer Mother        dx 79s; deceased 19   Hypertension Mother    Osteoporosis Mother    Ovarian cancer Maternal Grandmother        deceased 70s   Heart disease Paternal Grandfather    Melanoma Daughter        on leg; dx 30s     Review of Systems: Review of Systems  Constitutional:  Negative for fever, malaise/fatigue and weight loss.  Respiratory:  Negative for cough, shortness of breath and wheezing.   Cardiovascular:  Negative for chest pain, palpitations and leg swelling.  Gastrointestinal:  Negative for abdominal pain and blood in stool.  Genitourinary:  Negative for dysuria.  Musculoskeletal:  Negative for myalgias.  Skin:  Negative for rash.  Neurological:  Negative for dizziness and headaches.  Endo/Heme/Allergies:  Does not bruise/bleed easily.  Psychiatric/Behavioral:  Negative for depression. The patient is not nervous/anxious.      OBJECTIVE Physical  Exam: Vitals:   05/17/23 1029  BP: 135/89  Pulse: (!) 59  Weight: 129 lb (58.5 kg)  Height: 5' 0.75" (1.543 m)    Physical Exam Constitutional:      General: She is not in acute distress. Pulmonary:     Effort: Pulmonary effort is normal.  Abdominal:     General: There is no distension.     Palpations: Abdomen is soft.     Tenderness: There is no abdominal tenderness. There is no rebound.  Musculoskeletal:        General: No swelling. Normal range of motion.  Skin:    General: Skin is warm and dry.     Findings: No rash.  Neurological:     Mental Status: She is alert and oriented to person, place, and time.  Psychiatric:        Mood and Affect: Mood normal.        Behavior: Behavior normal.      GU / Detailed Urogynecologic Evaluation:  Pelvic Exam: Normal external female genitalia; Bartholin's and Skene's glands normal in appearance; urethral meatus normal in appearance, no urethral masses or discharge.   CST: negative  s/p hysterectomy: Speculum exam reveals normal vaginal mucosa with  atrophy and normal vaginal cuff.  Adnexa no mass, fullness, tenderness.     Pelvic floor strength III/V, puborectalis III/V external anal sphincter IV/V  Pelvic floor musculature: Right levator non-tender, Right obturator non-tender, Left levator non-tender, Left obturator non-tender  POP-Q:   POP-Q  -2.5                                            Aa   -2.5                                           Ba  -7  C   2                                            Gh  5                                            Pb  7.5                                            tvl   -2.5                                            Ap  -2.5                                            Bp                                                 D      Rectal Exam:  Normal sphincter tone, no distal rectocele, enterocoele not present, no rectal masses, no  sign of dyssynergia when asking the patient to bear down.  Post-Void Residual (PVR) by Bladder Scan: In order to evaluate bladder emptying, we discussed obtaining a postvoid residual and she agreed to this procedure.  Procedure: The ultrasound unit was placed on the patient's abdomen in the suprapubic region after the patient had voided. A PVR of 35 ml was obtained by bladder scan.  Laboratory Results: POC urine: negative   ASSESSMENT AND PLAN Ms. Po is a 67 y.o. with:  1. Overactive bladder   2. Urinary frequency   3. SUI (stress urinary incontinence, female)   4. Incontinence of feces, unspecified fecal incontinence type    OAB - We discussed the symptoms of overactive bladder (OAB), which include urinary urgency, urinary frequency, nocturia, with or without urge incontinence.  While we do not know the exact etiology of OAB, several treatment options exist. We discussed management including behavioral therapy (decreasing bladder irritants, urge suppression strategies, timed voids, bladder retraining), physical therapy, medication; for refractory cases posterior tibial nerve stimulation, sacral neuromodulation, and intravesical botulinum toxin injection.  - prescribed myrbetriq 25mg  daily.  For Beta-3 agonist medication, we discussed the potential side effect of elevated blood pressure which is more likely to occur in individuals with uncontrolled hypertension.  2. SUI - she does not feel this happens often enough for treatment  3. Accidental Bowel Leakage:  - Treatment options include anti-diarrhea medication (loperamide/ Imodium OTC or prescription lomotil), fiber supplements, physical therapy, and possible sacral neuromodulation or surgery.   - She has an upcoming colonoscopy to assess for colitis. If negative, then will consider SNM, handout provided.   Return 6 weeks  Marguerita Beards, MD

## 2023-05-19 ENCOUNTER — Other Ambulatory Visit: Payer: Self-pay | Admitting: Nurse Practitioner

## 2023-05-19 DIAGNOSIS — D0511 Intraductal carcinoma in situ of right breast: Secondary | ICD-10-CM

## 2023-05-19 NOTE — Progress Notes (Unsigned)
Patient Care Team: [name], NP as PCP - General (Internal Medicine)   CHIEF COMPLAINT: Follow up R breast DCIS  Oncology History Overview Note  Cancer Staging Ductal carcinoma in situ (DCIS) of right breast Staging form: Breast, AJCC 8th Edition - Clinical stage from 11/19/2017: Stage 0 (cTis (DCIS), cN0, cM0, ER: Positive, PR: Positive, HER2: Not assessed ) - Signed by [name], MD on 11/26/2017     Ductal carcinoma in situ (DCIS) of right breast  11/07/2017 Mammogram    Diagnostic Mammogram Right breast 11/07/17 IMPRESSION:  The results of the exam were reviewed with the patient.  The 3 cm are of grouped branching linear calcifications in the right breast lower inner quadrant middle depth are at moderate concern but not classic for malignancy.  A Stereotactic biopsy is recommended.   11/19/2017 Initial Biopsy   Diagnosis 11/19/17 Breast, right, needle core biopsy - DUCTAL CARCINOMA IN SITU WITH CALCIFICATIONS. - SEE COMMENT.    11/19/2017 Receptors her2   Estrogen Receptor: 90%, POSITIVE, STRONG STAINING INTENSITY Progesterone Receptor: 25%, POSITIVE, STRONG STAINING INTENSITY    11/19/2017 Initial Diagnosis   Ductal carcinoma in situ (DCIS) of right breast   12/23/2017 Genetic Testing   Testing did not reveal a pathogenic mutation in any of the genes analyzed. A copy of the genetic test report will be scanned into Epic under the Media tab.   The genes analyzed were the 83 genes on [facility]'s Multi-Cancer panel (ALK, APC, ATM, AXIN2, BAP1, BARD1, BLM, BMPR1A, BRCA1, BRCA2, BRIP1, CASR, CDC73, CDH1, CDK4, CDKN1B, CDKN1C, CDKN2A, CEBPA, CHEK2, CTNNA1, DICER1, DIS3L2, EGFR, EPCAM, FH, FLCN, GATA2, GPC3, GREM1, HOXB13, HRAS, KIT, MAX, MEN1, MET, MITF, MLH1, MSH2, MSH3, MSH6, MUTYH, NBN, NF1, NF2, NTHL1, PALB2, PDGFRA, PHOX2B, PMS2, POLD1, POLE, POT1, PRKAR1A, PTCH1, PTEN, RAD50, RAD51C, RAD51D, RB1, RECQL4, RET, RUNX1, SDHA, SDHAF2, SDHB, SDHC, SDHD, SMAD4, SMARCA4,  SMARCB1, SMARCE1, STK11, SUFU, TERC, TERT, TMEM127, TP53, TSC1, TSC2, VHL, WRN, WT1).   12/25/2017 Pathology Results   Diagnosis, 12/25/2017 Breast, lumpectomy, Right w/seed - DUCTAL CARCINOMA IN SITU WITH CALCIFICATIONS, LOW GRADE, SPANNING 1.3 CM. - THE SURGICAL RESECTION MARGINS ARE NEGATIVE FOR CARCINOMA   01/20/2018 - 02/14/2018 Radiation Therapy   Radiation treatment dates:   01/20/2018 - 02/14/2018   Site/dose:   The patient initially received a dose of 42.5 Gy in 17 fractions to the right breast using whole-breast tangent fields. This was delivered using a 3-D conformal technique. The patient then received a boost to the seroma. This delivered an additional 7.5 Gy in 3 fractions using a 3 field photon technique due to the depth of the seroma. The total dose was 50 Gy.   Narrative: The patient tolerated radiation treatment relatively well.   The patient had some expected skin irritation as she progressed during treatment. Moist desquamation was not present at the end of treatment.    02/2018 -  Anti-estrogen oral therapy   Raloxifene 60mg  once daily starting in 02/2018      CURRENT THERAPY: Raloxifene starting 02/2018, held for 2 months in 2021 due to hot flashes; plan to complete summer 2024   INTERVAL HISTORY [name] returns for follow up as scheduled, last seen by me 05/23/22. Mammo 12/20/22 was benign, DEXA was normal.   ROS   Past Medical History:  Diagnosis Date   Arthritis    Cancer (HCC) 04/2010   melanoma-left knee, right arm   Chronic back pain    Complication of anesthesia    Ductal  carcinoma in situ (DCIS) of right breast 10/2017   Family history of adverse reaction to anesthesia    mother has PONV   Family history of colon cancer    Family history of melanoma    Family history of ovarian cancer    Genetic testing 12/23/2017   STAT Breast panel with reflex to Multi-Cancer panel (83 genes) @ [facility] - No pathogenic mutations detected   GERD (gastroesophageal reflux  disease)    H/O hiatal hernia    Heart murmur    no problems   History of hand surgery    Hyperlipidemia    Hypertension    Left rotator cuff tear    Malignant melanoma of lower leg (HCC) 03/10/2015   Melanoma of right upper arm (HCC) 10/12/2013   PONV (postoperative nausea and vomiting)    "zofran works great   Seasonal allergies    Vertigo      Past Surgical History:  Procedure Laterality Date   ANTERIOR LAT LUMBAR FUSION Right 04/17/2013   Procedure: ANTERIOR LATERAL LUMBAR FUSION 3 LEVELS;  Surgeon: Maeola Harman, MD;  Location: [facility];  Service: Neurosurgery;  Laterality: Right;  Right Lumbar Two-Three Lumbar Three-Four Lumbar Four-Five  Anterolateral Fusion   BACK SURGERY     BREAST LUMPECTOMY WITH RADIOACTIVE SEED LOCALIZATION Right 12/25/2017   Procedure: RIGHT BREAST LUMPECTOMY WITH RADIOACTIVE SEED LOCALIZATION;  Surgeon: Claud Kelp, MD;  Location: [facility];  Service: General;  Laterality: Right;   BREAST SURGERY     CESAREAN SECTION  1978   CESAREAN SECTION  1980   COLONOSCOPY     COLONOSCOPY WITH PROPOFOL N/A 06/23/2018   Procedure: COLONOSCOPY WITH PROPOFOL;  Surgeon: Vida Rigger, MD;  Location: [facility];  Service: Endoscopy;  Laterality: N/A;   CYSTOSCOPY N/A 06/26/2016   Procedure: CYSTOSCOPY;  Surgeon: Jerene Bears, MD;  Location: [facility];  Service: Gynecology;  Laterality: N/A;   DILATATION & CURETTAGE/HYSTEROSCOPY WITH MYOSURE N/A 01/24/2016   Procedure: DILATATION & CURETTAGE/HYSTEROSCOPY WITH MYOSURE;  Surgeon: Jerene Bears, MD;  Location: [facility];  Service: Gynecology;  Laterality: N/A;   FOOT SURGERY Right    early 20s-bone spur removal   HAND SURGERY Bilateral    for basal joint arthritis   HERNIA REPAIR     LAPAROSCOPIC HYSTERECTOMY Bilateral 06/26/2016   Procedure: HYSTERECTOMY TOTAL LAPAROSCOPIC;  Surgeon: Jerene Bears, MD;  Location: [facility];  Service: Gynecology;  Laterality: Bilateral;   LUMBAR PERCUTANEOUS PEDICLE  SCREW 3 LEVEL N/A 04/17/2013   Procedure: LUMBAR PERCUTANEOUS PEDICLE SCREW 3 LEVEL;  Surgeon: Maeola Harman, MD;  Location: [facility];  Service: Neurosurgery;  Laterality: N/A;  Decompression and Posterior Lumbar Interbody Fusion of Lumbar Five-Sacral One, Percutaneous Screws Lumbar Two through Sacral One   MELANOMA EXCISION     knee 08 lft. elbow 11rt   SALPINGOOPHORECTOMY Bilateral 06/26/2016   Procedure: SALPINGO OOPHORECTOMY;  Surgeon: Jerene Bears, MD;  Location: [facility];  Service: Gynecology;  Laterality: Bilateral;   SHOULDER SURGERY Right 04/28/2015   SHOULDER SURGERY Left 2023   STRABISMUS SURGERY Bilateral 07/29/2020   Procedure: BILATERAL STRABISMUS REPAIR;  Surgeon: Verne Carrow, MD;  Location: [facility];  Service: Ophthalmology;  Laterality: Bilateral;   TUBAL LIGATION  1986   WLE  06/17/2007   and left groin lymph node   XI ROBOTIC ASSISTED HIATAL HERNIA REPAIR N/A 04/04/2021   Procedure: ROBOTIC HIATAL HERNIA REPAIR WITH FUNDOPLICATION;  Surgeon: Axel Filler, MD;  Location: [facility];  Service: General;  Laterality: N/A;     Outpatient Encounter Medications as of 05/20/2023  Medication Sig   Ascorbic Acid (VITAMIN C WITH ROSE HIPS) 1000 MG tablet Take 1,000 mg by mouth daily.   atorvastatin (LIPITOR) 10 MG tablet TAKE 1 TABLET BY MOUTH EVERY DAY   cetirizine (ZYRTEC) 10 MG tablet Take 10 mg by mouth daily.   diclofenac (CATAFLAM) 50 MG tablet Take 50 mg by mouth 3 (three) times daily.   losartan-hydrochlorothiazide (HYZAAR) 100-12.5 MG tablet Take 1 tablet by mouth daily. Reported on 01/23/2016   meclizine (ANTIVERT) 25 MG tablet Take 1 tablet (25 mg total) by mouth 3 (three) times daily as needed (vertigo). Reported on 01/23/2016   mirabegron ER (MYRBETRIQ) 25 MG TB24 tablet Take 1 tablet (25 mg total) by mouth daily.   Multiple Vitamins-Minerals (ALIVE WOMENS 50+) TABS Take 1 tablet by mouth daily.   pregabalin (LYRICA) 50 MG capsule Take 50 mg by mouth 3  (three) times daily.   psyllium (REGULOID) 0.52 g capsule Take 0.52 g by mouth daily.   raloxifene (EVISTA) 60 MG tablet Take 1 tablet (60 mg total) by mouth daily.   Turmeric 500 MG CAPS Take by mouth.   No facility-administered encounter medications on file as of 05/20/2023.     There were no vitals filed for this visit. There is no height or weight on file to calculate BMI.   PHYSICAL EXAM GENERAL:alert, no distress and comfortable SKIN: no rash  EYES: sclera clear NECK: without mass LYMPH:  no palpable cervical or supraclavicular lymphadenopathy  LUNGS: clear with normal breathing effort HEART: regular rate & rhythm, no lower extremity edema ABDOMEN: abdomen soft, non-tender and normal bowel sounds NEURO: alert & oriented x 3 with fluent speech, no focal motor/sensory deficits Breast exam:  PAC without erythema    CBC    Component Value Date/Time   WBC 5.1 11/23/2022 1131   RBC 4.03 11/23/2022 1131   HGB 13.1 11/23/2022 1131   HGB 13.2 05/23/2022 1003   HGB 12.8 05/07/2016 1455   HCT 37.4 11/23/2022 1131   PLT 289.0 11/23/2022 1131   PLT 237 05/23/2022 1003   MCV 92.7 11/23/2022 1131   MCH 32.0 05/23/2022 1003   MCHC 35.0 11/23/2022 1131   RDW 13.1 11/23/2022 1131   LYMPHSABS 1.5 11/23/2022 1131   MONOABS 0.4 11/23/2022 1131   EOSABS 0.1 11/23/2022 1131   BASOSABS 0.1 11/23/2022 1131     CMP     Component Value Date/Time   NA 143 11/23/2022 1131   K 3.3 (L) 11/23/2022 1131   CL 102 11/23/2022 1131   CO2 33 (H) 11/23/2022 1131   GLUCOSE 89 11/23/2022 1131   BUN 18 11/23/2022 1131   CREATININE 0.80 11/23/2022 1131   CREATININE 0.74 05/23/2022 1003   CREATININE 0.91 10/25/2014 1708   CALCIUM 10.1 11/23/2022 1131   PROT 6.2 11/23/2022 1131   ALBUMIN 3.8 11/23/2022 1131   AST 21 11/23/2022 1131   AST 32 05/23/2022 1003   ALT 22 11/23/2022 1131   ALT 29 05/23/2022 1003   ALKPHOS 47 11/23/2022 1131   BILITOT 0.6 11/23/2022 1131   BILITOT 0.5 05/23/2022  1003   GFRNONAA >60 05/23/2022 1003   GFRAA >60 05/16/2020 1032     ASSESSMENT & PLAN:Melissa Mcclure is a 67 y.o. female with    1. Ductal carcinoma in situ (DCIS) of right breast, ER/PR Positive, Grade 1 -Diagnosed in 10/2017. S/p right breast lumpectomy and adjuvant radiation.  -  She started anti-estrogen therapy with Raloxifene in 02/2018. She held it for 2 months in 2021 due to hot flashes but restarted due to no improvement. Overall tolerable.  -Most recent mammogram 12/20/22 was benign.    2. Hot Flashes  -She knows to avoid hormonal replacement. Gabapentin and Effexor did not work well enough for her.  -holding Raloxifene for 2 months in 2021 did not help.  -Improved with acupuncture, once a month -Stable and tolerable   3. Genetic Counseling was negative for pathogenic mutation.    4. HTN, history of skin melanoma, rash, arthritis  -Follow-up with PCP -She has chronic diffuse skin rash of her LE. She will continue f/u with her Dermatologist.  -OA Mainly in hands, back and neck  -I encouraged her to continue healthy active lifestyle and age-appropriate health maintenance   5. Bone health  -Her 10/2017 DEXA at [facility] was normal with lowest T-Score of -0.4 at left femur neck. Her 12/10/19 was stable (T-score -0.5).  -She is on Raloxifene which strengthens her bone. -Repeat DEXA 11/2022 remains normal     PLAN:  No orders of the defined types were placed in this encounter.     All questions were answered. The patient knows to call the clinic with any problems, questions or concerns. No barriers to learning were detected. I spent *** counseling the patient face to face. The total time spent in the appointment was *** and more than 50% was on counseling, review of test results, and coordination of care.   Santiago Glad, NP-C @DATE @

## 2023-05-20 ENCOUNTER — Inpatient Hospital Stay: Payer: BC Managed Care – PPO | Attending: Nurse Practitioner

## 2023-05-20 ENCOUNTER — Inpatient Hospital Stay: Payer: BC Managed Care – PPO | Admitting: Nurse Practitioner

## 2023-05-20 ENCOUNTER — Encounter: Payer: Self-pay | Admitting: Nurse Practitioner

## 2023-05-20 VITALS — BP 135/82 | HR 64 | Temp 98.3°F | Resp 16 | Ht 60.75 in | Wt 133.4 lb

## 2023-05-20 DIAGNOSIS — Z79811 Long term (current) use of aromatase inhibitors: Secondary | ICD-10-CM | POA: Insufficient documentation

## 2023-05-20 DIAGNOSIS — Z923 Personal history of irradiation: Secondary | ICD-10-CM | POA: Insufficient documentation

## 2023-05-20 DIAGNOSIS — D0511 Intraductal carcinoma in situ of right breast: Secondary | ICD-10-CM

## 2023-05-20 LAB — CMP (CANCER CENTER ONLY)
ALT: 19 U/L (ref 0–44)
AST: 25 U/L (ref 15–41)
Albumin: 4 g/dL (ref 3.5–5.0)
Alkaline Phosphatase: 60 U/L (ref 38–126)
Anion gap: 6 (ref 5–15)
BUN: 22 mg/dL (ref 8–23)
CO2: 34 mmol/L — ABNORMAL HIGH (ref 22–32)
Calcium: 9.7 mg/dL (ref 8.9–10.3)
Chloride: 101 mmol/L (ref 98–111)
Creatinine: 0.91 mg/dL (ref 0.44–1.00)
GFR, Estimated: >60 mL/min (ref >60)
Glucose, Bld: 95 mg/dL (ref 70–99)
Potassium: 3.3 mmol/L — ABNORMAL LOW (ref 3.5–5.1)
Sodium: 141 mmol/L (ref 135–145)
Total Bilirubin: 0.5 mg/dL (ref 0.3–1.2)
Total Protein: 6.3 g/dL — ABNORMAL LOW (ref 6.5–8.1)

## 2023-05-20 LAB — CBC WITH DIFFERENTIAL (CANCER CENTER ONLY)
Abs Immature Granulocytes: 0.02 10*3/uL (ref 0.00–0.07)
Basophils Absolute: 0.1 10*3/uL (ref 0.0–0.1)
Basophils Relative: 1 %
Eosinophils Absolute: 0.2 10*3/uL (ref 0.0–0.5)
Eosinophils Relative: 3 %
HCT: 34.6 % — ABNORMAL LOW (ref 36.0–46.0)
Hemoglobin: 12.4 g/dL (ref 12.0–15.0)
Immature Granulocytes: 0 %
Lymphocytes Relative: 28 %
Lymphs Abs: 2 10*3/uL (ref 0.7–4.0)
MCH: 33 pg (ref 26.0–34.0)
MCHC: 35.8 g/dL (ref 30.0–36.0)
MCV: 92 fL (ref 80.0–100.0)
Monocytes Absolute: 0.6 10*3/uL (ref 0.1–1.0)
Monocytes Relative: 8 %
Neutro Abs: 4.3 10*3/uL (ref 1.7–7.7)
Neutrophils Relative %: 60 %
Platelet Count: 210 10*3/uL (ref 150–400)
RBC: 3.76 MIL/uL — ABNORMAL LOW (ref 3.87–5.11)
RDW: 12.6 % (ref 11.5–15.5)
WBC Count: 7.1 10*3/uL (ref 4.0–10.5)
nRBC: 0 % (ref 0.0–0.2)

## 2023-05-21 ENCOUNTER — Ambulatory Visit (INDEPENDENT_AMBULATORY_CARE_PROVIDER_SITE_OTHER): Payer: BC Managed Care – PPO | Admitting: Ophthalmology

## 2023-05-21 ENCOUNTER — Encounter (INDEPENDENT_AMBULATORY_CARE_PROVIDER_SITE_OTHER): Payer: Self-pay | Admitting: Ophthalmology

## 2023-05-21 DIAGNOSIS — I10 Essential (primary) hypertension: Secondary | ICD-10-CM

## 2023-05-21 DIAGNOSIS — H25813 Combined forms of age-related cataract, bilateral: Secondary | ICD-10-CM

## 2023-05-21 DIAGNOSIS — H33103 Unspecified retinoschisis, bilateral: Secondary | ICD-10-CM | POA: Diagnosis not present

## 2023-05-21 DIAGNOSIS — H35033 Hypertensive retinopathy, bilateral: Secondary | ICD-10-CM | POA: Diagnosis not present

## 2023-05-21 DIAGNOSIS — Z9889 Other specified postprocedural states: Secondary | ICD-10-CM

## 2023-05-22 DIAGNOSIS — F411 Generalized anxiety disorder: Secondary | ICD-10-CM | POA: Diagnosis not present

## 2023-05-23 ENCOUNTER — Encounter (INDEPENDENT_AMBULATORY_CARE_PROVIDER_SITE_OTHER): Payer: Self-pay | Admitting: Ophthalmology

## 2023-05-24 ENCOUNTER — Telehealth: Payer: Self-pay

## 2023-05-24 NOTE — Telephone Encounter (Addendum)
Faxed orders over to [facility] to contact patient and have scan done as per Santiago Glad NP.   ----- Message from Pollyann Samples sent at 05/20/2023  9:57 PM EDT ----- Richarda Overlie or my nurse on Tuesday, Could you please help schedule bilateral diagnostic mammo and R axillary [place] in the next week at [facility].  Indication is h/o R DCIS with R axillary fullness and soft tissue density on the left.   Thanks [employer]

## 2023-05-27 NOTE — Progress Notes (Unsigned)
LMP 06/24/2008    Subjective:    Patient ID: Melissa Mcclure, female    DOB: [birth year], 67 y.o.   MRN: [id number]  CC: No chief complaint on file.   HPI: Melissa Mcclure is a 67 y.o. female presenting on 05/28/2023 for comprehensive medical examination. Current medical complaints include:{Blank single:19197::"none","***"}  She currently lives with: Menopausal Symptoms: {Blank single:19197::"yes","no"}  Depression and Anxiety Screen done today and results listed below:     11/20/2022    9:48 AM 05/25/2022   12:16 PM 11/22/2021   10:32 AM 09/07/2021    9:14 AM 08/22/2021   11:23 AM  Depression screen PHQ 2/9  Decreased Interest 0 0 0 0 0  Down, Depressed, Hopeless 0 0 0 0 0  PHQ - 2 Score 0 0 0 0 0       No data to display          The patient {has/does not have:19849} a history of falls. I {did/did not:19850} complete a risk assessment for falls. A plan of care for falls {was/was not:19852} documented.   Past Medical History:  Past Medical History:  Diagnosis Date   Arthritis    Cancer (HCC) 04/2010   melanoma-left knee, right arm   Chronic back pain    Complication of anesthesia    Ductal carcinoma in situ (DCIS) of right breast 10/2017   Family history of adverse reaction to anesthesia    mother has PONV   Family history of colon cancer    Family history of melanoma    Family history of ovarian cancer    Genetic testing 12/23/2017   STAT Breast panel with reflex to Multi-Cancer panel (83 genes) @ [facility] - No pathogenic mutations detected   GERD (gastroesophageal reflux disease)    H/O hiatal hernia    Heart murmur    no problems   History of hand surgery    Hyperlipidemia    Hypertension    Left rotator cuff tear    Malignant melanoma of lower leg (HCC) 03/10/2015   Melanoma of right upper arm (HCC) 10/12/2013   PONV (postoperative nausea and vomiting)    "zofran works great   Seasonal allergies    Vertigo     Surgical History:  Past Surgical History:   Procedure Laterality Date   ANTERIOR LAT LUMBAR FUSION Right 04/17/2013   Procedure: ANTERIOR LATERAL LUMBAR FUSION 3 LEVELS;  Surgeon: Maeola Harman, MD;  Location: [facility];  Service: Neurosurgery;  Laterality: Right;  Right Lumbar Two-Three Lumbar Three-Four Lumbar Four-Five  Anterolateral Fusion   BACK SURGERY     BREAST LUMPECTOMY WITH RADIOACTIVE SEED LOCALIZATION Right 12/25/2017   Procedure: RIGHT BREAST LUMPECTOMY WITH RADIOACTIVE SEED LOCALIZATION;  Surgeon: Claud Kelp, MD;  Location: [facility];  Service: General;  Laterality: Right;   BREAST SURGERY     CESAREAN SECTION  1978   CESAREAN SECTION  1980   COLONOSCOPY     COLONOSCOPY WITH PROPOFOL N/A 06/23/2018   Procedure: COLONOSCOPY WITH PROPOFOL;  Surgeon: Vida Rigger, MD;  Location: [facility];  Service: Endoscopy;  Laterality: N/A;   CYSTOSCOPY N/A 06/26/2016   Procedure: CYSTOSCOPY;  Surgeon: Jerene Bears, MD;  Location: [facility];  Service: Gynecology;  Laterality: N/A;   DILATATION & CURETTAGE/HYSTEROSCOPY WITH MYOSURE N/A 01/24/2016   Procedure: DILATATION & CURETTAGE/HYSTEROSCOPY WITH MYOSURE;  Surgeon: Jerene Bears, MD;  Location: [facility];  Service: Gynecology;  Laterality: N/A;   FOOT SURGERY Right  early 20s-bone spur removal   HAND SURGERY Bilateral    for basal joint arthritis   HERNIA REPAIR     LAPAROSCOPIC HYSTERECTOMY Bilateral 06/26/2016   Procedure: HYSTERECTOMY TOTAL LAPAROSCOPIC;  Surgeon: Jerene Bears, MD;  Location: [facility];  Service: Gynecology;  Laterality: Bilateral;   LUMBAR PERCUTANEOUS PEDICLE SCREW 3 LEVEL N/A 04/17/2013   Procedure: LUMBAR PERCUTANEOUS PEDICLE SCREW 3 LEVEL;  Surgeon: Maeola Harman, MD;  Location: [facility];  Service: Neurosurgery;  Laterality: N/A;  Decompression and Posterior Lumbar Interbody Fusion of Lumbar Five-Sacral One, Percutaneous Screws Lumbar Two through Sacral One   MELANOMA EXCISION     knee 08 lft. elbow 11rt   SALPINGOOPHORECTOMY  Bilateral 06/26/2016   Procedure: SALPINGO OOPHORECTOMY;  Surgeon: Jerene Bears, MD;  Location: [facility];  Service: Gynecology;  Laterality: Bilateral;   SHOULDER SURGERY Right 04/28/2015   SHOULDER SURGERY Left 2023   STRABISMUS SURGERY Bilateral 07/29/2020   Procedure: BILATERAL STRABISMUS REPAIR;  Surgeon: Verne Carrow, MD;  Location: [facility];  Service: Ophthalmology;  Laterality: Bilateral;   TUBAL LIGATION  1986   [facility]  06/17/2007   and left groin lymph node   XI ROBOTIC ASSISTED HIATAL HERNIA REPAIR N/A 04/04/2021   Procedure: ROBOTIC HIATAL HERNIA REPAIR WITH FUNDOPLICATION;  Surgeon: Axel Filler, MD;  Location: [facility];  Service: General;  Laterality: N/A;    Medications:  Current Outpatient Medications on File Prior to Visit  Medication Sig   Ascorbic Acid (VITAMIN C WITH ROSE HIPS) 1000 MG tablet Take 1,000 mg by mouth daily.   atorvastatin (LIPITOR) 10 MG tablet TAKE 1 TABLET BY MOUTH EVERY DAY   cetirizine (ZYRTEC) 10 MG tablet Take 10 mg by mouth daily.   diclofenac (CATAFLAM) 50 MG tablet Take 50 mg by mouth 3 (three) times daily.   losartan-hydrochlorothiazide (HYZAAR) 100-12.5 MG tablet Take 1 tablet by mouth daily. Reported on 01/23/2016   meclizine (ANTIVERT) 25 MG tablet Take 1 tablet (25 mg total) by mouth 3 (three) times daily as needed (vertigo). Reported on 01/23/2016   mirabegron ER (MYRBETRIQ) 25 MG TB24 tablet Take 1 tablet (25 mg total) by mouth daily.   Multiple Vitamins-Minerals (ALIVE WOMENS 50+) TABS Take 1 tablet by mouth daily.   pregabalin (LYRICA) 50 MG capsule Take 50 mg by mouth 3 (three) times daily.   psyllium (REGULOID) 0.52 g capsule Take 0.52 g by mouth daily.   raloxifene (EVISTA) 60 MG tablet Take 1 tablet (60 mg total) by mouth daily.   Turmeric 500 MG CAPS Take by mouth. (Patient not taking: Reported on 05/20/2023)   No current facility-administered medications on file prior to visit.    Allergies:  Allergies  Allergen  Reactions   Elemental Sulfur Hives    Social History:  Social History   Socioeconomic History   Marital status: Married    Spouse name: Not on file   Number of children: Not on file   Years of education: Not on file   Highest education level: Not on file  Occupational History   Not on file  Tobacco Use   Smoking status: Never   Smokeless tobacco: Never  Vaping Use   Vaping status: Never Used  Substance and Sexual Activity   Alcohol use: Yes    Alcohol/week: 0.0 - 1.0 standard drinks of alcohol    Comment: socially   Drug use: No   Sexual activity: Not Currently    Partners: Male    Birth control/protection: Surgical, Post-menopausal  Comment: hysterectomy  Other Topics Concern   Not on file  Social History Narrative   Not on file   Social Determinants of Health   Financial Resource Strain: Not on file  Food Insecurity: Not on file  Transportation Needs: Not on file  Physical Activity: Not on file  Stress: Not on file  Social Connections: Not on file  Intimate Partner Violence: Not on file   Social History   Tobacco Use  Smoking Status Never  Smokeless Tobacco Never   Social History   Substance and Sexual Activity  Alcohol Use Yes   Alcohol/week: 0.0 - 1.0 standard drinks of alcohol   Comment: socially    Family History:  Family History  Problem Relation Age of Onset   Diabetes Father        borderline   Melanoma Father        dx 48s/60s; deceased 61s   Congestive Heart Failure Father    Colon cancer Mother        dx 101s; deceased 85   Hypertension Mother    Osteoporosis Mother    Ovarian cancer Maternal Grandmother        deceased 37s   Heart disease Paternal Grandfather    Melanoma Daughter        on leg; dx 30s    Past medical history, surgical history, medications, allergies, family history and social history reviewed with patient today and changes made to appropriate areas of the chart.   ROS All other ROS negative except what is  listed above and in the HPI.      Objective:    LMP 06/24/2008   Wt Readings from Last 3 Encounters:  05/20/23 133 lb 6.4 oz (60.5 kg)  05/17/23 129 lb (58.5 kg)  04/29/23 136 lb (61.7 kg)    Physical Exam  Results for orders placed or performed in visit on 05/20/23  CMP (Cancer Center only)  Result Value Ref Range   Sodium 141 135 - 145 mmol/L   Potassium 3.3 (L) 3.5 - 5.1 mmol/L   Chloride 101 98 - 111 mmol/L   CO2 34 (H) 22 - 32 mmol/L   Glucose, Bld 95 70 - 99 mg/dL   BUN 22 8 - 23 mg/dL   Creatinine 1.91 4.78 - 1.00 mg/dL   Calcium 9.7 8.9 - 29.5 mg/dL   Total Protein 6.3 (L) 6.5 - 8.1 g/dL   Albumin 4.0 3.5 - 5.0 g/dL   AST 25 15 - 41 U/L   ALT 19 0 - 44 U/L   Alkaline Phosphatase 60 38 - 126 U/L   Total Bilirubin 0.5 0.3 - 1.2 mg/dL   GFR, Estimated >62 >13 mL/min   Anion gap 6 5 - 15  CBC with Differential (Cancer Center Only)  Result Value Ref Range   WBC Count 7.1 4.0 - 10.5 K/uL   RBC 3.76 (L) 3.87 - 5.11 MIL/uL   Hemoglobin 12.4 12.0 - 15.0 g/dL   HCT 08.6 (L) 57.8 - 46.9 %   MCV 92.0 80.0 - 100.0 fL   MCH 33.0 26.0 - 34.0 pg   MCHC 35.8 30.0 - 36.0 g/dL   RDW 62.9 52.8 - 41.3 %   Platelet Count 210 150 - 400 K/uL   nRBC 0.0 0.0 - 0.2 %   Neutrophils Relative % 60 %   Neutro Abs 4.3 1.7 - 7.7 K/uL   Lymphocytes Relative 28 %   Lymphs Abs 2.0 0.7 - 4.0 K/uL   Monocytes Relative  8 %   Monocytes Absolute 0.6 0.1 - 1.0 K/uL   Eosinophils Relative 3 %   Eosinophils Absolute 0.2 0.0 - 0.5 K/uL   Basophils Relative 1 %   Basophils Absolute 0.1 0.0 - 0.1 K/uL   Immature Granulocytes 0 %   Abs Immature Granulocytes 0.02 0.00 - 0.07 K/uL      Assessment & Plan:   Problem List Items Addressed This Visit   None    Follow up plan: No follow-ups on file.   LABORATORY TESTING:  - Pap smear: not applicable  IMMUNIZATIONS:   - Tdap: Tetanus vaccination status reviewed: last tetanus booster within 10 years. - Influenza: {Blank single:19197::"Up to  date","Administered today","Postponed to flu season","Refused","Given elsewhere"} - Pneumovax: Up to date - Prevnar: Up to date - HPV: Not applicable - Shingrix vaccine: {Blank single:19197::"Up to date","Administered today","Not applicable","Refused","Given elsewhere"}  SCREENING: -Mammogram: Up to date  - Colonoscopy: Up to date  - Bone Density: Up to date   PATIENT COUNSELING:   Advised to take 1 mg of folate supplement per day if capable of pregnancy.   Sexuality: Discussed sexually transmitted diseases, partner selection, use of condoms, avoidance of unintended pregnancy  and contraceptive alternatives.   Advised to avoid cigarette smoking.  I discussed with the patient that most people either abstain from alcohol or drink within safe limits (<=14/week and <=4 drinks/occasion for males, <=7/weeks and <= 3 drinks/occasion for females) and that the risk for alcohol disorders and other health effects rises proportionally with the number of drinks per week and how often a drinker exceeds daily limits.  Discussed cessation/primary prevention of drug use and availability of treatment for abuse.   Diet: Encouraged to adjust caloric intake to maintain  or achieve ideal body weight, to reduce intake of dietary saturated fat and total fat, to limit sodium intake by avoiding high sodium foods and not adding table salt, and to maintain adequate dietary potassium and calcium preferably from fresh fruits, vegetables, and low-fat dairy products.    stressed the importance of regular exercise  Injury prevention: Discussed safety belts, safety helmets, smoke detector, smoking near bedding or upholstery.   Dental health: Discussed importance of regular tooth brushing, flossing, and dental visits.    NEXT PREVENTATIVE PHYSICAL DUE IN 1 YEAR. No follow-ups on file.  Donique Hammonds A Daric Koren

## 2023-05-28 ENCOUNTER — Other Ambulatory Visit: Payer: Self-pay

## 2023-05-28 ENCOUNTER — Ambulatory Visit: Payer: BC Managed Care – PPO | Admitting: Nurse Practitioner

## 2023-05-28 ENCOUNTER — Encounter: Payer: Self-pay | Admitting: Nurse Practitioner

## 2023-05-28 ENCOUNTER — Telehealth: Payer: Self-pay

## 2023-05-28 VITALS — BP 114/78 | HR 59 | Temp 97.0°F | Ht 60.75 in | Wt 134.2 lb

## 2023-05-28 DIAGNOSIS — I10 Essential (primary) hypertension: Secondary | ICD-10-CM | POA: Diagnosis not present

## 2023-05-28 DIAGNOSIS — E876 Hypokalemia: Secondary | ICD-10-CM | POA: Diagnosis not present

## 2023-05-28 DIAGNOSIS — N951 Menopausal and female climacteric states: Secondary | ICD-10-CM | POA: Insufficient documentation

## 2023-05-28 DIAGNOSIS — Z0000 Encounter for general adult medical examination without abnormal findings: Secondary | ICD-10-CM | POA: Insufficient documentation

## 2023-05-28 DIAGNOSIS — E7800 Pure hypercholesterolemia, unspecified: Secondary | ICD-10-CM | POA: Diagnosis not present

## 2023-05-28 DIAGNOSIS — D0511 Intraductal carcinoma in situ of right breast: Secondary | ICD-10-CM

## 2023-05-28 DIAGNOSIS — K219 Gastro-esophageal reflux disease without esophagitis: Secondary | ICD-10-CM

## 2023-05-28 DIAGNOSIS — M545 Low back pain, unspecified: Secondary | ICD-10-CM

## 2023-05-28 DIAGNOSIS — G8929 Other chronic pain: Secondary | ICD-10-CM

## 2023-05-28 DIAGNOSIS — N3281 Overactive bladder: Secondary | ICD-10-CM

## 2023-05-28 LAB — LIPID PANEL
Cholesterol: 159 mg/dL (ref 0–200)
HDL: 66.9 mg/dL
LDL Cholesterol: 82 mg/dL (ref 0–99)
NonHDL: 92.44
Total CHOL/HDL Ratio: 2
Triglycerides: 50 mg/dL (ref 0.0–149.0)
VLDL: 10 mg/dL (ref 0.0–40.0)

## 2023-05-28 LAB — BASIC METABOLIC PANEL WITH GFR
BUN: 20 mg/dL (ref 6–23)
CO2: 28 meq/L (ref 19–32)
Calcium: 9.5 mg/dL (ref 8.4–10.5)
Chloride: 105 meq/L (ref 96–112)
Creatinine, Ser: 0.8 mg/dL (ref 0.40–1.20)
GFR: 75.66 mL/min
Glucose, Bld: 96 mg/dL (ref 70–99)
Potassium: 3.6 meq/L (ref 3.5–5.1)
Sodium: 143 meq/L (ref 135–145)

## 2023-05-28 MED ORDER — POTASSIUM CHLORIDE CRYS ER 10 MEQ PO TBCR: 10.0000 meq | EXTENDED_RELEASE_TABLET | Freq: Every day | ORAL | 3 refills | Status: DC

## 2023-05-28 MED ORDER — CLONIDINE 0.1 MG/24HR TD PTWK: 0.1000 mg | MEDICATED_PATCH | TRANSDERMAL | 1 refills | Status: DC

## 2023-05-28 NOTE — Assessment & Plan Note (Signed)
Continue atorvastatin 10 mg daily.  We will check lipid panel and adjust regimen based on results.

## 2023-05-28 NOTE — Telephone Encounter (Signed)
Pt called wanting to know if her Provitalize has estrogen because she knows she's not supposed to have additional estrogen.  Also, pt wants to know if the Provitalize interacts with her Raloxifene.  Informed pt that this nurse will check w/pharmacist and will f/u with pt.  Pt will await a return call.

## 2023-05-28 NOTE — Assessment & Plan Note (Signed)
Health maintenance reviewed and updated. Discussed nutrition, exercise. Check BP today. Recent CBC reviewed. Follow-up 1 year.

## 2023-05-28 NOTE — Assessment & Plan Note (Signed)
Chronic, stable.  Continue Myrbetriq 25 mg daily.

## 2023-05-28 NOTE — Assessment & Plan Note (Signed)
Potassium has been low the past few checks.  Will have her start potassium 10 mEq daily.  Follow-up in 4 weeks.

## 2023-05-28 NOTE — Patient Instructions (Signed)
It was great to see you!  We are checking your labs today and will let you know the results via mychart/phone.   Start clonidine patch once a week for the hot flashes   Start potassium 1 tablet daily.   Let's follow-up in 4 weeks, sooner if you have concerns.  If a referral was placed today, you will be contacted for an appointment. Please note that routine referrals can sometimes take up to 3-4 weeks to process. Please call our office if you haven't heard anything after this time frame.  Take care,  Rodman Pickle, NP

## 2023-05-28 NOTE — Assessment & Plan Note (Signed)
Chronic, stable.  Continue losartan-hydrochlorothiazide 100-12.5 mg daily.  Check CMP, CBC, lipid panel today.  Follow-up 6 months.

## 2023-05-28 NOTE — Progress Notes (Unsigned)
   05/28/2023  Patient ID: Ledora Bottcher, female   DOB: 10-Oct-1955, 67 y.o.   MRN: [id number]  I found a lot of conflicting information, even from the manufacturer!  It appears that the product does contain phytoestrogens (plant derived substances that mimic estrogen), which most reputable sources I referred to show can actually decrease occurrence and recurrence of breast cancer.  ----- Message -----  From: [name], NP  Sent: 05/28/2023  11:09 AM EDT  To: Lenna Gilford, RPH  Subject: question                                       Does provitalize supplement have estrogen in it or increase estrogen levels? From what I saw online, it looks like it can increase estrogen levels. She has a history of breast cancer.

## 2023-05-28 NOTE — Assessment & Plan Note (Signed)
Chronic, stable.  She is currently taking Lyrica 100 mg at bedtime.  She states that this has really helped with the back pain.  Continue taking this medication and following with pain management.

## 2023-05-28 NOTE — Assessment & Plan Note (Signed)
She is following with oncology and doing well. She has a l a few months left of taking evista. Last mammogram normal.

## 2023-05-28 NOTE — Assessment & Plan Note (Signed)
She has been having hot flashes and started a supplement, provitalize, for hot flashes.  After looking online, it appears that this is a phytoestrogen supplement, and can increase estrogen levels.  I recommend that she stop taking this, however will reach out to our pharmacist as well.  Start clonidine 0.1 mg patch weekly for hot flashes.  Follow-up in 4 weeks.

## 2023-05-29 DIAGNOSIS — N6489 Other specified disorders of breast: Secondary | ICD-10-CM | POA: Diagnosis not present

## 2023-05-29 DIAGNOSIS — Z853 Personal history of malignant neoplasm of breast: Secondary | ICD-10-CM | POA: Diagnosis not present

## 2023-06-04 DIAGNOSIS — L853 Xerosis cutis: Secondary | ICD-10-CM | POA: Diagnosis not present

## 2023-06-04 DIAGNOSIS — L814 Other melanin hyperpigmentation: Secondary | ICD-10-CM | POA: Diagnosis not present

## 2023-06-04 DIAGNOSIS — D225 Melanocytic nevi of trunk: Secondary | ICD-10-CM | POA: Diagnosis not present

## 2023-06-04 DIAGNOSIS — D2262 Melanocytic nevi of left upper limb, including shoulder: Secondary | ICD-10-CM | POA: Diagnosis not present

## 2023-06-04 DIAGNOSIS — L570 Actinic keratosis: Secondary | ICD-10-CM | POA: Diagnosis not present

## 2023-06-05 DIAGNOSIS — F411 Generalized anxiety disorder: Secondary | ICD-10-CM | POA: Diagnosis not present

## 2023-06-06 ENCOUNTER — Encounter: Payer: Self-pay | Admitting: Nurse Practitioner

## 2023-06-18 ENCOUNTER — Other Ambulatory Visit: Payer: Self-pay | Admitting: Nurse Practitioner

## 2023-06-19 ENCOUNTER — Encounter: Payer: Self-pay | Admitting: Gastroenterology

## 2023-06-19 ENCOUNTER — Ambulatory Visit: Payer: BC Managed Care – PPO | Admitting: Gastroenterology

## 2023-06-19 VITALS — BP 97/64 | HR 62 | Temp 98.1°F | Resp 12 | Ht 62.0 in | Wt 136.0 lb

## 2023-06-19 DIAGNOSIS — R1310 Dysphagia, unspecified: Secondary | ICD-10-CM

## 2023-06-19 DIAGNOSIS — Z800 Family history of malignant neoplasm of digestive organs: Secondary | ICD-10-CM

## 2023-06-19 DIAGNOSIS — R197 Diarrhea, unspecified: Secondary | ICD-10-CM | POA: Diagnosis not present

## 2023-06-19 DIAGNOSIS — Z1211 Encounter for screening for malignant neoplasm of colon: Secondary | ICD-10-CM

## 2023-06-19 MED ORDER — SODIUM CHLORIDE 0.9 % IV SOLN: 500.0000 mL | INTRAVENOUS | Status: DC

## 2023-06-19 NOTE — Progress Notes (Signed)
Pt's states no medical or surgical changes since previsit or office visit. 

## 2023-06-19 NOTE — Progress Notes (Signed)
GASTROENTEROLOGY PROCEDURE H&P NOTE   Primary Care Physician: [name], NP    Reason for Procedure:  Dysphagia, diarrhea, GERD, history of colon cancer  Plan:    EGD, colonoscopy  Patient is appropriate for endoscopic procedure(s) in the ambulatory ([facility]) setting.  The nature of the procedure, as well as the risks, benefits, and alternatives were carefully and thoroughly reviewed with the patient. Ample time for discussion and questions allowed. The patient understood, was satisfied, and agreed to proceed.     HPI: Melissa Mcclure is a 67 y.o. female who presents for EGD for evaluation of intermittent solid food dysphagia with colonoscopy for colon cancer screening, history of colon cancer, and evaluation of diarrhea.   Endoscopic History: - 08/2014: Colonoscopy (Dr. Ewing Schlein): Internal/external hemorrhoids, sigmoid diverticulosis, large amount of stool with incomplete clearance and poor visualization.  Repeat in 3 years - 05/2018: Colonoscopy (Dr. Ewing Schlein): Internal/external hemorrhoids, sigmoid diverticulosis, otherwise normal.  Repeat 5 years due to family history.  Past Medical History:  Diagnosis Date   Arthritis    Cancer (HCC) 04/2010   melanoma-left knee, right arm   Chronic back pain    Complication of anesthesia    Ductal carcinoma in situ (DCIS) of right breast 10/2017   Family history of adverse reaction to anesthesia    mother has PONV   Family history of colon cancer    Family history of melanoma    Family history of ovarian cancer    Genetic testing 12/23/2017   STAT Breast panel with reflex to Multi-Cancer panel (83 genes) @ [facility] - No pathogenic mutations detected   GERD (gastroesophageal reflux disease)    H/O hiatal hernia    Heart murmur    no problems   History of hand surgery    Hyperlipidemia    Hypertension    Left rotator cuff tear    Malignant melanoma of lower leg (HCC) 03/10/2015   Melanoma of right upper arm (HCC) 10/12/2013   PONV  (postoperative nausea and vomiting)    "zofran works great   Seasonal allergies    Vertigo     Past Surgical History:  Procedure Laterality Date   ANTERIOR LAT LUMBAR FUSION Right 04/17/2013   Procedure: ANTERIOR LATERAL LUMBAR FUSION 3 LEVELS;  Surgeon: Maeola Harman, MD;  Location: [facility];  Service: Neurosurgery;  Laterality: Right;  Right Lumbar Two-Three Lumbar Three-Four Lumbar Four-Five  Anterolateral Fusion   BACK SURGERY     BREAST LUMPECTOMY WITH RADIOACTIVE SEED LOCALIZATION Right 12/25/2017   Procedure: RIGHT BREAST LUMPECTOMY WITH RADIOACTIVE SEED LOCALIZATION;  Surgeon: Claud Kelp, MD;  Location: [facility];  Service: General;  Laterality: Right;   BREAST SURGERY     CESAREAN SECTION  1978   CESAREAN SECTION  1980   COLONOSCOPY     COLONOSCOPY WITH PROPOFOL N/A 06/23/2018   Procedure: COLONOSCOPY WITH PROPOFOL;  Surgeon: Vida Rigger, MD;  Location: [facility];  Service: Endoscopy;  Laterality: N/A;   CYSTOSCOPY N/A 06/26/2016   Procedure: CYSTOSCOPY;  Surgeon: Jerene Bears, MD;  Location: [facility];  Service: Gynecology;  Laterality: N/A;   DILATATION & CURETTAGE/HYSTEROSCOPY WITH MYOSURE N/A 01/24/2016   Procedure: DILATATION & CURETTAGE/HYSTEROSCOPY WITH MYOSURE;  Surgeon: Jerene Bears, MD;  Location: [facility];  Service: Gynecology;  Laterality: N/A;   FOOT SURGERY Right    early 20s-bone spur removal   HAND SURGERY Bilateral    for basal joint arthritis   HERNIA REPAIR  LAPAROSCOPIC HYSTERECTOMY Bilateral 06/26/2016   Procedure: HYSTERECTOMY TOTAL LAPAROSCOPIC;  Surgeon: Jerene Bears, MD;  Location: [facility];  Service: Gynecology;  Laterality: Bilateral;   LUMBAR PERCUTANEOUS PEDICLE SCREW 3 LEVEL N/A 04/17/2013   Procedure: LUMBAR PERCUTANEOUS PEDICLE SCREW 3 LEVEL;  Surgeon: Maeola Harman, MD;  Location: [facility];  Service: Neurosurgery;  Laterality: N/A;  Decompression and Posterior Lumbar Interbody Fusion of Lumbar Five-Sacral One,  Percutaneous Screws Lumbar Two through Sacral One   MELANOMA EXCISION     knee 08 lft. elbow 11rt   SALPINGOOPHORECTOMY Bilateral 06/26/2016   Procedure: SALPINGO OOPHORECTOMY;  Surgeon: Jerene Bears, MD;  Location: [facility];  Service: Gynecology;  Laterality: Bilateral;   SHOULDER SURGERY Right 04/28/2015   SHOULDER SURGERY Left 2023   STRABISMUS SURGERY Bilateral 07/29/2020   Procedure: BILATERAL STRABISMUS REPAIR;  Surgeon: Verne Carrow, MD;  Location: [facility];  Service: Ophthalmology;  Laterality: Bilateral;   TUBAL LIGATION  1986   [facility]  06/17/2007   and left groin lymph node   XI ROBOTIC ASSISTED HIATAL HERNIA REPAIR N/A 04/04/2021   Procedure: ROBOTIC HIATAL HERNIA REPAIR WITH FUNDOPLICATION;  Surgeon: Axel Filler, MD;  Location: [facility];  Service: General;  Laterality: N/A;    Prior to Admission medications   Medication Sig Start Date End Date Taking? Authorizing Provider  Ascorbic Acid (VITAMIN C WITH ROSE HIPS) 1000 MG tablet Take 1,000 mg by mouth daily.   Yes [provider]  atorvastatin (LIPITOR) 10 MG tablet TAKE 1 TABLET BY MOUTH EVERY DAY 07/23/22  Yes Mliss Sax, MD  cetirizine (ZYRTEC) 10 MG tablet Take 10 mg by mouth daily.   Yes [provider]  losartan-hydrochlorothiazide (HYZAAR) 100-12.5 MG tablet Take 1 tablet by mouth daily. Reported on 01/23/2016 11/23/22  Yes McElwee, Lauren A, NP  meclizine (ANTIVERT) 25 MG tablet Take 1 tablet (25 mg total) by mouth 3 (three) times daily as needed (vertigo). Reported on 01/23/2016 11/23/22  Yes McElwee, Lauren A, NP  mirabegron ER (MYRBETRIQ) 25 MG TB24 tablet Take 1 tablet (25 mg total) by mouth daily. 05/17/23  Yes Marguerita Beards, MD  Multiple Vitamins-Minerals (ALIVE WOMENS 50+) TABS Take 1 tablet by mouth daily.   Yes [provider]  potassium chloride (KLOR-CON M) 10 MEQ tablet Take 1 tablet (10 mEq total) by mouth daily. 05/28/23  Yes McElwee, Lauren A, NP   pregabalin (LYRICA) 50 MG capsule Take 50 mg by mouth 3 (three) times daily.   Yes [provider]  psyllium (REGULOID) 0.52 g capsule Take 0.52 g by mouth daily.   Yes [provider]  UNABLE TO FIND Med Name: Provaitalize  2 Capsules each morning   Yes [provider]  cloNIDine (CATAPRES - DOSED IN MG/24 HR) 0.1 mg/24hr patch Place 1 patch (0.1 mg total) onto the skin once a week. Patient not taking: Reported on 06/19/2023 05/28/23   [name], NP  diclofenac (CATAFLAM) 50 MG tablet Take 50 mg by mouth 3 (three) times daily.    [provider]  raloxifene (EVISTA) 60 MG tablet Take 1 tablet (60 mg total) by mouth daily. Patient not taking: Reported on 06/19/2023 03/11/23   Pollyann Samples, NP    Current Outpatient Medications  Medication Sig Dispense Refill   Ascorbic Acid (VITAMIN C WITH ROSE HIPS) 1000 MG tablet Take 1,000 mg by mouth daily.     atorvastatin (LIPITOR) 10 MG tablet TAKE 1 TABLET BY MOUTH EVERY DAY 90  tablet 3   cetirizine (ZYRTEC) 10 MG tablet Take 10 mg by mouth daily.     losartan-hydrochlorothiazide (HYZAAR) 100-12.5 MG tablet Take 1 tablet by mouth daily. Reported on 01/23/2016 90 tablet 1   meclizine (ANTIVERT) 25 MG tablet Take 1 tablet (25 mg total) by mouth 3 (three) times daily as needed (vertigo). Reported on 01/23/2016 30 tablet 1   mirabegron ER (MYRBETRIQ) 25 MG TB24 tablet Take 1 tablet (25 mg total) by mouth daily. 30 tablet 5   Multiple Vitamins-Minerals (ALIVE WOMENS 50+) TABS Take 1 tablet by mouth daily.     potassium chloride (KLOR-CON M) 10 MEQ tablet Take 1 tablet (10 mEq total) by mouth daily. 90 tablet 3   pregabalin (LYRICA) 50 MG capsule Take 50 mg by mouth 3 (three) times daily.     psyllium (REGULOID) 0.52 g capsule Take 0.52 g by mouth daily.     UNABLE TO FIND Med Name: Provaitalize  2 Capsules each morning     cloNIDine (CATAPRES - DOSED IN MG/24 HR) 0.1 mg/24hr patch Place 1 patch (0.1 mg total) onto the  skin once a week. (Patient not taking: Reported on 06/19/2023) 4 patch 1   diclofenac (CATAFLAM) 50 MG tablet Take 50 mg by mouth 3 (three) times daily.     raloxifene (EVISTA) 60 MG tablet Take 1 tablet (60 mg total) by mouth daily. (Patient not taking: Reported on 06/19/2023) 60 tablet 0   Current Facility-Administered Medications  Medication Dose Route Frequency Provider Last Rate Last Admin   0.9 %  sodium chloride infusion  500 mL Intravenous Continuous Jameer Storie V, DO        Allergies as of 06/19/2023 - Review Complete 06/19/2023  Allergen Reaction Noted   Elemental sulfur Hives 10/25/2014    Family History  Problem Relation Age of Onset   Diabetes Father        borderline   Melanoma Father        dx 9s/60s; deceased 41s   Congestive Heart Failure Father    Colon cancer Mother        dx 75s; deceased 49   Hypertension Mother    Osteoporosis Mother    Ovarian cancer Maternal Grandmother        deceased 67s   Heart disease Paternal Grandfather    Melanoma Daughter        on leg; dx 30s    Social History   Socioeconomic History   Marital status: Married    Spouse name: Not on file   Number of children: Not on file   Years of education: Not on file   Highest education level: Not on file  Occupational History   Not on file  Tobacco Use   Smoking status: Never   Smokeless tobacco: Never  Vaping Use   Vaping status: Never Used  Substance and Sexual Activity   Alcohol use: Yes    Alcohol/week: 0.0 - 1.0 standard drinks of alcohol    Comment: socially   Drug use: No   Sexual activity: Not Currently    Partners: Male    Birth control/protection: Surgical, Post-menopausal    Comment: hysterectomy  Other Topics Concern   Not on file  Social History Narrative   Not on file   Social Determinants of Health   Financial Resource Strain: Not on file  Food Insecurity: Not on file  Transportation Needs: Not on file  Physical Activity: Not on file  Stress:  Not on file  Social  Connections: Not on file  Intimate Partner Violence: Not on file    Physical Exam: Vital signs in last 24 hours: @BP  (!) 158/90   Pulse 65   Temp 98.1 F (36.7 C)   Resp (!) 21   Ht 5\' 2"  (1.575 m)   Wt 136 lb (61.7 kg)   LMP 06/24/2008   SpO2 100%   BMI 24.87 kg/m  GEN: NAD EYE: Sclerae anicteric ENT: MMM CV: Non-tachycardic Pulm: CTA b/l GI: Soft, NT/ND NEURO:  Alert & Oriented x 3   Doristine Locks, DO [facility]   06/19/2023 10:51 AM

## 2023-06-19 NOTE — Progress Notes (Signed)
Called to room to assist during endoscopic procedure.  Patient ID and intended procedure confirmed with present staff. Received instructions for my participation in the procedure from the performing physician.  

## 2023-06-19 NOTE — Progress Notes (Signed)
Patient requested Zofran. Chin lift needed during EGD. Uneventful anesthetic. Report to pacu rn. Vss. Care resumed by rn.

## 2023-06-19 NOTE — Op Note (Signed)
[facility] Patient Name: Melissa Mcclure Procedure Date: 06/19/2023 10:45 AM MRN: [id number] Endoscopist: Doristine Locks , MD, [id number] Age: 67 Referring MD:  Date of Birth: Jun 04, 1956 Gender: Female Account #: [id number] Procedure:                Upper GI endoscopy Indications:              Dysphagia                           History of robotic hiatal hernia repair and toupee                            fundoplication in 03/2021. Reflux well controlled                            since then, but continued intermittent solid food                            dysphagia.                           Separetly, has been having diarrhea Medicines:                Monitored Anesthesia Care Procedure:                Pre-Anesthesia Assessment:                           - Prior to the procedure, a History and Physical                            was performed, and patient medications and                            allergies were reviewed. The patient's tolerance of                            previous anesthesia was also reviewed. The risks                            and benefits of the procedure and the sedation                            options and risks were discussed with the patient.                            All questions were answered, and informed consent                            was obtained. Prior Anticoagulants: The patient has                            taken no anticoagulant or antiplatelet agents. ASA  Grade Assessment: II - A patient with mild systemic                            disease. After reviewing the risks and benefits,                            the patient was deemed in satisfactory condition to                            undergo the procedure.                           After obtaining informed consent, the endoscope was                            passed under direct vision. Throughout the                            procedure, the patient's  blood pressure, pulse, and                            oxygen saturations were monitored continuously. The                            GIF [id number] #[id number] was introduced through the                            mouth, and advanced to the second part of duodenum.                            The upper GI endoscopy was accomplished without                            difficulty. The patient tolerated the procedure                            well. Scope In: Scope Out: Findings:                 The examined esophagus was normal. No areas of                            luminal narrowing, stricture, or rings.                           The Z-line was regular and was found 30 cm from the                            incisors.                           Evidence of a Toupet fundoplication was found in                            the cardia. The wrap appeared intact.  The entire stomach was normal.                           The examined duodenum was normal. Biopsies were                            taken with a cold forceps for histology. Estimated                            blood loss was minimal. Complications:            No immediate complications. Estimated Blood Loss:     Estimated blood loss was minimal. Impression:               - Normal esophagus.                           - Z-line regular, 30 cm from the incisors.                           - A Toupet fundoplication was found. The wrap                            appears intact.                           - Normal stomach.                           - Normal examined duodenum. Biopsied. Recommendation:           - Patient has a contact number available for                            emergencies. The signs and symptoms of potential                            delayed complications were discussed with the                            patient. Return to normal activities tomorrow.                            Written discharge  instructions were provided to the                            patient.                           - Resume previous diet.                           - Continue present medications.                           - Await pathology results.                           -  Perform routine esophageal manometry at                            appointment to be scheduled.                           - Colonoscopy today.                           - Can follow-up in the GI clinic after esophagal                            manometry. Doristine Locks, MD 06/19/2023 11:43:11 AM

## 2023-06-19 NOTE — Op Note (Signed)
[facility] Patient Name: Melissa Mcclure Procedure Date: 06/19/2023 10:35 AM MRN: [id number] Endoscopist: Doristine Locks , MD, [id number] Age: 67 Referring MD:  Date of Birth: May 14, 1956 Gender: Female Account #: [id number] Procedure:                Colonoscopy with biopsy Indications:              Colon cancer screening in patient at increased                            risk: Colorectal cancer in mother. Daughter with                            polyposis requring total colectomy.                           Last colonoscopy was 05/2018 and notable for                            diveritculosis, but no polyps.                           Separately, has been having diarrhea and fecal                            leakage. Medicines:                Monitored Anesthesia Care Procedure:                Pre-Anesthesia Assessment:                           - Prior to the procedure, a History and Physical                            was performed, and patient medications and                            allergies were reviewed. The patient's tolerance of                            previous anesthesia was also reviewed. The risks                            and benefits of the procedure and the sedation                            options and risks were discussed with the patient.                            All questions were answered, and informed consent                            was obtained. Prior Anticoagulants: The patient has  taken no anticoagulant or antiplatelet agents. ASA                            Grade Assessment: II - A patient with mild systemic                            disease. After reviewing the risks and benefits,                            the patient was deemed in satisfactory condition to                            undergo the procedure.                           After obtaining informed consent, the colonoscope                            was  passed under direct vision. Throughout the                            procedure, the patient's blood pressure, pulse, and                            oxygen saturations were monitored continuously. The                            Olympus [id number]-[id number] (#[id number]) Colonoscope was                            introduced through the anus and advanced to the the                            cecum, identified by appendiceal orifice and                            ileocecal valve. The colonoscopy was technically                            difficult and complex due to significant looping.                            The patient tolerated the procedure well. The                            quality of the bowel preparation was good. The                            ileocecal valve, appendiceal orifice, and rectum                            were photographed. Scope In: 11:08:06 AM Scope Out: 11:36:01 AM Scope Withdrawal Time: 0 hours 15 minutes 54 seconds  Total Procedure Duration: 0 hours 27 minutes  55 seconds  Findings:                 The perianal and digital rectal examinations were                            normal.                           Multiple large-mouthed and small-mouthed                            diverticula were found in the sigmoid colon and                            descending colon.                           Normal mucosa was found in the entire colon.                            Biopsies for histology were taken with a cold                            forceps from the right colon and left colon for                            evaluation of microscopic colitis. Estimated blood                            loss was minimal.                           The transverse colon and ascending colon revealed                            significantly excessive looping. Advancing the                            scope required using manual pressure and                            straightening and shortening the  scope to obtain                            bowel loop reduction.                           Non-bleeding internal hemorrhoids were found during                            retroflexion. The hemorrhoids were small and Grade                            I (internal hemorrhoids that do not prolapse). Complications:            No immediate complications. Estimated Blood Loss:  Estimated blood loss was minimal. Impression:               - Diverticulosis in the sigmoid colon and in the                            descending colon.                           - Normal mucosa in the entire examined colon.                            Biopsied.                           - There was significant looping of the colon.                           - Non-bleeding internal hemorrhoids. Recommendation:           - Patient has a contact number available for                            emergencies. The signs and symptoms of potential                            delayed complications were discussed with the                            patient. Return to normal activities tomorrow.                            Written discharge instructions were provided to the                            patient.                           - Resume previous diet.                           - Continue present medications.                           - Await pathology results.                           - Repeat colonoscopy in 5 years for screening                            purposes due to family history.                           - Use fiber, for example Citrucel, Fibercon, Konsyl                            or Metamucil. Doristine Locks, MD 06/19/2023 11:49:05 AM

## 2023-06-19 NOTE — Patient Instructions (Signed)
Please read handouts provided. Continue present medications. Await pathology results. Repeat colonoscopy in 5 years for screening. Consider a fiber supplement. Follow-up in GI clinic as needed.   YOU HAD AN ENDOSCOPIC PROCEDURE TODAY AT THE [facility]:   Refer to the procedure report that was given to you for any specific questions about what was found during the examination.  If the procedure report does not answer your questions, please call your gastroenterologist to clarify.  If you requested that your care partner not be given the details of your procedure findings, then the procedure report has been included in a sealed envelope for you to review at your convenience later.  YOU SHOULD EXPECT: Some feelings of bloating in the abdomen. Passage of more gas than usual.  Walking can help get rid of the air that was put into your GI tract during the procedure and reduce the bloating. If you had a lower endoscopy (such as a colonoscopy or flexible sigmoidoscopy) you may notice spotting of blood in your stool or on the toilet paper. If you underwent a bowel prep for your procedure, you may not have a normal bowel movement for a few days.  Please Note:  You might notice some irritation and congestion in your nose or some drainage.  This is from the oxygen used during your procedure.  There is no need for concern and it should clear up in a day or so.  SYMPTOMS TO REPORT IMMEDIATELY:  Following lower endoscopy (colonoscopy or flexible sigmoidoscopy):  Excessive amounts of blood in the stool  Significant tenderness or worsening of abdominal pains  Swelling of the abdomen that is new, acute  Fever of 100F or higher  Following upper endoscopy (EGD)  Vomiting of blood or coffee ground material  New chest pain or pain under the shoulder blades  Painful or persistently difficult swallowing  New shortness of breath  Fever of 100F or higher  Black, tarry-looking stools  For urgent  or emergent issues, a gastroenterologist can be reached at any hour by calling [phone]. Do not use MyChart messaging for urgent concerns.    DIET:  We do recommend a small meal at first, but then you may proceed to your regular diet.  Drink plenty of fluids but you should avoid alcoholic beverages for 24 hours.  ACTIVITY:  You should plan to take it easy for the rest of today and you should NOT DRIVE or use heavy machinery until tomorrow (because of the sedation medicines used during the test).    FOLLOW UP: Our staff will call the number listed on your records the next business day following your procedure.  We will call around 7:15- 8:00 am to check on you and address any questions or concerns that you may have regarding the information given to you following your procedure. If we do not reach you, we will leave a message.     If any biopsies were taken you will be contacted by phone or by letter within the next 1-3 weeks.  Please call us at [phone] if you have not heard about the biopsies in 3 weeks.    SIGNATURES/CONFIDENTIALITY: You and/or your care partner have signed paperwork which will be entered into your electronic medical record.  These signatures attest to the fact that that the information above on your After Visit Summary has been reviewed and is understood.  Full responsibility of the confidentiality of this discharge information lies with you and/or your care-partner.

## 2023-06-20 ENCOUNTER — Telehealth: Payer: Self-pay

## 2023-06-20 ENCOUNTER — Telehealth: Payer: Self-pay | Admitting: Gastroenterology

## 2023-06-20 ENCOUNTER — Other Ambulatory Visit: Payer: Self-pay | Admitting: *Deleted

## 2023-06-20 DIAGNOSIS — R1310 Dysphagia, unspecified: Secondary | ICD-10-CM

## 2023-06-20 NOTE — Telephone Encounter (Signed)
I have spoken to patient to advise that she has been scheduled for esophageal manometry at [facility] on the first availability, 10/16/23 as ordered at her recent endoscopy. Patient has been given time/date/location/prep for procedure and written instructions have also been made available via mychart. Patient verbalizes understanding.

## 2023-06-20 NOTE — Telephone Encounter (Signed)
Inbound call from patient stating she was advised that she would need a manometry after yesterday's 9/25 endoscopy. Patient requesting a call to discuss next steps. Please advise, thank you.

## 2023-06-20 NOTE — Telephone Encounter (Signed)
  Follow up Call-     06/19/2023   10:21 AM  Call back number  Post procedure Call Back phone  # [phone]  Permission to leave phone message Yes     Patient questions:  Do you have a fever, pain , or abdominal swelling? No. Pain Score  0 *  Have you tolerated food without any problems? Yes.    Have you been able to return to your normal activities? Yes.    Do you have any questions about your discharge instructions: Diet   No. Medications  No. Follow up visit  No.  Do you have questions or concerns about your Care? No.  Actions: * If pain score is 4 or above: No action needed, pain <4.

## 2023-06-24 LAB — SURGICAL PATHOLOGY

## 2023-06-25 ENCOUNTER — Ambulatory Visit: Payer: BC Managed Care – PPO | Admitting: Nurse Practitioner

## 2023-06-26 DIAGNOSIS — F411 Generalized anxiety disorder: Secondary | ICD-10-CM | POA: Diagnosis not present

## 2023-06-28 ENCOUNTER — Ambulatory Visit: Payer: BC Managed Care – PPO | Admitting: Obstetrics and Gynecology

## 2023-06-28 NOTE — Progress Notes (Deleted)
[facility] Return Visit  SUBJECTIVE  History of Present Illness: Melissa Mcclure is a 67 y.o. female seen in follow-up for OAB and bowel leakage. Plan at last visit was to start Myrbetriq 25mg  daily. She also underwent colonoscopy which was negative for microscopic colitis. .     Past Medical History: Patient  has a past medical history of Arthritis, Cancer (HCC) (04/2010), Chronic back pain, Complication of anesthesia, Ductal carcinoma in situ (DCIS) of right breast (10/2017), Family history of adverse reaction to anesthesia, Family history of colon cancer, Family history of melanoma, Family history of ovarian cancer, Genetic testing (12/23/2017), GERD (gastroesophageal reflux disease), H/O hiatal hernia, Heart murmur, History of hand surgery, Hyperlipidemia, Hypertension, Left rotator cuff tear, Malignant melanoma of lower leg (HCC) (03/10/2015), Melanoma of right upper arm (HCC) (10/12/2013), PONV (postoperative nausea and vomiting), Seasonal allergies, and Vertigo.   Past Surgical History: She  has a past surgical history that includes Melanoma excision; Anterior lat lumbar fusion (Right, 04/17/2013); Lumbar percutaneous pedicle screw 3 level (N/A, 04/17/2013); Tubal ligation (1986); Cesarean section (1978); Foot surgery (Right); WLE (06/17/2007); Shoulder surgery (Right, 04/28/2015); Cesarean section (1980); Dilatation & curettage/hysteroscopy with myosure (N/A, 01/24/2016); Colonoscopy; Laparoscopic hysterectomy (Bilateral, 06/26/2016); Cystoscopy (N/A, 06/26/2016); Salpingoophorectomy (Bilateral, 06/26/2016); Breast lumpectomy with radioactive seed localization (Right, 12/25/2017); Colonoscopy with propofol (N/A, 06/23/2018); Strabismus surgery (Bilateral, 07/29/2020); Breast surgery; Back surgery; Hand surgery (Bilateral); Xi robotic assisted hiatal hernia repair (N/A, 04/04/2021); Shoulder surgery (Left, 2023); and Hernia repair.   Medications: She has a current medication list  which includes the following prescription(s): vitamin c with rose hips, atorvastatin, cetirizine, clonidine, diclofenac, losartan-hydrochlorothiazide, meclizine, mirabegron er, alive womens 50+, potassium chloride, pregabalin, psyllium, raloxifene, and UNABLE TO FIND.   Allergies: Patient is allergic to elemental sulfur.   Social History: Patient  reports that she has never smoked. She has never used smokeless tobacco. She reports current alcohol use. She reports that she does not use drugs.      OBJECTIVE     Physical Exam: There were no vitals filed for this visit. Gen: No apparent distress, A&O x 3.  Detailed Urogynecologic Evaluation:  Deferred. Prior exam showed:      No data to display             ASSESSMENT AND PLAN    [name] is a 67 y.o. with:  No diagnosis found.

## 2023-07-08 DIAGNOSIS — M546 Pain in thoracic spine: Secondary | ICD-10-CM | POA: Diagnosis not present

## 2023-07-08 DIAGNOSIS — M419 Scoliosis, unspecified: Secondary | ICD-10-CM | POA: Diagnosis not present

## 2023-07-08 DIAGNOSIS — M5416 Radiculopathy, lumbar region: Secondary | ICD-10-CM | POA: Diagnosis not present

## 2023-07-09 ENCOUNTER — Encounter: Payer: Self-pay | Admitting: Obstetrics and Gynecology

## 2023-07-09 ENCOUNTER — Ambulatory Visit: Payer: BC Managed Care – PPO | Admitting: Obstetrics and Gynecology

## 2023-07-09 VITALS — BP 124/82 | HR 69

## 2023-07-09 DIAGNOSIS — N3281 Overactive bladder: Secondary | ICD-10-CM | POA: Diagnosis not present

## 2023-07-09 DIAGNOSIS — R159 Full incontinence of feces: Secondary | ICD-10-CM | POA: Diagnosis not present

## 2023-07-09 MED ORDER — MIRABEGRON ER 50 MG PO TB24
50.0000 mg | ORAL_TABLET | Freq: Every day | ORAL | 5 refills | Status: DC
Start: 2023-07-09 — End: 2023-12-09

## 2023-07-09 NOTE — Progress Notes (Signed)
[facility] Return Visit  SUBJECTIVE  History of Present Illness: Melissa Mcclure is a 67 y.o. female seen in follow-up for OAB and bowel leakage. Plan at last visit was to start Myrbetriq 25mg  daily. She also underwent colonoscopy which was negative for microscopic colitis.   Initially she had a lot  less leakage but now has occasional urgency especially in the morning. Leaks on the way to the bathroom.    Still very bothered by bowel leakage. She is using the metamucil capsules daily. She reports she is still having bowel leakage. She feels maybe she has some dairy sensitivity (milk) and desserts. She has leakage a few times a month, hard to predict.   Past Medical History: Patient  has a past medical history of Arthritis, Cancer (HCC) (04/2010), Chronic back pain, Complication of anesthesia, Ductal carcinoma in situ (DCIS) of right breast (10/2017), Family history of adverse reaction to anesthesia, Family history of colon cancer, Family history of melanoma, Family history of ovarian cancer, Genetic testing (12/23/2017), GERD (gastroesophageal reflux disease), H/O hiatal hernia, Heart murmur, History of hand surgery, Hyperlipidemia, Hypertension, Left rotator cuff tear, Malignant melanoma of lower leg (HCC) (03/10/2015), Melanoma of right upper arm (HCC) (10/12/2013), PONV (postoperative nausea and vomiting), Seasonal allergies, and Vertigo.   Past Surgical History: She  has a past surgical history that includes Melanoma excision; Anterior lat lumbar fusion (Right, 04/17/2013); Lumbar percutaneous pedicle screw 3 level (N/A, 04/17/2013); Tubal ligation (1986); Cesarean section (1978); Foot surgery (Right); WLE (06/17/2007); Shoulder surgery (Right, 04/28/2015); Cesarean section (1980); Dilatation & curettage/hysteroscopy with myosure (N/A, 01/24/2016); Colonoscopy; Laparoscopic hysterectomy (Bilateral, 06/26/2016); Cystoscopy (N/A, 06/26/2016); Salpingoophorectomy (Bilateral, 06/26/2016);  Breast lumpectomy with radioactive seed localization (Right, 12/25/2017); Colonoscopy with propofol (N/A, 06/23/2018); Strabismus surgery (Bilateral, 07/29/2020); Breast surgery; Back surgery; Hand surgery (Bilateral); Xi robotic assisted hiatal hernia repair (N/A, 04/04/2021); Shoulder surgery (Left, 2023); and Hernia repair.   Medications: She has a current medication list which includes the following prescription(s): vitamin c with rose hips, atorvastatin, cetirizine, diclofenac, losartan-hydrochlorothiazide, meclizine, mirabegron er, mirabegron er, alive womens 50+, potassium chloride, pregabalin, psyllium, raloxifene, UNABLE TO FIND, and clonidine.   Allergies: Patient is allergic to elemental sulfur.   Social History: Patient  reports that she has never smoked. She has never used smokeless tobacco. She reports current alcohol use. She reports that she does not use drugs.      OBJECTIVE     Physical Exam: Vitals:   07/09/23 1410  BP: 124/82  Pulse: 69   Gen: No apparent distress, A&O x 3.  Detailed Urogynecologic Evaluation:  Deferred.    ASSESSMENT AND PLAN    Ms. Grand is a 67 y.o. with:  1. Overactive bladder   2. Incontinence of feces, unspecified fecal incontinence type    - For bladder and bowel leakage with urgency, would recommend sacral neuromodulation.  It requires a test phase, and documentation of bladder function via diary. After a successful test period, a permanent wire and generator are placed in the OR. The battery lasts 10-15 years on average and would need to be replaced surgically.  The goal of this therapy is at least a 50% improvement in symptoms. It is NOT realistic to expect a 100% cure.  We reviewed the fact that about 30% of patients fail the test phase and are not candidates for permanent generator placement.  Device is MRI compatible.  - Handouts provided about the axonics device.   - In the meantime, she wants to increase Myrbetriq to  50mg  daily.    Return for Basic Evaluation for SNM  Marguerita Beards, MD

## 2023-07-12 ENCOUNTER — Encounter: Payer: Self-pay | Admitting: Nurse Practitioner

## 2023-07-12 ENCOUNTER — Ambulatory Visit: Payer: BC Managed Care – PPO | Admitting: Nurse Practitioner

## 2023-07-12 VITALS — BP 124/76 | HR 90 | Temp 97.4°F | Wt 132.2 lb

## 2023-07-12 DIAGNOSIS — N3001 Acute cystitis with hematuria: Secondary | ICD-10-CM

## 2023-07-12 DIAGNOSIS — R399 Unspecified symptoms and signs involving the genitourinary system: Secondary | ICD-10-CM

## 2023-07-12 DIAGNOSIS — N3281 Overactive bladder: Secondary | ICD-10-CM

## 2023-07-12 LAB — POC URINALSYSI DIPSTICK (AUTOMATED)
Bilirubin, UA: NEGATIVE
Glucose, UA: NEGATIVE
Ketones, UA: NEGATIVE
Nitrite, UA: NEGATIVE
Protein, UA: POSITIVE — AB
Spec Grav, UA: 1.02 (ref 1.010–1.025)
Urobilinogen, UA: 0.2 U/dL
pH, UA: 6 (ref 5.0–8.0)

## 2023-07-12 MED ORDER — CIPROFLOXACIN HCL 500 MG PO TABS: 500.0000 mg | ORAL_TABLET | Freq: Two times a day (BID) | ORAL | 0 refills | Status: AC

## 2023-07-12 NOTE — Patient Instructions (Signed)
It was great to see you!  Start cipro 1 tablet twice a day for 7 days  Keep drinking plenty of fluids.   Let's follow-up if symptoms worse or don't improve.     Take care,  Rodman Pickle, NP

## 2023-07-12 NOTE — Progress Notes (Unsigned)
Acute Office Visit  Subjective:     Patient ID: Melissa Mcclure, female    DOB: [birth year], 67 y.o.   MRN: [id number]  Chief Complaint  Patient presents with   UTI Symptoms    Lower abdominal and flank pain with chills, pressure with little urination    HPI Patient is in today for dysuria, and flank pain that started yesterday. She states that last week she was instructed to increase her myrbetriq to 50 mg daily. She states that she did this and then started having trouble emptying her bladder. Then a few days later, the UTI symptoms started.   URINARY SYMPTOMS  Dysuria: yes Urinary frequency: yes Urgency: yes Small volume voids: yes Urinary incontinence:  at times Foul odor: yes Hematuria: no Abdominal pain: no Back pain: no Suprapubic pain/pressure: yes Flank pain: yes Fever:  no Vomiting: no Relief with cranberry juice:  some Relief with pyridium:  some Status: stable Previous urinary tract infection: yes Recurrent urinary tract infection: no Treatment: Increasing fluids, azo, cranberry    ROS See pertinent positives and negatives per HPI.      Objective:    BP 124/76 (BP Location: Left Arm, Patient Position: Sitting, Cuff Size: Large)   Pulse 90   Temp (!) 97.4 F (36.3 C) (Temporal)   Wt 132 lb 3.2 oz (60 kg)   LMP 06/24/2008   SpO2 100%   BMI 24.18 kg/m    Physical Exam Vitals and nursing note reviewed.  Constitutional:      General: She is not in acute distress.    Appearance: Normal appearance.  HENT:     Head: Normocephalic.  Eyes:     Conjunctiva/sclera: Conjunctivae normal.  Cardiovascular:     Rate and Rhythm: Normal rate and regular rhythm.     Pulses: Normal pulses.     Heart sounds: Normal heart sounds.  Pulmonary:     Effort: Pulmonary effort is normal.     Breath sounds: Normal breath sounds.  Abdominal:     Palpations: Abdomen is soft.     Tenderness: There is no abdominal tenderness. There is right CVA tenderness (slight)  and left CVA tenderness (slight).  Musculoskeletal:     Cervical back: Normal range of motion.  Skin:    General: Skin is warm.  Neurological:     General: No focal deficit present.     Mental Status: She is alert and oriented to person, place, and time.  Psychiatric:        Mood and Affect: Mood normal.        Behavior: Behavior normal.        Thought Content: Thought content normal.        Judgment: Judgment normal.     Results for orders placed or performed in visit on 07/12/23  POCT Urinalysis Dipstick (Automated)  Result Value Ref Range   Color, UA yellow    Clarity, UA cloudy    Glucose, UA Negative Negative   Bilirubin, UA negative    Ketones, UA negative    Spec Grav, UA 1.020 1.010 - 1.025   Blood, UA 3+    pH, UA 6.0 5.0 - 8.0   Protein, UA Positive (A) Negative   Urobilinogen, UA 0.2 0.2 or 1.0 E.U./dL   Nitrite, UA negative    Leukocytes, UA Large (3+) (A) Negative        Assessment & Plan:   Problem List Items Addressed This Visit       Genitourinary  Overactive bladder    She had recently increased her myrbetriq to 50mg  daily from her uro-gyn and she states that this caused her to retain urine and have trouble emptying her bladder. She only took 50mg  one day and has since stopped the medication. Encouraged her to reach out to her uro-gyn.       Other Visit Diagnoses     Acute cystitis with hematuria    -  Primary   Will treat with cipro 500mg  BID x7 days. Encourage fluids, can continue azo and cranberry. F/U if not improving.   Relevant Orders   Urine Culture   UTI symptoms       U/A shows 3+ leukocytes and 3+ blood. Will treat for UTI. She does have history of kidney stones, and if symtpoms don't improve, may need imaging.   Relevant Orders   POCT Urinalysis Dipstick (Automated) (Completed)       Meds ordered this encounter  Medications   ciprofloxacin (CIPRO) 500 MG tablet    Sig: Take 1 tablet (500 mg total) by mouth 2 (two) times daily  for 7 days.    Dispense:  14 tablet    Refill:  0    Return if symptoms worsen or fail to improve.  [name], NP

## 2023-07-13 ENCOUNTER — Encounter: Payer: Self-pay | Admitting: Nurse Practitioner

## 2023-07-13 NOTE — Assessment & Plan Note (Signed)
She had recently increased her myrbetriq to 50mg  daily from her uro-gyn and she states that this caused her to retain urine and have trouble emptying her bladder. She only took 50mg  one day and has since stopped the medication. Encouraged her to reach out to her uro-gyn.

## 2023-07-15 LAB — URINE CULTURE
MICRO NUMBER:: 15613846
SPECIMEN QUALITY:: ADEQUATE

## 2023-07-24 DIAGNOSIS — F411 Generalized anxiety disorder: Secondary | ICD-10-CM | POA: Diagnosis not present

## 2023-08-02 ENCOUNTER — Other Ambulatory Visit: Payer: Self-pay | Admitting: Family Medicine

## 2023-08-02 ENCOUNTER — Other Ambulatory Visit: Payer: Self-pay | Admitting: Nurse Practitioner

## 2023-08-02 DIAGNOSIS — I10 Essential (primary) hypertension: Secondary | ICD-10-CM

## 2023-08-02 DIAGNOSIS — E7800 Pure hypercholesterolemia, unspecified: Secondary | ICD-10-CM

## 2023-08-02 NOTE — Telephone Encounter (Signed)
Requesting: LOSARTAN-HCTZ 100-12.5 MG TAB  Last Visit: 07/12/2023 Next Visit: Visit date not found Last Refill: 11/23/2022  Please Advise

## 2023-08-29 ENCOUNTER — Encounter: Payer: Self-pay | Admitting: Obstetrics and Gynecology

## 2023-08-29 ENCOUNTER — Ambulatory Visit: Payer: BC Managed Care – PPO | Admitting: Obstetrics and Gynecology

## 2023-08-29 VITALS — BP 120/80 | HR 63

## 2023-08-29 DIAGNOSIS — N3281 Overactive bladder: Secondary | ICD-10-CM

## 2023-08-29 DIAGNOSIS — R159 Full incontinence of feces: Secondary | ICD-10-CM

## 2023-08-29 MED ORDER — SODIUM BICARBONATE 8.4 % IV SOLN
5.0000 mL | Freq: Once | INTRAVENOUS | Status: AC
Start: 2023-08-29 — End: 2023-08-29
  Administered 2023-08-29: 5 mL

## 2023-08-29 MED ORDER — LIDOCAINE-EPINEPHRINE 1 %-1:100000 IJ SOLN
20.0000 mL | Freq: Once | INTRAMUSCULAR | Status: AC
Start: 2023-08-29 — End: 2023-08-29
  Administered 2023-08-29: 20 mL via INTRADERMAL

## 2023-08-29 NOTE — Progress Notes (Signed)
Interstim Basic Evaluation (PNE) Procedure   Procedure: Sacral Neuromodulator percutaneous sacral stimulation test using bony landmarks. Procedure repeated contralaterally for bilateral test lead placement.    Indication: 67 y.o. F with diagnosis of refractory overactive bladder and fecal incontinence. Pt reports voiding 5-6 times per day but has urgency with every void. She has large amount of leakage without warning at first waking up during the day, sometimes also has leakage throughout the day as well. Fecal incontinence occurs a few times a month, usually for a few days in a row, when stools are looser. Informed consent has been obtained for PNE procedure for sacral nerve stimulation.    Description:  Patient was properly identified and placed in the prone position. Patient was positioned to allow the toes to dangle freely. A grounding pad was placed on the bottom of the patient's foot and the cable was connected  to the grounding pad and external stimulation box. The patient was then prepped and draped in the usual sterile fashion. 9cm was measured from the tip of the coccyx superiorly in the midline, then 2cm superiorly and 2cm laterally to mark the foramen bilaterally. Local injection of 20ml 1% lidocaine with epinephrine with 2ml of sodium bicarbonate was injected at the planned entry site bilaterally.    The foramen needle was placed at a 60 degree angle into the right S3 foramen without difficulty. The proper S3 position was confirmed by the patient's sensation to stimulation utilizing the external test stimulator. This was repeated on the left side. The needle stylet was removed and the percutaneous lead was inserted. Using a push and pull technique, the needle was taken out over the lead. This was then repeated on the opposite side.    The patient cables were connected to the leads and grounding pads. The patient was cleaned off and the entire region was covered with tegaderms to secure the  leads. The estimated blood loss was negligible. The patient tolerated the procedure well with no complications. Using the external test stimulator, the patient was programmed to the optimum sensation via the lead, and given instructions. Urinary diary will be kept until the return appointment. She was given post-procedure instructions and precautions to call the office with any concerns.    Return 1 week for follow up and lead removal.     Marguerita Beards, MD

## 2023-09-03 ENCOUNTER — Encounter: Payer: Self-pay | Admitting: Obstetrics and Gynecology

## 2023-09-03 ENCOUNTER — Ambulatory Visit: Payer: BC Managed Care – PPO | Admitting: Obstetrics and Gynecology

## 2023-09-03 VITALS — BP 139/82 | HR 64

## 2023-09-03 DIAGNOSIS — R159 Full incontinence of feces: Secondary | ICD-10-CM

## 2023-09-03 DIAGNOSIS — N3281 Overactive bladder: Secondary | ICD-10-CM

## 2023-09-03 NOTE — Progress Notes (Signed)
[facility] Return Visit  SUBJECTIVE  History of Present Illness: Melissa Mcclure is a 67 y.o. female seen in follow-up after in office PNE trial.     Daily voids: 8 to 5,7,7,6 (No major change, WNL)  Nightly Voids: 0 to 0 (No change)  Urgency: 4 to 0, 0, 1, 0 (Patient experienced much less situations of high urgency) 100% improvement.  Leaks per day: 2 to 0, 0, 0, 1 (100% improvement)  Pads per day: 1 to 0, 0, 0, 1 (100% impreovement)  Patient reports a >50% improvement in her bladder urgency and leakage but reports she is unsure if she wants to proceed with permanent implant.   She reports she was hoping to have more assistance with her bowel leakage than what she got with this trial. She reports still having fecal smearing and urgency.    Past Medical History: Patient  has a past medical history of Arthritis, Cancer (HCC) (04/2010), Chronic back pain, Complication of anesthesia, Ductal carcinoma in situ (DCIS) of right breast (10/2017), Family history of adverse reaction to anesthesia, Family history of colon cancer, Family history of melanoma, Family history of ovarian cancer, Genetic testing (12/23/2017), GERD (gastroesophageal reflux disease), H/O hiatal hernia, Heart murmur, History of hand surgery, Hyperlipidemia, Hypertension, Left rotator cuff tear, Malignant melanoma of lower leg (HCC) (03/10/2015), Melanoma of right upper arm (HCC) (10/12/2013), PONV (postoperative nausea and vomiting), Seasonal allergies, and Vertigo.   Past Surgical History: She  has a past surgical history that includes Melanoma excision; Anterior lat lumbar fusion (Right, 04/17/2013); Lumbar percutaneous pedicle screw 3 level (N/A, 04/17/2013); Tubal ligation (1986); Cesarean section (1978); Foot surgery (Right); WLE (06/17/2007); Shoulder surgery (Right, 04/28/2015); Cesarean section (1980); Dilatation & curettage/hysteroscopy with myosure (N/A, 01/24/2016); Colonoscopy; Laparoscopic  hysterectomy (Bilateral, 06/26/2016); Cystoscopy (N/A, 06/26/2016); Salpingoophorectomy (Bilateral, 06/26/2016); Breast lumpectomy with radioactive seed localization (Right, 12/25/2017); Colonoscopy with propofol (N/A, 06/23/2018); Strabismus surgery (Bilateral, 07/29/2020); Breast surgery; Back surgery; Hand surgery (Bilateral); Xi robotic assisted hiatal hernia repair (N/A, 04/04/2021); Shoulder surgery (Left, 2023); and Hernia repair.   Medications: She has a current medication list which includes the following prescription(s): vitamin c with rose hips, atorvastatin, cetirizine, clonidine, diclofenac, losartan-hydrochlorothiazide, meclizine, mirabegron er, mirabegron er, alive womens 50+, potassium chloride, pregabalin, psyllium, raloxifene, and UNABLE TO FIND.   Allergies: Patient is allergic to elemental sulfur.   Social History: Patient  reports that she has never smoked. She has never used smokeless tobacco. She reports current alcohol use. She reports that she does not use drugs.      OBJECTIVE     Physical Exam: Vitals:   09/03/23 0930  BP: 139/82  Pulse: 64   Gen: No apparent distress, A&O x 3. The leads of the SNM device were pulled without difficulty and hemostasis was noted. A small gauze pad was applied with paper tape for patient.    ASSESSMENT AND PLAN    Ms. Lamper is a 67 y.o. with:  1. Overactive bladder   2. Incontinence of feces, unspecified fecal incontinence type    Patient and I discussed the option of going forth with permanent implantation of the sacral neuromodulation device versus waiting and patient would prefer to wait at this time.  Patient reports that she is not convinced that the sacral neuromodulation device is the best route for her as it did not improve her fecal incontinence is much as she was hoping for.  While the bladder incontinence was a greater than 50% improved and her urgency was extremely  well improved, she is unsure about moving forward  with permanent implantation. Patient reports she would like to cancel the implantation on December 27.  She would like to further explore her options and reports that she may later choose to go forward with permanent implantation.  Will inform surgeon of patient's choice.  Patient to follow up with Dr. Florian Buff to further discuss    Selmer Dominion, NP

## 2023-09-07 ENCOUNTER — Other Ambulatory Visit: Payer: Self-pay | Admitting: Nurse Practitioner

## 2023-09-07 DIAGNOSIS — I10 Essential (primary) hypertension: Secondary | ICD-10-CM

## 2023-09-10 NOTE — Telephone Encounter (Signed)
Requesting:  LOSARTAN-HCTZ 100-12.5 MG TAB  Last Visit: 07/12/2023 Next Visit: Visit date not found Last Refill: 08/02/2023  Please Advise

## 2023-10-16 ENCOUNTER — Encounter (HOSPITAL_COMMUNITY)
Admission: RE | Disposition: A | Discharge status: Home / Self Care | Payer: Self-pay | Source: Home / Self Care | Attending: Gastroenterology

## 2023-10-16 ENCOUNTER — Ambulatory Visit (HOSPITAL_COMMUNITY)
Admission: RE | Admit: 2023-10-16 | Discharge: 2023-10-16 | Disposition: A | Discharge status: Home / Self Care | Payer: BC Managed Care – PPO | Attending: Gastroenterology | Admitting: Gastroenterology

## 2023-10-16 DIAGNOSIS — R1310 Dysphagia, unspecified: Secondary | ICD-10-CM | POA: Diagnosis not present

## 2023-10-16 DIAGNOSIS — R1319 Other dysphagia: Secondary | ICD-10-CM

## 2023-10-16 HISTORY — PX: ESOPHAGEAL MANOMETRY: SHX5429

## 2023-10-16 SURGERY — MANOMETRY, ESOPHAGUS

## 2023-10-16 MED ORDER — LIDOCAINE VISCOUS HCL 2 % MT SOLN
OROMUCOSAL | Status: AC
  Filled 2023-10-16: qty 15

## 2023-10-16 SURGICAL SUPPLY — 2 items
FACESHIELD LNG OPTICON STERILE (SAFETY) IMPLANT
GLOVE BIO SURGEON STRL SZ8 (GLOVE) ×2 IMPLANT

## 2023-10-16 NOTE — Progress Notes (Signed)
Esophageal Manometry done per protocol. Patient tolerated well without distress or complication.  

## 2023-10-18 ENCOUNTER — Encounter (HOSPITAL_COMMUNITY): Payer: Self-pay | Admitting: Gastroenterology

## 2023-11-11 ENCOUNTER — Encounter: Payer: Self-pay | Admitting: Gastroenterology

## 2023-12-09 ENCOUNTER — Encounter (HOSPITAL_BASED_OUTPATIENT_CLINIC_OR_DEPARTMENT_OTHER): Payer: Self-pay | Admitting: Obstetrics & Gynecology

## 2023-12-09 ENCOUNTER — Ambulatory Visit (HOSPITAL_BASED_OUTPATIENT_CLINIC_OR_DEPARTMENT_OTHER): Payer: BC Managed Care – PPO | Admitting: Obstetrics & Gynecology

## 2023-12-09 VITALS — BP 164/90 | HR 60 | Ht 62.0 in | Wt 135.0 lb

## 2023-12-09 DIAGNOSIS — I10 Essential (primary) hypertension: Secondary | ICD-10-CM

## 2023-12-09 DIAGNOSIS — R197 Diarrhea, unspecified: Secondary | ICD-10-CM

## 2023-12-09 DIAGNOSIS — Z01419 Encounter for gynecological examination (general) (routine) without abnormal findings: Secondary | ICD-10-CM

## 2023-12-09 DIAGNOSIS — D0511 Intraductal carcinoma in situ of right breast: Secondary | ICD-10-CM

## 2023-12-09 DIAGNOSIS — L292 Pruritus vulvae: Secondary | ICD-10-CM

## 2023-12-09 DIAGNOSIS — Z9189 Other specified personal risk factors, not elsewhere classified: Secondary | ICD-10-CM | POA: Diagnosis not present

## 2023-12-09 DIAGNOSIS — Z853 Personal history of malignant neoplasm of breast: Secondary | ICD-10-CM

## 2023-12-09 DIAGNOSIS — Z8041 Family history of malignant neoplasm of ovary: Secondary | ICD-10-CM

## 2023-12-09 DIAGNOSIS — N3281 Overactive bladder: Secondary | ICD-10-CM | POA: Diagnosis not present

## 2023-12-09 MED ORDER — TRIAMCINOLONE ACETONIDE 0.5 % EX OINT
1.0000 | TOPICAL_OINTMENT | Freq: Two times a day (BID) | CUTANEOUS | 0 refills | Status: DC
Start: 2023-12-09 — End: 2024-04-20

## 2023-12-09 NOTE — Progress Notes (Signed)
 Breast and Pelvic Exam Patient name: Melissa Mcclure MRN [id number]  Date of birth: May 14, 1956 Chief Complaint:   Breast and Pelvic  History of Present Illness:   Melissa Mcclure is a 68 y.o. G39P1003 Caucasian female being seen today for breast and pelvic exam.  Reports a little vulvar itching that is in a very specific location.  Denies vaginal bleeding.  Hot flashes are much improved.    Continues to have issues with certain foods causing her a lot of diarrhea.  Has figured out that she can't drink chick fila milkshakes, for exam.  Not sure if she has a food allergy or malabsorptive issues.  Thought for a while it was sugar, but at apple crumble pie this weekend and had no issues.  Still having some issues with feeling like something is stuck in her throat.  Did esophageal manometry and this was normal (at least per my review).  Has not had follow up.  She called the office and was advised she would get a call back.  Has seen Dr. Barron Alvine and colonoscopy was normal as well.  Just not sure what to do at this point.  Did see urogyn last year.  She is was placed on myrbetriq.  Has stopped this.  Sacral nerve stimulator trial done.  Didn't really helped that much so did not have this placed.    Having some balance issues and she does have vertigo.  Wants to know if she needs to do anything else.  She has done physical therapy.  Didn't feel this really helped very much.    Patient's last menstrual period was 06/24/2008.   Last pap not indicated.   Last mammogram: 12/20/2022. Results were: normal. Family h/o breast cancer: no Last colonoscopy: 06/18/2024. Results were: normal. Family h/o colorectal cancer: yes mother.  Follow up 5 years.   DEXA:  11/2022.  Normal.       12/09/2023   10:55 AM 05/28/2023   10:54 AM 11/20/2022    9:48 AM 05/25/2022   12:16 PM 11/22/2021   10:32 AM  Depression screen PHQ 2/9  Decreased Interest 0 0 0 0 0  Down, Depressed, Hopeless 0 0 0 0 0  PHQ - 2 Score 0 0 0 0 0   Altered sleeping  0     Tired, decreased energy  0     Change in appetite  0     Feeling bad or failure about yourself   0     Trouble concentrating  0     Moving slowly or fidgety/restless  0     Suicidal thoughts  0     PHQ-9 Score  0        Review of Systems:   Pertinent items are noted in HPI  Denies any vaginal bleeding or vaginal discharge.  Bowel issues as per above.   Pertinent History Reviewed:  Reviewed past medical,surgical, social and family history.   Reviewed problem list, medications and allergies. Physical Assessment:   Vitals:   12/09/23 1049  BP: (!) 173/95  Pulse: 60  Weight: 135 lb (61.2 kg)  Height: 5\' 2"  (1.575 m)  Body mass index is 24.69 kg/m.        Physical Examination:   General appearance - well appearing, and in no distress  Mental status - alert, oriented to person, place, and time  Psych:  She has a normal mood and affect  Skin - warm and dry, normal color, no suspicious lesions noted  Chest - effort normal, all lung fields clear to auscultation bilaterally  Heart - normal rate and regular rhythm  Neck:  midline trachea, no thyromegaly or nodules  Breasts - breasts appear normal, no suspicious masses, no skin or nipple changes or  axillary nodes  Abdomen - soft, nontender, nondistended, no masses or organomegaly  Pelvic - VULVA: normal appearing vulva with no masses,no specific skin changes noted in area where pt is experiencing itching  VAGINA: normal appearing vagina with normal color and discharge, no lesions   CERVIX: surgically absent  Thin prep pap is not done  UTERUS: uterus is felt to be normal size, shape, consistency and nontender   ADNEXA: No adnexal masses or tenderness noted.  Rectal - normal rectal, good sphincter tone, no masses felt.   Extremities:  No swelling or varicosities noted  Chaperone present for exam, Ina Homes, CMA  Assessment & Plan:  1. GYN exam for high-risk Medicare patient (Primary) - Pap smear not  indicated - Mammogram 12/20/2022 - Colonoscopy 11/2022 - Bone mineral density 2017, follow up 10 years - lab work done with PCP - vaccines reviewed/updated  2. Family history of ovarian cancer - s/p TLH/BSO in 2017  3. Overactive bladder - saw urogyn this past year.  Nothing really helped that much.  4. Ductal carcinoma in situ (DCIS) of right breast - followed by oncology.  Diagnosis was in 2019.  Finished 5 years of adjuvant therapy.  Released from oncology  5. Diarrhea, unspecified type - have reached out to Dr. Barron Alvine  so see if he has any other recommendations for her.  6. Essential hypertension - pt aware this is elevated.  Has appt next month.  Sent message to Rodman Pickle about sooner appt for pt.  7. Vulvar itching - triamcinolone ointment (KENALOG) 0.5 %; Apply 1 Application topically 2 (two) times daily.  Dispense: 30 g; Refill: 0    Meds: No orders of the defined types were placed in this encounter.   Follow-up: Return in about 1 year (around 12/08/2024).  Jerene Bears, MD 12/09/2023 11:37 AM

## 2023-12-10 ENCOUNTER — Telehealth: Payer: Self-pay

## 2023-12-10 ENCOUNTER — Ambulatory Visit: Admitting: Nurse Practitioner

## 2023-12-10 ENCOUNTER — Encounter: Payer: Self-pay | Admitting: Nurse Practitioner

## 2023-12-10 ENCOUNTER — Other Ambulatory Visit: Payer: Self-pay

## 2023-12-10 VITALS — BP 132/88 | HR 63 | Temp 97.1°F | Ht 62.0 in | Wt 134.6 lb

## 2023-12-10 DIAGNOSIS — H8309 Labyrinthitis, unspecified ear: Secondary | ICD-10-CM

## 2023-12-10 DIAGNOSIS — H538 Other visual disturbances: Secondary | ICD-10-CM | POA: Diagnosis not present

## 2023-12-10 DIAGNOSIS — E876 Hypokalemia: Secondary | ICD-10-CM

## 2023-12-10 DIAGNOSIS — R197 Diarrhea, unspecified: Secondary | ICD-10-CM

## 2023-12-10 DIAGNOSIS — Z1159 Encounter for screening for other viral diseases: Secondary | ICD-10-CM | POA: Diagnosis not present

## 2023-12-10 DIAGNOSIS — I10 Essential (primary) hypertension: Secondary | ICD-10-CM | POA: Diagnosis not present

## 2023-12-10 DIAGNOSIS — E7800 Pure hypercholesterolemia, unspecified: Secondary | ICD-10-CM

## 2023-12-10 LAB — LIPID PANEL
Cholesterol: 180 mg/dL (ref 0–200)
HDL: 78.2 mg/dL (ref >39.00)
LDL Cholesterol: 84 mg/dL (ref 0–99)
NonHDL: 101.88
Total CHOL/HDL Ratio: 2
Triglycerides: 91 mg/dL (ref 0.0–149.0)
VLDL: 18.2 mg/dL (ref 0.0–40.0)

## 2023-12-10 LAB — COMPREHENSIVE METABOLIC PANEL
ALT: 21 U/L (ref 0–35)
AST: 21 U/L (ref 0–37)
Albumin: 4.4 g/dL (ref 3.5–5.2)
Alkaline Phosphatase: 62 U/L (ref 39–117)
BUN: 27 mg/dL — ABNORMAL HIGH (ref 6–23)
CO2: 30 meq/L (ref 19–32)
Calcium: 10.3 mg/dL (ref 8.4–10.5)
Chloride: 103 meq/L (ref 96–112)
Creatinine, Ser: 0.7 mg/dL (ref 0.40–1.20)
GFR: 88.47 mL/min (ref >60.00)
Glucose, Bld: 88 mg/dL (ref 70–99)
Potassium: 3.7 meq/L (ref 3.5–5.1)
Sodium: 141 meq/L (ref 135–145)
Total Bilirubin: 0.6 mg/dL (ref 0.2–1.2)
Total Protein: 6.7 g/dL (ref 6.0–8.3)

## 2023-12-10 LAB — CBC WITH DIFFERENTIAL/PLATELET
Basophils Absolute: 0.1 10*3/uL (ref 0.0–0.1)
Basophils Relative: 0.9 % (ref 0.0–3.0)
Eosinophils Absolute: 0.2 10*3/uL (ref 0.0–0.7)
Eosinophils Relative: 3.3 % (ref 0.0–5.0)
HCT: 38 % (ref 36.0–46.0)
Hemoglobin: 12.9 g/dL (ref 12.0–15.0)
Lymphocytes Relative: 31.9 % (ref 12.0–46.0)
Lymphs Abs: 1.8 10*3/uL (ref 0.7–4.0)
MCHC: 34 g/dL (ref 30.0–36.0)
MCV: 91.8 fl (ref 78.0–100.0)
Monocytes Absolute: 0.5 10*3/uL (ref 0.1–1.0)
Monocytes Relative: 8.6 % (ref 3.0–12.0)
Neutro Abs: 3.2 10*3/uL (ref 1.4–7.7)
Neutrophils Relative %: 55.3 % (ref 43.0–77.0)
Platelets: 233 10*3/uL (ref 150.0–400.0)
RBC: 4.14 Mil/uL (ref 3.87–5.11)
RDW: 13.1 % (ref 11.5–15.5)
WBC: 5.7 10*3/uL (ref 4.0–10.5)

## 2023-12-10 NOTE — Assessment & Plan Note (Signed)
Continue atorvastatin 10 mg daily.  We will check lipid panel and adjust regimen based on results.

## 2023-12-10 NOTE — Patient Instructions (Signed)
 It was great to see you!  We are checking your labs today and will let you know the results via mychart/phone.   Check blood pressure twice a day and write it down. If it stays below 140/90 for the majority of the time, you can cancel the 4/7 appointment   You can try lip flavonoid for balance  Cut back down to 3 metamucil capsules.   Let's follow-up in 6 months, sooner if you have concerns.  If a referral was placed today, you will be contacted for an appointment. Please note that routine referrals can sometimes take up to 3-4 weeks to process. Please call our office if you haven't heard anything after this time frame.  Take care,  Rodman Pickle, NP

## 2023-12-10 NOTE — Assessment & Plan Note (Signed)
 Vertigo and balance issues may be related to vision or vertigo. Consider lipoflavonoid for these symptoms. Continue meclizine  25mg  TID prn. Consider physical therapy maneuvers for vertigo.

## 2023-12-10 NOTE — Assessment & Plan Note (Signed)
 Blood pressure readings were elevated yesterday, but normal today. Monitor blood pressure twice daily and record the readings. Keep the April 7th appointment if blood pressure remains elevated or if the systolic number is over 140; cancel if consistently in the 130s. Check CMP, CBC, lipid panel today. Continue losartan-hctz 100-12.5mg  daily.

## 2023-12-10 NOTE — Progress Notes (Signed)
 Established Patient Office Visit  Subjective   Patient ID: Melissa Mcclure, female    DOB: 11-24-1955  Age: 68 y.o. MRN: [id number]  Chief Complaint  Patient presents with   Hypertension    Follow up, concerns with elevated BP, blurry vision for months    HPI Discussed the use of AI scribe software for clinical note transcription with the patient, who gave verbal consent to proceed.  History of Present Illness   The patient, with a history of hypertension and vertigo, presents with recent high blood pressure readings and blurry vision. The patient reports that the blurry vision has been ongoing for a while and comes and goes. The patient denies any pain in the eyes, chest pain, or shortness of breath. The patient's hypertension is currently managed with medication, but recent readings have been high. The patient denies any changes in diet or new stressors. The patient also mentions a history of glaucoma in the family. She follows with ophthalmology yearly       ROS See pertinent positives and negatives per HPI.    Objective:     BP 132/88 (BP Location: Left Arm, Cuff Size: Normal)   Pulse 63   Temp (!) 97.1 F (36.2 C)   Ht 5\' 2"  (1.575 m)   Wt 134 lb 9.6 oz (61.1 kg)   LMP 06/24/2008   SpO2 99%   BMI 24.62 kg/m    Physical Exam Vitals and nursing note reviewed.  Constitutional:      General: She is not in acute distress.    Appearance: Normal appearance.  HENT:     Head: Normocephalic.  Eyes:     Conjunctiva/sclera: Conjunctivae normal.  Cardiovascular:     Rate and Rhythm: Normal rate and regular rhythm.     Pulses: Normal pulses.     Heart sounds: Normal heart sounds.  Pulmonary:     Effort: Pulmonary effort is normal.     Breath sounds: Normal breath sounds.  Musculoskeletal:     Cervical back: Normal range of motion.  Skin:    General: Skin is warm.  Neurological:     General: No focal deficit present.     Mental Status: She is alert and oriented to  person, place, and time.  Psychiatric:        Mood and Affect: Mood normal.        Behavior: Behavior normal.        Thought Content: Thought content normal.        Judgment: Judgment normal.    The 10-year ASCVD risk score (Arnett DK, et al., 2019) is: 10.7%    Assessment & Plan:   Problem List Items Addressed This Visit       Cardiovascular and Mediastinum   Essential hypertension - Primary   Blood pressure readings were elevated yesterday, but normal today. Monitor blood pressure twice daily and record the readings. Keep the April 7th appointment if blood pressure remains elevated or if the systolic number is over 140; cancel if consistently in the 130s. Check CMP, CBC, lipid panel today. Continue losartan-hctz 100-12.5mg  daily.       Relevant Orders   CBC with Differential/Platelet   Comprehensive metabolic panel   Lipid panel     Nervous and Auditory   Labyrinthitis   Vertigo and balance issues may be related to vision or vertigo. Consider lipoflavonoid for these symptoms. Continue meclizine  25mg  TID prn. Consider physical therapy maneuvers for vertigo.  Other   Elevated LDL cholesterol level   Continue atorvastatin 10 mg daily.  We will check lipid panel and adjust regimen based on results.      Relevant Orders   CBC with Differential/Platelet   Comprehensive metabolic panel   Lipid panel   Hypokalemia   Continue potassium 10 mEq daily. Check CMP and adjust regimen based on results.       Other Visit Diagnoses       Blurry vision       She experiences intermittent blurry vision, possibly related to blood pressure fluctuations. Eye exams are current. Keep eye appointment in August.     Encounter for hepatitis C screening test for low risk patient       Screen hepatitis  C today   Relevant Orders   Hepatitis C antibody       Return in about 6 months (around 06/11/2024) for CPE.    [name], NP

## 2023-12-10 NOTE — Telephone Encounter (Signed)
 Patient advised that referral has been placed for Allergy Clinic to further evaluate potential food allergies/triggers causing her GI symptoms.  Patient advised to contact our office if she has not heard from the allergy clinic in 1 to 2 weeks.   Patient prefers to be seen by Dr Barron Alvine to further discuss ongoing dysphagia and diarrhea issues.  Patient aware that she was scheduled to see Dr Barron Alvine in clinic on 12-11-23 at 10:00am.  Patient agreed to plan and verbalized understanding.  No further questions.

## 2023-12-10 NOTE — Telephone Encounter (Signed)
-----   Message from Shellia Cleverly sent at 12/09/2023 12:20 PM EDT ----- Regarding: FW: mutual pt Can you please place a referral for this patient to the Allergy Clinic to evaluate for potential food triggers/allergies as a cause for her GI symptoms.  Can you also schedule follow-up appoint with me or Deanna/Amanda for ongoing evaluation/treatment of her dysphagia and diarrhea.  Thanks. ----- Message ----- From: Jerene Bears, MD Sent: 12/09/2023  11:24 AM EDT To: Shellia Cleverly, DO Subject: mutual pt                                      Vito, Hey there!!  I saw this pt today.  She is still having her issues with certain food causing her a lot of diarrhea that she sometimes cannot control.  She has figured out chick fila milk shakes are an absolute no no.  She also continues to feel like food gets stuck.  I reviewed her esophageal manometry and it looks normal (to me).    Do you ever have pts go for allergy testing or food sensitivity testing?  Seems like there are certain foods that are triggers for her.   She called once after the procedure to schedule follow up and was told she would get a call back to schedule follow up.  So, she didn't want to be a bother and call back.  Thanks for any input.  Leda Quail

## 2023-12-10 NOTE — Assessment & Plan Note (Signed)
 Continue potassium 10 mEq daily. Check CMP and adjust regimen based on results.

## 2023-12-11 ENCOUNTER — Encounter: Payer: Self-pay | Admitting: Gastroenterology

## 2023-12-11 ENCOUNTER — Ambulatory Visit: Admitting: Gastroenterology

## 2023-12-11 ENCOUNTER — Encounter: Payer: Self-pay | Admitting: Nurse Practitioner

## 2023-12-11 VITALS — BP 134/88 | HR 64 | Ht 62.0 in | Wt 134.4 lb

## 2023-12-11 DIAGNOSIS — K580 Irritable bowel syndrome with diarrhea: Secondary | ICD-10-CM

## 2023-12-11 DIAGNOSIS — R152 Fecal urgency: Secondary | ICD-10-CM | POA: Diagnosis not present

## 2023-12-11 DIAGNOSIS — Z800 Family history of malignant neoplasm of digestive organs: Secondary | ICD-10-CM

## 2023-12-11 DIAGNOSIS — R1310 Dysphagia, unspecified: Secondary | ICD-10-CM

## 2023-12-11 DIAGNOSIS — R159 Full incontinence of feces: Secondary | ICD-10-CM

## 2023-12-11 DIAGNOSIS — R194 Change in bowel habit: Secondary | ICD-10-CM | POA: Diagnosis not present

## 2023-12-11 LAB — HEPATITIS C ANTIBODY: Hepatitis C Ab: NONREACTIVE

## 2023-12-11 MED ORDER — VIBERZI 100 MG PO TABS: 100.0000 mg | ORAL_TABLET | Freq: Two times a day (BID) | ORAL | 3 refills | Status: AC

## 2023-12-11 NOTE — Progress Notes (Unsigned)
 Chief Complaint:    Dysphagia, diarrhea  GI History: 68 year old female with medical history as outlined below, follows in GI clinic for the following:  1) GERD.  Prior longstanding history of GERD.  Had been on Protonix for years prior to surgery. Underwent robotic hiatal hernia repair and toupee fundoplication by Dr. Derrell Lolling in 03/2021.  UGI series in 12/2020 for preoperative evaluation was notable for large fixed hiatal hernia, normal gastric emptying, mild reflux, severe esophageal dysmotility with significant weakening of primary peristalsis, suggestive of chronic reflux related dysmotility.  Now with occasional heartburn once/month.  No longer requires PPI.  2) Dysphagia.  Intermittent food dysphagia.  Symptoms have been present prior to surgery as well, pointing to suprasternal notch and upper sternum.  Worse with rice, chicken. Sxs actually improved after surgery.  - 05/2023: EGD: Intact fundoplication, otherwise normal - 09/2023: Esophageal manometry: Normal  3) Diarrhea.  Postprandial diarrhea for many years.Tends to be worse with sweet foods. Sxs occur a few days/month. Takes Metamucil daily.  Will use Imodium when traveling with good effect.   Mother with colon cancer. Daughter with "thousands of polyps" and had a total colectomy with J pouch.      Endoscopic History: - 08/2014: Colonoscopy (Dr. Ewing Schlein): Internal/external hemorrhoids, sigmoid diverticulosis, large amount of stool with incomplete clearance and poor visualization.  Repeat in 3 years - 05/2018: Colonoscopy (Dr. Ewing Schlein): Internal/external hemorrhoids, sigmoid diverticulosis, otherwise normal.  Repeat 5 years due to family history. - 06/19/2023: EGD: Normal esophagus, intact toupee fundoplication, normal stomach and duodenum - 06/19/2023: Colonoscopy: Left-sided diverticulosis, otherwise normal colon.  Biopsies negative for microscopic colitis.  Significant looping.  Small grade 1 internal hemorrhoids.  Repeat 5 years   HPI:      Patient is a 68 y.o. female presenting to the Gastroenterology Clinic for follow-up.  Was initially seen by me on 04/29/2023 for evaluation of intermittent solid food dysphagia and diarrhea, along with family history of colon cancer as outlined above.  Recommended low FODMAP diet, diet diary.  Having intermittent solid food dysphagia. Sxs occur a few times/week. Worse with chicken, rice. No food impactions. Sxs last a few minutes, resolve, then can finish meal.   Separately, postprandial diarrhea.  Worse with [employer] milkshakes which she now avoids. Still using Metamucil and probiotic. Does have increased gas.    Reviewed labs from yesterday: Normal CMP, CBC   Review of systems:     No chest pain, no SOB, no fevers, no urinary sx   Past Medical History:  Diagnosis Date   Arthritis    Cancer (HCC) 04/2010   melanoma-left knee, right arm   Chronic back pain    Complication of anesthesia    Ductal carcinoma in situ (DCIS) of right breast 10/2017   Family history of adverse reaction to anesthesia    mother has PONV   Family history of colon cancer    Family history of melanoma    Family history of ovarian cancer    Genetic testing 12/23/2017   STAT Breast panel with reflex to Multi-Cancer panel (83 genes) @ [facility] - No pathogenic mutations detected   GERD (gastroesophageal reflux disease)    H/O hiatal hernia    Heart murmur    no problems   History of hand surgery    Hyperlipidemia    Hypertension    Left rotator cuff tear    Malignant melanoma of lower leg (HCC) 03/10/2015   Melanoma of right upper arm (HCC) 10/12/2013   PONV (postoperative  nausea and vomiting)    "zofran works great   Seasonal allergies    Vertigo     Patient's surgical history, family medical history, social history, medications and allergies were all reviewed in Epic    Current Outpatient Medications  Medication Sig Dispense Refill   Ascorbic Acid (VITAMIN C WITH ROSE HIPS) 1000 MG tablet  Take 1,000 mg by mouth daily.     atorvastatin (LIPITOR) 10 MG tablet TAKE 1 TABLET BY MOUTH EVERY DAY 90 tablet 1   cetirizine (ZYRTEC) 10 MG tablet Take 10 mg by mouth daily.     diclofenac (CATAFLAM) 50 MG tablet Take 50 mg by mouth 3 (three) times daily.     losartan-hydrochlorothiazide (HYZAAR) 100-12.5 MG tablet TAKE 1 TABLET BY MOUTH DAILY. REPORTED ON 01/23/2016 90 tablet 0   meclizine (ANTIVERT) 25 MG tablet Take 1 tablet (25 mg total) by mouth 3 (three) times daily as needed (vertigo). Reported on 01/23/2016 30 tablet 1   Multiple Vitamins-Minerals (ALIVE WOMENS 50+) TABS Take 1 tablet by mouth daily.     pregabalin (LYRICA) 50 MG capsule Take 50 mg by mouth 3 (three) times daily.     psyllium (REGULOID) 0.52 g capsule Take 0.52 g by mouth daily.     triamcinolone ointment (KENALOG) 0.5 % Apply 1 Application topically 2 (two) times daily. 30 g 0   potassium chloride (KLOR-CON M) 10 MEQ tablet Take 1 tablet (10 mEq total) by mouth daily. (Patient not taking: Reported on 12/11/2023) 90 tablet 3   No current facility-administered medications for this visit.    Physical Exam:     BP 134/88   Pulse 64   Ht 5\' 2"  (1.575 m)   Wt 134 lb 6.4 oz (61 kg)   LMP 06/24/2008   BMI 24.58 kg/m   GENERAL:  Pleasant *** in NAD PSYCH: : Cooperative, normal affect EENT:  conjunctiva pink, mucous membranes moist, neck supple without masses CARDIAC:  RRR, ***murmur heard, no peripheral edema PULM: Normal respiratory effort, lungs CTA bilaterally, no wheezing ABDOMEN:  Nondistended, soft, nontender. No obvious masses, no hepatomegaly,  normal bowel sounds SKIN:  turgor, no lesions seen Musculoskeletal:  Normal muscle tone, normal strength NEURO: Alert and oriented x 3, no focal neurologic deficits   IMPRESSION and PLAN:    1)    - SIBO testing - Change Metamucil to Benefiber - Low FODMAP diet - Viberzi 100 mg PO BID - ARM for leakage  - Esophagram if sxs worsen. Otherwise conservative  measures   RTC in 4-6 months         Shellia Cleverly ,DO, FACG 12/11/2023, 10:33 AM

## 2023-12-11 NOTE — Patient Instructions (Addendum)
 _______________________________________________________  If your blood pressure at your visit was 140/90 or greater, please contact your primary care physician to follow up on this.  _______________________________________________________  If you are age 69 or older, your body mass index should be between 23-30. Your Body mass index is 24.58 kg/m. If this is out of the aforementioned range listed, please consider follow up with your Primary Care Provider.  If you are age 14 or younger, your body mass index should be between 19-25. Your Body mass index is 24.58 kg/m. If this is out of the aformentioned range listed, please consider follow up with your Primary Care Provider.   ________________________________________________________  The [facility] providers would like to encourage you to use [facility] to communicate with providers for non-urgent requests or questions.  Due to long hold times on the telephone, sending your provider a message by [facility] may be a faster and more efficient way to get a response.  Please allow 48 business hours for a response.  Please remember that this is for non-urgent requests.  _______________________________________________________  Melissa Mcclure have been scheduled to have an anorectal manometry at [facility] on 05-06-24 at 12:30pm. Please arrive 30 minutes prior to your appointment time for registration (1st floor of the hospital-admissions).  Please make certain to use 1 Fleets enema 2 hours prior to coming for your appointment. You can purchase Fleets enemas from the laxative section at your drug store. You should not eat anything during the two hours prior to the procedure. You may take regular medications with small sips of water at least 2 hours prior to the study.  Anorectal manometry is a test performed to evaluate patients with constipation or fecal incontinence. This test measures the pressures of the anal sphincter muscles, the sensation in the rectum,  and the neural reflexes that are needed for normal bowel movements.  THE PROCEDURE The test takes approximately 30 minutes to 1 hour. You will be asked to change into a hospital gown. A technician or nurse will explain the procedure to you, take a brief health history, and answer any questions you may have. The patient then lies on his or her left side. A small, flexible tube, about the size of a thermometer, with a balloon at the end is inserted into the rectum. The catheter is connected to a machine that measures the pressure. During the test, the small balloon attached to the catheter may be inflated in the rectum to assess the normal reflex pathways. The nurse or technician may also ask the person to squeeze, relax, and push at various times. The anal sphincter muscle pressures are measured during each of these maneuvers. To squeeze, the patient tightens the sphincter muscles as if trying to prevent anything from coming out. To push or bear down, the patient strains down as if trying to have a bowel movement.   You have been given a testing kit to check for small intestine bacterial overgrowth (SIBO) which is completed by a company named [facility]. Make sure to return your test in the mail using the return mailing label given to you along with the kit. The test order, your demographic and insurance information have all already been sent to the company. [facility] will collect an upfront charge of $99.74 for commercial insurance plans and $209.74 if you are paying cash. Make sure to discuss with [facility] PRIOR to having the test to see if they have gotten information from your insurance company as to how much your testing will cost  out of pocket, if any. Please contact [facility] at phone number [phone] to get instructions regarding how to perform the test as our office is unable to give specific testing instructions.  We have sent the following medications to your pharmacy  for you to pick up at your convenience:  START: Viberzi 100 mg one tablet two times daily  Please purchase the following medications over the counter and take as directed:  CHANGE Metamucil to Benefiber  Low-FODMAP Eating Plan  FODMAP stands for fermentable oligosaccharides, disaccharides, monosaccharides, and polyols. These are sugars that are hard for some people to digest. A low-FODMAP eating plan may help some people who have irritable bowel syndrome (IBS) and certain other bowel (intestinal) diseases to manage their symptoms. This meal plan can be complicated to follow. Work with a diet and nutrition specialist (dietitian) to make a low-FODMAP eating plan that is right for you. A dietitian can help make sure that you get enough nutrition from this diet. What are tips for following this plan? Reading food labels Check labels for hidden FODMAPs such as: High-fructose syrup. Honey. Agave. Natural fruit flavors. Onion or garlic powder. Choose low-FODMAP foods that contain 3-4 grams of fiber per serving. Check food labels for serving sizes. Eat only one serving at a time to make sure FODMAP levels stay low. Shopping Shop with a list of foods that are recommended on this diet and make a meal plan. Meal planning Follow a low-FODMAP eating plan for up to 6 weeks, or as told by your health care provider or dietitian. To follow the eating plan: Eliminate high-FODMAP foods from your diet completely. Choose only low-FODMAP foods to eat. You will do this for 2-6 weeks. Gradually reintroduce high-FODMAP foods into your diet one at a time. Most people should wait a few days before introducing the next new high-FODMAP food into their meal plan. Your dietitian can recommend how quickly you may reintroduce foods. Keep a daily record of what and how much you eat and drink. Make note of any symptoms that you have after eating. Review your daily record with a dietitian regularly to identify which foods  you can eat and which foods you should avoid. General tips Drink enough fluid each day to keep your urine pale yellow. Avoid processed foods. These often have added sugar and may be high in FODMAPs. Avoid most dairy products, whole grains, and sweeteners. Work with a dietitian to make sure you get enough fiber in your diet. Avoid high FODMAP foods at meals to manage symptoms. Recommended foods Fruits Bananas, oranges, tangerines, lemons, limes, blueberries, raspberries, strawberries, grapes, cantaloupe, honeydew melon, kiwi, papaya, passion fruit, and pineapple. Limited amounts of dried cranberries, banana chips, and shredded coconut. Vegetables Eggplant, zucchini, cucumber, peppers, green beans, bean sprouts, lettuce, arugula, kale, Swiss chard, spinach, collard greens, bok choy, summer squash, potato, and tomato. Limited amounts of corn, carrot, and sweet potato. Green parts of scallions. Grains Gluten-free grains, such as rice, oats, buckwheat, quinoa, corn, polenta, and millet. Gluten-free pasta, bread, or cereal. Rice noodles. Corn tortillas. Meats and other proteins Unseasoned beef, pork, poultry, or fish. Eggs. Tomasa Blase. Tofu (firm) and tempeh. Limited amounts of nuts and seeds, such as almonds, walnuts, [place] nuts, pecans, peanuts, nut butters, pumpkin seeds, chia seeds, and sunflower seeds. Dairy Lactose-free milk, yogurt, and kefir. Lactose-free cottage cheese and ice cream. Non-dairy milks, such as almond, coconut, hemp, and rice milk. Non-dairy yogurt. Limited amounts of goat cheese, brie, mozzarella, parmesan, swiss, and other hard cheeses. Fats  and oils Butter-free spreads. Vegetable oils, such as olive, canola, and sunflower oil. Seasoning and other foods Artificial sweeteners with names that do not end in "ol," such as aspartame, saccharine, and stevia. Maple syrup, white table sugar, raw sugar, brown sugar, and molasses. Mayonnaise, soy sauce, and tamari. Fresh basil, coriander,  parsley, rosemary, and thyme. Beverages Water and mineral water. Sugar-sweetened soft drinks. Small amounts of orange juice or cranberry juice. Black and green tea. Most dry wines. Coffee. The items listed above may not be a complete list of foods and beverages you can eat. Contact a dietitian for more information. Foods to avoid Fruits Fresh, dried, and juiced forms of apple, pear, watermelon, peach, plum, cherries, apricots, blackberries, boysenberries, figs, nectarines, and mango. Avocado. Vegetables Chicory root, artichoke, asparagus, cabbage, snow peas, Brussels sprouts, broccoli, sugar snap peas, mushrooms, celery, and cauliflower. Onions, garlic, leeks, and the white part of scallions. Grains Wheat, including kamut, durum, and semolina. Barley and bulgur. Couscous. Wheat-based cereals. Wheat noodles, bread, crackers, and pastries. Meats and other proteins Fried or fatty meat. Sausage. Cashews and pistachios. Soybeans, baked beans, black beans, chickpeas, kidney beans, fava beans, navy beans, lentils, black-eyed peas, and split peas. Dairy Milk, yogurt, ice cream, and soft cheese. Cream and sour cream. Milk-based sauces. Custard. Buttermilk. Soy milk. Seasoning and other foods Any sugar-free gum or candy. Foods that contain artificial sweeteners such as sorbitol, mannitol, isomalt, or xylitol. Foods that contain honey, high-fructose corn syrup, or agave. Bouillon, vegetable stock, beef stock, and chicken stock. Garlic and onion powder. Condiments made with onion, such as hummus, chutney, pickles, relish, salad dressing, and salsa. Tomato paste. Beverages Chicory-based drinks. Coffee substitutes. Chamomile tea. Fennel tea. Sweet or fortified wines such as port or sherry. Diet soft drinks made with isomalt, mannitol, maltitol, sorbitol, or xylitol. Apple, pear, and mango juice. Juices with high-fructose corn syrup. The items listed above may not be a complete list of foods and beverages you  should avoid. Contact a dietitian for more information. Summary FODMAP stands for fermentable oligosaccharides, disaccharides, monosaccharides, and polyols. These are sugars that are hard for some people to digest. A low-FODMAP eating plan is a short-term diet that helps to ease symptoms of certain bowel diseases. The eating plan usually lasts up to 6 weeks. After that, high-FODMAP foods are reintroduced gradually and one at a time. This can help you find out which foods may be causing symptoms. A low-FODMAP eating plan can be complicated. It is best to work with a dietitian who has experience with this type of plan. This information is not intended to replace advice given to you by your health care provider. Make sure you discuss any questions you have with your health care provider. Document Revised: 01/28/2020 Document Reviewed: 01/28/2020 Elsevier Patient Education  2024 [employer].  Due to recent changes in healthcare laws, you may see the results of your imaging and laboratory studies on MyChart before your provider has had a chance to review them.  We understand that in some cases there may be results that are confusing or concerning to you. Not all laboratory results come back in the same time frame and the provider may be waiting for multiple results in order to interpret others.  Please give [place] 48 hours in order for your provider to thoroughly review all the results before contacting the office for clarification of your results.   It was a pleasure to see you today!  Vito Cirigliano, D.O.

## 2023-12-12 ENCOUNTER — Telehealth: Payer: Self-pay | Admitting: Gastroenterology

## 2023-12-12 NOTE — Telephone Encounter (Signed)
 Patient called would like to know how many times she is too take benefiber.

## 2023-12-12 NOTE — Telephone Encounter (Signed)
 Pt  questioned what dosage of Benefiber she should be taking. Chart was reviewed.  Pt was recommended to take the dosage that stated on the package and titrate as needed per her BM's. Pt verbalized understanding with all questions answered.

## 2023-12-18 ENCOUNTER — Telehealth: Payer: Self-pay | Admitting: Gastroenterology

## 2023-12-18 NOTE — Telephone Encounter (Signed)
 Does patient have to hold Viberzi to collect SIBO breath test?

## 2023-12-18 NOTE — Telephone Encounter (Signed)
 Patient called and stated that she would like to speak to the nurse regarding her [facility] test for the SIBO. Patient would like to know if she needs to stop taking her medication in order to due the test. Patient is requesting a call back. Please advise.

## 2023-12-19 NOTE — Telephone Encounter (Signed)
MyChart message sent to patient with recommendation.

## 2023-12-23 ENCOUNTER — Encounter (HOSPITAL_BASED_OUTPATIENT_CLINIC_OR_DEPARTMENT_OTHER): Payer: Self-pay | Admitting: Obstetrics & Gynecology

## 2023-12-30 ENCOUNTER — Ambulatory Visit: Admitting: Nurse Practitioner

## 2024-01-01 ENCOUNTER — Ambulatory Visit: Admitting: Internal Medicine

## 2024-01-06 DIAGNOSIS — M546 Pain in thoracic spine: Secondary | ICD-10-CM | POA: Diagnosis not present

## 2024-01-06 DIAGNOSIS — M5416 Radiculopathy, lumbar region: Secondary | ICD-10-CM | POA: Diagnosis not present

## 2024-01-19 ENCOUNTER — Other Ambulatory Visit: Payer: Self-pay | Admitting: Nurse Practitioner

## 2024-01-19 DIAGNOSIS — E7800 Pure hypercholesterolemia, unspecified: Secondary | ICD-10-CM

## 2024-01-26 ENCOUNTER — Other Ambulatory Visit: Payer: Self-pay | Admitting: Nurse Practitioner

## 2024-01-26 DIAGNOSIS — I10 Essential (primary) hypertension: Secondary | ICD-10-CM

## 2024-01-27 NOTE — Telephone Encounter (Signed)
 Requesting: LOSARTAN -HCTZ 100-12.5 MG TAB  Last Visit: 12/10/2023 Next Visit: 06/03/2024 Last Refill: 09/10/2023  Please Advise

## 2024-02-05 DIAGNOSIS — K580 Irritable bowel syndrome with diarrhea: Secondary | ICD-10-CM | POA: Diagnosis not present

## 2024-02-06 DIAGNOSIS — C44729 Squamous cell carcinoma of skin of left lower limb, including hip: Secondary | ICD-10-CM | POA: Diagnosis not present

## 2024-02-06 DIAGNOSIS — L820 Inflamed seborrheic keratosis: Secondary | ICD-10-CM | POA: Diagnosis not present

## 2024-02-10 ENCOUNTER — Telehealth: Payer: Self-pay

## 2024-02-10 MED ORDER — RIFAXIMIN 550 MG PO TABS: 550.0000 mg | ORAL_TABLET | Freq: Three times a day (TID) | ORAL | 0 refills | Status: AC

## 2024-02-10 MED ORDER — NEOMYCIN SULFATE 500 MG PO TABS: 500.0000 mg | ORAL_TABLET | Freq: Two times a day (BID) | ORAL | 1 refills | Status: AC

## 2024-02-10 NOTE — Telephone Encounter (Signed)
 Received fax from [facility] with results of SIBO breath test.  Results placed in Dr Andre Kawasaki inbox for his review.

## 2024-02-10 NOTE — Telephone Encounter (Signed)
 Phone call made to patient to make her aware of results per Dr Karene Oto as instructed below:  Results from the [facility] study were reviewed and notable for elevated hydrogen (20 ppm), methane (26 ppm), and elevated combined hydrogen/methane (46 ppm with ULN <15 PPM).  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 3 times daily x 14 days, RF 1 - Neomycin  500 mg p.o. twice daily x 14 days, RF 1 - Start probiotic, such as align, and continue for 4 weeks beyond completion of antibiotic course   Medications sent to pharmacy as patient requested.  Instructions also sent to patient in MyChart.  Patient agreed to plan and verbalized understanding.  No further questions.

## 2024-03-09 ENCOUNTER — Other Ambulatory Visit: Payer: Self-pay | Admitting: Nurse Practitioner

## 2024-03-09 DIAGNOSIS — H8309 Labyrinthitis, unspecified ear: Secondary | ICD-10-CM

## 2024-03-10 NOTE — Telephone Encounter (Signed)
 Requesting: MECLIZINE  25 MG TABLET  Last Visit: 12/10/2023 Next Visit: 06/03/2024 Last Refill: 11/23/2022  Please Advise

## 2024-04-18 ENCOUNTER — Other Ambulatory Visit (HOSPITAL_BASED_OUTPATIENT_CLINIC_OR_DEPARTMENT_OTHER): Payer: Self-pay | Admitting: Obstetrics & Gynecology

## 2024-04-18 DIAGNOSIS — L292 Pruritus vulvae: Secondary | ICD-10-CM

## 2024-05-06 ENCOUNTER — Encounter (HOSPITAL_COMMUNITY)
Admission: RE | Disposition: A | Discharge status: Home / Self Care | Payer: Self-pay | Source: Home / Self Care | Attending: Gastroenterology

## 2024-05-06 ENCOUNTER — Ambulatory Visit (HOSPITAL_COMMUNITY)
Admission: RE | Admit: 2024-05-06 | Discharge: 2024-05-06 | Disposition: A | Discharge status: Home / Self Care | Attending: Gastroenterology | Admitting: Gastroenterology

## 2024-05-06 ENCOUNTER — Encounter (HOSPITAL_COMMUNITY): Payer: Self-pay | Admitting: Gastroenterology

## 2024-05-06 DIAGNOSIS — R152 Fecal urgency: Secondary | ICD-10-CM | POA: Insufficient documentation

## 2024-05-06 DIAGNOSIS — R159 Full incontinence of feces: Secondary | ICD-10-CM | POA: Diagnosis not present

## 2024-05-06 DIAGNOSIS — K5902 Outlet dysfunction constipation: Secondary | ICD-10-CM

## 2024-05-06 DIAGNOSIS — K6289 Other specified diseases of anus and rectum: Secondary | ICD-10-CM | POA: Diagnosis not present

## 2024-05-06 HISTORY — PX: ANAL RECTAL MANOMETRY: SHX6358

## 2024-05-06 SURGERY — MANOMETRY, ANORECTAL
Anesthesia: Monitor Anesthesia Care

## 2024-05-06 NOTE — Progress Notes (Signed)
 Anal rectal manometry performed per protocol without complications.  Patient tolerated well.  Balloon expulsion test performed per protocol without complications.  Patient tolerated well.  Expelled balloon after 25 seconds.

## 2024-05-07 ENCOUNTER — Encounter (HOSPITAL_COMMUNITY): Payer: Self-pay | Admitting: Gastroenterology

## 2024-05-14 DIAGNOSIS — R1319 Other dysphagia: Secondary | ICD-10-CM

## 2024-05-19 ENCOUNTER — Ambulatory Visit (INDEPENDENT_AMBULATORY_CARE_PROVIDER_SITE_OTHER): Payer: Medicare Other | Admitting: Ophthalmology

## 2024-05-19 ENCOUNTER — Encounter (INDEPENDENT_AMBULATORY_CARE_PROVIDER_SITE_OTHER): Payer: Self-pay | Admitting: Ophthalmology

## 2024-05-19 DIAGNOSIS — I10 Essential (primary) hypertension: Secondary | ICD-10-CM | POA: Diagnosis not present

## 2024-05-19 DIAGNOSIS — H33103 Unspecified retinoschisis, bilateral: Secondary | ICD-10-CM | POA: Diagnosis not present

## 2024-05-19 DIAGNOSIS — H35371 Puckering of macula, right eye: Secondary | ICD-10-CM

## 2024-05-19 DIAGNOSIS — H35033 Hypertensive retinopathy, bilateral: Secondary | ICD-10-CM

## 2024-05-19 DIAGNOSIS — H25813 Combined forms of age-related cataract, bilateral: Secondary | ICD-10-CM

## 2024-05-19 DIAGNOSIS — Z9889 Other specified postprocedural states: Secondary | ICD-10-CM

## 2024-05-19 NOTE — Progress Notes (Signed)
 [facility] - Clinic Note  05/19/2024     CHIEF COMPLAINT Patient presents for Retina Follow Up   HISTORY OF PRESENT ILLNESS: Melissa Mcclure is a 68 y.o. female who presents to the clinic today for:   HPI     Retina Follow Up   In both eyes.  This started 1 year ago.  Duration of 1 year.  Since onset it is stable.  I, the attending physician,  performed the HPI with the patient and updated documentation appropriately.        Comments   1 year retinoschisis OU pt is reporting no vision changes noticed she denies any flashes has some floaters       Last edited by Valdemar Rogue, MD on 05/20/2024  2:38 AM.     Pt states   Referring physician: Nedra Tinnie LABOR, NP [address] [place],  [place] [address]  HISTORICAL INFORMATION:   Selected notes from the MEDICAL RECORD NUMBER Referred by Dr. Fleeta for concern of retinoschisis OS LEE:  Ocular Hx- PMH-    CURRENT MEDICATIONS: No current outpatient medications on file. (Ophthalmic Drugs)   No current facility-administered medications for this visit. (Ophthalmic Drugs)   Current Outpatient Medications (Other)  Medication Sig   Ascorbic Acid  (VITAMIN C  WITH ROSE HIPS) 1000 MG tablet Take 1,000 mg by mouth daily.   atorvastatin  (LIPITOR) 10 MG tablet TAKE 1 TABLET BY MOUTH EVERY DAY   cetirizine (ZYRTEC) 10 MG tablet Take 10 mg by mouth daily.   diclofenac  (CATAFLAM) 50 MG tablet Take 50 mg by mouth 3 (three) times daily.   losartan -hydrochlorothiazide  (HYZAAR ) 100-12.5 MG tablet TAKE 1 TABLET BY MOUTH DAILY. REPORTED ON 01/23/2016   meclizine  (ANTIVERT ) 25 MG tablet TAKE 1 TABLET (25 MG TOTAL) BY MOUTH 3 (THREE) TIMES DAILY AS NEEDED (VERTIGO). REPORTED ON 01/23/2016   Multiple Vitamins-Minerals (ALIVE WOMENS 50+) TABS Take 1 tablet by mouth daily.   potassium chloride  (KLOR-CON  M) 10 MEQ tablet Take 1 tablet (10 mEq total) by mouth daily. (Patient not taking: Reported on 12/11/2023)   pregabalin  (LYRICA) 50 MG capsule Take 50 mg by mouth 3 (three) times daily.   psyllium (REGULOID) 0.52 g capsule Take 0.52 g by mouth daily.   triamcinolone  ointment (KENALOG ) 0.5 % Apply topically twice daily for up to 7 days as needed for vulvar itching.   No current facility-administered medications for this visit. (Other)   REVIEW OF SYSTEMS: ROS   Positive for: Cardiovascular, Eyes Negative for: Constitutional, Gastrointestinal, Neurological, Skin, Genitourinary, Musculoskeletal, HENT, Endocrine, Respiratory, Psychiatric, Allergic/Imm, Heme/Lymph Last edited by Resa Delon ORN, COT on 05/19/2024  1:10 PM.     ALLERGIES Allergies  Allergen Reactions   Elemental Sulfur Hives   PAST MEDICAL HISTORY Past Medical History:  Diagnosis Date   Arthritis    Cancer (HCC) 04/2010   melanoma-left knee, right arm   Chronic back pain    Complication of anesthesia    Ductal carcinoma in situ (DCIS) of right breast 10/2017   Family history of adverse reaction to anesthesia    mother has PONV   Family history of colon cancer    Family history of melanoma    Family history of ovarian cancer    Genetic testing 12/23/2017   STAT Breast panel with reflex to Multi-Cancer panel (83 genes) @ [facility] - No pathogenic mutations detected   GERD (gastroesophageal reflux disease)    H/O hiatal hernia    Heart murmur  no problems   History of hand surgery    Hyperlipidemia    Hypertension    Left rotator cuff tear    Malignant melanoma of lower leg (HCC) 03/10/2015   Melanoma of right upper arm (HCC) 10/12/2013   PONV (postoperative nausea and vomiting)    zofran  works great   Seasonal allergies    Vertigo    Past Surgical History:  Procedure Laterality Date   ANAL RECTAL MANOMETRY N/A 05/06/2024   Procedure: MANOMETRY, ANORECTAL;  Surgeon: San Sandor GAILS, DO;  Location: [facility];  Service: Gastroenterology;  Laterality: N/A;   ANTERIOR LAT LUMBAR FUSION Right 04/17/2013   Procedure:  ANTERIOR LATERAL LUMBAR FUSION 3 LEVELS;  Surgeon: Fairy Levels, MD;  Location: [facility];  Service: Neurosurgery;  Laterality: Right;  Right Lumbar Two-Three Lumbar Three-Four Lumbar Four-Five  Anterolateral Fusion   BACK SURGERY     BREAST LUMPECTOMY WITH RADIOACTIVE SEED LOCALIZATION Right 12/25/2017   Procedure: RIGHT BREAST LUMPECTOMY WITH RADIOACTIVE SEED LOCALIZATION;  Surgeon: Gail Favorite, MD;  Location: [facility];  Service: General;  Laterality: Right;   BREAST SURGERY     CESAREAN SECTION  1978   CESAREAN SECTION  1980   COLONOSCOPY     COLONOSCOPY WITH PROPOFOL  N/A 06/23/2018   Procedure: COLONOSCOPY WITH PROPOFOL ;  Surgeon: Rosalie Kitchens, MD;  Location: [facility];  Service: Endoscopy;  Laterality: N/A;   CYSTOSCOPY N/A 06/26/2016   Procedure: CYSTOSCOPY;  Surgeon: Ronal GORMAN Pinal, MD;  Location: [facility];  Service: Gynecology;  Laterality: N/A;   DILATATION & CURETTAGE/HYSTEROSCOPY WITH MYOSURE N/A 01/24/2016   Procedure: DILATATION & CURETTAGE/HYSTEROSCOPY WITH MYOSURE;  Surgeon: Ronal GORMAN Pinal, MD;  Location: [facility];  Service: Gynecology;  Laterality: N/A;   ESOPHAGEAL MANOMETRY N/A 10/16/2023   Procedure: ESOPHAGEAL MANOMETRY (EM);  Surgeon: San Sandor GAILS, DO;  Location: [facility];  Service: Gastroenterology;  Laterality: N/A;   FOOT SURGERY Right    early 20s-bone spur removal   HAND SURGERY Bilateral    for basal joint arthritis   HERNIA REPAIR     LAPAROSCOPIC HYSTERECTOMY Bilateral 06/26/2016   Procedure: HYSTERECTOMY TOTAL LAPAROSCOPIC;  Surgeon: Ronal GORMAN Pinal, MD;  Location: [facility];  Service: Gynecology;  Laterality: Bilateral;   LUMBAR PERCUTANEOUS PEDICLE SCREW 3 LEVEL N/A 04/17/2013   Procedure: LUMBAR PERCUTANEOUS PEDICLE SCREW 3 LEVEL;  Surgeon: Fairy Levels, MD;  Location: [facility];  Service: Neurosurgery;  Laterality: N/A;  Decompression and Posterior Lumbar Interbody Fusion of Lumbar Five-Sacral One, Percutaneous Screws Lumbar Two  through Sacral One   MELANOMA EXCISION     knee 08 lft. elbow 11rt   SALPINGOOPHORECTOMY Bilateral 06/26/2016   Procedure: SALPINGO OOPHORECTOMY;  Surgeon: Ronal GORMAN Pinal, MD;  Location: [facility];  Service: Gynecology;  Laterality: Bilateral;   SHOULDER SURGERY Right 04/28/2015   SHOULDER SURGERY Left 2023   STRABISMUS SURGERY Bilateral 07/29/2020   Procedure: BILATERAL STRABISMUS REPAIR;  Surgeon: Neysa Fallow, MD;  Location: [facility];  Service: Ophthalmology;  Laterality: Bilateral;   TUBAL LIGATION  1986   [facility]  06/17/2007   and left groin lymph node   XI ROBOTIC ASSISTED HIATAL HERNIA REPAIR N/A 04/04/2021   Procedure: ROBOTIC HIATAL HERNIA REPAIR WITH FUNDOPLICATION;  Surgeon: Rubin Calamity, MD;  Location: [facility];  Service: General;  Laterality: N/A;   FAMILY HISTORY Family History  Problem Relation Age of Onset   Diabetes Father        borderline   Melanoma Father  dx 50s/60s; deceased 4s   Congestive Heart Failure Father    Colon cancer Mother        dx 78s; deceased 23   Hypertension Mother    Osteoporosis Mother    Ovarian cancer Maternal Grandmother        deceased 21s   Heart disease Paternal Grandfather    Melanoma Daughter        on leg; dx 30s   SOCIAL HISTORY Social History   Tobacco Use   Smoking status: Never   Smokeless tobacco: Never  Vaping Use   Vaping status: Never Used  Substance Use Topics   Alcohol use: Yes    Alcohol/week: 0.0 - 1.0 standard drinks of alcohol    Comment: socially   Drug use: No       OPHTHALMIC EXAM:  Base Eye Exam     Visual Acuity (Snellen - Linear)       Right Left   Dist [place] 20/20 -1 20/20 -1         Tonometry (Tonopen, 1:12 PM)       Right Left   Pressure 14 13         Pupils       Pupils Dark Light Shape React APD   Right PERRL 2 1 Round Brisk None   Left PERRL 2 1 Round Brisk None         Visual Fields       Left Right    Full Full         Extraocular Movement        Right Left    Full, Ortho Full, Ortho         Neuro/Psych     Oriented x3: Yes   Mood/Affect: Normal         Dilation     Both eyes: 2.5% Phenylephrine  @ 1:12 PM           Slit Lamp and Fundus Exam     Slit Lamp Exam       Right Left   Lids/Lashes Dermatochalasis - upper lid, mild Ptosis Dermatochalasis - upper lid   Conjunctiva/Sclera nasal pingeucula Mild nasal and temporal pinguecula   Cornea 1+ Punctate epithelial erosions, tear film debris 1+ inferior Punctate epithelial erosions, mild tear film debris   Anterior Chamber deep, clear, narrow temporal angle deep, clear, narrow temporal angle   Iris Round and dilated Round and dilated   Lens 2+ Nuclear sclerosis, 2+ Cortical cataract, mild pigment deposition anterior capsule 2+ Nuclear sclerosis, 2+ Cortical cataract   Anterior Vitreous Vitreous syneresis mild syneresis, Posterior vitreous detachment, vitreous condensations         Fundus Exam       Right Left   Disc Pink and Sharp, +cupping, mild temporal PPA Pink and Sharp, +cupping   C/D Ratio 0.7 0.6   Macula Flat, Good foveal reflex, mild RPE mottling, ERM w/ mild striae superior macula, No edema Flat, Good foveal reflex, mild RPE mottling, No heme or edema   Vessels mild attenuation, mild tortuosity attenuated, mild tortuosity   Periphery Attached, focal CR atrophy at 0900, shallow schisis ST periphery, no RT/RD, No heme Attached, bullous schisis from 0130-0300, no RT/RD           IMAGING AND PROCEDURES  Imaging and Procedures for 05/19/2024  OCT, Retina - OU - Both Eyes       Right Eye Quality was good. Central Foveal Thickness: 293. Progression has worsened. Findings include normal  foveal contour, no IRF, no SRF, epiretinal membrane, macular pucker, preretinal fibrosis (ERM w/ pucker and preretinal fibrosis--increased, Partial PVD, focal multilaminer schisis ST periphery caught on widefield -- not imaged today).   Left Eye Quality was  good. Central Foveal Thickness: 286. Progression has been stable. Findings include normal foveal contour, no IRF, no SRF (Bullous retinoschisis from 0100-0230 caught on widefield -- stable).   Notes *Images captured and stored on drive  Diagnosis / Impression:  NFP, no IRF/SRF OU OD: ERM w/ pucker and preretinal fibrosis--increased, Partial PVD, focal multilaminer schisis ST periphery caught on widefield OS: Bullous retinoschisis from 0100-0230 caught on widefield -- not imaged today  Clinical management:  See below  Abbreviations: NFP - Normal foveal profile. CME - cystoid macular edema. PED - pigment epithelial detachment. IRF - intraretinal fluid. SRF - subretinal fluid. EZ - ellipsoid zone. ERM - epiretinal membrane. ORA - outer retinal atrophy. ORT - outer retinal tubulation. SRHM - subretinal hyper-reflective material. IRHM - intraretinal hyper-reflective material            ASSESSMENT/PLAN:    ICD-10-CM   1. Epiretinal membrane (ERM) of right eye  H35.371 OCT, Retina - OU - Both Eyes    2. Bilateral retinoschisis  H33.103     3. Essential hypertension  I10     4. Hypertensive retinopathy of both eyes  H35.033     5. History of strabismus surgery  Z98.890     6. Combined forms of age-related cataract of both eyes  H25.813      Epiretinal membrane, right eye    - interval development of ERM OD, noted on 08.26.25 exam - The natural history, anatomy, potential for loss of vision, and treatment options including vitrectomy techniques and the complications of endophthalmitis, retinal detachment, vitreous hemorrhage, cataract progression and permanent vision loss discussed with the patient. - OCT OD today shows ERM w/ pucker and preretinal fibrosis--increased, Partial PVD - BCVA OD remains 20/20 - asymptomatic, no metamorphopsia - no indication for surgery at this time - monitor for now - f/u 6 mos -- DFE/OCT  2. Retinoschisis OU  - OD: shallow schisis ST  periphery-stable from prior - OS: bullous schisis located from 0100-0230-not imaged today - both stable without progression; no RT/RD OU - baseline Optos images obtained 08.27.24 - discussed findings, prognosis, treatment options - no retinal or ophthalmic interventions indicated or recommended at this time - recommend monitoring - f/u 97mo, sooner prn - DFE, Optos images, OCT -- **scan through schisis**  3,4. Hypertensive retinopathy OU - discussed importance of tight BP control - monitor  5. History of strabismus surgery  - pt reports history of XT  - s/p strabismus SX Dr. Neysa (11.05.21)  6. Mixed Cataract OU - The symptoms of cataract, surgical options, and treatments and risks were discussed with patient. - discussed diagnosis and progression - under the expert management of Dr. Fleeta  - clear from a retina standpoint to proceed with cataract surgery when both patient and surgeon are ready  Ophthalmic Meds Ordered this visit:  No orders of the defined types were placed in this encounter.    Return in about 6 months (around 11/19/2024) for ERM OD, schisis OU, DFE, OCT, Optos img OU, scan thru schisis OU.  There are no Patient Instructions on file for this visit.   Explained the diagnoses, plan, and follow up with the patient and they expressed understanding.  Patient expressed understanding of the importance of proper follow up care.  This document serves as a record of services personally performed by Redell JUDITHANN Hans, MD, PhD. It was created on their behalf by Auston Muzzy, COMT. The creation of this record is the provider's dictation and/or activities during the visit.  Electronically signed by: Auston Muzzy, COMT 05/20/24 2:39 AM  This document serves as a record of services personally performed by Redell JUDITHANN Hans, MD, PhD. It was created on their behalf by Almetta Pesa, an ophthalmic technician. The creation of this record is the provider's dictation and/or activities  during the visit.    Electronically signed by: Almetta Pesa, OA, 05/20/24  2:39 AM  Redell JUDITHANN Hans, M.D., Ph.D. Diseases & Surgery of the Retina and Vitreous [facility]  I have reviewed the above documentation for accuracy and completeness, and I agree with the above. Redell JUDITHANN Hans, M.D., Ph.D. 05/20/24 2:41 AM   Abbreviations: M myopia (nearsighted); A astigmatism; H hyperopia (farsighted); P presbyopia; Mrx spectacle prescription;  CTL contact lenses; OD right eye; OS left eye; OU both eyes  XT exotropia; ET esotropia; PEK punctate epithelial keratitis; PEE punctate epithelial erosions; DES dry eye syndrome; MGD meibomian gland dysfunction; ATs artificial tears; PFAT's preservative free artificial tears; NSC nuclear sclerotic cataract; PSC posterior subcapsular cataract; ERM epi-retinal membrane; PVD posterior vitreous detachment; RD retinal detachment; DM diabetes mellitus; DR diabetic retinopathy; NPDR non-proliferative diabetic retinopathy; PDR proliferative diabetic retinopathy; CSME clinically significant macular edema; DME diabetic macular edema; dbh dot blot hemorrhages; CWS cotton wool spot; POAG primary open angle glaucoma; C/D cup-to-disc ratio; HVF humphrey visual field; GVF goldmann visual field; OCT optical coherence tomography; IOP intraocular pressure; BRVO Branch retinal vein occlusion; CRVO central retinal vein occlusion; CRAO central retinal artery occlusion; BRAO branch retinal artery occlusion; RT retinal tear; SB scleral buckle; PPV pars plana vitrectomy; VH Vitreous hemorrhage; PRP panretinal laser photocoagulation; IVK intravitreal kenalog ; VMT vitreomacular traction; MH Macular hole;  NVD neovascularization of the disc; NVE neovascularization elsewhere; AREDS age related eye disease study; ARMD age related macular degeneration; POAG primary open angle glaucoma; EBMD epithelial/anterior basement membrane dystrophy; ACIOL anterior chamber intraocular  lens; IOL intraocular lens; PCIOL posterior chamber intraocular lens; Phaco/IOL phacoemulsification with intraocular lens placement; PRK photorefractive keratectomy; LASIK laser assisted in situ keratomileusis; HTN hypertension; DM diabetes mellitus; COPD chronic obstructive pulmonary disease

## 2024-05-20 ENCOUNTER — Encounter (INDEPENDENT_AMBULATORY_CARE_PROVIDER_SITE_OTHER): Payer: Self-pay | Admitting: Ophthalmology

## 2024-06-02 ENCOUNTER — Encounter (HOSPITAL_BASED_OUTPATIENT_CLINIC_OR_DEPARTMENT_OTHER): Payer: Self-pay | Admitting: Obstetrics & Gynecology

## 2024-06-02 DIAGNOSIS — Z1231 Encounter for screening mammogram for malignant neoplasm of breast: Secondary | ICD-10-CM | POA: Diagnosis not present

## 2024-06-02 LAB — HM MAMMOGRAPHY

## 2024-06-03 ENCOUNTER — Encounter: Payer: Self-pay | Admitting: Nurse Practitioner

## 2024-06-03 ENCOUNTER — Ambulatory Visit: Payer: Self-pay | Admitting: Nurse Practitioner

## 2024-06-03 ENCOUNTER — Ambulatory Visit: Admitting: Nurse Practitioner

## 2024-06-03 VITALS — BP 134/80 | HR 57 | Temp 97.3°F | Ht 62.0 in | Wt 133.2 lb

## 2024-06-03 DIAGNOSIS — G8929 Other chronic pain: Secondary | ICD-10-CM

## 2024-06-03 DIAGNOSIS — E876 Hypokalemia: Secondary | ICD-10-CM

## 2024-06-03 DIAGNOSIS — I10 Essential (primary) hypertension: Secondary | ICD-10-CM

## 2024-06-03 DIAGNOSIS — N951 Menopausal and female climacteric states: Secondary | ICD-10-CM | POA: Diagnosis not present

## 2024-06-03 DIAGNOSIS — Z0000 Encounter for general adult medical examination without abnormal findings: Secondary | ICD-10-CM

## 2024-06-03 DIAGNOSIS — H8309 Labyrinthitis, unspecified ear: Secondary | ICD-10-CM

## 2024-06-03 DIAGNOSIS — M545 Low back pain, unspecified: Secondary | ICD-10-CM | POA: Diagnosis not present

## 2024-06-03 DIAGNOSIS — K219 Gastro-esophageal reflux disease without esophagitis: Secondary | ICD-10-CM

## 2024-06-03 DIAGNOSIS — Z86000 Personal history of in-situ neoplasm of breast: Secondary | ICD-10-CM

## 2024-06-03 DIAGNOSIS — E7800 Pure hypercholesterolemia, unspecified: Secondary | ICD-10-CM

## 2024-06-03 DIAGNOSIS — R152 Fecal urgency: Secondary | ICD-10-CM

## 2024-06-03 LAB — CBC WITH DIFFERENTIAL/PLATELET
Basophils Absolute: 0 K/uL (ref 0.0–0.1)
Basophils Relative: 1 % (ref 0.0–3.0)
Eosinophils Absolute: 0.2 K/uL (ref 0.0–0.7)
Eosinophils Relative: 4.1 % (ref 0.0–5.0)
HCT: 40 % (ref 36.0–46.0)
Hemoglobin: 13.6 g/dL (ref 12.0–15.0)
Lymphocytes Relative: 37.2 % (ref 12.0–46.0)
Lymphs Abs: 1.7 K/uL (ref 0.7–4.0)
MCHC: 34.1 g/dL (ref 30.0–36.0)
MCV: 91.1 fl (ref 78.0–100.0)
Monocytes Absolute: 0.4 K/uL (ref 0.1–1.0)
Monocytes Relative: 8.9 % (ref 3.0–12.0)
Neutro Abs: 2.2 K/uL (ref 1.4–7.7)
Neutrophils Relative %: 48.8 % (ref 43.0–77.0)
Platelets: 226 K/uL (ref 150.0–400.0)
RBC: 4.39 Mil/uL (ref 3.87–5.11)
RDW: 12.9 % (ref 11.5–15.5)
WBC: 4.5 K/uL (ref 4.0–10.5)

## 2024-06-03 LAB — POCT URINALYSIS DIPSTICK
Bilirubin, UA: NEGATIVE
Blood, UA: NEGATIVE
Glucose, UA: NEGATIVE
Ketones, UA: NEGATIVE
Leukocytes, UA: NEGATIVE
Nitrite, UA: NEGATIVE
Protein, UA: NEGATIVE
Spec Grav, UA: 1.015 (ref 1.010–1.025)
Urobilinogen, UA: 0.2 U/dL
pH, UA: 7 (ref 5.0–8.0)

## 2024-06-03 LAB — COMPREHENSIVE METABOLIC PANEL WITH GFR
ALT: 25 U/L (ref 0–35)
AST: 26 U/L (ref 0–37)
Albumin: 4.6 g/dL (ref 3.5–5.2)
Alkaline Phosphatase: 58 U/L (ref 39–117)
BUN: 25 mg/dL — ABNORMAL HIGH (ref 6–23)
CO2: 33 meq/L — ABNORMAL HIGH (ref 19–32)
Calcium: 10.5 mg/dL (ref 8.4–10.5)
Chloride: 101 meq/L (ref 96–112)
Creatinine, Ser: 0.86 mg/dL (ref 0.40–1.20)
GFR: 68.87 mL/min (ref >60.00)
Glucose, Bld: 91 mg/dL (ref 70–99)
Potassium: 3.5 meq/L (ref 3.5–5.1)
Sodium: 142 meq/L (ref 135–145)
Total Bilirubin: 0.7 mg/dL (ref 0.2–1.2)
Total Protein: 7.3 g/dL (ref 6.0–8.3)

## 2024-06-03 LAB — LIPID PANEL
Cholesterol: 179 mg/dL (ref 0–200)
HDL: 78.8 mg/dL (ref >39.00)
LDL Cholesterol: 84 mg/dL (ref 0–99)
NonHDL: 100.62
Total CHOL/HDL Ratio: 2
Triglycerides: 83 mg/dL (ref 0.0–149.0)
VLDL: 16.6 mg/dL (ref 0.0–40.0)

## 2024-06-03 MED ORDER — ATORVASTATIN CALCIUM 10 MG PO TABS: 10.0000 mg | ORAL_TABLET | Freq: Every day | ORAL | 1 refills | Status: AC

## 2024-06-03 MED ORDER — LOSARTAN POTASSIUM-HCTZ 100-12.5 MG PO TABS: 1.0000 | ORAL_TABLET | Freq: Every day | ORAL | 1 refills | Status: AC

## 2024-06-03 NOTE — Patient Instructions (Signed)
 It was great to see you!  We are checking your labs today and will let you know the results via mychart/phone.    Reach out to your back doctor to schedule an appointment   Let's follow-up in 6 months, sooner if you have concerns.  If a referral was placed today, you will be contacted for an appointment. Please note that routine referrals can sometimes take up to 3-4 weeks to process. Please call our office if you haven't heard anything after this time frame.  Take care,  Tinnie Harada, NP

## 2024-06-03 NOTE — Assessment & Plan Note (Signed)
 Intermittent vertigo and balance issues are present. Meclizine  is available for symptomatic relief but is not effective. Consider referral to vestibular physical therapy and use meclizine  as needed for vertigo. She declines PT today.

## 2024-06-03 NOTE — Assessment & Plan Note (Signed)
 Chronic low back pain is worsening, with spinal hardware and scoliosis present. Pain management continues with Lyrica 50mg  TID. Continue Lyrica and follow up with pain management in a couple of weeks. She can reach back out to Dr. Mavis with neurosurgery since pain is worsening.

## 2024-06-03 NOTE — Assessment & Plan Note (Signed)
 Occasional reflux symptoms occur with certain foods and are managed with dietary modifications. Continue dietary modifications to manage reflux symptoms.

## 2024-06-03 NOTE — Assessment & Plan Note (Signed)
 Not currently taking any supplements. Check CMP and treat based on results.

## 2024-06-03 NOTE — Assessment & Plan Note (Signed)
 She has completed treatment for this and released from oncology. Continue yearly mammogram. Results requested from mammogram done yesterday.

## 2024-06-03 NOTE — Addendum Note (Signed)
 Addended by: Oluwateniola Leitch A on: 06/03/2024 02:51 PM   Modules accepted: Orders

## 2024-06-03 NOTE — Assessment & Plan Note (Signed)
 Health maintenance reviewed and updated. Discussed nutrition, exercise.

## 2024-06-03 NOTE — Assessment & Plan Note (Signed)
 Fecal urgency and IBS symptoms are triggered by diet. Awaiting results from a recent leakage test. Follow the FODMAP diet for 3-4 weeks, then reintroduce foods gradually. Start fiber supplementation, one capsule daily, then increase to twice daily. Contact the gastroenterologist for test results and continue probiotic use.

## 2024-06-03 NOTE — Assessment & Plan Note (Signed)
 Intermittent hot flashes are present. She is using non-estrogen supplements approved by her oncologist. Continue current non-estrogen supplements. Use a clonidine  patch if symptoms worsen. Monitor symptoms and adjust treatment as needed.

## 2024-06-03 NOTE — Assessment & Plan Note (Signed)
 Chronic, stable. Check CMP, CBC, lipid panel today. Continue losartan -hctz 100-12.5mg  daily.

## 2024-06-03 NOTE — Progress Notes (Signed)
 BP 134/80 (BP Location: Left Arm, Patient Position: Sitting, Cuff Size: Small)   Pulse (!) 57   Temp (!) 97.3 F (36.3 C)   Ht 5' 2 (1.575 m)   Wt 133 lb 3.2 oz (60.4 kg)   LMP 06/24/2008   SpO2 98%   BMI 24.36 kg/m    Subjective:    Patient ID: Melissa Mcclure, female    DOB: 15-Dec-1955, 68 y.o.   MRN: [id number]  CC: Chief Complaint  Patient presents with  . Annual Exam    With fasting labs, concerns with left side pain and lower back pain and vertigo issues    HPI: Melissa Mcclure is a 68 y.o. female presenting on 06/03/2024 for comprehensive medical examination. Current medical complaints include:back pain, left side pain  She currently lives with: spouse Menopausal Symptoms: yes - hot flashes occasionally   Discussed the use of AI scribe software for clinical note transcription with the patient, who gave verbal consent to proceed.  History of Present Illness   Melissa Mcclure is a 67 year old female who presents with worsening back pain and balance issues.  She experiences worsening back pain, especially when standing for long periods. She has undergone spinal surgery with rods and screws and notes increased curvature at the top of her spine. She takes Lyrica and attends regular pain management visits.  She has balance issues and vertigo, using meclizine  as needed, though it is ineffective. She has attended therapy for balance issues and has not experienced recent falls or tinnitus.  She experiences gastrointestinal issues, including reflux and bowel irregularities, and is asking about the low FODMAP diet. She is considering resuming fiber supplements and continues to take probiotics. She awaits results from a leakage test conducted four weeks ago.  She reports left-sided pain, suspecting a urinary tract infection, but denies frequent urination or dysuria. This pain occurs intermittently, particularly in the afternoons or at night.     Depression and Anxiety Screen done today  and results listed below:     06/03/2024    9:55 AM 12/09/2023   10:55 AM 05/28/2023   10:54 AM 11/20/2022    9:48 AM 05/25/2022   12:16 PM  Depression screen PHQ 2/9  Decreased Interest 0 0 0 0 0  Down, Depressed, Hopeless 0 0 0 0 0  PHQ - 2 Score 0 0 0 0 0  Altered sleeping 0  0    Tired, decreased energy 0  0    Change in appetite 0  0    Feeling bad or failure about yourself  0  0    Trouble concentrating 0  0    Moving slowly or fidgety/restless 0  0    Suicidal thoughts 0  0    PHQ-9 Score 0  0    Difficult doing work/chores Not difficult at all          05/28/2023   10:55 AM  GAD 7 : Generalized Anxiety Score  Nervous, Anxious, on Edge 0  Control/stop worrying 0  Worry too much - different things 0  Trouble relaxing 0  Restless 0  Easily annoyed or irritable 0  Afraid - awful might happen 0  Total GAD 7 Score 0    The patient does not have a history of falls. I did not complete a risk assessment for falls. A plan of care for falls was not documented.   Past Medical History:  Past Medical History:  Diagnosis Date  .  Arthritis   . Cancer (HCC) 04/2010   melanoma-left knee, right arm  . Chronic back pain   . Complication of anesthesia   . Ductal carcinoma in situ (DCIS) of right breast 10/2017  . Family history of adverse reaction to anesthesia    mother has PONV  . Family history of colon cancer   . Family history of melanoma   . Family history of ovarian cancer   . Genetic testing 12/23/2017   STAT Breast panel with reflex to Multi-Cancer panel (83 genes) @ [facility] - No pathogenic mutations detected  . GERD (gastroesophageal reflux disease)   . H/O hiatal hernia   . Heart murmur    no problems  . History of hand surgery   . Hyperlipidemia   . Hypertension   . Left rotator cuff tear   . Malignant melanoma of lower leg (HCC) 03/10/2015  . Melanoma of right upper arm (HCC) 10/12/2013  . PONV (postoperative nausea and vomiting)    zofran  works great  .  Seasonal allergies   . Vertigo     Surgical History:  Past Surgical History:  Procedure Laterality Date  . ABDOMINAL HYSTERECTOMY    . ANAL RECTAL MANOMETRY N/A 05/06/2024   Procedure: MANOMETRY, ANORECTAL;  Surgeon: San Sandor GAILS, DO;  Location: [facility];  Service: Gastroenterology;  Laterality: N/A;  . ANTERIOR LAT LUMBAR FUSION Right 04/17/2013   Procedure: ANTERIOR LATERAL LUMBAR FUSION 3 LEVELS;  Surgeon: Fairy Levels, MD;  Location: [facility];  Service: Neurosurgery;  Laterality: Right;  Right Lumbar Two-Three Lumbar Three-Four Lumbar Four-Five  Anterolateral Fusion  . BACK SURGERY    . BREAST LUMPECTOMY WITH RADIOACTIVE SEED LOCALIZATION Right 12/25/2017   Procedure: RIGHT BREAST LUMPECTOMY WITH RADIOACTIVE SEED LOCALIZATION;  Surgeon: Gail Favorite, MD;  Location: [facility];  Service: General;  Laterality: Right;  . BREAST SURGERY    . CESAREAN SECTION  1978  . CESAREAN SECTION  1980  . COLONOSCOPY    . COLONOSCOPY WITH PROPOFOL  N/A 06/23/2018   Procedure: COLONOSCOPY WITH PROPOFOL ;  Surgeon: Rosalie Kitchens, MD;  Location: [facility];  Service: Endoscopy;  Laterality: N/A;  . CYSTOSCOPY N/A 06/26/2016   Procedure: CYSTOSCOPY;  Surgeon: Ronal GORMAN Pinal, MD;  Location: [facility];  Service: Gynecology;  Laterality: N/A;  . DILATATION & CURETTAGE/HYSTEROSCOPY WITH MYOSURE N/A 01/24/2016   Procedure: DILATATION & CURETTAGE/HYSTEROSCOPY WITH MYOSURE;  Surgeon: Ronal GORMAN Pinal, MD;  Location: [facility];  Service: Gynecology;  Laterality: N/A;  . ESOPHAGEAL MANOMETRY N/A 10/16/2023   Procedure: ESOPHAGEAL MANOMETRY (EM);  Surgeon: San Sandor GAILS, DO;  Location: [facility];  Service: Gastroenterology;  Laterality: N/A;  . EYE SURGERY    . FOOT SURGERY Right    early 20s-bone spur removal  . HAND SURGERY Bilateral    for basal joint arthritis  . HERNIA REPAIR    . LAPAROSCOPIC HYSTERECTOMY Bilateral 06/26/2016   Procedure: HYSTERECTOMY TOTAL LAPAROSCOPIC;   Surgeon: Ronal GORMAN Pinal, MD;  Location: [facility];  Service: Gynecology;  Laterality: Bilateral;  . LUMBAR PERCUTANEOUS PEDICLE SCREW 3 LEVEL N/A 04/17/2013   Procedure: LUMBAR PERCUTANEOUS PEDICLE SCREW 3 LEVEL;  Surgeon: Fairy Levels, MD;  Location: [facility];  Service: Neurosurgery;  Laterality: N/A;  Decompression and Posterior Lumbar Interbody Fusion of Lumbar Five-Sacral One, Percutaneous Screws Lumbar Two through Sacral One  . MELANOMA EXCISION     knee 08 lft. elbow 11rt  . SALPINGOOPHORECTOMY Bilateral 06/26/2016   Procedure: SALPINGO OOPHORECTOMY;  Surgeon: Ronal GORMAN Pinal,  MD;  Location: [facility];  Service: Gynecology;  Laterality: Bilateral;  . SHOULDER SURGERY Right 04/28/2015  . SHOULDER SURGERY Left 2023  . STRABISMUS SURGERY Bilateral 07/29/2020   Procedure: BILATERAL STRABISMUS REPAIR;  Surgeon: Neysa Fallow, MD;  Location: [facility];  Service: Ophthalmology;  Laterality: Bilateral;  . TUBAL LIGATION  1986  . WLE  06/17/2007   and left groin lymph node  . XI ROBOTIC ASSISTED HIATAL HERNIA REPAIR N/A 04/04/2021   Procedure: ROBOTIC HIATAL HERNIA REPAIR WITH FUNDOPLICATION;  Surgeon: Rubin Calamity, MD;  Location: [facility];  Service: General;  Laterality: N/A;    Medications:  Current Outpatient Medications on File Prior to Visit  Medication Sig  . Ascorbic Acid  (VITAMIN C  WITH ROSE HIPS) 1000 MG tablet Take 1,000 mg by mouth daily.  . atorvastatin  (LIPITOR) 10 MG tablet TAKE 1 TABLET BY MOUTH EVERY DAY  . cetirizine (ZYRTEC) 10 MG tablet Take 10 mg by mouth daily.  . diclofenac  (CATAFLAM) 50 MG tablet Take 50 mg by mouth 3 (three) times daily. (Patient taking differently: Take 50 mg by mouth as needed.)  . losartan -hydrochlorothiazide  (HYZAAR ) 100-12.5 MG tablet TAKE 1 TABLET BY MOUTH DAILY. REPORTED ON 01/23/2016  . meclizine  (ANTIVERT ) 25 MG tablet TAKE 1 TABLET (25 MG TOTAL) BY MOUTH 3 (THREE) TIMES DAILY AS NEEDED (VERTIGO). REPORTED ON 01/23/2016  . Multiple  Vitamins-Minerals (ALIVE WOMENS 50+) TABS Take 1 tablet by mouth daily.  . pregabalin (LYRICA) 50 MG capsule Take 50 mg by mouth 3 (three) times daily.  . triamcinolone  ointment (KENALOG ) 0.5 % Apply topically twice daily for up to 7 days as needed for vulvar itching. (Patient taking differently: Apply 1 Application topically as needed. Apply topically twice daily for up to 7 days as needed for vulvar itching.)   No current facility-administered medications on file prior to visit.    Allergies:  Allergies  Allergen Reactions  . Elemental Sulfur Hives    Social History:  Social History   Socioeconomic History  . Marital status: Married    Spouse name: Not on file  . Number of children: Not on file  . Years of education: Not on file  . Highest education level: Not on file  Occupational History  . Not on file  Tobacco Use  . Smoking status: Never  . Smokeless tobacco: Never  Vaping Use  . Vaping status: Never Used  Substance and Sexual Activity  . Alcohol use: Yes    Alcohol/week: 0.0 - 1.0 standard drinks of alcohol    Comment: socially  . Drug use: No  . Sexual activity: Not Currently    Partners: Male    Birth control/protection: Surgical, Post-menopausal    Comment: hysterectomy  Other Topics Concern  . Not on file  Social History Narrative  . Not on file   Social Drivers of Health   Financial Resource Strain: Not on file  Food Insecurity: Not on file  Transportation Needs: Not on file  Physical Activity: Not on file  Stress: Not on file  Social Connections: Not on file  Intimate Partner Violence: Not on file   Social History   Tobacco Use  Smoking Status Never  Smokeless Tobacco Never   Social History   Substance and Sexual Activity  Alcohol Use Yes  . Alcohol/week: 0.0 - 1.0 standard drinks of alcohol   Comment: socially    Family History:  Family History  Problem Relation Age of Onset  . Diabetes Father  borderline  . Melanoma Father         dx 17s/60s; deceased 21s  . Congestive Heart Failure Father   . Arthritis Father   . Colon cancer Mother        dx 54s; deceased 47  . Hypertension Mother   . Osteoporosis Mother   . Cancer Mother   . Ovarian cancer Maternal Grandmother        deceased 36s  . Heart disease Paternal Grandfather   . Melanoma Daughter        on leg; dx 30s    Past medical history, surgical history, medications, allergies, family history and social history reviewed with patient today and changes made to appropriate areas of the chart.   Review of Systems  Constitutional:  Positive for malaise/fatigue. Negative for fever.  HENT: Negative.    Eyes: Negative.   Respiratory: Negative.    Cardiovascular: Negative.   Gastrointestinal:  Positive for heartburn (at times). Negative for abdominal pain.       Fecal urgency  Genitourinary: Negative.   Musculoskeletal:  Positive for back pain.  Skin: Negative.   Neurological:  Positive for dizziness (at times).  Psychiatric/Behavioral: Negative.     All other ROS negative except what is listed above and in the HPI.      Objective:    BP 134/80 (BP Location: Left Arm, Patient Position: Sitting, Cuff Size: Small)   Pulse (!) 57   Temp (!) 97.3 F (36.3 C)   Ht 5' 2 (1.575 m)   Wt 133 lb 3.2 oz (60.4 kg)   LMP 06/24/2008   SpO2 98%   BMI 24.36 kg/m   Wt Readings from Last 3 Encounters:  06/03/24 133 lb 3.2 oz (60.4 kg)  12/11/23 134 lb 6.4 oz (61 kg)  12/10/23 134 lb 9.6 oz (61.1 kg)    Physical Exam Vitals and nursing note reviewed.  Constitutional:      General: She is not in acute distress.    Appearance: Normal appearance.  HENT:     Head: Normocephalic and atraumatic.     Right Ear: Tympanic membrane, ear canal and external ear normal.     Left Ear: Tympanic membrane, ear canal and external ear normal.     Mouth/Throat:     Mouth: Mucous membranes are moist.     Pharynx: No posterior oropharyngeal erythema.  Eyes:      Conjunctiva/sclera: Conjunctivae normal.  Cardiovascular:     Rate and Rhythm: Normal rate and regular rhythm.     Pulses: Normal pulses.     Heart sounds: Normal heart sounds.  Pulmonary:     Effort: Pulmonary effort is normal.     Breath sounds: Normal breath sounds.  Abdominal:     Palpations: Abdomen is soft.     Tenderness: There is no abdominal tenderness.  Musculoskeletal:     Cervical back: Normal range of motion and neck supple.     Right lower leg: No edema.     Left lower leg: No edema.     Comments: Lumbar spine ROM limited  Lymphadenopathy:     Cervical: No cervical adenopathy.  Skin:    General: Skin is warm and dry.  Neurological:     General: No focal deficit present.     Mental Status: She is alert and oriented to person, place, and time.     Cranial Nerves: No cranial nerve deficit.     Coordination: Coordination normal.     Gait: Gait normal.  Psychiatric:        Mood and Affect: Mood normal.        Behavior: Behavior normal.        Thought Content: Thought content normal.        Judgment: Judgment normal.     Results for orders placed or performed in visit on 06/03/24  POCT urinalysis dipstick   Collection Time: 06/03/24 10:50 AM  Result Value Ref Range   Color, UA     Clarity, UA     Glucose, UA Negative Negative   Bilirubin, UA Negative    Ketones, UA Negative    Spec Grav, UA 1.015 1.010 - 1.025   Blood, UA Negative    pH, UA 7.0 5.0 - 8.0   Protein, UA Negative Negative   Urobilinogen, UA 0.2 0.2 or 1.0 E.U./dL   Nitrite, UA Negative    Leukocytes, UA Negative Negative   Appearance     Odor        Assessment & Plan:   Problem List Items Addressed This Visit       Cardiovascular and Mediastinum   Essential hypertension   Chronic, stable. Check CMP, CBC, lipid panel today. Continue losartan -hctz 100-12.5mg  daily.       Relevant Orders   CBC with Differential/Platelet   Comprehensive metabolic panel with GFR   Lipid panel   Hot  flashes due to menopause   Intermittent hot flashes are present. She is using non-estrogen supplements approved by her oncologist. Continue current non-estrogen supplements. Use a clonidine  patch if symptoms worsen. Monitor symptoms and adjust treatment as needed.         Digestive   GERD (gastroesophageal reflux disease)   Occasional reflux symptoms occur with certain foods and are managed with dietary modifications. Continue dietary modifications to manage reflux symptoms.        Nervous and Auditory   Labyrinthitis   Intermittent vertigo and balance issues are present. Meclizine  is available for symptomatic relief but is not effective. Consider referral to vestibular physical therapy and use meclizine  as needed for vertigo. She declines PT today.         Other   Chronic low back pain without sciatica   Chronic low back pain is worsening, with spinal hardware and scoliosis present. Pain management continues with Lyrica 50mg  TID. Continue Lyrica and follow up with pain management in a couple of weeks. She can reach back out to Dr. Mavis with neurosurgery since pain is worsening.       Relevant Orders   POCT urinalysis dipstick (Completed)   Routine general medical examination at a health care facility - Primary   Health maintenance reviewed and updated. Discussed nutrition, exercise.       Hypokalemia   Not currently taking any supplements. Check CMP and treat based on results.       Relevant Orders   Comprehensive metabolic panel with GFR   Fecal urgency   Fecal urgency and IBS symptoms are triggered by diet. Awaiting results from a recent leakage test. Follow the FODMAP diet for 3-4 weeks, then reintroduce foods gradually. Start fiber supplementation, one capsule daily, then increase to twice daily. Contact the gastroenterologist for test results and continue probiotic use.      History of ductal carcinoma in situ (DCIS) of right breast   She has completed treatment for  this and released from oncology. Continue yearly mammogram. Results requested from mammogram done yesterday.         Follow up plan:  Return in about 6 months (around 12/01/2024) for HTN, back pain.   LABORATORY TESTING:  - Pap smear: not applicable  IMMUNIZATIONS:   - Tdap: Tetanus vaccination status reviewed: last tetanus booster within 10 years. - Influenza: Declined - Pneumovax: Up to date - Prevnar: Up to date - HPV: Not applicable - Shingrix vaccine: Up to date  SCREENING: -Mammogram: Up to date  - Colonoscopy: Up to date  - Bone Density: Up to date   PATIENT COUNSELING:   Advised to take 1 mg of folate supplement per day if capable of pregnancy.   Sexuality: Discussed sexually transmitted diseases, partner selection, use of condoms, avoidance of unintended pregnancy  and contraceptive alternatives.   Advised to avoid cigarette smoking.  I discussed with the patient that most people either abstain from alcohol or drink within safe limits (<=14/week and <=4 drinks/occasion for males, <=7/weeks and <= 3 drinks/occasion for females) and that the risk for alcohol disorders and other health effects rises proportionally with the number of drinks per week and how often a drinker exceeds daily limits.  Discussed cessation/primary prevention of drug use and availability of treatment for abuse.   Diet: Encouraged to adjust caloric intake to maintain  or achieve ideal body weight, to reduce intake of dietary saturated fat and total fat, to limit sodium intake by avoiding high sodium foods and not adding table salt, and to maintain adequate dietary potassium and calcium  preferably from fresh fruits, vegetables, and low-fat dairy products.    stressed the importance of regular exercise  Injury prevention: Discussed safety belts, safety helmets, smoke detector, smoking near bedding or upholstery.   Dental health: Discussed importance of regular tooth brushing, flossing, and dental  visits.    NEXT PREVENTATIVE PHYSICAL DUE IN 1 YEAR. Return in about 6 months (around 12/01/2024) for HTN, back pain.  Aristides Luckey A Irwin Toran

## 2024-06-04 ENCOUNTER — Encounter: Payer: Self-pay | Admitting: Nurse Practitioner

## 2024-06-15 ENCOUNTER — Encounter: Payer: Self-pay | Admitting: Gastroenterology

## 2024-06-15 ENCOUNTER — Ambulatory Visit: Admitting: Gastroenterology

## 2024-06-15 VITALS — BP 158/68 | HR 76 | Ht 62.0 in | Wt 137.0 lb

## 2024-06-15 DIAGNOSIS — K580 Irritable bowel syndrome with diarrhea: Secondary | ICD-10-CM | POA: Diagnosis not present

## 2024-06-15 DIAGNOSIS — Z800 Family history of malignant neoplasm of digestive organs: Secondary | ICD-10-CM

## 2024-06-15 DIAGNOSIS — R152 Fecal urgency: Secondary | ICD-10-CM

## 2024-06-15 DIAGNOSIS — R140 Abdominal distension (gaseous): Secondary | ICD-10-CM | POA: Diagnosis not present

## 2024-06-15 DIAGNOSIS — R151 Fecal smearing: Secondary | ICD-10-CM | POA: Diagnosis not present

## 2024-06-15 DIAGNOSIS — R1310 Dysphagia, unspecified: Secondary | ICD-10-CM

## 2024-06-15 NOTE — Patient Instructions (Signed)
 _______________________________________________________  If your blood pressure at your visit was 140/90 or greater, please contact your primary care physician to follow up on this.  _______________________________________________________  If you are age 69 or older, your body mass index should be between 23-30. Your Body mass index is 25.06 kg/m. If this is out of the aforementioned range listed, please consider follow up with your Primary Care Provider.  If you are age 41 or younger, your body mass index should be between 19-25. Your Body mass index is 25.06 kg/m. If this is out of the aformentioned range listed, please consider follow up with your Primary Care Provider.   ________________________________________________________  The [facility] providers would like to encourage you to use MYCHART to communicate with providers for non-urgent requests or questions.  Due to long hold times on the telephone, sending your provider a message by [facility] may be a faster and more efficient way to get a response.  Please allow 48 business hours for a response.  Please remember that this is for non-urgent requests.  _______________________________________________________  [facility] is using a team-based approach to care.  Your team is made up of your doctor and two to three APPS. Our APPS (Nurse Practitioners and Physician Assistants) work with your physician to ensure care continuity for you. They are fully qualified to address your health concerns and develop a treatment plan. They communicate directly with your gastroenterologist to care for you. Seeing the Advanced Practice Practitioners on your physician's team can help you by facilitating care more promptly, often allowing for earlier appointments, access to diagnostic testing, procedures, and other specialty referrals.   You have been scheduled for an appointment with _______ at [facility]. Your appointment is on _____  at ______. Please arrive at ______ for registration. Make certain to bring a list of current medications, including any over the counter medications or vitamins. Also bring your co-pay if you have one as well as your insurance cards. [facility] is located at [address], [address]. Should you need to reschedule your appointment, please contact them at [phone].  It was a pleasure to see you today!  Vito Cirigliano, D.O.

## 2024-06-15 NOTE — Progress Notes (Signed)
 Chief Complaint:    IBS, GERD  GI History: 68 year old female with medical history as outlined below, follows in GI clinic for the following:   1) GERD.  Prior longstanding history of GERD.  Had been on Protonix  for years prior to surgery. Underwent robotic hiatal hernia repair and toupee fundoplication by Dr. Rubin in 03/2021.  UGI series in 12/2020 for preoperative evaluation was notable for large fixed hiatal hernia, normal gastric emptying, mild reflux, severe esophageal dysmotility with significant weakening of primary peristalsis, suggestive of chronic reflux related dysmotility.  Now with occasional heartburn once/month.  No longer requires PPI.   2) Dysphagia.  Intermittent food dysphagia.  Symptoms have been present prior to surgery as well, pointing to suprasternal notch and upper sternum.  Worse with rice, chicken. Sxs actually improved after surgery.  - 05/2023: EGD: Intact fundoplication, otherwise normal - 09/2023: Esophageal manometry: Normal   3) Diarrhea.  Postprandial diarrhea for many years.Tends to be worse with sweet foods. Sxs occur a few days/month. Takes Metamucil daily.  Will use Imodium when traveling with good effect. - 11/2023: Prescribed Viberzi  (developed hard stools) - 01/2024: SIBO breath test: Hydrogen slightly increased at 20 ppm, methane increased at 26 ppm, consistent with IMO and prescribed rifaximin  and neomycin  - 04/2024: ARM: Weak anal sphincter resting with normal squeeze pressures, paradoxical contraction of anal sphincter with inadequate intrarectal pressure generation during attempted defecation, rectal hyposensitivity with normal balloon expulsion test.  Results consistent with weak internal anal sphincter and dyssynergy defecation with recommendation for referral for pelvic floor PT and colorectal surgery for possible InterStim placement   Mother with colon cancer. Daughter with thousands of polyps and had a total colectomy with J pouch.       Endoscopic History: - 08/2014: Colonoscopy (Dr. Rosalie): Internal/external hemorrhoids, sigmoid diverticulosis, large amount of stool with incomplete clearance and poor visualization.  Repeat in 3 years - 05/2018: Colonoscopy (Dr. Rosalie): Internal/external hemorrhoids, sigmoid diverticulosis, otherwise normal.  Repeat 5 years due to family history. - 06/19/2023: EGD: Normal esophagus, intact toupee fundoplication, normal stomach and duodenum - 06/19/2023: Colonoscopy: Left-sided diverticulosis, otherwise normal colon.  Biopsies negative for microscopic colitis.  Significant looping.  Small grade 1 internal hemorrhoids.  Repeat 5 years    HPI:     Patient is a 68 y.o. female presenting to the Gastroenterology Clinic for follow-up.  Was last seen by me on 12/11/2023.  Main issue at that time was intermittent solid food dysphagia along with postprandial diarrhea.  Ordered SIBO breath testing, recommended low FODMAP diet, ordered Viberzi , and ordered ARM to evaluate fecal leakage further.    Continues to have IBS symptoms with loose stools and intermittent fecal leakage.  Taking fiber tablets twice daily.  Tried Viberzi , but had constipation. Decreased to daily dosing, and still with constipation so she stopped completely several months ago. Never tried going to every other day dosing.   Reviewed labs from earlier this month: CO2 33 (stable from previous) with otherwise normal CMP, normal CBC  Review of systems:     No chest pain, no SOB, no fevers, no urinary sx   Past Medical History:  Diagnosis Date   Arthritis    Cancer (HCC) 04/2010   melanoma-left knee, right arm   Chronic back pain    Complication of anesthesia    Ductal carcinoma in situ (DCIS) of right breast 10/2017   Family history of adverse reaction to anesthesia    mother has PONV   Family history of colon  cancer    Family history of melanoma    Family history of ovarian cancer    Genetic testing 12/23/2017   STAT Breast  panel with reflex to Multi-Cancer panel (83 genes) @ [facility] - No pathogenic mutations detected   GERD (gastroesophageal reflux disease)    H/O hiatal hernia    Heart murmur    no problems   History of hand surgery    Hyperlipidemia    Hypertension    Left rotator cuff tear    Malignant melanoma of lower leg (HCC) 03/10/2015   Melanoma of right upper arm (HCC) 10/12/2013   PONV (postoperative nausea and vomiting)    zofran  works great   Seasonal allergies    Vertigo     Patient's surgical history, family medical history, social history, medications and allergies were all reviewed in Epic    Current Outpatient Medications  Medication Sig Dispense Refill   acidophilus (RISAQUAD) CAPS capsule Take 1 capsule by mouth daily.     Ascorbic Acid  (VITAMIN C  WITH ROSE HIPS) 1000 MG tablet Take 1,000 mg by mouth daily.     atorvastatin  (LIPITOR) 10 MG tablet Take 1 tablet (10 mg total) by mouth daily. 90 tablet 1   cetirizine (ZYRTEC) 10 MG tablet Take 10 mg by mouth daily.     diclofenac  (CATAFLAM) 50 MG tablet Take 50 mg by mouth 3 (three) times daily. (Patient taking differently: Take 50 mg by mouth as needed.)     losartan -hydrochlorothiazide  (HYZAAR ) 100-12.5 MG tablet Take 1 tablet by mouth daily. Reported on 01/23/2016 90 tablet 1   meclizine  (ANTIVERT ) 25 MG tablet TAKE 1 TABLET (25 MG TOTAL) BY MOUTH 3 (THREE) TIMES DAILY AS NEEDED (VERTIGO). REPORTED ON 01/23/2016 30 tablet 1   Multiple Vitamins-Minerals (ALIVE WOMENS 50+) TABS Take 1 tablet by mouth daily.     pregabalin (LYRICA) 50 MG capsule Take 50 mg by mouth 3 (three) times daily.     triamcinolone  ointment (KENALOG ) 0.5 % Apply topically twice daily for up to 7 days as needed for vulvar itching. (Patient taking differently: Apply 1 Application topically as needed. Apply topically twice daily for up to 7 days as needed for vulvar itching.) 90 g 1   No current facility-administered medications for this visit.    Physical Exam:      BP (!) 158/68   Pulse 76   Ht 5' 2 (1.575 m)   Wt 137 lb (62.1 kg)   LMP 06/24/2008   BMI 25.06 kg/m   GENERAL:  Pleasant female in NAD PSYCH: : Cooperative, normal affect EENT:  conjunctiva pink, mucous membranes moist, neck supple without masses CARDIAC:  RRR, no murmur heard, no peripheral edema PULM: Normal respiratory effort, lungs CTA bilaterally, no wheezing ABDOMEN:  Nondistended, soft, nontender. No obvious masses, no hepatomegaly,  normal bowel sounds SKIN:  turgor, no lesions seen Musculoskeletal:  Normal muscle tone, normal strength NEURO: Alert and oriented x 3, no focal neurologic deficits   IMPRESSION and PLAN:    1) IBS-D 2) Abdominal cramping 3) Fecal urgency with fecal seepage 4) Abdominal bloating  - Restart Viberzi  at 100 mg every other day.  I asked her to message me in about 2 weeks to let me know how she is doing and can adjust dosing as necessary - Continue fiber supplement - Referral to Colorectal Surgery for evaluation and treatment of fecal leakage with rectal hyposensitivity and decreased anal sphincter pressure with consideration for Solesta injection vs InterStim placement  5) Dysphagia  EGD and esophageal manometry are normal with intact fundoplication.  Symptoms were present prior to hiatal hernia repair/fundoplication.  Continue with conservative management  6) Family history of colon cancer - Repeat colonoscopy 05/2028 for ongoing screening  I spent 35 minutes of time, including in depth chart review, independent review of results as outlined above, communicating results with the patient directly, face-to-face time with the patient, coordinating care, and ordering studies and medications as appropriate, and documentation.           Sandor LULLA Flatter ,DO, FACG 06/15/2024, 11:29 AM

## 2024-07-01 DIAGNOSIS — L570 Actinic keratosis: Secondary | ICD-10-CM | POA: Diagnosis not present

## 2024-07-01 DIAGNOSIS — L814 Other melanin hyperpigmentation: Secondary | ICD-10-CM | POA: Diagnosis not present

## 2024-07-01 DIAGNOSIS — Z85828 Personal history of other malignant neoplasm of skin: Secondary | ICD-10-CM | POA: Diagnosis not present

## 2024-07-01 DIAGNOSIS — D225 Melanocytic nevi of trunk: Secondary | ICD-10-CM | POA: Diagnosis not present

## 2024-07-01 DIAGNOSIS — D224 Melanocytic nevi of scalp and neck: Secondary | ICD-10-CM | POA: Diagnosis not present

## 2024-07-01 DIAGNOSIS — R150 Incomplete defecation: Secondary | ICD-10-CM | POA: Diagnosis not present

## 2024-07-02 ENCOUNTER — Encounter: Payer: Self-pay | Admitting: Gastroenterology

## 2024-07-02 ENCOUNTER — Other Ambulatory Visit: Payer: Self-pay

## 2024-07-02 DIAGNOSIS — K580 Irritable bowel syndrome with diarrhea: Secondary | ICD-10-CM

## 2024-07-02 MED ORDER — VIBERZI 100 MG PO TABS: 100.0000 mg | ORAL_TABLET | ORAL | 5 refills | Status: AC

## 2024-07-02 MED ORDER — VIBERZI 100 MG PO TABS: 100.0000 mg | ORAL_TABLET | ORAL | 5 refills | Status: DC

## 2024-07-02 NOTE — Addendum Note (Signed)
 Addended by: SAN SANDOR GAILS on: 07/02/2024 05:02 PM   Modules accepted: Orders

## 2024-07-10 ENCOUNTER — Encounter: Payer: Self-pay | Admitting: Internal Medicine

## 2024-07-10 ENCOUNTER — Ambulatory Visit: Admitting: Internal Medicine

## 2024-07-10 VITALS — BP 134/80 | HR 57 | Temp 97.7°F | Ht 62.0 in | Wt 136.0 lb

## 2024-07-10 DIAGNOSIS — S90424A Blister (nonthermal), right lesser toe(s), initial encounter: Secondary | ICD-10-CM | POA: Diagnosis not present

## 2024-07-10 MED ORDER — CEPHALEXIN 500 MG PO CAPS: 500.0000 mg | ORAL_CAPSULE | Freq: Four times a day (QID) | ORAL | 0 refills | Status: AC

## 2024-07-10 MED ORDER — MUPIROCIN 2 % EX OINT: 1.0000 | TOPICAL_OINTMENT | Freq: Two times a day (BID) | CUTANEOUS | 1 refills | Status: AC

## 2024-07-10 NOTE — Patient Instructions (Signed)
 Epsom salt soaks  Blister band aids  Rest, leave open to air when possible. Elevate foot when possible.   Take oral antibiotic if symptoms do no improve.

## 2024-07-10 NOTE — Progress Notes (Signed)
 [facility] [address] [place] [address] Dept: [phone] Dept Fax: [phone]  Acute Care Office Visit  Subjective:   Melissa Mcclure [birth year] 07/10/2024  Chief Complaint  Patient presents with   Foot Injury    Right foot pinky toe blister since Sunday clear fluid     HPI:  History of Present Illness   Melissa Mcclure is a 68 year old female who presents with a persistent blister on her toe.  She developed a blister on her right little toe on Sunday night after returning home from sports game. Initially, she attributed it to her shoe rubbing, but the blister spread to another toe and began to fill with fluid. Blister spontaneously opened and drained clear fluid, then re-accumulated later this week again.She has been applying peroxide and Neosporin, and covering it with a Band-Aid at night while leaving it open during the day.  The fluid in the blister is clear, and it becomes sore when drained. She has been wearing flip-flops to avoid further irritation. No new shoe use, and the shoes she wore were not new and had been worn comfortably before.  No history of diabetes or herpes. The patient has not noticed any recent bug/spider bites. No fever or systemic signs of infection.  Current medications include Neosporin ointment applied to the blister, and she has been using peroxide for cleaning.      The following portions of the patient's history were reviewed and updated as appropriate: past medical history, past surgical history, family history, social history, allergies, medications, and problem list.   Patient Active Problem List   Diagnosis Date Noted   History of ductal carcinoma in situ (DCIS) of right breast 06/03/2024   Esophageal dysphagia 05/14/2024   Fecal urgency 12/11/2023   Incontinence of feces 12/11/2023   Routine general medical examination at a health care facility 05/28/2023   Hypokalemia  05/28/2023   Hot flashes due to menopause 05/28/2023   Overactive bladder 05/28/2023   PAC (premature atrial contraction) 11/22/2021   Need for vaccination against Streptococcus pneumoniae 11/22/2021   Chronic low back pain without sciatica 11/22/2021   Elevated LDL cholesterol level 08/22/2021   S/P repair of paraesophageal hernia 04/04/2021   Labyrinthitis 02/13/2021   Medication monitoring encounter 12/23/2017   Family history of ovarian cancer    Family history of colon cancer    Family history of melanoma    Hiatal hernia 12/30/2015   GERD (gastroesophageal reflux disease) 10/12/2013   Essential hypertension 10/12/2013   Past Medical History:  Diagnosis Date   Arthritis    Cancer (HCC) 04/2010   melanoma-left knee, right arm   Chronic back pain    Complication of anesthesia    Ductal carcinoma in situ (DCIS) of right breast 10/2017   Family history of adverse reaction to anesthesia    mother has PONV   Family history of colon cancer    Family history of melanoma    Family history of ovarian cancer    Genetic testing 12/23/2017   STAT Breast panel with reflex to Multi-Cancer panel (83 genes) @ [facility] - No pathogenic mutations detected   GERD (gastroesophageal reflux disease)    H/O hiatal hernia    Heart murmur    no problems   History of hand surgery    Hyperlipidemia    Hypertension    Left rotator cuff tear    Malignant melanoma of lower leg (HCC) 03/10/2015   Melanoma of  right upper arm (HCC) 10/12/2013   PONV (postoperative nausea and vomiting)    zofran  works great   Seasonal allergies    Vertigo    Past Surgical History:  Procedure Laterality Date   ABDOMINAL HYSTERECTOMY     ANAL RECTAL MANOMETRY N/A 05/06/2024   Procedure: MANOMETRY, ANORECTAL;  Surgeon: San Sandor GAILS, DO;  Location: [facility];  Service: Gastroenterology;  Laterality: N/A;   ANTERIOR LAT LUMBAR FUSION Right 04/17/2013   Procedure: ANTERIOR LATERAL LUMBAR FUSION 3 LEVELS;   Surgeon: Fairy Levels, MD;  Location: [facility];  Service: Neurosurgery;  Laterality: Right;  Right Lumbar Two-Three Lumbar Three-Four Lumbar Four-Five  Anterolateral Fusion   BACK SURGERY     BREAST LUMPECTOMY WITH RADIOACTIVE SEED LOCALIZATION Right 12/25/2017   Procedure: RIGHT BREAST LUMPECTOMY WITH RADIOACTIVE SEED LOCALIZATION;  Surgeon: Gail Favorite, MD;  Location: [facility];  Service: General;  Laterality: Right;   BREAST SURGERY     CESAREAN SECTION  1978   CESAREAN SECTION  1980   COLONOSCOPY     COLONOSCOPY WITH PROPOFOL  N/A 06/23/2018   Procedure: COLONOSCOPY WITH PROPOFOL ;  Surgeon: Rosalie Kitchens, MD;  Location: [facility];  Service: Endoscopy;  Laterality: N/A;   CYSTOSCOPY N/A 06/26/2016   Procedure: CYSTOSCOPY;  Surgeon: Ronal GORMAN Pinal, MD;  Location: [facility];  Service: Gynecology;  Laterality: N/A;   DILATATION & CURETTAGE/HYSTEROSCOPY WITH MYOSURE N/A 01/24/2016   Procedure: DILATATION & CURETTAGE/HYSTEROSCOPY WITH MYOSURE;  Surgeon: Ronal GORMAN Pinal, MD;  Location: [facility];  Service: Gynecology;  Laterality: N/A;   ESOPHAGEAL MANOMETRY N/A 10/16/2023   Procedure: ESOPHAGEAL MANOMETRY (EM);  Surgeon: San Sandor GAILS, DO;  Location: [facility];  Service: Gastroenterology;  Laterality: N/A;   EYE SURGERY     FOOT SURGERY Right    early 20s-bone spur removal   HAND SURGERY Bilateral    for basal joint arthritis   HERNIA REPAIR     LAPAROSCOPIC HYSTERECTOMY Bilateral 06/26/2016   Procedure: HYSTERECTOMY TOTAL LAPAROSCOPIC;  Surgeon: Ronal GORMAN Pinal, MD;  Location: [facility];  Service: Gynecology;  Laterality: Bilateral;   LUMBAR PERCUTANEOUS PEDICLE SCREW 3 LEVEL N/A 04/17/2013   Procedure: LUMBAR PERCUTANEOUS PEDICLE SCREW 3 LEVEL;  Surgeon: Fairy Levels, MD;  Location: [facility];  Service: Neurosurgery;  Laterality: N/A;  Decompression and Posterior Lumbar Interbody Fusion of Lumbar Five-Sacral One, Percutaneous Screws Lumbar Two through Sacral One    MELANOMA EXCISION     knee 08 lft. elbow 11rt   SALPINGOOPHORECTOMY Bilateral 06/26/2016   Procedure: SALPINGO OOPHORECTOMY;  Surgeon: Ronal GORMAN Pinal, MD;  Location: [facility];  Service: Gynecology;  Laterality: Bilateral;   SHOULDER SURGERY Right 04/28/2015   SHOULDER SURGERY Left 2023   STRABISMUS SURGERY Bilateral 07/29/2020   Procedure: BILATERAL STRABISMUS REPAIR;  Surgeon: Neysa Fallow, MD;  Location: [facility];  Service: Ophthalmology;  Laterality: Bilateral;   TUBAL LIGATION  1986   [facility]  06/17/2007   and left groin lymph node   XI ROBOTIC ASSISTED HIATAL HERNIA REPAIR N/A 04/04/2021   Procedure: ROBOTIC HIATAL HERNIA REPAIR WITH FUNDOPLICATION;  Surgeon: Rubin Calamity, MD;  Location: [facility];  Service: General;  Laterality: N/A;   Family History  Problem Relation Age of Onset   Diabetes Father        borderline   Melanoma Father        dx 39s/60s; deceased 16s   Congestive Heart Failure Father    Arthritis Father    Colon cancer Mother  dx 35s; deceased 41   Hypertension Mother    Osteoporosis Mother    Cancer Mother    Ovarian cancer Maternal Grandmother        deceased 23s   Heart disease Paternal Grandfather    Melanoma Daughter        on leg; dx 30s    Current Outpatient Medications:    Ascorbic Acid  (VITAMIN C  WITH ROSE HIPS) 1000 MG tablet, Take 1,000 mg by mouth daily., Disp: , Rfl:    atorvastatin  (LIPITOR) 10 MG tablet, Take 1 tablet (10 mg total) by mouth daily., Disp: 90 tablet, Rfl: 1   cephALEXin  (KEFLEX ) 500 MG capsule, Take 1 capsule (500 mg total) by mouth 4 (four) times daily for 5 days., Disp: 20 capsule, Rfl: 0   cetirizine (ZYRTEC) 10 MG tablet, Take 10 mg by mouth daily., Disp: , Rfl:    diclofenac  (CATAFLAM) 50 MG tablet, Take 50 mg by mouth 3 (three) times daily. (Patient taking differently: Take 50 mg by mouth as needed.), Disp: , Rfl:    Eluxadoline  (VIBERZI ) 100 MG TABS, Take 1 tablet (100 mg total) by mouth every other  day., Disp: 45 tablet, Rfl: 5   Eluxadoline  (VIBERZI ) 100 MG TABS, Take 1 tablet (100 mg total) by mouth every other day., Disp: 45 tablet, Rfl: 5   losartan -hydrochlorothiazide  (HYZAAR ) 100-12.5 MG tablet, Take 1 tablet by mouth daily. Reported on 01/23/2016, Disp: 90 tablet, Rfl: 1   meclizine  (ANTIVERT ) 25 MG tablet, TAKE 1 TABLET (25 MG TOTAL) BY MOUTH 3 (THREE) TIMES DAILY AS NEEDED (VERTIGO). REPORTED ON 01/23/2016, Disp: 30 tablet, Rfl: 1   Multiple Vitamins-Minerals (ALIVE WOMENS 50+) TABS, Take 1 tablet by mouth daily., Disp: , Rfl:    mupirocin ointment (BACTROBAN) 2 %, Apply 1 Application topically 2 (two) times daily., Disp: 22 g, Rfl: 1   pregabalin (LYRICA) 50 MG capsule, Take 50 mg by mouth 3 (three) times daily., Disp: , Rfl:    acidophilus (RISAQUAD) CAPS capsule, Take 1 capsule by mouth daily. (Patient not taking: Reported on 07/10/2024), Disp: , Rfl:    triamcinolone  ointment (KENALOG ) 0.5 %, Apply topically twice daily for up to 7 days as needed for vulvar itching. (Patient taking differently: Apply 1 Application topically as needed. Apply topically twice daily for up to 7 days as needed for vulvar itching.), Disp: 90 g, Rfl: 1 Allergies  Allergen Reactions   Elemental Sulfur Hives     ROS: A complete ROS was performed with pertinent positives/negatives noted in the HPI. The remainder of the ROS are negative.    Objective:   Today's Vitals   07/10/24 0957  BP: 134/80  Pulse: (!) 57  Temp: 97.7 F (36.5 C)  TempSrc: Temporal  SpO2: 99%  Weight: 136 lb (61.7 kg)  Height: 5' 2 (1.575 m)    GENERAL: Well-appearing, in NAD. Well nourished.  SKIN: Pink, warm and dry. Blister with small amount of clear fluid to dorsal aspect of R. Pinky toe and base of right 4th toe RESPIRATORY: Chest wall symmetrical. Respirations even and non-labored.  MSK: Muscle tone and strength appropriate for age.  EXTREMITIES: Without clubbing, cyanosis, or edema.  PSYCH/MENTAL STATUS: Alert,  oriented x 3. Cooperative, appropriate mood and affect.    No results found for any visits on 07/10/24.    Assessment & Plan:  Assessment and Plan    Blister of right toe Blister on right little toe spread to adjacent toe, filled with clear fluid, sore but not infected.  Likely friction blister, unlikely viral cause. No infection or systemic involvement. Patient is going out of town and concern for worsening symptoms or infection.  - Prescribed mupirocin ointment twice daily  - Advised Epsom salt soaks 10-15 minutes - Instructed to leave blister open and avoid rubbing. - Recommended foot elevation to reduce swelling. - Prescribed Keflex . Patient advised to watch and wait, if condition worsens then start antibiotics - Suggested blister Band-Aids for cushioning when wearing closed-toe shoes.     Meds ordered this encounter  Medications   cephALEXin  (KEFLEX ) 500 MG capsule    Sig: Take 1 capsule (500 mg total) by mouth 4 (four) times daily for 5 days.    Dispense:  20 capsule    Refill:  0    Supervising Provider:   THOMPSON, AARON B [[id number]]   mupirocin ointment (BACTROBAN) 2 %    Sig: Apply 1 Application topically 2 (two) times daily.    Dispense:  22 g    Refill:  1    Supervising Provider:   SEBASTIAN BEVERLEY NOVAK [[id number]]   No orders of the defined types were placed in this encounter.  Lab Orders  No laboratory test(s) ordered today   No images are attached to the encounter or orders placed in the encounter.  Return if symptoms worsen or fail to improve.   Rosina Senters, FNP

## 2024-07-28 DIAGNOSIS — M419 Scoliosis, unspecified: Secondary | ICD-10-CM | POA: Diagnosis not present

## 2024-07-28 DIAGNOSIS — M546 Pain in thoracic spine: Secondary | ICD-10-CM | POA: Diagnosis not present

## 2024-07-28 DIAGNOSIS — M5416 Radiculopathy, lumbar region: Secondary | ICD-10-CM | POA: Diagnosis not present

## 2024-08-19 DIAGNOSIS — M5416 Radiculopathy, lumbar region: Secondary | ICD-10-CM | POA: Diagnosis not present

## 2024-10-05 ENCOUNTER — Encounter: Payer: Self-pay | Admitting: *Deleted

## 2024-11-17 ENCOUNTER — Encounter (INDEPENDENT_AMBULATORY_CARE_PROVIDER_SITE_OTHER): Admitting: Ophthalmology

## 2024-11-20 ENCOUNTER — Encounter (INDEPENDENT_AMBULATORY_CARE_PROVIDER_SITE_OTHER): Admitting: Ophthalmology

## 2024-12-01 ENCOUNTER — Ambulatory Visit: Admitting: Nurse Practitioner

## 2024-12-15 ENCOUNTER — Ambulatory Visit (HOSPITAL_BASED_OUTPATIENT_CLINIC_OR_DEPARTMENT_OTHER): Admitting: Obstetrics & Gynecology
